# Patient Record
Sex: Male | Born: 1935 | Race: White | Hispanic: No | Marital: Married | Smoking: Former smoker
Health system: Southern US, Community
[De-identification: ages and names within clinical notes are randomized; demographics above are authoritative.]

## PROBLEM LIST (undated history)

## (undated) DIAGNOSIS — Z9889 Other specified postprocedural states: Secondary | ICD-10-CM

## (undated) DIAGNOSIS — E785 Hyperlipidemia, unspecified: Secondary | ICD-10-CM

## (undated) DIAGNOSIS — I82401 Acute embolism and thrombosis of unspecified deep veins of right lower extremity: Secondary | ICD-10-CM

## (undated) DIAGNOSIS — M1990 Unspecified osteoarthritis, unspecified site: Secondary | ICD-10-CM

## (undated) DIAGNOSIS — K219 Gastro-esophageal reflux disease without esophagitis: Secondary | ICD-10-CM

## (undated) DIAGNOSIS — J302 Other seasonal allergic rhinitis: Secondary | ICD-10-CM

## (undated) DIAGNOSIS — I10 Essential (primary) hypertension: Secondary | ICD-10-CM

## (undated) DIAGNOSIS — H3530 Unspecified macular degeneration: Secondary | ICD-10-CM

## (undated) DIAGNOSIS — M48061 Spinal stenosis, lumbar region without neurogenic claudication: Secondary | ICD-10-CM

## (undated) DIAGNOSIS — I6529 Occlusion and stenosis of unspecified carotid artery: Secondary | ICD-10-CM

## (undated) DIAGNOSIS — R112 Nausea with vomiting, unspecified: Secondary | ICD-10-CM

## (undated) DIAGNOSIS — N400 Enlarged prostate without lower urinary tract symptoms: Secondary | ICD-10-CM

## (undated) DIAGNOSIS — Z974 Presence of external hearing-aid: Secondary | ICD-10-CM

## (undated) DIAGNOSIS — I639 Cerebral infarction, unspecified: Secondary | ICD-10-CM

## (undated) DIAGNOSIS — M5126 Other intervertebral disc displacement, lumbar region: Secondary | ICD-10-CM

## (undated) HISTORY — PX: TONSILLECTOMY: SUR1361

## (undated) HISTORY — PX: ROTATOR CUFF REPAIR: SHX139

## (undated) HISTORY — PX: BACK SURGERY: SHX140

## (undated) HISTORY — PX: HERNIA REPAIR: SHX51

## (undated) HISTORY — PX: EYE SURGERY: SHX253

## (undated) HISTORY — PX: COLONOSCOPY: SHX174

---

## 2000-07-27 ENCOUNTER — Encounter (INDEPENDENT_AMBULATORY_CARE_PROVIDER_SITE_OTHER): Payer: Self-pay | Admitting: Specialist

## 2000-07-27 ENCOUNTER — Ambulatory Visit (HOSPITAL_COMMUNITY): Admission: RE | Admit: 2000-07-27 | Discharge: 2000-07-27 | Payer: Self-pay | Admitting: Gastroenterology

## 2003-11-28 ENCOUNTER — Ambulatory Visit (HOSPITAL_COMMUNITY): Admission: RE | Admit: 2003-11-28 | Discharge: 2003-11-30 | Payer: Self-pay | Admitting: Orthopaedic Surgery

## 2004-08-23 ENCOUNTER — Emergency Department (HOSPITAL_COMMUNITY): Admission: EM | Admit: 2004-08-23 | Discharge: 2004-08-23 | Payer: Self-pay | Admitting: Emergency Medicine

## 2006-05-08 ENCOUNTER — Emergency Department (HOSPITAL_COMMUNITY): Admission: EM | Admit: 2006-05-08 | Discharge: 2006-05-08 | Payer: Self-pay | Admitting: Emergency Medicine

## 2007-05-04 ENCOUNTER — Ambulatory Visit (HOSPITAL_COMMUNITY): Admission: RE | Admit: 2007-05-04 | Discharge: 2007-05-04 | Payer: Self-pay | Admitting: Orthopaedic Surgery

## 2007-05-17 ENCOUNTER — Encounter: Admission: RE | Admit: 2007-05-17 | Discharge: 2007-05-17 | Payer: Self-pay | Admitting: Orthopaedic Surgery

## 2007-05-31 ENCOUNTER — Encounter: Admission: RE | Admit: 2007-05-31 | Discharge: 2007-05-31 | Payer: Self-pay | Admitting: Orthopaedic Surgery

## 2008-06-26 ENCOUNTER — Encounter: Admission: RE | Admit: 2008-06-26 | Discharge: 2008-06-26 | Payer: Self-pay | Admitting: Orthopaedic Surgery

## 2008-07-10 ENCOUNTER — Encounter: Admission: RE | Admit: 2008-07-10 | Discharge: 2008-07-10 | Payer: Self-pay | Admitting: Orthopaedic Surgery

## 2010-05-27 ENCOUNTER — Emergency Department (HOSPITAL_COMMUNITY): Admission: EM | Admit: 2010-05-27 | Discharge: 2010-05-27 | Payer: Self-pay | Admitting: Emergency Medicine

## 2011-02-14 ENCOUNTER — Other Ambulatory Visit: Payer: Self-pay | Admitting: Urology

## 2011-02-14 DIAGNOSIS — N644 Mastodynia: Secondary | ICD-10-CM

## 2011-02-17 ENCOUNTER — Ambulatory Visit
Admission: RE | Admit: 2011-02-17 | Discharge: 2011-02-17 | Disposition: A | Discharge status: Home / Self Care | Payer: Medicare Other | Source: Ambulatory Visit | Attending: Urology | Admitting: Urology

## 2011-02-17 ENCOUNTER — Other Ambulatory Visit: Payer: Self-pay | Admitting: Urology

## 2011-02-17 DIAGNOSIS — N644 Mastodynia: Secondary | ICD-10-CM

## 2011-03-07 LAB — DIFFERENTIAL
Basophils Absolute: 0.1 10*3/uL (ref 0.0–0.1)
Basophils Relative: 0 % (ref 0–1)
Eosinophils Absolute: 0.1 10*3/uL (ref 0.0–0.7)
Eosinophils Relative: 1 % (ref 0–5)
Lymphocytes Relative: 8 % — ABNORMAL LOW (ref 12–46)
Lymphs Abs: 1.4 10*3/uL (ref 0.7–4.0)
Monocytes Absolute: 0.9 10*3/uL (ref 0.1–1.0)
Monocytes Relative: 5 % (ref 3–12)
Neutro Abs: 14.9 10*3/uL — ABNORMAL HIGH (ref 1.7–7.7)
Neutrophils Relative %: 86 % — ABNORMAL HIGH (ref 43–77)

## 2011-03-07 LAB — URINALYSIS, ROUTINE W REFLEX MICROSCOPIC
Bilirubin Urine: NEGATIVE
Glucose, UA: NEGATIVE mg/dL
Hgb urine dipstick: NEGATIVE
Ketones, ur: NEGATIVE mg/dL
Nitrite: NEGATIVE
Protein, ur: NEGATIVE mg/dL
Specific Gravity, Urine: 1.01 (ref 1.005–1.030)
Urobilinogen, UA: 0.2 mg/dL (ref 0.0–1.0)
pH: 6.5 (ref 5.0–8.0)

## 2011-03-07 LAB — POCT I-STAT, CHEM 8
BUN: 14 mg/dL (ref 6–23)
Calcium, Ion: 1.12 mmol/L (ref 1.12–1.32)
Chloride: 105 mEq/L (ref 96–112)
Creatinine, Ser: 0.8 mg/dL (ref 0.4–1.5)
Glucose, Bld: 91 mg/dL (ref 70–99)
HCT: 48 % (ref 39.0–52.0)
Hemoglobin: 16.3 g/dL (ref 13.0–17.0)
Potassium: 3.7 mEq/L (ref 3.5–5.1)
Sodium: 139 mEq/L (ref 135–145)
TCO2: 28 mmol/L (ref 0–100)

## 2011-03-07 LAB — CBC
HCT: 45 % (ref 39.0–52.0)
Hemoglobin: 15.5 g/dL (ref 13.0–17.0)
MCHC: 34.4 g/dL (ref 30.0–36.0)
MCV: 97.6 fL (ref 78.0–100.0)
Platelets: 256 10*3/uL (ref 150–400)
RBC: 4.61 MIL/uL (ref 4.22–5.81)
RDW: 12.9 % (ref 11.5–15.5)
WBC: 17.3 10*3/uL — ABNORMAL HIGH (ref 4.0–10.5)

## 2011-03-07 LAB — URINE MICROSCOPIC-ADD ON

## 2011-05-06 NOTE — Op Note (Signed)
Jay Tran, Jay Tran                           ACCOUNT NO.:  [id number]   MEDICAL RECORD NO.:  [id number]                   PATIENT TYPE:  OIB   LOCATION:  2899                                 FACILITY:  [facility]   PHYSICIAN:  Mark C. Ophelia Charter, M.D.                 DATE OF BIRTH:  1934/04/16   DATE OF PROCEDURE:  11/28/2003  DATE OF DISCHARGE:                                 OPERATIVE REPORT   PREOPERATIVE DIAGNOSIS:  Status post right rotator cuff repair with  infection.   PROCEDURE:  Right shoulder exploration, debridement of skin and subcutaneous  tissue, muscle, and bone.   SURGEON:  Mark C. Ophelia Charter, M.D.   ANESTHESIA:  GOT.   ESTIMATED BLOOD LOSS:  Less than 5 mL.   INDICATIONS FOR PROCEDURE:  This 77 year old male is status post rotator  cuff repair on October 27, 2003.  He originally tore his right shoulder  rotator cuff about 10 years ago, did well for a period of a year or two,  then had a repeat tear with accidental episode where he tried to grab a  large appliance as it was falling.  He had a repeat repair and had done well  up until 2004 when he had increased shoulder pain and MRI scan showed a  recurrent tear.  During his third repair which was on October 27, 2003,  after the repair was performed, the tissue patch was placed over the top.  In the postoperative period at three weeks out, he came in with some  prominence and puffiness of the incision.  Aspiration was performed.  Gram  stain was negative.  Cultures at 48 hours were negative.  He was placed on  Keflex.  He presented four days later with recurrence of golfball size  swelling and purulent material on aspiration.   DESCRIPTION OF PROCEDURE:  After induction of general anesthesia with the  patient in the Schlein shoulder holder frame, prepping with Duraprep with  the usual impervious stockinette, split sheets and drapes were applied.  Old  incision was opened and subcutaneous tissue immediately had some  purulent  serosanguineous type fluid with a little bit of subcutaneous necrosis which  appeared to be low grade infection. Deltoid was intact and after irrigation,  the repair of the deltoid to the acromion which was placed through drill  holes was carefully inspected.  The shoulder was rotated and there was no  drainage noted.  The sutures were cut loose from the deltoid and immediately  underneath this, there was again some slightly purulent material.  Weitlaner  was placed and inspection on top of the rotator cuff did not show the  previous tissue mend that had been sutured on top.  It had been placed with  Vicryl sutures and there appeared to be sort of a filmy layer over the top  of the rotator cuff that was scraped with a  curet and it appeared that some  of the pieces were tissue mend and these were sent for cultures.  Pulsatile  irrigation was then used.  The shoulder was rotated.  There was one single  area where there was a tear that was about 2 cm and sutures were present,  but the rotator cuff had pulled away from where the position had been  sutured down to the bone through a small trough and holes in the cortex.  These sutures were cut loose since there was a gap between the rotator cuff  and the tissue.  The joint was inspected with [employer] and there was no  purulent material.  The shoulder was taken through range of motion and  suctioning and with no infection evident inside the glenohumeral joint,  pulsatile lavage was used inside the joint.  Cartilage appeared normal.  With the possibility that the patient may have infection, repeat repair of  the rotator cuff was deferred.  3 liters of pulsatile lavage was used.  Hemovac  drain was placed and two simple Vicryl sutures were used to pull the deltoid  up to the acromion to keep it from retracting too severely.  Skin staple  closure, Adaptic, 4x4's, ABD, and tape.  Shoulder sling immobilizer was  applied and the patient was  transferred to the recovery room.                                               Mark C. Ophelia Charter, M.D.    MCY/MEDQ  D:  11/28/2003  T:  11/29/2003  Job:  [id number]

## 2011-05-06 NOTE — Procedures (Signed)
[facility]  Patient:    Jay Tran, Jay Tran                          MRN: [id number] Proc. Date: 07/27/00 Attending:  Verlin Tran, M.D. CC:         Jay Tran, M.D., [facility]  ,                           Procedure Report  REFERRING PHYSICIAN:  Hadassah Pais. Jeannetta Tran, M.D.  PROCEDURE:  Colonoscopy.  ENDOSCOPIST:  Jay Tran, M.D.  INDICATIONS FOR PROCEDURE:  The patient is a 77 year old male.  He submitted stool cards for Hemoccult testing to Dr. Windle Guard on June 23, 2000.  One out of six stool Hemoccult cards was positive for blood.  The patient is referred for diagnostic colonoscopy.  I discussed with the patient the complications associated with colonoscopy and polypectomy including intestinal bleeding and intestinal perforation.  The patient has signed the operative permit.  MEDICATION ALLERGIES:  None.  CHRONIC MEDICATIONS:  Hydrochlorothiazide and Zantac.  PAST MEDICAL HISTORY:  Hypertension, hypercholesterolemia, rotator cuff surgery, and herniorrhaphy.  FAMILY HISTORY:  Father died at age 40 of prostate cancer.  Mother died at age 39, stroke.  The patient has a brother 57 with bladder cancer.  PREMEDICATION:  Demerol 50 mg and Versed 5 mg.  ENDOSCOPE:  Olympus pediatric colonoscope.  DESCRIPTION OF PROCEDURE:  After obtaining informed consent, the patient was placed in the left lateral decubitus position.  I administered intravenous Demerol and intravenous Versed to achieve conscious sedation for the procedure.  The patients blood pressure, oxygen saturation, and cardiac rhythm were monitored throughout the procedure and documented in the medical record.  Anal inspection was normal.  Digital rectal exam revealed a non-nodular prostate.  The Olympus pediatric video colonoscope was introduced into the rectum and under direct vision advanced to the cecum as identified by a normal-appearing ileocecal valve.   Colonic preparation for the exam today was excellent.  The patient has universal colonic diverticulosis.  There is no evidence of diverticulitis.  Rectum normal.  Sigmoid colon and descending colon:  At 20 cm from the anal verge, a 0.5 mm sessile polyp was removed with the cold biopsy forceps and submitted for pathologic interpretation.  Splenic flexure normal.  Transverse colon normal.  Hepatic flexure normal.  Ascending colon normal.  Cecum and ileocecal valve normal.  ASSESSMENT: 1. Universal colonic diverticulosis. 2. At 20 cm from the anal verge, in the distal sigmoid colon, a 0.5 mm sessile    polyp was removed with the cold biopsy forceps.  RECOMMENDATIONS:  If the patients distal sigmoid colon polyp is neoplastic, the patient should undergo a repeat colonoscopy in five years; if the distal sigmoid colon polyp is nonneoplastic, he should undergo a repeat colonoscopy in 10 years. DD:  07/27/00 TD:  07/28/00 Job: [id number]

## 2012-05-11 ENCOUNTER — Other Ambulatory Visit: Payer: Self-pay | Admitting: Urology

## 2012-05-11 DIAGNOSIS — N62 Hypertrophy of breast: Secondary | ICD-10-CM

## 2012-05-17 ENCOUNTER — Ambulatory Visit
Admission: RE | Admit: 2012-05-17 | Discharge: 2012-05-17 | Disposition: A | Discharge status: Home / Self Care | Payer: Medicare Other | Source: Ambulatory Visit | Attending: Urology | Admitting: Urology

## 2012-05-17 DIAGNOSIS — N62 Hypertrophy of breast: Secondary | ICD-10-CM

## 2012-05-21 ENCOUNTER — Other Ambulatory Visit: Payer: Self-pay | Admitting: Neurology

## 2012-05-21 DIAGNOSIS — M21371 Foot drop, right foot: Secondary | ICD-10-CM

## 2012-05-21 DIAGNOSIS — M545 Low back pain, unspecified: Secondary | ICD-10-CM

## 2012-05-21 DIAGNOSIS — M25559 Pain in unspecified hip: Secondary | ICD-10-CM

## 2012-05-24 ENCOUNTER — Ambulatory Visit
Admission: RE | Admit: 2012-05-24 | Discharge: 2012-05-24 | Disposition: A | Discharge status: Home / Self Care | Payer: Medicare Other | Source: Ambulatory Visit | Attending: Neurology | Admitting: Neurology

## 2012-05-24 DIAGNOSIS — M25559 Pain in unspecified hip: Secondary | ICD-10-CM

## 2012-05-24 DIAGNOSIS — M545 Low back pain, unspecified: Secondary | ICD-10-CM

## 2012-05-24 DIAGNOSIS — M21371 Foot drop, right foot: Secondary | ICD-10-CM

## 2012-10-11 ENCOUNTER — Encounter (HOSPITAL_COMMUNITY): Payer: Self-pay | Admitting: Pharmacy Technician

## 2012-10-15 ENCOUNTER — Other Ambulatory Visit (HOSPITAL_COMMUNITY): Payer: Self-pay | Admitting: Orthopaedic Surgery

## 2012-10-17 ENCOUNTER — Encounter (HOSPITAL_COMMUNITY): Payer: Self-pay

## 2012-10-17 ENCOUNTER — Encounter (HOSPITAL_COMMUNITY)
Admission: RE | Admit: 2012-10-17 | Discharge: 2012-10-17 | Disposition: A | Discharge status: Home / Self Care | Payer: Medicare Other | Source: Ambulatory Visit | Attending: Orthopaedic Surgery | Admitting: Orthopaedic Surgery

## 2012-10-17 ENCOUNTER — Ambulatory Visit (HOSPITAL_COMMUNITY)
Admission: RE | Admit: 2012-10-17 | Discharge: 2012-10-17 | Disposition: A | Discharge status: Home / Self Care | Payer: Medicare Other | Source: Ambulatory Visit | Attending: Orthopaedic Surgery | Admitting: Orthopaedic Surgery

## 2012-10-17 DIAGNOSIS — Z01818 Encounter for other preprocedural examination: Secondary | ICD-10-CM | POA: Insufficient documentation

## 2012-10-17 DIAGNOSIS — Z01810 Encounter for preprocedural cardiovascular examination: Secondary | ICD-10-CM | POA: Insufficient documentation

## 2012-10-17 DIAGNOSIS — Z01812 Encounter for preprocedural laboratory examination: Secondary | ICD-10-CM | POA: Insufficient documentation

## 2012-10-17 HISTORY — DX: Essential (primary) hypertension: I10

## 2012-10-17 HISTORY — DX: Unspecified osteoarthritis, unspecified site: M19.90

## 2012-10-17 HISTORY — DX: Benign prostatic hyperplasia without lower urinary tract symptoms: N40.0

## 2012-10-17 HISTORY — DX: Other specified postprocedural states: Z98.890

## 2012-10-17 HISTORY — DX: Nausea with vomiting, unspecified: R11.2

## 2012-10-17 HISTORY — DX: Hyperlipidemia, unspecified: E78.5

## 2012-10-17 LAB — CBC
HCT: 43.1 % (ref 39.0–52.0)
Hemoglobin: 15.1 g/dL (ref 13.0–17.0)
MCH: 32.3 pg (ref 26.0–34.0)
MCHC: 35 g/dL (ref 30.0–36.0)
MCV: 92.3 fL (ref 78.0–100.0)
Platelets: 237 10*3/uL (ref 150–400)
RBC: 4.67 MIL/uL (ref 4.22–5.81)
RDW: 12.7 % (ref 11.5–15.5)
WBC: 7.6 10*3/uL (ref 4.0–10.5)

## 2012-10-17 LAB — SURGICAL PCR SCREEN
MRSA, PCR: NEGATIVE
Staphylococcus aureus: NEGATIVE

## 2012-10-17 LAB — COMPREHENSIVE METABOLIC PANEL
ALT: <5 U/L (ref 0–53)
AST: 22 U/L (ref 0–37)
Albumin: 4.2 g/dL (ref 3.5–5.2)
Alkaline Phosphatase: 45 U/L (ref 39–117)
BUN: 18 mg/dL (ref 6–23)
CO2: 28 mEq/L (ref 19–32)
Calcium: 9.4 mg/dL (ref 8.4–10.5)
Chloride: 98 mEq/L (ref 96–112)
Creatinine, Ser: 1.02 mg/dL (ref 0.50–1.35)
GFR calc Af Amer: 80 mL/min — ABNORMAL LOW (ref >90)
GFR calc non Af Amer: 69 mL/min — ABNORMAL LOW (ref >90)
Glucose, Bld: 88 mg/dL (ref 70–99)
Potassium: 3.8 mEq/L (ref 3.5–5.1)
Sodium: 135 mEq/L (ref 135–145)
Total Bilirubin: 0.8 mg/dL (ref 0.3–1.2)
Total Protein: 7.4 g/dL (ref 6.0–8.3)

## 2012-10-17 LAB — APTT: aPTT: 35 seconds (ref 24–37)

## 2012-10-17 LAB — PROTIME-INR
INR: 0.98 (ref 0.00–1.49)
Prothrombin Time: 12.9 seconds (ref 11.6–15.2)

## 2012-10-17 NOTE — Pre-Procedure Instructions (Signed)
[address] Deeney Mellone  10/17/2012   Your procedure is scheduled on:  Friday, November 8th  Report to [facility] Stay [facility] at 0530 AM.  Call this number if you have problems the morning of surgery: [phone]   Remember:   Do not eat food or drink:After Midnight.   Take these medicines the morning of surgery with A SIP OF WATER: proscar, flomax   Do not wear jewelry, make-up or nail polish.  Do not wear lotions, powders, or perfumes. .  Do not shave 48 hours prior to surgery. Men may shave face and neck.  Do not bring valuables to the hospital.  Contacts, dentures or bridgework may not be worn into surgery.  Leave suitcase in the car. After surgery it may be brought to your room.  For patients admitted to the hospital, checkout time is 11:00 AM the day of discharge.   Patients discharged the day of surgery will not be allowed to drive home.   Special Instructions: Shower using CHG 2 nights before surgery and the night before surgery.  If you shower the day of surgery use CHG.  Use special wash - you have one bottle of CHG for all showers.  You should use approximately 1/3 of the bottle for each shower.   Please read over the following fact sheets that you were given: Pain Booklet, Coughing and Deep Breathing, MRSA Information and Surgical Site Infection Prevention

## 2012-10-17 NOTE — Progress Notes (Signed)
Primary Physician - Dr. Jeannetta Nap - [place] Does not have a cardiologist - No previous cardiac work-up

## 2012-10-18 NOTE — Consult Note (Signed)
Anesthesia chart review: Patient is a 78 -year-old male scheduled for right lumbar 45 microdiscectomy on 10/26/2012 by Dr. Ophelia Charter. History includes postoperative nausea vomiting, arthritis, hyperlipidemia, BPH, hypertension, tonsillectomy, right rotator cuff repair '04, former smoker. PCP is Dr. Jeannetta Nap.  Labs noted.  Chest x-ray on 10/17/2012 showed no evidence of acute cardiopulmonary disease.  EKG on 10/17/2012 showed normal sinus rhythm, incomplete right bundle branch block. Currently there no comparison EKGs.  No CV symptoms documented at his PAT visit.  If remains asymptomatic then anticipate he can proceed as planned.  [name], PA-C

## 2012-10-25 MED ORDER — CEFAZOLIN SODIUM-DEXTROSE 2-3 GM-% IV SOLR
2.0000 g | INTRAVENOUS | Status: AC
  Administered 2012-10-26: 2 g via INTRAVENOUS
  Filled 2012-10-25: qty 50

## 2012-10-25 NOTE — H&P (Signed)
Jay Tran is an 78 y.o. male.   Chief Complaint: right leg pain and weakness, back pain HPI:Pt with several months of progressive back and leg left pain.  Previous ESI and NSAID treatment without relief of his symptoms   MRI scan previously showed broad based disk protrusion with lateral recess stenosis on the left consistent with his symptoms.  He has had at least 5 epidurals.  He has had radiofrequency ablation. will proceed with right L4-5 microdiscectomy with right lateral recess stenosis as he has failed conservative treatment.   Past Medical History  Diagnosis Date  . PONV (postoperative nausea and vomiting)   . Hypertension   . BPH (benign prostatic hypertrophy)   . Arthritis   . Hyperlipemia     Past Surgical History  Procedure Date  . Rotator cuff repair     right shoulder - 3 times  . Tonsillectomy     No family history on file. Social History:  reports that he has quit smoking. He does not have any smokeless tobacco history on file. He reports that he does not drink alcohol or use illicit drugs.  Allergies: No Known Allergies  No prescriptions prior to admission    No results found for this or any previous visit (from the past 48 hour(s)). No results found.  ROS  There were no vitals taken for this visit. Physical Exam  Constitutional: He is oriented to person, place, and time. He appears well-developed and well-nourished.  HENT:  Head: Normocephalic and atraumatic.  Eyes: EOM are normal. Pupils are equal, round, and reactive to light.  Neck: Normal range of motion. Neck supple.  Cardiovascular: Normal rate.   Respiratory: Effort normal.  GI: Soft.  Musculoskeletal:       PHYSICAL EXAMINATION:  Manual motor testing, anterior tib test strong.  When he walks he is not able to walk on his heels due to anterior tib weakness.  There is slight anterior compartment atrophy.  Decreased sensation over the dorsum of the foot.  He has sciatic notch tenderness.  Pain  with straight leg raising at 80 degrees on the right, negative on the left.    Neurological: He is alert and oriented to person, place, and time.  Skin: Skin is warm and dry.  Psychiatric: He has a normal mood and affect.     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  Micheil Klaus M 10/25/2012, 12:18 PM

## 2012-10-26 ENCOUNTER — Encounter (HOSPITAL_COMMUNITY)
Admission: RE | Disposition: A | Discharge status: Home / Self Care | Payer: Self-pay | Source: Ambulatory Visit | Attending: Orthopaedic Surgery

## 2012-10-26 ENCOUNTER — Ambulatory Visit (HOSPITAL_COMMUNITY): Payer: Medicare Other

## 2012-10-26 ENCOUNTER — Encounter (HOSPITAL_COMMUNITY): Payer: Self-pay | Admitting: Vascular Surgery

## 2012-10-26 ENCOUNTER — Encounter (HOSPITAL_COMMUNITY): Payer: Self-pay | Admitting: *Deleted

## 2012-10-26 ENCOUNTER — Ambulatory Visit (HOSPITAL_COMMUNITY): Payer: Medicare Other | Admitting: Vascular Surgery

## 2012-10-26 ENCOUNTER — Ambulatory Visit (HOSPITAL_COMMUNITY)
Admission: RE | Admit: 2012-10-26 | Discharge: 2012-10-27 | Disposition: A | Discharge status: Home / Self Care | Payer: Medicare Other | Source: Ambulatory Visit | Attending: Orthopaedic Surgery | Admitting: Orthopaedic Surgery

## 2012-10-26 DIAGNOSIS — E785 Hyperlipidemia, unspecified: Secondary | ICD-10-CM | POA: Insufficient documentation

## 2012-10-26 DIAGNOSIS — I10 Essential (primary) hypertension: Secondary | ICD-10-CM | POA: Insufficient documentation

## 2012-10-26 DIAGNOSIS — N400 Enlarged prostate without lower urinary tract symptoms: Secondary | ICD-10-CM | POA: Insufficient documentation

## 2012-10-26 DIAGNOSIS — M129 Arthropathy, unspecified: Secondary | ICD-10-CM | POA: Insufficient documentation

## 2012-10-26 DIAGNOSIS — M5126 Other intervertebral disc displacement, lumbar region: Secondary | ICD-10-CM | POA: Diagnosis present

## 2012-10-26 HISTORY — PX: LUMBAR LAMINECTOMY: SHX95

## 2012-10-26 SURGERY — MICRODISCECTOMY LUMBAR LAMINECTOMY
Anesthesia: General | Site: Back | Laterality: Right | Wound class: Clean

## 2012-10-26 MED ORDER — METHOCARBAMOL 500 MG PO TABS: 500.0000 mg | ORAL_TABLET | Freq: Four times a day (QID) | ORAL | Status: DC

## 2012-10-26 MED ORDER — PANTOPRAZOLE SODIUM 40 MG IV SOLR
40.0000 mg | Freq: Every day | INTRAVENOUS | Status: DC
  Administered 2012-10-26: 40 mg via INTRAVENOUS
  Filled 2012-10-26 (×2): qty 40

## 2012-10-26 MED ORDER — NEOSTIGMINE METHYLSULFATE 1 MG/ML IJ SOLN
INTRAMUSCULAR | Status: DC | PRN
  Administered 2012-10-26: 2 mg via INTRAVENOUS
  Administered 2012-10-26: 3 mg via INTRAVENOUS

## 2012-10-26 MED ORDER — MORPHINE SULFATE 2 MG/ML IJ SOLN: 1.0000 mg | INTRAMUSCULAR | Status: DC | PRN

## 2012-10-26 MED ORDER — KETOROLAC TROMETHAMINE 30 MG/ML IJ SOLN
15.0000 mg | Freq: Three times a day (TID) | INTRAMUSCULAR | Status: AC
  Administered 2012-10-26 – 2012-10-27 (×3): 15 mg via INTRAVENOUS
  Filled 2012-10-26 (×2): qty 1

## 2012-10-26 MED ORDER — FENTANYL CITRATE 0.05 MG/ML IJ SOLN
INTRAMUSCULAR | Status: DC | PRN
  Administered 2012-10-26: 100 ug via INTRAVENOUS

## 2012-10-26 MED ORDER — HYDROCHLOROTHIAZIDE 25 MG PO TABS
25.0000 mg | ORAL_TABLET | Freq: Every day | ORAL | Status: DC
  Administered 2012-10-27: 25 mg via ORAL
  Filled 2012-10-26: qty 1

## 2012-10-26 MED ORDER — KETOROLAC TROMETHAMINE 30 MG/ML IJ SOLN
INTRAMUSCULAR | Status: AC
  Filled 2012-10-26: qty 1

## 2012-10-26 MED ORDER — MIDAZOLAM HCL 5 MG/5ML IJ SOLN
INTRAMUSCULAR | Status: DC | PRN
  Administered 2012-10-26: 2 mg via INTRAVENOUS

## 2012-10-26 MED ORDER — FINASTERIDE 5 MG PO TABS
5.0000 mg | ORAL_TABLET | ORAL | Status: DC
  Filled 2012-10-26: qty 1

## 2012-10-26 MED ORDER — DEXAMETHASONE SODIUM PHOSPHATE 4 MG/ML IJ SOLN
INTRAMUSCULAR | Status: DC | PRN
  Administered 2012-10-26: 10 mg via INTRAVENOUS

## 2012-10-26 MED ORDER — ONDANSETRON HCL 4 MG/2ML IJ SOLN
INTRAMUSCULAR | Status: DC | PRN
  Administered 2012-10-26: 4 mg via INTRAVENOUS

## 2012-10-26 MED ORDER — SCOPOLAMINE 1 MG/3DAYS TD PT72
MEDICATED_PATCH | TRANSDERMAL | Status: AC
  Filled 2012-10-26: qty 1

## 2012-10-26 MED ORDER — GLYCOPYRROLATE 0.2 MG/ML IJ SOLN
INTRAMUSCULAR | Status: DC | PRN
  Administered 2012-10-26: .6 mg via INTRAVENOUS
  Administered 2012-10-26: 0.2 mg via INTRAVENOUS
  Administered 2012-10-26: 0.4 mg via INTRAVENOUS
  Administered 2012-10-26: 0.2 mg via INTRAVENOUS

## 2012-10-26 MED ORDER — BUPIVACAINE HCL (PF) 0.25 % IJ SOLN
INTRAMUSCULAR | Status: AC
  Filled 2012-10-26: qty 30

## 2012-10-26 MED ORDER — METHOCARBAMOL 500 MG PO TABS
500.0000 mg | ORAL_TABLET | Freq: Four times a day (QID) | ORAL | Status: DC | PRN
  Administered 2012-10-26 – 2012-10-27 (×4): 500 mg via ORAL
  Filled 2012-10-26 (×4): qty 1

## 2012-10-26 MED ORDER — BISACODYL 10 MG RE SUPP: 10.0000 mg | Freq: Every day | RECTAL | Status: DC | PRN

## 2012-10-26 MED ORDER — EPHEDRINE SULFATE 50 MG/ML IJ SOLN
INTRAMUSCULAR | Status: DC | PRN
  Administered 2012-10-26: 10 mg via INTRAVENOUS

## 2012-10-26 MED ORDER — ACETAMINOPHEN 325 MG PO TABS: 650.0000 mg | ORAL_TABLET | ORAL | Status: DC | PRN

## 2012-10-26 MED ORDER — LACTATED RINGERS IV SOLN
INTRAVENOUS | Status: DC | PRN
  Administered 2012-10-26 (×2): via INTRAVENOUS

## 2012-10-26 MED ORDER — SODIUM CHLORIDE 0.9 % IJ SOLN: 3.0000 mL | INTRAMUSCULAR | Status: DC | PRN

## 2012-10-26 MED ORDER — KCL IN DEXTROSE-NACL 20-5-0.45 MEQ/L-%-% IV SOLN
INTRAVENOUS | Status: DC
  Administered 2012-10-26 (×2): via INTRAVENOUS
  Filled 2012-10-26 (×3): qty 1000

## 2012-10-26 MED ORDER — ACETAMINOPHEN 650 MG RE SUPP: 650.0000 mg | RECTAL | Status: DC | PRN

## 2012-10-26 MED ORDER — PHENOL 1.4 % MT LIQD: 1.0000 | OROMUCOSAL | Status: DC | PRN

## 2012-10-26 MED ORDER — ZOLPIDEM TARTRATE 5 MG PO TABS: 5.0000 mg | ORAL_TABLET | Freq: Every evening | ORAL | Status: DC | PRN

## 2012-10-26 MED ORDER — CEFAZOLIN SODIUM 1-5 GM-% IV SOLN
1.0000 g | Freq: Three times a day (TID) | INTRAVENOUS | Status: AC
  Administered 2012-10-26 (×2): 1 g via INTRAVENOUS
  Filled 2012-10-26 (×2): qty 50

## 2012-10-26 MED ORDER — METHOCARBAMOL 100 MG/ML IJ SOLN
500.0000 mg | Freq: Once | INTRAVENOUS | Status: AC
  Administered 2012-10-26: 500 mg via INTRAVENOUS
  Filled 2012-10-26: qty 5

## 2012-10-26 MED ORDER — HYDROMORPHONE HCL PF 1 MG/ML IJ SOLN
INTRAMUSCULAR | Status: AC
  Filled 2012-10-26: qty 1

## 2012-10-26 MED ORDER — HYDROCODONE-ACETAMINOPHEN 5-325 MG PO TABS
1.0000 | ORAL_TABLET | ORAL | Status: DC | PRN
  Administered 2012-10-26 – 2012-10-27 (×4): 1 via ORAL
  Filled 2012-10-26 (×3): qty 1
  Filled 2012-10-26: qty 2

## 2012-10-26 MED ORDER — PROPOFOL 10 MG/ML IV BOLUS
INTRAVENOUS | Status: DC | PRN
  Administered 2012-10-26: 125 mg via INTRAVENOUS

## 2012-10-26 MED ORDER — SENNOSIDES-DOCUSATE SODIUM 8.6-50 MG PO TABS: 1.0000 | ORAL_TABLET | Freq: Every evening | ORAL | Status: DC | PRN

## 2012-10-26 MED ORDER — ROCURONIUM BROMIDE 100 MG/10ML IV SOLN
INTRAVENOUS | Status: DC | PRN
  Administered 2012-10-26: 50 mg via INTRAVENOUS

## 2012-10-26 MED ORDER — SODIUM CHLORIDE 0.9 % IV SOLN: 250.0000 mL | INTRAVENOUS | Status: DC

## 2012-10-26 MED ORDER — ONDANSETRON HCL 4 MG/2ML IJ SOLN: 4.0000 mg | INTRAMUSCULAR | Status: DC | PRN

## 2012-10-26 MED ORDER — METHOCARBAMOL 100 MG/ML IJ SOLN
500.0000 mg | Freq: Four times a day (QID) | INTRAVENOUS | Status: DC | PRN
  Filled 2012-10-26: qty 5

## 2012-10-26 MED ORDER — FLEET ENEMA 7-19 GM/118ML RE ENEM: 1.0000 | ENEMA | Freq: Once | RECTAL | Status: AC | PRN

## 2012-10-26 MED ORDER — HEMOSTATIC AGENTS (NO CHARGE) OPTIME
TOPICAL | Status: DC | PRN
  Administered 2012-10-26: 1 via TOPICAL

## 2012-10-26 MED ORDER — OXYCODONE-ACETAMINOPHEN 5-325 MG PO TABS: 1.0000 | ORAL_TABLET | ORAL | Status: DC | PRN

## 2012-10-26 MED ORDER — HYDROMORPHONE HCL PF 1 MG/ML IJ SOLN
0.2500 mg | INTRAMUSCULAR | Status: DC | PRN
  Administered 2012-10-26 (×2): 0.5 mg via INTRAVENOUS

## 2012-10-26 MED ORDER — KCL IN DEXTROSE-NACL 20-5-0.45 MEQ/L-%-% IV SOLN
INTRAVENOUS | Status: AC
  Administered 2012-10-26: 1000 mL
  Filled 2012-10-26: qty 1000

## 2012-10-26 MED ORDER — ONDANSETRON HCL 4 MG/2ML IJ SOLN: 4.0000 mg | Freq: Once | INTRAMUSCULAR | Status: DC | PRN

## 2012-10-26 MED ORDER — BUPIVACAINE HCL (PF) 0.25 % IJ SOLN
INTRAMUSCULAR | Status: DC | PRN
  Administered 2012-10-26: 10 mL

## 2012-10-26 MED ORDER — SCOPOLAMINE 1 MG/3DAYS TD PT72
MEDICATED_PATCH | TRANSDERMAL | Status: DC | PRN
  Administered 2012-10-26: 1 via TRANSDERMAL

## 2012-10-26 MED ORDER — TAMSULOSIN HCL 0.4 MG PO CAPS
0.4000 mg | ORAL_CAPSULE | ORAL | Status: DC
  Filled 2012-10-26: qty 1

## 2012-10-26 MED ORDER — ACETAMINOPHEN 10 MG/ML IV SOLN: 1000.0000 mg | Freq: Once | INTRAVENOUS | Status: DC | PRN

## 2012-10-26 MED ORDER — SODIUM CHLORIDE 0.9 % IJ SOLN
3.0000 mL | Freq: Two times a day (BID) | INTRAMUSCULAR | Status: DC
  Administered 2012-10-26: 3 mL via INTRAVENOUS

## 2012-10-26 MED ORDER — ALUM & MAG HYDROXIDE-SIMETH 200-200-20 MG/5ML PO SUSP
30.0000 mL | ORAL | Status: DC | PRN
  Administered 2012-10-26: 30 mL via ORAL
  Filled 2012-10-26 (×2): qty 30

## 2012-10-26 MED ORDER — 0.9 % SODIUM CHLORIDE (POUR BTL) OPTIME
TOPICAL | Status: DC | PRN
  Administered 2012-10-26: 1000 mL

## 2012-10-26 MED ORDER — LIDOCAINE HCL (CARDIAC) 20 MG/ML IV SOLN
INTRAVENOUS | Status: DC | PRN
  Administered 2012-10-26: 100 mg via INTRAVENOUS

## 2012-10-26 MED ORDER — DOCUSATE SODIUM 100 MG PO CAPS
100.0000 mg | ORAL_CAPSULE | Freq: Two times a day (BID) | ORAL | Status: DC
  Administered 2012-10-26 – 2012-10-27 (×2): 100 mg via ORAL
  Filled 2012-10-26 (×3): qty 1

## 2012-10-26 MED ORDER — MENTHOL 3 MG MT LOZG: 1.0000 | LOZENGE | OROMUCOSAL | Status: DC | PRN

## 2012-10-26 SURGICAL SUPPLY — 47 items
BENZOIN TINCTURE PRP APPL 2/3 (GAUZE/BANDAGES/DRESSINGS) ×2 IMPLANT
BUR ROUND FLUTED 4 SOFT TCH (BURR) IMPLANT
CANISTER SUCTION 2500CC (MISCELLANEOUS) ×2 IMPLANT
CLOTH BEACON ORANGE TIMEOUT ST (SAFETY) ×2 IMPLANT
CORDS BIPOLAR (ELECTRODE) ×2 IMPLANT
COVER SURGICAL LIGHT HANDLE (MISCELLANEOUS) ×2 IMPLANT
DERMABOND ADVANCED (GAUZE/BANDAGES/DRESSINGS) ×1
DERMABOND ADVANCED .7 DNX12 (GAUZE/BANDAGES/DRESSINGS) ×1 IMPLANT
DRAPE MICROSCOPE LEICA (MISCELLANEOUS) ×2 IMPLANT
DRAPE PROXIMA HALF (DRAPES) ×4 IMPLANT
DRSG EMULSION OIL 3X3 NADH (GAUZE/BANDAGES/DRESSINGS) ×2 IMPLANT
DRSG MEPILEX BORDER 4X4 (GAUZE/BANDAGES/DRESSINGS) ×2 IMPLANT
DURAPREP 26ML APPLICATOR (WOUND CARE) ×2 IMPLANT
ELECT REM PT RETURN 9FT ADLT (ELECTROSURGICAL) ×2
ELECTRODE REM PT RTRN 9FT ADLT (ELECTROSURGICAL) ×1 IMPLANT
GLOVE BIOGEL PI IND STRL 7.5 (GLOVE) ×1 IMPLANT
GLOVE BIOGEL PI IND STRL 8 (GLOVE) ×1 IMPLANT
GLOVE BIOGEL PI INDICATOR 7.5 (GLOVE) ×1
GLOVE BIOGEL PI INDICATOR 8 (GLOVE) ×1
GLOVE ECLIPSE 7.0 STRL STRAW (GLOVE) ×2 IMPLANT
GLOVE ORTHO TXT STRL SZ7.5 (GLOVE) ×2 IMPLANT
GOWN PREVENTION PLUS LG XLONG (DISPOSABLE) IMPLANT
GOWN STRL NON-REIN LRG LVL3 (GOWN DISPOSABLE) ×6 IMPLANT
KIT BASIN OR (CUSTOM PROCEDURE TRAY) ×2 IMPLANT
KIT ROOM TURNOVER OR (KITS) ×2 IMPLANT
MANIFOLD NEPTUNE II (INSTRUMENTS) IMPLANT
NDL SUT .5 MAYO 1.404X.05X (NEEDLE) IMPLANT
NEEDLE HYPO 25GX1X1/2 BEV (NEEDLE) ×2 IMPLANT
NEEDLE MAYO TAPER (NEEDLE)
NEEDLE SPNL 18GX3.5 QUINCKE PK (NEEDLE) ×2 IMPLANT
NS IRRIG 1000ML POUR BTL (IV SOLUTION) ×2 IMPLANT
PACK LAMINECTOMY ORTHO (CUSTOM PROCEDURE TRAY) ×2 IMPLANT
PAD ARMBOARD 7.5X6 YLW CONV (MISCELLANEOUS) ×4 IMPLANT
PATTIES SURGICAL .5 X.5 (GAUZE/BANDAGES/DRESSINGS) IMPLANT
PATTIES SURGICAL .75X.75 (GAUZE/BANDAGES/DRESSINGS) IMPLANT
SPONGE GAUZE 4X4 12PLY (GAUZE/BANDAGES/DRESSINGS) ×2 IMPLANT
STRIP CLOSURE SKIN 1/2X4 (GAUZE/BANDAGES/DRESSINGS) IMPLANT
SUT VIC AB 2-0 CT1 27 (SUTURE) ×1
SUT VIC AB 2-0 CT1 TAPERPNT 27 (SUTURE) ×1 IMPLANT
SUT VICRYL 0 TIES 12 18 (SUTURE) ×2 IMPLANT
SUT VICRYL 4-0 PS2 18IN ABS (SUTURE) IMPLANT
SUT VICRYL AB 2 0 TIES (SUTURE) ×2 IMPLANT
SYR 20ML ECCENTRIC (SYRINGE) IMPLANT
SYR CONTROL 10ML LL (SYRINGE) ×2 IMPLANT
TOWEL OR 17X24 6PK STRL BLUE (TOWEL DISPOSABLE) ×2 IMPLANT
TOWEL OR 17X26 10 PK STRL BLUE (TOWEL DISPOSABLE) ×2 IMPLANT
WATER STERILE IRR 1000ML POUR (IV SOLUTION) IMPLANT

## 2012-10-26 NOTE — Anesthesia Procedure Notes (Signed)
Procedure Name: Intubation Date/Time: 10/26/2012 7:53 AM Performed by: Rossie Muskrat L Pre-anesthesia Checklist: Patient identified, Timeout performed, Emergency Drugs available, Suction available and Patient being monitored Patient Re-evaluated:Patient Re-evaluated prior to inductionOxygen Delivery Method: Circle system utilized Preoxygenation: Pre-oxygenation with 100% oxygen Intubation Type: IV induction Ventilation: Mask ventilation without difficulty Laryngoscope Size: Miller and 2 Grade View: Grade I Tube type: Oral Tube size: 7.5 mm Number of attempts: 1 Airway Equipment and Method: Stylet Placement Confirmation: ETT inserted through vocal cords under direct vision,  breath sounds checked- equal and bilateral and positive ETCO2 Secured at: 23 cm Tube secured with: Tape Dental Injury: Teeth and Oropharynx as per pre-operative assessment

## 2012-10-26 NOTE — Anesthesia Postprocedure Evaluation (Signed)
  Anesthesia Post-op Note  Patient: Jay Tran  Procedure(s) Performed: Procedure(s) (LRB) with comments: MICRODISCECTOMY LUMBAR LAMINECTOMY (Right) - Right L4-5 Microdiscectomy  Patient Location: PACU  Anesthesia Type:General  Level of Consciousness: awake, alert  and oriented  Airway and Oxygen Therapy: Patient Spontanous Breathing and Patient connected to nasal cannula oxygen  Post-op Pain: mild  Post-op Assessment: Post-op Vital signs reviewed and Patient's Cardiovascular Status Stable  Post-op Vital Signs: stable  Complications: No apparent anesthesia complications

## 2012-10-26 NOTE — Anesthesia Preprocedure Evaluation (Addendum)
Anesthesia Evaluation  Patient identified by MRN, date of birth, ID band Patient awake    Reviewed: Allergy & Precautions, H&P , NPO status , Patient's Chart, lab work & pertinent test results  History of Anesthesia Complications (+) PONV  Airway Mallampati: II TM Distance: >3 FB Neck ROM: full    Dental  (+) Teeth Intact and Dental Advidsory Given   Pulmonary former smoker (quit 1964),  breath sounds clear to auscultation        Cardiovascular hypertension, Pt. on medications Rhythm:Regular Rate:Normal     Neuro/Psych    GI/Hepatic   Endo/Other    Renal/GU      Musculoskeletal   Abdominal   Peds  Hematology   Anesthesia Other Findings   Reproductive/Obstetrics                          Anesthesia Physical Anesthesia Plan  ASA: III  Anesthesia Plan: General   Post-op Pain Management:    Induction: Intravenous  Airway Management Planned: Oral ETT  Additional Equipment:   Intra-op Plan:   Post-operative Plan: Extubation in OR  Informed Consent: I have reviewed the patients History and Physical, chart, labs and discussed the procedure including the risks, benefits and alternatives for the proposed anesthesia with the patient or authorized representative who has indicated his/her understanding and acceptance.   Dental advisory given  Plan Discussed with: CRNA and Surgeon  Anesthesia Plan Comments: (HNP L4-5 Htn H/O Post-op N/V  Plan GA with oral ETT  Kipp Brood, MD)        Anesthesia Quick Evaluation

## 2012-10-26 NOTE — Preoperative (Signed)
Beta Blockers   Reason not to administer Beta Blockers:Not Applicable 

## 2012-10-26 NOTE — Interval H&P Note (Signed)
History and Physical Interval Note:  10/26/2012 7:44 AM  Jay Tran  has presented today for surgery, with the diagnosis of Right L4-5 HNP  The various methods of treatment have been discussed with the patient and family. After consideration of risks, benefits and other options for treatment, the patient has consented to  Procedure(s) (LRB) with comments: MICRODISCECTOMY LUMBAR LAMINECTOMY (Right) - Right L4-5 Microdiscectomy as a surgical intervention .  The patient's history has been reviewed, patient examined, no change in status, stable for surgery.  I have reviewed the patient's chart and labs.  Questions were answered to the patient's satisfaction.     Meda Dudzinski C

## 2012-10-26 NOTE — Transfer of Care (Signed)
Immediate Anesthesia Transfer of Care Note  Patient: Jay Tran  Procedure(s) Performed: Procedure(s) (LRB) with comments: MICRODISCECTOMY LUMBAR LAMINECTOMY (Right) - Right L4-5 Microdiscectomy  Patient Location: PACU  Anesthesia Type:General  Level of Consciousness: awake, alert , oriented and patient cooperative  Airway & Oxygen Therapy: Patient Spontanous Breathing and Patient connected to face mask oxygen  Post-op Assessment: Report given to PACU RN, Post -op Vital signs reviewed and stable and Patient moving all extremities  Post vital signs: Reviewed and stable  Complications: No apparent anesthesia complications

## 2012-10-26 NOTE — Op Note (Signed)
Preop diagnosis: Right L4-5 HNP with foraminal stenosis  Postop diagnosis: Same  Procedure: Right L4-5 microdiscectomy, foraminotomy.  Surgeon: Annell Greening M.D.  Asst.: Maud Deed PA-C. medically necessary present for the entire procedure  Anesthesia GLT plus Marcaine local  EBL less than 100 cc  Operative procedure after into induction of general anesthesia oral tracheal intubation patient was placed prone on chest rolls careful padding and positioning yellow foam rolls underneath the shoulders arms at 9090 pads over the ulnar nerve back was prepped with DuraPrep the area square workhouse Betadine Steri-Drape applied and laminectomy sheet and draped timeout procedure was completed preoperative Ancef was given prophylactically.  Spinal needle was placed at L4-5 based on palpable landmarks and crosstable lateral x-ray was taken to confirm appropriate placement. Incision was made just right of the millimeters of midline subperiosteal dissection out laterally talar tract are placed laterally 5 pound weight and #4 Penfield placed at either aspect of the lamina at the 13 [place] confirmed with a repeat second x-ray. Laminotomy was performed the tensor ligament were removed the disc was bulging it was incised with the 15 blade using the to return to protect the dura. There was mild amount of bone overhang primarily thick ligaments and bulging disc with some bone upsweep at the endplates causing the lateral recess and foraminal narrowing. Chunks of disc were removed somewhere at the midline which then allowed the the [facility] easily pulled the nerve root closer toward the midline and hockey-stick visit we passed underneath recently anterior to the nerve root and out the foramen. Bone was removed at the level of the pedicle and passes were made with the up straight down biting pituitaries Epstein curettes were used. Bipolar cautery was used in the gutter to cauterize multiple veins. Irrigation with saline  solution and then standard closure with #1 Vicryl 2-0 Vicryl subcutaneous tissue and then skin closure subcuticular and postop dressing.  Signed Annell Greening M.D.

## 2012-10-26 NOTE — Brief Op Note (Cosign Needed)
10/26/2012  9:01 AM  PATIENT:  Jay Tran  78 y.o. male  PRE-OPERATIVE DIAGNOSIS:  Right L4-5 HNP  POST-OPERATIVE DIAGNOSIS:  Right L4-5 HNP  PROCEDURE:  Procedure(s) (LRB) with comments: MICRODISCECTOMY LUMBAR LAMINECTOMY (Right) - Right L4-5 Microdiscectomy  SURGEON:  Surgeon(s) and Role:    * Eldred Manges, MD - Primary  PHYSICIAN ASSISTANT: Maud Deed [facility]  ASSISTANTS: none   ANESTHESIA:   local and general  EBL:  Total I/O In: 1000 [I.V.:1000] Out: -   BLOOD ADMINISTERED:none  DRAINS: none   LOCAL MEDICATIONS USED:  MARCAINE     SPECIMEN:  No Specimen  DISPOSITION OF SPECIMEN:  N/A  COUNTS:  YES  TOURNIQUET:  * No tourniquets in log *  DICTATION: .Note written in EPIC  PLAN OF CARE: Admit for overnight observation  PATIENT DISPOSITION:  PACU - hemodynamically stable.   Delay start of Pharmacological VTE agent (>24hrs) due to surgical blood loss or risk of bleeding: yes

## 2012-10-27 NOTE — Progress Notes (Signed)
Subjective: 1 Day Post-Op Procedure(s) (LRB): MICRODISCECTOMY LUMBAR LAMINECTOMY (Right) Patient reports pain as mild.    Objective: Vital signs in last 24 hours: Temp:  [97.3 F (36.3 C)-98.4 F (36.9 C)] 98.3 F (36.8 C) (11/09 0619) Pulse Rate:  [55-82] 55  (11/09 0619) Resp:  [5-17] 17  (11/09 0619) BP: (118-134)/(55-69) 128/57 mmHg (11/09 0619) SpO2:  [92 %-97 %] 96 % (11/09 0619)  Intake/Output from previous day: 11/08 0701 - 11/09 0700 In: 1695 [P.O.:240; I.V.:1400; IV Piggyback:55] Out: -  Intake/Output this shift:    No results found for this basename: HGB:5 in the last 72 hours No results found for this basename: WBC:2,RBC:2,HCT:2,PLT:2 in the last 72 hours No results found for this basename: NA:2,K:2,CL:2,CO2:2,BUN:2,CREATININE:2,GLUCOSE:2,CALCIUM:2 in the last 72 hours No results found for this basename: LABPT:2,INR:2 in the last 72 hours  Neurologically intact Incision: no drainage  Assessment/Plan: 1 Day Post-Op Procedure(s) (LRB): MICRODISCECTOMY LUMBAR LAMINECTOMY (Right) Up with therapy Discharge to home today.  BLACKMAN,CHRISTOPHER Y 10/27/2012, 10:29 AM

## 2012-10-27 NOTE — Discharge Summary (Signed)
Patient ID: Jay Tran MRN: [id number] DOB/AGE: 05/25/1934 78 y.o.  Admit date: 10/26/2012 Discharge date: 10/27/2012  Admission Diagnoses:  Principal Problem:  *HNP (herniated nucleus pulposus), lumbar   Discharge Diagnoses:  Same  Past Medical History  Diagnosis Date  . PONV (postoperative nausea and vomiting)   . Hypertension   . BPH (benign prostatic hypertrophy)   . Arthritis   . Hyperlipemia     Surgeries: Procedure(s): MICRODISCECTOMY LUMBAR LAMINECTOMY on 10/26/2012   Consultants:    Discharged Condition: Improved  Hospital Course: AAZIM FIGG is an 78 y.o. male who was admitted 10/26/2012 for operative treatment ofHNP (herniated nucleus pulposus), lumbar. Patient has severe unremitting pain that affects sleep, daily activities, and work/hobbies. After pre-op clearance the patient was taken to the operating room on 10/26/2012 and underwent  Procedure(s): MICRODISCECTOMY LUMBAR LAMINECTOMY.    Patient was given perioperative antibiotics: Anti-infectives     Start     Dose/Rate Route Frequency Ordered Stop   10/26/12 1600   ceFAZolin (ANCEF) IVPB 1 g/50 mL premix        1 g 100 mL/hr over 30 Minutes Intravenous Every 8 hours 10/26/12 1211 10/26/12 2343   10/25/12 1451   ceFAZolin (ANCEF) IVPB 2 g/50 mL premix        2 g 100 mL/hr over 30 Minutes Intravenous 60 min pre-op 10/25/12 1451 10/26/12 0743           Patient was given sequential compression devices, early ambulation, and chemoprophylaxis to prevent DVT.  Patient benefited maximally from hospital stay and there were no complications.    Recent vital signs: Patient Vitals for the past 24 hrs:  BP Temp Temp src Pulse Resp SpO2  10/27/12 0619 128/57 mmHg 98.3 F (36.8 C) - 55  17  96 %  10/27/12 0206 124/55 mmHg 98.2 F (36.8 C) - 82  16  97 %  10/26/12 2130 130/59 mmHg 98.4 F (36.9 C) Oral 63  14  94 %  10/26/12 1206 118/55 mmHg 98.4 F (36.9 C) Oral 72  - 93 %  10/26/12 1145 134/68 mmHg  97.7 F (36.5 C) - 69  5  94 %  10/26/12 1130 134/69 mmHg - - 71  14  93 %  10/26/12 1115 123/65 mmHg - - 74  12  93 %  10/26/12 1100 127/65 mmHg - - 72  15  93 %  10/26/12 1045 127/58 mmHg 97.3 F (36.3 C) - 74  15  92 %     Recent laboratory studies: No results found for this basename: WBC:2,HGB:2,HCT:2,PLT:2,NA:2,K:2,CL:2,CO2:2,BUN:2,CREATININE:2,GLUCOSE:2,PT:2,INR:2,CALCIUM,2: in the last 72 hours   Discharge Medications:     Medication List     As of 10/27/2012 10:32 AM    TAKE these medications         finasteride 5 MG tablet   Commonly known as: PROSCAR   Take 5 mg by mouth every other day.      Fish Oil 300 MG Caps   Take 600 mg by mouth daily.      hydrochlorothiazide 50 MG tablet   Commonly known as: HYDRODIURIL   Take 25 mg by mouth daily.      methocarbamol 500 MG tablet   Commonly known as: ROBAXIN   Take 1 tablet (500 mg total) by mouth 4 (four) times daily.      naproxen sodium 220 MG tablet   Commonly known as: ANAPROX   Take 440 mg by mouth 2 (two) times daily with a meal.  niacin 500 MG tablet   Take 1,000 mg by mouth 2 (two) times daily.      oxyCODONE-acetaminophen 5-325 MG per tablet   Commonly known as: PERCOCET/ROXICET   Take 1 tablet by mouth every 4 (four) hours as needed for pain.      Tamsulosin HCl 0.4 MG Caps   Commonly known as: FLOMAX   Take 0.4 mg by mouth every other day.        Diagnostic Studies: Dg Chest 2 View  10/17/2012  *RADIOLOGY REPORT*  Clinical Data: Preop lower back surgery  CHEST - 2 VIEW  Comparison: 08/23/2004  Findings: Lungs are essentially clear. No pleural effusion or pneumothorax.  The heart is top normal in size.  Mild degenerative changes of the visualized thoracolumbar spine.  IMPRESSION: No evidence of acute cardiopulmonary disease.   Original Report Authenticated By: Charline Bills, M.D.    Dg Lumbar Spine 2-3 Views  10/26/2012  *RADIOLOGY REPORT*  Clinical Data: L4-5 microdiskectomy  LUMBAR SPINE  - 2-3 VIEW  Comparison: 01/21/2012 MRI  Findings: Needle localization hardware projects posterior to the L4- 5 facet joint. Subsequent image shows additional hardware projecting posterior to the L4-5 level. Multilevel degenerative changes.  No acute osseous finding.  IMPRESSION: Surgical localization hardware projects posterior to the L4-5 level.   Original Report Authenticated By: Jearld Lesch, M.D.     Disposition: to home      Discharge Orders    Future Orders Please Complete By Expires   Diet - low sodium heart healthy      Call MD / Call 911      Comments:   If you experience chest pain or shortness of breath, CALL 911 and be transported to the hospital emergency room.  If you develope a fever above 101 F, pus (white drainage) or increased drainage or redness at the wound, or calf pain, call your surgeon's office.   Constipation Prevention      Comments:   Drink plenty of fluids.  Prune juice may be helpful.  You may use a stool softener, such as Colace (over the counter) 100 mg twice a day.  Use MiraLax (over the counter) for constipation as needed.   Increase activity slowly as tolerated      Discharge instructions      Comments:   No lifting greater than 10 lbs. Avoid bending, stooping and twisting. Walk in house for first week them may start to get out slowly increasing distance up to one mile by 3 weeks post op. Keep incision dry for 3 days, may use tegaderm or similar water impervious dressing.   Lifting restrictions      Comments:   No lifting   Driving restrictions      Comments:   No driving   Discharge patient         Follow-up Information    Follow up with YATES,MARK C, MD. Schedule an appointment as soon as possible for a visit in 2 weeks.   Contact information:   [address] [place] [address] [phone]           Signed: Kathryne Hitch 10/27/2012, 10:32 AM

## 2012-10-27 NOTE — Progress Notes (Signed)
D/C instructions reviewed with patient and wife. RX x 2 given. No hh services or equipment needed. All questions answered. Pt d/c'ed via wheelchair in stable condition 

## 2012-10-29 ENCOUNTER — Ambulatory Visit (HOSPITAL_COMMUNITY)
Admission: RE | Admit: 2012-10-29 | Discharge: 2012-10-29 | Disposition: A | Discharge status: Home / Self Care | Payer: Medicare Other | Source: Ambulatory Visit | Attending: Family Medicine | Admitting: Family Medicine

## 2012-10-29 ENCOUNTER — Encounter (HOSPITAL_COMMUNITY): Payer: Self-pay | Admitting: Orthopaedic Surgery

## 2012-10-29 ENCOUNTER — Other Ambulatory Visit (HOSPITAL_COMMUNITY): Payer: Self-pay | Admitting: *Deleted

## 2012-10-29 DIAGNOSIS — R609 Edema, unspecified: Secondary | ICD-10-CM

## 2012-10-29 DIAGNOSIS — I82409 Acute embolism and thrombosis of unspecified deep veins of unspecified lower extremity: Secondary | ICD-10-CM

## 2012-10-29 DIAGNOSIS — R52 Pain, unspecified: Secondary | ICD-10-CM

## 2012-10-29 DIAGNOSIS — M79609 Pain in unspecified limb: Secondary | ICD-10-CM | POA: Insufficient documentation

## 2012-10-29 DIAGNOSIS — M7989 Other specified soft tissue disorders: Secondary | ICD-10-CM | POA: Insufficient documentation

## 2012-10-29 NOTE — Progress Notes (Signed)
VASCULAR LAB PRELIMINARY  PRELIMINARY  PRELIMINARY  PRELIMINARY  Right lower extremity venous duplex completed.    Preliminary report:  3 to 4 inch segment of deep vein thrombosis noted in the anterior tibial vein at mid lateral calf.  No other deep or superficial vein thrombosis noted.  Report called to Toniann Fail in office.  Instructions given to patient per Toniann Fail:  Have patient take 1 full dose ASA (325) every day, elevate right leg with slight bend in leg, decrease activity, rest and ice back.  Patient is to return to Dr. Ophelia Charter on Thursday and will be reevaluated at that time.  If he or wife has any questions, call Toniann Fail at office.  Jay Tran, 10/29/2012, 3:08 PM

## 2012-11-27 ENCOUNTER — Other Ambulatory Visit: Payer: Self-pay | Admitting: Orthopaedic Surgery

## 2012-11-27 DIAGNOSIS — R609 Edema, unspecified: Secondary | ICD-10-CM

## 2012-11-27 DIAGNOSIS — R52 Pain, unspecified: Secondary | ICD-10-CM

## 2012-11-28 ENCOUNTER — Ambulatory Visit
Admission: RE | Admit: 2012-11-28 | Discharge: 2012-11-28 | Disposition: A | Discharge status: Home / Self Care | Payer: Medicare Other | Source: Ambulatory Visit | Attending: Orthopaedic Surgery | Admitting: Orthopaedic Surgery

## 2012-11-28 DIAGNOSIS — R52 Pain, unspecified: Secondary | ICD-10-CM

## 2012-11-28 DIAGNOSIS — R609 Edema, unspecified: Secondary | ICD-10-CM

## 2013-02-13 ENCOUNTER — Other Ambulatory Visit: Payer: Self-pay | Admitting: Orthopaedic Surgery

## 2013-02-13 ENCOUNTER — Other Ambulatory Visit: Payer: Medicare Other

## 2013-02-13 DIAGNOSIS — M79604 Pain in right leg: Secondary | ICD-10-CM

## 2013-04-02 ENCOUNTER — Other Ambulatory Visit: Payer: Self-pay | Admitting: Neurological Surgery

## 2013-04-02 DIAGNOSIS — IMO0002 Reserved for concepts with insufficient information to code with codable children: Secondary | ICD-10-CM

## 2013-04-08 ENCOUNTER — Ambulatory Visit
Admission: RE | Admit: 2013-04-08 | Discharge: 2013-04-08 | Disposition: A | Discharge status: Home / Self Care | Payer: Medicare Other | Source: Ambulatory Visit | Attending: Neurological Surgery | Admitting: Neurological Surgery

## 2013-04-08 DIAGNOSIS — IMO0002 Reserved for concepts with insufficient information to code with codable children: Secondary | ICD-10-CM

## 2013-04-08 MED ORDER — GADOBENATE DIMEGLUMINE 529 MG/ML IV SOLN
18.0000 mL | Freq: Once | INTRAVENOUS | Status: AC | PRN
  Administered 2013-04-08: 18 mL via INTRAVENOUS

## 2013-04-11 ENCOUNTER — Other Ambulatory Visit: Payer: Self-pay | Admitting: Neurological Surgery

## 2013-04-19 ENCOUNTER — Encounter (HOSPITAL_COMMUNITY): Payer: Self-pay | Admitting: Pharmacy Technician

## 2013-04-22 NOTE — Pre-Procedure Instructions (Signed)
Jay Tran  04/22/2013   Your procedure is scheduled on:  Wednesday May 01, 2013.  Report to [facility] 3rd Floor at 8:30 AM (per Physician request).  Call this number if you have problems the morning of surgery: [phone]   Remember:   Do not eat food or drink liquids after midnight.   Take these medicines the morning of surgery with A SIP OF WATER: Gabapentin (Neurontin), Ranitidine (Zantac) if needed for heartburn                                                                           Discontinue aspirin and herbal medications  (Omega-3 Fatty Acids (FISH OIL) 300 MG CAPS), 7 days prior to your surgery.              Do not wear jewelry  Do not wear lotions or colognes.  Men may shave face and neck.  Do not bring valuables to the hospital.  Contacts, dentures or bridgework may not be worn into surgery.                                 Leave suitcase in the car. After surgery it may be brought to your room.  For patients admitted to the hospital, checkout time is 11:00 AM the day of discharge.   Patients discharged the day of surgery will not be allowed to drive home.  Name and phone number of your driver: Family/Friend  Special Instructions: Shower using CHG 2 nights before surgery and the night before surgery.  If you shower the day of surgery use CHG.  Use special wash - you have one bottle of CHG for all showers.  You should use approximately 1/3 of the bottle for each shower.   Please read over the following fact sheets that you were given: Pain Booklet, Coughing and Deep Breathing, Blood Transfusion Information, MRSA Information and Surgical Site Infection Prevention

## 2013-04-23 ENCOUNTER — Encounter (HOSPITAL_COMMUNITY): Payer: Self-pay

## 2013-04-23 ENCOUNTER — Encounter (HOSPITAL_COMMUNITY)
Admission: RE | Admit: 2013-04-23 | Discharge: 2013-04-23 | Disposition: A | Discharge status: Home / Self Care | Payer: Medicare Other | Source: Ambulatory Visit | Attending: Neurological Surgery | Admitting: Neurological Surgery

## 2013-04-23 HISTORY — DX: Presence of external hearing-aid: Z97.4

## 2013-04-23 HISTORY — DX: Other seasonal allergic rhinitis: J30.2

## 2013-04-23 HISTORY — DX: Acute embolism and thrombosis of unspecified deep veins of right lower extremity: I82.401

## 2013-04-23 LAB — BASIC METABOLIC PANEL
BUN: 15 mg/dL (ref 6–23)
CO2: 29 mEq/L (ref 19–32)
Calcium: 10.1 mg/dL (ref 8.4–10.5)
Chloride: 97 mEq/L (ref 96–112)
Creatinine, Ser: 0.94 mg/dL (ref 0.50–1.35)
GFR calc Af Amer: >90 mL/min (ref >90)
GFR calc non Af Amer: 79 mL/min — ABNORMAL LOW (ref >90)
Glucose, Bld: 96 mg/dL (ref 70–99)
Potassium: 3.4 mEq/L — ABNORMAL LOW (ref 3.5–5.1)
Sodium: 135 mEq/L (ref 135–145)

## 2013-04-23 LAB — CBC
HCT: 43 % (ref 39.0–52.0)
Hemoglobin: 15.7 g/dL (ref 13.0–17.0)
MCH: 32.6 pg (ref 26.0–34.0)
MCHC: 36.5 g/dL — ABNORMAL HIGH (ref 30.0–36.0)
MCV: 89.4 fL (ref 78.0–100.0)
Platelets: 249 10*3/uL (ref 150–400)
RBC: 4.81 MIL/uL (ref 4.22–5.81)
RDW: 12.6 % (ref 11.5–15.5)
WBC: 5.8 10*3/uL (ref 4.0–10.5)

## 2013-04-23 LAB — SURGICAL PCR SCREEN
MRSA, PCR: NEGATIVE
Staphylococcus aureus: NEGATIVE

## 2013-04-23 LAB — ABO/RH: ABO/RH(D): A POS

## 2013-04-23 LAB — TYPE AND SCREEN
ABO/RH(D): A POS
Antibody Screen: NEGATIVE

## 2013-04-23 NOTE — Progress Notes (Signed)
Pt denies SOB, chest pain, and being under the care of a cardiologist. Spoke with Revonda Standard( the PA) regarding pt EKG dated 09/30/2102. No new order given; according to [facility], EKG okay.

## 2013-04-30 MED ORDER — CEFAZOLIN SODIUM-DEXTROSE 2-3 GM-% IV SOLR
2.0000 g | INTRAVENOUS | Status: AC
  Administered 2013-05-01: 2 g via INTRAVENOUS
  Administered 2013-05-01: 1 g via INTRAVENOUS
  Filled 2013-04-30: qty 50

## 2013-05-01 ENCOUNTER — Encounter (HOSPITAL_COMMUNITY): Payer: Self-pay | Admitting: *Deleted

## 2013-05-01 ENCOUNTER — Inpatient Hospital Stay (HOSPITAL_COMMUNITY): Payer: Medicare Other

## 2013-05-01 ENCOUNTER — Encounter (HOSPITAL_COMMUNITY): Payer: Self-pay | Admitting: Vascular Surgery

## 2013-05-01 ENCOUNTER — Encounter (HOSPITAL_COMMUNITY)
Admission: RE | Disposition: A | Discharge status: Home / Self Care | Payer: Self-pay | Source: Ambulatory Visit | Attending: Neurological Surgery

## 2013-05-01 ENCOUNTER — Inpatient Hospital Stay (HOSPITAL_COMMUNITY)
Admission: RE | Admit: 2013-05-01 | Discharge: 2013-05-03 | DRG: 460 | Disposition: A | Discharge status: Home / Self Care | Payer: Medicare Other | Source: Ambulatory Visit | Attending: Neurological Surgery | Admitting: Neurological Surgery

## 2013-05-01 ENCOUNTER — Inpatient Hospital Stay (HOSPITAL_COMMUNITY): Payer: Medicare Other | Admitting: *Deleted

## 2013-05-01 DIAGNOSIS — N401 Enlarged prostate with lower urinary tract symptoms: Secondary | ICD-10-CM | POA: Diagnosis present

## 2013-05-01 DIAGNOSIS — Z87891 Personal history of nicotine dependence: Secondary | ICD-10-CM

## 2013-05-01 DIAGNOSIS — I10 Essential (primary) hypertension: Secondary | ICD-10-CM | POA: Diagnosis present

## 2013-05-01 DIAGNOSIS — Z79899 Other long term (current) drug therapy: Secondary | ICD-10-CM

## 2013-05-01 DIAGNOSIS — Z8042 Family history of malignant neoplasm of prostate: Secondary | ICD-10-CM

## 2013-05-01 DIAGNOSIS — Z01812 Encounter for preprocedural laboratory examination: Secondary | ICD-10-CM

## 2013-05-01 DIAGNOSIS — R338 Other retention of urine: Secondary | ICD-10-CM | POA: Diagnosis present

## 2013-05-01 DIAGNOSIS — Q762 Congenital spondylolisthesis: Secondary | ICD-10-CM

## 2013-05-01 DIAGNOSIS — E785 Hyperlipidemia, unspecified: Secondary | ICD-10-CM | POA: Diagnosis present

## 2013-05-01 DIAGNOSIS — Z981 Arthrodesis status: Secondary | ICD-10-CM

## 2013-05-01 DIAGNOSIS — Z823 Family history of stroke: Secondary | ICD-10-CM

## 2013-05-01 DIAGNOSIS — N138 Other obstructive and reflux uropathy: Secondary | ICD-10-CM | POA: Diagnosis present

## 2013-05-01 DIAGNOSIS — M216X9 Other acquired deformities of unspecified foot: Secondary | ICD-10-CM | POA: Diagnosis present

## 2013-05-01 DIAGNOSIS — Z86718 Personal history of other venous thrombosis and embolism: Secondary | ICD-10-CM

## 2013-05-01 DIAGNOSIS — M5126 Other intervertebral disc displacement, lumbar region: Principal | ICD-10-CM | POA: Diagnosis present

## 2013-05-01 SURGERY — FOR MAXIMUM ACCESS (MAS) POSTERIOR LUMBAR INTERBODY FUSION (PLIF) 2 LEVEL
Anesthesia: General | Site: Back | Wound class: Clean

## 2013-05-01 MED ORDER — BACITRACIN 50000 UNITS IM SOLR
INTRAMUSCULAR | Status: AC
  Filled 2013-05-01: qty 1

## 2013-05-01 MED ORDER — LIDOCAINE HCL (CARDIAC) 20 MG/ML IV SOLN
INTRAVENOUS | Status: DC | PRN
  Administered 2013-05-01: 100 mg via INTRAVENOUS

## 2013-05-01 MED ORDER — HYDROMORPHONE HCL PF 1 MG/ML IJ SOLN
INTRAMUSCULAR | Status: AC
  Filled 2013-05-01: qty 1

## 2013-05-01 MED ORDER — POTASSIUM CHLORIDE IN NACL 20-0.9 MEQ/L-% IV SOLN
INTRAVENOUS | Status: DC
  Administered 2013-05-01: 19:00:00 via INTRAVENOUS
  Filled 2013-05-01 (×5): qty 1000

## 2013-05-01 MED ORDER — METHOCARBAMOL 100 MG/ML IJ SOLN
500.0000 mg | Freq: Four times a day (QID) | INTRAVENOUS | Status: DC | PRN
  Filled 2013-05-01: qty 5

## 2013-05-01 MED ORDER — ONDANSETRON HCL 4 MG/2ML IJ SOLN
INTRAMUSCULAR | Status: DC | PRN
  Administered 2013-05-01: 4 mg via INTRAVENOUS

## 2013-05-01 MED ORDER — MORPHINE SULFATE 2 MG/ML IJ SOLN
1.0000 mg | INTRAMUSCULAR | Status: DC | PRN
  Administered 2013-05-01: 4 mg via INTRAVENOUS
  Filled 2013-05-01: qty 2

## 2013-05-01 MED ORDER — LISINOPRIL 10 MG PO TABS: 10.0000 mg | ORAL_TABLET | Freq: Every morning | ORAL | Status: DC

## 2013-05-01 MED ORDER — TAMSULOSIN HCL 0.4 MG PO CAPS
0.4000 mg | ORAL_CAPSULE | Freq: Every evening | ORAL | Status: DC
  Administered 2013-05-01 – 2013-05-02 (×2): 0.4 mg via ORAL
  Filled 2013-05-01 (×3): qty 1

## 2013-05-01 MED ORDER — NIACIN ER 500 MG PO CPCR: 1000.0000 mg | ORAL_CAPSULE | Freq: Two times a day (BID) | ORAL | Status: DC

## 2013-05-01 MED ORDER — PANTOPRAZOLE SODIUM 40 MG IV SOLR
40.0000 mg | Freq: Every day | INTRAVENOUS | Status: DC
  Administered 2013-05-01: 40 mg via INTRAVENOUS
  Filled 2013-05-01 (×2): qty 40

## 2013-05-01 MED ORDER — OXYCODONE-ACETAMINOPHEN 5-325 MG PO TABS
1.0000 | ORAL_TABLET | ORAL | Status: DC | PRN
  Administered 2013-05-01 – 2013-05-02 (×2): 2 via ORAL
  Filled 2013-05-01 (×2): qty 2

## 2013-05-01 MED ORDER — DIPHENHYDRAMINE HCL 50 MG/ML IJ SOLN: 25.0000 mg | Freq: Four times a day (QID) | INTRAMUSCULAR | Status: DC | PRN

## 2013-05-01 MED ORDER — CEFAZOLIN SODIUM 1-5 GM-% IV SOLN
1.0000 g | Freq: Three times a day (TID) | INTRAVENOUS | Status: AC
  Administered 2013-05-01 – 2013-05-02 (×2): 1 g via INTRAVENOUS
  Filled 2013-05-01 (×2): qty 50

## 2013-05-01 MED ORDER — ZOLPIDEM TARTRATE 5 MG PO TABS: 5.0000 mg | ORAL_TABLET | Freq: Every evening | ORAL | Status: DC | PRN

## 2013-05-01 MED ORDER — ACETAMINOPHEN 325 MG PO TABS: 650.0000 mg | ORAL_TABLET | ORAL | Status: DC | PRN

## 2013-05-01 MED ORDER — MIDAZOLAM HCL 5 MG/5ML IJ SOLN
INTRAMUSCULAR | Status: DC | PRN
  Administered 2013-05-01: 2 mg via INTRAVENOUS

## 2013-05-01 MED ORDER — SCOPOLAMINE 1 MG/3DAYS TD PT72
MEDICATED_PATCH | TRANSDERMAL | Status: AC
  Filled 2013-05-01: qty 1

## 2013-05-01 MED ORDER — FINASTERIDE 5 MG PO TABS
5.0000 mg | ORAL_TABLET | Freq: Every evening | ORAL | Status: DC
  Administered 2013-05-01 – 2013-05-02 (×2): 5 mg via ORAL
  Filled 2013-05-01 (×3): qty 1

## 2013-05-01 MED ORDER — DIPHENHYDRAMINE HCL 25 MG PO CAPS
25.0000 mg | ORAL_CAPSULE | Freq: Four times a day (QID) | ORAL | Status: DC | PRN
  Administered 2013-05-01 – 2013-05-03 (×4): 25 mg via ORAL
  Filled 2013-05-01 (×4): qty 1

## 2013-05-01 MED ORDER — ONDANSETRON HCL 4 MG/2ML IJ SOLN
4.0000 mg | Freq: Once | INTRAMUSCULAR | Status: DC | PRN
Start: 2013-05-01 — End: 2013-05-01

## 2013-05-01 MED ORDER — FENTANYL CITRATE 0.05 MG/ML IJ SOLN
INTRAMUSCULAR | Status: DC | PRN
  Administered 2013-05-01: 100 ug via INTRAVENOUS
  Administered 2013-05-01 (×2): 50 ug via INTRAVENOUS
  Administered 2013-05-01: 100 ug via INTRAVENOUS
  Administered 2013-05-01: 50 ug via INTRAVENOUS

## 2013-05-01 MED ORDER — SODIUM CHLORIDE 0.9 % IR SOLN
Status: DC | PRN
  Administered 2013-05-01: 12:00:00

## 2013-05-01 MED ORDER — OXYCODONE HCL 5 MG PO TABS
5.0000 mg | ORAL_TABLET | Freq: Once | ORAL | Status: AC | PRN
  Administered 2013-05-01: 5 mg via ORAL

## 2013-05-01 MED ORDER — GABAPENTIN 100 MG PO CAPS
100.0000 mg | ORAL_CAPSULE | Freq: Two times a day (BID) | ORAL | Status: DC
  Administered 2013-05-01 – 2013-05-03 (×4): 100 mg via ORAL
  Filled 2013-05-01 (×5): qty 1

## 2013-05-01 MED ORDER — LISINOPRIL 10 MG PO TABS
10.0000 mg | ORAL_TABLET | Freq: Every morning | ORAL | Status: DC
  Administered 2013-05-01 – 2013-05-03 (×3): 10 mg via ORAL
  Filled 2013-05-01 (×3): qty 1

## 2013-05-01 MED ORDER — SODIUM CHLORIDE 0.9 % IJ SOLN: 3.0000 mL | INTRAMUSCULAR | Status: DC | PRN

## 2013-05-01 MED ORDER — DEXAMETHASONE SODIUM PHOSPHATE 4 MG/ML IJ SOLN
4.0000 mg | Freq: Four times a day (QID) | INTRAMUSCULAR | Status: DC
  Administered 2013-05-02: 4 mg via INTRAVENOUS
  Filled 2013-05-01 (×9): qty 1

## 2013-05-01 MED ORDER — SENNA 8.6 MG PO TABS
1.0000 | ORAL_TABLET | Freq: Two times a day (BID) | ORAL | Status: DC
  Administered 2013-05-01 – 2013-05-03 (×4): 8.6 mg via ORAL
  Filled 2013-05-01 (×6): qty 1

## 2013-05-01 MED ORDER — THROMBIN 5000 UNITS EX SOLR
OROMUCOSAL | Status: DC | PRN
  Administered 2013-05-01: 14:00:00 via TOPICAL

## 2013-05-01 MED ORDER — SODIUM CHLORIDE 0.9 % IV SOLN
INTRAVENOUS | Status: AC
  Filled 2013-05-01: qty 500

## 2013-05-01 MED ORDER — NIACIN ER 500 MG PO CPCR
1000.0000 mg | ORAL_CAPSULE | Freq: Two times a day (BID) | ORAL | Status: DC
  Administered 2013-05-01 – 2013-05-03 (×4): 1000 mg via ORAL
  Filled 2013-05-01 (×6): qty 2

## 2013-05-01 MED ORDER — OXYCODONE HCL 5 MG/5ML PO SOLN: 5.0000 mg | Freq: Once | ORAL | Status: AC | PRN

## 2013-05-01 MED ORDER — MENTHOL 3 MG MT LOZG: 1.0000 | LOZENGE | OROMUCOSAL | Status: DC | PRN

## 2013-05-01 MED ORDER — HYDROMORPHONE HCL PF 1 MG/ML IJ SOLN
0.2500 mg | INTRAMUSCULAR | Status: DC | PRN
Start: 2013-05-01 — End: 2013-05-01
  Administered 2013-05-01: 1 mg via INTRAVENOUS

## 2013-05-01 MED ORDER — ACETAMINOPHEN 650 MG RE SUPP: 650.0000 mg | RECTAL | Status: DC | PRN

## 2013-05-01 MED ORDER — ONDANSETRON HCL 4 MG/2ML IJ SOLN: 4.0000 mg | INTRAMUSCULAR | Status: DC | PRN

## 2013-05-01 MED ORDER — SUCCINYLCHOLINE CHLORIDE 20 MG/ML IJ SOLN
INTRAMUSCULAR | Status: DC | PRN
  Administered 2013-05-01: 120 mg via INTRAVENOUS

## 2013-05-01 MED ORDER — SODIUM CHLORIDE 0.9 % IJ SOLN
3.0000 mL | Freq: Two times a day (BID) | INTRAMUSCULAR | Status: DC
  Administered 2013-05-02 – 2013-05-03 (×3): 3 mL via INTRAVENOUS

## 2013-05-01 MED ORDER — METHOCARBAMOL 500 MG PO TABS
500.0000 mg | ORAL_TABLET | Freq: Four times a day (QID) | ORAL | Status: DC | PRN
  Administered 2013-05-01: 500 mg via ORAL

## 2013-05-01 MED ORDER — NIACIN 500 MG PO TABS
1000.0000 mg | ORAL_TABLET | Freq: Two times a day (BID) | ORAL | Status: DC
Start: 2013-05-01 — End: 2013-05-01
  Filled 2013-05-01: qty 2

## 2013-05-01 MED ORDER — ACETAMINOPHEN 10 MG/ML IV SOLN
1000.0000 mg | Freq: Four times a day (QID) | INTRAVENOUS | Status: AC
  Administered 2013-05-01 – 2013-05-02 (×4): 1000 mg via INTRAVENOUS
  Filled 2013-05-01 (×5): qty 100

## 2013-05-01 MED ORDER — OXYCODONE HCL 5 MG PO TABS
ORAL_TABLET | ORAL | Status: AC
  Filled 2013-05-01: qty 1

## 2013-05-01 MED ORDER — HYDROCHLOROTHIAZIDE 50 MG PO TABS
50.0000 mg | ORAL_TABLET | Freq: Every day | ORAL | Status: DC
  Administered 2013-05-01 – 2013-05-03 (×3): 50 mg via ORAL
  Filled 2013-05-01 (×3): qty 1

## 2013-05-01 MED ORDER — MEPERIDINE HCL 25 MG/ML IJ SOLN: 6.2500 mg | INTRAMUSCULAR | Status: DC | PRN

## 2013-05-01 MED ORDER — DEXAMETHASONE 4 MG PO TABS
4.0000 mg | ORAL_TABLET | Freq: Four times a day (QID) | ORAL | Status: DC
  Administered 2013-05-01 – 2013-05-03 (×7): 4 mg via ORAL
  Filled 2013-05-01 (×10): qty 1

## 2013-05-01 MED ORDER — PHENOL 1.4 % MT LIQD: 1.0000 | OROMUCOSAL | Status: DC | PRN

## 2013-05-01 MED ORDER — BUPIVACAINE HCL (PF) 0.25 % IJ SOLN
INTRAMUSCULAR | Status: DC | PRN
  Administered 2013-05-01: 6 mL

## 2013-05-01 MED ORDER — EPHEDRINE SULFATE 50 MG/ML IJ SOLN
INTRAMUSCULAR | Status: DC | PRN
  Administered 2013-05-01 (×3): 5 mg via INTRAVENOUS
  Administered 2013-05-01: 10 mg via INTRAVENOUS

## 2013-05-01 MED ORDER — CELECOXIB 200 MG PO CAPS
200.0000 mg | ORAL_CAPSULE | Freq: Two times a day (BID) | ORAL | Status: DC
  Filled 2013-05-01 (×5): qty 1

## 2013-05-01 MED ORDER — CEFAZOLIN SODIUM 1-5 GM-% IV SOLN
INTRAVENOUS | Status: AC
  Filled 2013-05-01: qty 50

## 2013-05-01 MED ORDER — LACTATED RINGERS IV SOLN
INTRAVENOUS | Status: DC | PRN
  Administered 2013-05-01 (×4): via INTRAVENOUS

## 2013-05-01 MED ORDER — HEMOSTATIC AGENTS (NO CHARGE) OPTIME
TOPICAL | Status: DC | PRN
  Administered 2013-05-01: 1 via TOPICAL

## 2013-05-01 MED ORDER — SCOPOLAMINE 1 MG/3DAYS TD PT72
MEDICATED_PATCH | TRANSDERMAL | Status: DC | PRN
  Administered 2013-05-01: 1 via TRANSDERMAL

## 2013-05-01 MED ORDER — THROMBIN 20000 UNITS EX KIT
PACK | CUTANEOUS | Status: DC | PRN
  Administered 2013-05-01: 20000 [IU] via TOPICAL

## 2013-05-01 MED ORDER — METHOCARBAMOL 500 MG PO TABS
ORAL_TABLET | ORAL | Status: AC
  Filled 2013-05-01: qty 1

## 2013-05-01 MED ORDER — PROPOFOL 10 MG/ML IV BOLUS
INTRAVENOUS | Status: DC | PRN
  Administered 2013-05-01: 120 mg via INTRAVENOUS

## 2013-05-01 SURGICAL SUPPLY — 65 items
BAG DECANTER FOR FLEXI CONT (MISCELLANEOUS) ×2 IMPLANT
BENZOIN TINCTURE PRP APPL 2/3 (GAUZE/BANDAGES/DRESSINGS) ×2 IMPLANT
BLADE SURG ROTATE 9660 (MISCELLANEOUS) IMPLANT
BONE MATRIX OSTEOCEL PRO LRG (Bone Implant) ×2 IMPLANT
BUR MATCHSTICK NEURO 3.0 LAGG (BURR) ×2 IMPLANT
CAGE COROENT 10X9X23 (Cage) ×4 IMPLANT
CAGE MAS PLIF 14X23X4 (Cage) ×4 IMPLANT
CANISTER SUCTION 2500CC (MISCELLANEOUS) ×2 IMPLANT
CLIP NEUROVISION LG (CLIP) ×2 IMPLANT
CLOTH BEACON ORANGE TIMEOUT ST (SAFETY) ×2 IMPLANT
CONT SPEC 4OZ CLIKSEAL STRL BL (MISCELLANEOUS) ×4 IMPLANT
COVER BACK TABLE 24X17X13 BIG (DRAPES) IMPLANT
COVER TABLE BACK 60X90 (DRAPES) ×2 IMPLANT
DRAPE C-ARM 42X72 X-RAY (DRAPES) ×2 IMPLANT
DRAPE C-ARMOR (DRAPES) ×2 IMPLANT
DRAPE LAPAROTOMY 100X72X124 (DRAPES) ×2 IMPLANT
DRAPE POUCH INSTRU U-SHP 10X18 (DRAPES) ×2 IMPLANT
DRAPE SURG 17X23 STRL (DRAPES) ×2 IMPLANT
DRESSING TELFA 8X3 (GAUZE/BANDAGES/DRESSINGS) ×2 IMPLANT
DRSG OPSITE 4X5.5 SM (GAUZE/BANDAGES/DRESSINGS) ×2 IMPLANT
DURAPREP 26ML APPLICATOR (WOUND CARE) ×2 IMPLANT
ELECT REM PT RETURN 9FT ADLT (ELECTROSURGICAL) ×2
ELECTRODE REM PT RTRN 9FT ADLT (ELECTROSURGICAL) ×1 IMPLANT
EVACUATOR 1/8 PVC DRAIN (DRAIN) ×2 IMPLANT
GAUZE SPONGE 4X4 16PLY XRAY LF (GAUZE/BANDAGES/DRESSINGS) IMPLANT
GLOVE BIOGEL M 8.0 STRL (GLOVE) ×2 IMPLANT
GLOVE BIOGEL PI IND STRL 7.5 (GLOVE) ×2 IMPLANT
GLOVE BIOGEL PI IND STRL 8 (GLOVE) ×2 IMPLANT
GLOVE BIOGEL PI INDICATOR 7.5 (GLOVE) ×2
GLOVE BIOGEL PI INDICATOR 8 (GLOVE) ×2
GLOVE ECLIPSE 7.5 STRL STRAW (GLOVE) ×2 IMPLANT
GLOVE SURG SS PI 7.0 STRL IVOR (GLOVE) ×6 IMPLANT
GLOVE SURG SS PI 8.0 STRL IVOR (GLOVE) ×2 IMPLANT
GOWN BRE IMP SLV AUR LG STRL (GOWN DISPOSABLE) IMPLANT
GOWN BRE IMP SLV AUR XL STRL (GOWN DISPOSABLE) ×4 IMPLANT
GOWN STRL REIN 2XL LVL4 (GOWN DISPOSABLE) ×6 IMPLANT
HEMOSTAT POWDER KIT SURGIFOAM (HEMOSTASIS) IMPLANT
KIT BASIN OR (CUSTOM PROCEDURE TRAY) ×2 IMPLANT
KIT NEEDLE NVM5 EMG ELECT (KITS) ×1 IMPLANT
KIT NEEDLE NVM5 EMG ELECTRODE (KITS) ×1
KIT ROOM TURNOVER OR (KITS) ×2 IMPLANT
MILL MEDIUM DISP (BLADE) ×2 IMPLANT
NEEDLE HYPO 25X1 1.5 SAFETY (NEEDLE) ×2 IMPLANT
NS IRRIG 1000ML POUR BTL (IV SOLUTION) ×2 IMPLANT
PACK LAMINECTOMY NEURO (CUSTOM PROCEDURE TRAY) ×2 IMPLANT
PAD ARMBOARD 7.5X6 YLW CONV (MISCELLANEOUS) ×6 IMPLANT
ROD PLIF MAS 65MM (Rod) ×4 IMPLANT
SCREW LOCK (Screw) ×6 IMPLANT
SCREW LOCK FXNS SPNE MAS PL (Screw) ×6 IMPLANT
SCREW PLIF MAS 5.5X35 LUMBAR (Screw) ×4 IMPLANT
SCREW SHANK 5.0X35 (Screw) ×4 IMPLANT
SCREW SHANK 5.5X40MM (Screw) ×4 IMPLANT
SCREW TULIP 5.5 (Screw) ×8 IMPLANT
SPONGE LAP 4X18 X RAY DECT (DISPOSABLE) IMPLANT
SPONGE SURGIFOAM ABS GEL 100 (HEMOSTASIS) ×2 IMPLANT
STRIP CLOSURE SKIN 1/2X4 (GAUZE/BANDAGES/DRESSINGS) ×2 IMPLANT
SUT VIC AB 0 CT1 18XCR BRD8 (SUTURE) ×1 IMPLANT
SUT VIC AB 0 CT1 8-18 (SUTURE) ×1
SUT VIC AB 2-0 CP2 18 (SUTURE) ×2 IMPLANT
SUT VIC AB 3-0 SH 8-18 (SUTURE) ×4 IMPLANT
SYR 20ML ECCENTRIC (SYRINGE) ×2 IMPLANT
TOWEL OR 17X24 6PK STRL BLUE (TOWEL DISPOSABLE) ×2 IMPLANT
TOWEL OR 17X26 10 PK STRL BLUE (TOWEL DISPOSABLE) ×2 IMPLANT
TRAY FOLEY CATH 14FRSI W/METER (CATHETERS) ×2 IMPLANT
WATER STERILE IRR 1000ML POUR (IV SOLUTION) ×2 IMPLANT

## 2013-05-01 NOTE — Transfer of Care (Signed)
Immediate Anesthesia Transfer of Care Note  Patient: Jay Tran  Procedure(s) Performed: Procedure(s) with comments: MAXIMUM ACCESS (MAS) POSTERIOR LUMBAR INTERBODY FUSION (PLIF) 2 LEVEL (N/A) - Lumbar Three-Four Lumbar Four-Five Maximum Access Surgery Posterior Lumbar Interbody Fusion with Nuvasive and Montoring  Patient Location: PACU  Anesthesia Type:General  Level of Consciousness: awake  Airway & Oxygen Therapy: Patient Spontanous Breathing and Patient connected to face mask oxygen  Post-op Assessment: Report given to PACU RN and Post -op Vital signs reviewed and stable  Post vital signs: Reviewed and stable  Complications: No apparent anesthesia complications

## 2013-05-01 NOTE — Anesthesia Postprocedure Evaluation (Signed)
Anesthesia Post Note  Patient: Jay Tran  Procedure(s) Performed: Procedure(s) (LRB): MAXIMUM ACCESS (MAS) POSTERIOR LUMBAR INTERBODY FUSION (PLIF) 2 LEVEL (N/A)  Anesthesia type: general  Patient location: PACU  Post pain: Pain level controlled  Post assessment: Patient's Cardiovascular Status Stable  Last Vitals:  Filed Vitals:   05/01/13 1734  BP: 133/70  Pulse: 98  Temp: 36.7 C  Resp: 20    Post vital signs: Reviewed and stable  Level of consciousness: sedated  Complications: No apparent anesthesia complications

## 2013-05-01 NOTE — Progress Notes (Signed)
Patient ID: Jay Tran, male   DOB: Feb 26, 1934, 79 y.o.   MRN: [id number] Looks good post-op. Good DF/PF. No leg pain. Back sore.

## 2013-05-01 NOTE — Anesthesia Procedure Notes (Signed)
Procedure Name: Intubation Date/Time: 05/01/2013 11:47 AM Performed by: Brien Mates DOBSON Pre-anesthesia Checklist: Patient identified, Emergency Drugs available, Suction available, Patient being monitored and Timeout performed Patient Re-evaluated:Patient Re-evaluated prior to inductionOxygen Delivery Method: Circle system utilized Preoxygenation: Pre-oxygenation with 100% oxygen Intubation Type: IV induction Ventilation: Mask ventilation without difficulty Laryngoscope Size: Mac and 2 Grade View: Grade I Tube type: Oral Tube size: 7.5 mm Number of attempts: 1 Airway Equipment and Method: Stylet Placement Confirmation: ETT inserted through vocal cords under direct vision,  positive ETCO2 and breath sounds checked- equal and bilateral Secured at: 22 cm Tube secured with: Tape Dental Injury: Teeth and Oropharynx as per pre-operative assessment

## 2013-05-01 NOTE — Plan of Care (Signed)
Problem: Consults Goal: Diagnosis - Spinal Surgery Outcome: Completed/Met Date Met:  05/01/13 Thoraco/Lumbar Spine Fusion

## 2013-05-01 NOTE — Anesthesia Preprocedure Evaluation (Signed)
Anesthesia Evaluation  Patient identified by MRN, date of birth, ID band Patient awake    Reviewed: Allergy & Precautions, H&P , NPO status   History of Anesthesia Complications (+) PONV  Airway Mallampati: I TM Distance: >3 FB Neck ROM: Full    Dental   Pulmonary          Cardiovascular hypertension, Pt. on medications + Peripheral Vascular Disease     Neuro/Psych    GI/Hepatic   Endo/Other    Renal/GU      Musculoskeletal   Abdominal   Peds  Hematology   Anesthesia Other Findings   Reproductive/Obstetrics                           Anesthesia Physical Anesthesia Plan  ASA: II  Anesthesia Plan: General   Post-op Pain Management:    Induction: Intravenous  Airway Management Planned: Oral ETT  Additional Equipment:   Intra-op Plan:   Post-operative Plan: Extubation in OR  Informed Consent: I have reviewed the patients History and Physical, chart, labs and discussed the procedure including the risks, benefits and alternatives for the proposed anesthesia with the patient or authorized representative who has indicated his/her understanding and acceptance.     Plan Discussed with: CRNA and Surgeon  Anesthesia Plan Comments:         Anesthesia Quick Evaluation

## 2013-05-01 NOTE — Preoperative (Signed)
Beta Blockers   Reason not to administer Beta Blockers:Not Applicable 

## 2013-05-01 NOTE — H&P (Signed)
Subjective: Patient is a 79 y.o. male admitted for PLIF L3-4 L4-5. Onset of symptoms was several months ago, gradually worsening since that time.  The pain is rated severe, and is located at the across the lower back and radiates to RLE. The pain is described as aching and stabbing and occurs all day. The symptoms have been progressive. Symptoms are exacerbated by exercise and standing. MRI or CT showed instability L3-4 L4-5 with stenosis/ HNP s/p R L4-5 diskectomy by another surgeon.    Past Medical History  Diagnosis Date  . PONV (postoperative nausea and vomiting)   . Hypertension   . BPH (benign prostatic hypertrophy)   . Hyperlipemia   . Arthritis   . Hearing aid worn     Hx: of right ear  . Seasonal allergies     Hx: of  . 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     Hx: of    Past Surgical History  Procedure Laterality Date  . Rotator cuff repair      right shoulder - 3 times  . Tonsillectomy    . Lumbar laminectomy  10/26/2012    Procedure: MICRODISCECTOMY LUMBAR LAMINECTOMY;  Surgeon: Eldred Manges, MD;  Location: [facility] OR;  Service: Orthopedics;  Laterality: Right;  Right L4-5 Microdiscectomy  . Hernia repair    . Colonoscopy      Hx: of    Prior to Admission medications   Medication Sig Start Date End Date Taking? Authorizing Provider  finasteride (PROSCAR) 5 MG tablet Take 5 mg by mouth every evening.    Yes Historical Provider, MD  gabapentin (NEURONTIN) 100 MG capsule Take 100 mg by mouth 2 (two) times daily.   Yes Historical Provider, MD  hydrochlorothiazide (HYDRODIURIL) 50 MG tablet Take 50 mg by mouth daily.    Yes Historical Provider, MD  lisinopril (PRINIVIL,ZESTRIL) 20 MG tablet Take 10 mg by mouth every morning.   Yes Historical Provider, MD  methocarbamol (ROBAXIN) 500 MG tablet Take 500 mg by mouth 2 (two) times daily as needed (spasms).    Yes Historical Provider, MD  niacin 500 MG tablet Take 1,000 mg by mouth 2 (two) times daily.   Yes Historical  Provider, MD  Omega-3 Fatty Acids (FISH OIL) 300 MG CAPS Take 600 mg by mouth daily.   Yes Historical Provider, MD  phenylephrine (SUDAFED PE) 10 MG TABS Take 10 mg by mouth every 4 (four) hours as needed.   Yes Historical Provider, MD  ranitidine (ZANTAC) 150 MG tablet Take 150 mg by mouth as needed for heartburn.   Yes Historical Provider, MD  Tamsulosin HCl (FLOMAX) 0.4 MG CAPS Take 0.4 mg by mouth every evening.    Yes Historical Provider, MD   No Known Allergies  History  Substance Use Topics  . Smoking status: Former Smoker    Quit date: 11/12/1963  . Smokeless tobacco: Not on file  . Alcohol Use: No    Family History  Problem Relation Age of Onset  . Stroke Mother   . Cancer - Prostate Father      Review of Systems  Positive ROS: neg  All other systems have been reviewed and were otherwise negative with the exception of those mentioned in the HPI and as above.  Objective: Vital signs in last 24 hours: Temp:  [98.1 F (36.7 C)] 98.1 F (36.7 C) (05/14 0835) Pulse Rate:  [69] 69 (05/14 0835) Resp:  [18] 18 (05/14 0835) BP: (127)/(76) 127/76 mmHg (05/14 0835) SpO2:  [  93 %] 93 % (05/14 0835)  General Appearance: Alert, cooperative, no distress, appears stated age Head: Normocephalic, without obvious abnormality, atraumatic Eyes: PERRL, conjunctiva/corneas clear, EOM's intact    Neck: Supple Back: Symmetric Lungs: respirations unlabored Heart: Regular rate and rhythm Abdomen: Soft, non-tender, bowel sounds active all four quadrants, no masses, no organomegaly Extremities: Extremities normal, atraumatic, no cyanosis or edema Pulses: 2+ and symmetric all extremities Skin: Skin color, texture, turgor normal, no rashes or lesions  NEUROLOGIC:   Mental status: Alert and oriented x4,  no aphasia, good attention span, fund of knowledge, and memory Motor Exam - grossly normal except Weakness R DF Sensory Exam - grossly normal Reflexes: 1+ Coordination - grossly  normal Gait - grossly normal Balance - grossly normal Cranial Nerves: I: smell Not tested  II: visual acuity  OS: nl    OD: nl  II: visual fields Full to confrontation  II: pupils Equal, round, reactive to light  III,VII: ptosis None  III,IV,VI: extraocular muscles  Full ROM  V: mastication Normal  V: facial light touch sensation  Normal  V,VII: corneal reflex  Present  VII: facial muscle function - upper  Normal  VII: facial muscle function - lower Normal  VIII: hearing Not tested  IX: soft palate elevation  Normal  IX,X: gag reflex Present  XI: trapezius strength  5/5  XI: sternocleidomastoid strength 5/5  XI: neck flexion strength  5/5  XII: tongue strength  Normal    Data Review Lab Results  Component Value Date   WBC 5.8 04/23/2013   HGB 15.7 04/23/2013   HCT 43.0 04/23/2013   MCV 89.4 04/23/2013   PLT 249 04/23/2013   Lab Results  Component Value Date   NA 135 04/23/2013   K 3.4* 04/23/2013   CL 97 04/23/2013   CO2 29 04/23/2013   BUN 15 04/23/2013   CREATININE 0.94 04/23/2013   GLUCOSE 96 04/23/2013   Lab Results  Component Value Date   INR 0.98 10/17/2012    Assessment/Plan: Patient admitted for PLIF L3-4 L4-5. Patient has failed conservative therapy.  I explained the condition and procedure to the patient and answered any questions.  Patient wishes to proceed with procedure as planned. Understands risks/ benefits and typical outcomes of procedure. H/o DVT in past, U/S in last 6 months neg for DVT. Will mobilize quickly if possible, lovenox if necessary.   Tramayne Sebesta S 05/01/2013 10:37 AM

## 2013-05-01 NOTE — Op Note (Signed)
05/01/2013  3:15 PM  PATIENT:  Jay Tran  79 y.o. male  PRE-OPERATIVE DIAGNOSIS:  1. Recurrent disc herniation L4-5 on the right with right L4 and L5 nerve root compression, 2. Spondylolisthesis L3-4 and L4-5 with segmental instability on dynamic imaging, 3. Moderate spinal stenosis L3-4, 4. Back and radicular pain with footdrop  POST-OPERATIVE DIAGNOSIS:  Same  PROCEDURE:   1. Decompressive lumbar laminectomy L3-4 and L4-5 to decompress the L3, L4, and L5 nerve roots requiring more work than would be required of the typical PLIF procedure in order to adequately decompress the neural elements.  2. Posterior lumbar interbody fusion L3-4 L4-5 using PEEK interbody cages packed with morcellized allograft and autograft 3. Posterior segmental fixation L3-L5 inclusive using cortical pedicle screw.    SURGEON:  Marikay Alar, MD  ASSISTANTS: Dr. Newell Coral  ANESTHESIA:  General  EBL: 300 ml  Total I/O In: 3000 [I.V.:3000] Out: 900 [Urine:600; Blood:300]  BLOOD ADMINISTERED:100 CC PRBC  DRAINS: Hemovac   INDICATION FOR PROCEDURE: This patient underwent a previous right L4-5 hemilaminectomy and discectomy by another surgeon. He had no resolution of his leg pain after the surgery. MRI showed a recurrent disc protrusion at L4-5 and pressing both L4 and L5 nerve roots. He had a partial foot drop. He also had segmental instability at L3-4 and L4-5 on flexion extension plain films. I recommended a decompression instrumented fusion.  Patient understood the risks, benefits, and alternatives and potential outcomes and wished to proceed.  PROCEDURE DETAILS:  The patient was brought to the operating room. After induction of generalized endotracheal anesthesia the patient was rolled into the prone position on chest rolls and all pressure points were padded. The patient's lumbar region was cleaned and then prepped with DuraPrep and draped in the usual sterile fashion. Anesthesia was injected and then a  dorsal midline incision was made and carried down to the lumbosacral fascia. The fascia was opened and the paraspinous musculature was taken down in a subperiosteal fashion to expose L3-4 and L4-5. Intraoperative fluoroscopy confirmed my level, and I started with placement of the L3 cortical pedicle screws. I localized the pedicle screw entry zones utilizing surface landmarks and AP and lateral fluoroscopy. I used a drill and the tap to drill and tapped in an upward and outward direction into the pedicle of L3 utilizing EMG monitoring. I tapped line to line. I then placed 50 by 35 mm pedicle screws in L3 pedicles bilaterally and checked the placement with AP and lateral fluoroscopy. I then turned my attention to the decompression and the spinous process of L3 and L4 was removed and complete lumbar laminectomies, hemi- facetectomies, and foraminotomies were performed at L34 and L4-5. The yellow ligament was removed to expose the underlying dura and nerve roots, and generous foraminotomies were performed to adequately decompress the neural elements. He had a previous foraminotomies and laminectomy at L4-5 on the right. There was significant scar tissue and at nighttime dissecting the scar tissue away from the undersurface of the bone nor to complete my foraminotomies. The L3-L4 and L5 nerve roots were decompressed distally into their respective foramina to address decompression of this nerve roots. There was significant compression of the L4 nerve root on the right.  Once the decompression was complete, I could pass a coronary dilator along each nerve root easily. I turned my attention to the posterior lower lumbar interbody fusion. The epidural venous vasculature was coagulated and cut sharply. Disc space was incised at each level and the initial  discectomy was performed with pituitary rongeurs. The disc space was distracted with sequential distractors to a height of 11 mm at L4-5 and 13 mm at L3-4. We then used a  series of scrapers and shavers to prepare the endplates for fusion. The midline was prepared with Epstein curettes. Once the complete discectomy was finished, we packed an appropriate sized peek interbody cage with local autograft and morcellized allograft, gently retracted the nerve root, and tapped the cage into position at L34 and L4-5 bilaterally. The midline was packed with morselized autograft and allograft. We then turned our attention to the posterior fixation at L4 and L5. The pedicle screw entry zones were identified utilizing surface landmarks and fluoroscopy. We probed each pedicle with the pedicle probe and tapped each pedicle with the appropriate tap. We palpated with a ball probe to assure no break in the cortex. We then placed 5 5 x 35 mm pedicle screws into the pedicles bilaterally at L4 bilaterally and 5 5 x 40 mm pedicle screws at L5 bilaterally.   We then placed lordotic rods into the multiaxial screw heads of the pedicle screws and locked these in position with the locking caps and anti-torque device. We then checked our construct with AP and lateral fluoroscopy. Irrigated with copious amounts of bacitracin-containing saline solution. Placed a medium Hemovac drain through separate stab incision. Inspected the nerve roots once again to assure adequate decompression, lined to the dura with Gelfoam, and closed the muscle and the fascia with 0 Vicryl. Closed the subcutaneous tissues with 2-0 Vicryl and subcuticular tissues with 3-0 Vicryl. The skin was closed with benzoin and Steri-Strips. Dressing was then applied, the patient was awakened from general anesthesia and transported to the recovery room in stable condition. At the end of the procedure all sponge, needle and instrument counts were correct.   PLAN OF CARE: Admit to inpatient   PATIENT DISPOSITION:  PACU - hemodynamically stable.   Delay start of Pharmacological VTE agent (>24hrs) due to surgical blood loss or risk of bleeding:   yes

## 2013-05-02 MED ORDER — PANTOPRAZOLE SODIUM 40 MG PO TBEC
40.0000 mg | DELAYED_RELEASE_TABLET | Freq: Every day | ORAL | Status: DC
  Administered 2013-05-02 – 2013-05-03 (×2): 40 mg via ORAL
  Filled 2013-05-02 (×2): qty 1

## 2013-05-02 MED FILL — Sodium Chloride IV Soln 0.9%: INTRAVENOUS | Qty: 1000 | Status: AC

## 2013-05-02 MED FILL — Sodium Chloride Irrigation Soln 0.9%: Qty: 3000 | Status: AC

## 2013-05-02 MED FILL — Heparin Sodium (Porcine) Inj 1000 Unit/ML: INTRAMUSCULAR | Qty: 30 | Status: AC

## 2013-05-02 NOTE — Evaluation (Signed)
Physical Therapy Evaluation Patient Details Name: Jay Tran MRN: [id number] DOB: 08/17/1934 Today's Date: 05/02/2013 Time: 6962-9528 PT Time Calculation (min): 27 min  PT Assessment / Plan / Recommendation Clinical Impression  79 yo male admitted with back and leg pain.  He underwent a  PLIF with decompression.  Pt is doing well with post op ambulation with lumbar corset and RW.  He will benefit from RW and 3 in 1 at home, but does not need follow up PT    PT Assessment  Patent does not need any further PT services    Follow Up Recommendations  No PT follow up    Does the patient have the potential to tolerate intense rehabilitation      Barriers to Discharge        Equipment Recommendations  Rolling walker with 5" wheels;Other (comment) (3 in 1 (or other recommended by OT))    Recommendations for Other Services     Frequency      Precautions / Restrictions Precautions Precautions: Back Precaution Booklet Issued: No Required Braces or Orthoses: Spinal Brace Spinal Brace: Lumbar corset;Applied in sitting position Restrictions Weight Bearing Restrictions: No   Pertinent Vitals/Pain Pt c/o post op back pain       Mobility  Bed Mobility Bed Mobility: Rolling Right;Rolling Left Rolling Right: 4: Min guard Rolling Left: 4: Min guard Transfers Transfers: Sit to Stand;Stand to Sit Sit to Stand: 4: Min guard Stand to Sit: 4: Min guard Details for Transfer Assistance: pt able to apply and remove back brace in sitting Ambulation/Gait Ambulation/Gait Assistance: 5: Supervision Ambulation Distance (Feet): 200 Feet Assistive device: Rolling walker Ambulation/Gait Assistance Details: pt encouraged to step up into RW with trunk extended.  Tolerated brace well Gait Pattern: Step-through pattern Gait velocity: decrerased General Gait Details: pt benefits from RW for support Stairs: Yes Stairs Assistance: 4: Min assist Stairs Assistance Details (indicate cue type and  reason): pt benefits from handrails.  Wife will arrange to have another person to assist with entering into home Stair Management Technique: Two rails;No rails Number of Stairs: 2    Exercises Other Exercises Other Exercises: gentle core activation   PT Diagnosis:    PT Problem List:   PT Treatment Interventions:     PT Goals    Visit Information  Last PT Received On: 05/02/13 Assistance Needed: +1    Subjective Data  Subjective: "I hope I don't have to go back to PT' Patient Stated Goal: to go home today   Prior Functioning  Home Living Lives With: Spouse Available Help at Discharge: Family Type of Home: House Home Access: Stairs to enter Secretary/administrator of Steps: 2 Entrance Stairs-Rails: None Home Layout: One level Home Adaptive Equipment: None Prior Function Level of Independence: Independent Comments: endurance limited by leg pain and need to maintain forward flexed posture Communication Communication: No difficulties    Cognition  Cognition Arousal/Alertness: Awake/alert Behavior During Therapy: WFL for tasks assessed/performed Overall Cognitive Status: Within Functional Limits for tasks assessed (pt has diffuculty with multi step commands)    Extremity/Trunk Assessment Right Lower Extremity Assessment RLE ROM/Strength/Tone: WFL for tasks assessed RLE Sensation: WFL - Light Touch;WFL - Proprioception Left Lower Extremity Assessment LLE ROM/Strength/Tone: WFL for tasks assessed LLE Sensation: WFL - Light Touch;WFL - Proprioception Trunk Assessment Trunk Assessment: Other exceptions Trunk Exceptions: anterior abdomen, post op back incision with hemovac   Balance Balance Balance Assessed: Yes Static Sitting Balance Static Sitting - Balance Support: No upper extremity supported Static Sitting -  Level of Assistance: 7: Independent Static Standing Balance Static Standing - Balance Support: Bilateral upper extremity supported Static Standing - Level of  Assistance: 6: Modified independent (Device/Increase time)  End of Session PT - End of Session Equipment Utilized During Treatment: Back brace Activity Tolerance: Patient tolerated treatment well Patient left: in bed;with family/visitor present Nurse Communication: Mobility status  GP    Bayard Hugger. Manson Passey, [place] [phone] 05/02/2013, 10:36 AM

## 2013-05-02 NOTE — Progress Notes (Signed)
UR COMPLETED  

## 2013-05-02 NOTE — Evaluation (Signed)
Occupational Therapy Evaluation Patient Details Name: PIERSON VANTOL MRN: [id number] DOB: 07/14/1934 Today's Date: 05/02/2013 Time: 7829-5621 OT Time Calculation (min): 21 min  OT Assessment / Plan / Recommendation Clinical Impression  Pt s/p PLIF L3-4, L4-L5.  Education completed with both pt and wife.  Pt overall min guard level with min assist for LB ADLs. Wife demo'ing independence in assisting pt. No further acute OT needs. Signing off.    OT Assessment  Patient does not need any further OT services    Follow Up Recommendations  No OT follow up;Supervision/Assistance - 24 hour    Barriers to Discharge      Equipment Recommendations  3 in 1 bedside comode    Recommendations for Other Services    Frequency       Precautions / Restrictions Precautions Precautions: Back Precaution Booklet Issued: No Precaution Comments: Educated pt and wife on 3/3 back precautions. Required Braces or Orthoses: Spinal Brace Spinal Brace: Lumbar corset;Applied in sitting position Restrictions Weight Bearing Restrictions: No   Pertinent Vitals/Pain See vitals    ADL  Grooming: Performed;Supervision/safety;Wash/dry hands Where Assessed - Grooming: Unsupported standing Upper Body Bathing: Simulated;Set up Where Assessed - Upper Body Bathing: Unsupported sitting Lower Body Bathing: Simulated;Min guard Where Assessed - Lower Body Bathing: Unsupported sit to stand Upper Body Dressing: Performed;Set up Where Assessed - Upper Body Dressing: Unsupported sitting Lower Body Dressing: Simulated;Minimal assistance Where Assessed - Lower Body Dressing: Unsupported sit to stand Toilet Transfer: Performed;Min guard Toilet Transfer Method: Sit to Barista: Comfort height toilet Toileting - Clothing Manipulation and Hygiene: Performed;Min guard Where Assessed - Engineer, mining and Hygiene: Sit to stand from 3-in-1 or toilet Equipment Used: Gait belt;Back  brace;Rolling walker Transfers/Ambulation Related to ADLs: min guard with RW ADL Comments: Educated and demo'd tub transfer technique for pt and wife (side stepping over tub ledge with hands stabilized on Frasco).  Pt declining practice of tub transfer but verbalized understanding. Educated pt and wife on ADL techniques. Pt able to cross ankles over knees prior to sx but with some dificulty today during LB dressing simulation. Wife also reports she will assist pt as needed.  Pt sometimes requiring VC to adhere to back precautions.  Wife independent in cueing pt keep back precautions.    OT Diagnosis:    OT Problem List:   OT Treatment Interventions:     OT Goals    Visit Information  Last OT Received On: 05/02/13 Assistance Needed: +1    Subjective Data      Prior Functioning     Home Living Lives With: Spouse Available Help at Discharge: Family;Available 24 hours/day Type of Home: House Home Access: Stairs to enter [employer] of Steps: 2 Entrance Stairs-Rails: None Home Layout: One level Bathroom Shower/Tub: Engineer, manufacturing systems: Standard Home Adaptive Equipment: None Prior Function Level of Independence: Independent Comments: endurance limited by leg pain and need to maintain forward flexed posture Communication Communication: HOH Dominant Hand: Right         Vision/Perception     Cognition  Cognition Arousal/Alertness: Awake/alert Behavior During Therapy: WFL for tasks assessed/performed Overall Cognitive Status: Within Functional Limits for tasks assessed    Extremity/Trunk Assessment Right Upper Extremity Assessment RUE ROM/Strength/Tone: [facility] for tasks assessed Left Upper Extremity Assessment LUE ROM/Strength/Tone: WFL for tasks assessed Right Lower Extremity Assessment RLE ROM/Strength/Tone: WFL for tasks assessed RLE Sensation: WFL - Light Touch;WFL - Proprioception Left Lower Extremity Assessment LLE ROM/Strength/Tone: WFL for  tasks assessed LLE  Sensation: WFL - Light Touch;WFL - Proprioception Trunk Assessment Trunk Assessment: Other exceptions Trunk Exceptions: anterior abdomen, post op back incision with hemovac     Mobility Bed Mobility Bed Mobility: Rolling Right;Right Sidelying to Sit;Sitting - Scoot to [employer] of Bed;Sit to Sidelying Right Rolling Right: 4: Min guard Rolling Left: 4: Min guard Right Sidelying to Sit: 4: Min guard Sitting - Scoot to [employer] of Bed: 4: Min guard Sit to Sidelying Right: 4: Min guard Details for Bed Mobility Assistance: Pt attempting to perform supine<>sit. VCs for log roll technique in order to maintain back precautions. Transfers Transfers: Sit to Stand;Stand to Sit Sit to Stand: 4: Min guard;From toilet;From bed;With upper extremity assist Stand to Sit: 4: Min guard;To toilet;To bed;With upper extremity assist Details for Transfer Assistance: VCs for safe hand placement.     Exercise   Balance   End of Session OT - End of Session Equipment Utilized During Treatment: Back brace;Gait belt Activity Tolerance: Patient tolerated treatment well Patient left: in bed;with call bell/phone within reach;with family/visitor present Nurse Communication: Mobility status (pt voided 200 cc urine in urinal)  GO   05/02/2013 Cipriano Mile OTR/L Pager [phone] Office [phone]   Cipriano Mile 05/02/2013, 12:05 PM

## 2013-05-02 NOTE — Progress Notes (Signed)
Patient ID: Jay Tran, male   DOB: Jun 19, 1934, 79 y.o.   MRN: [id number] Subjective: Patient reports he;s doing great. LBP 2/10, no leg pain or N/T/W  Objective: Vital signs in last 24 hours: Temp:  [97.5 F (36.4 C)-98.8 F (37.1 C)] 98.8 F (37.1 C) (05/15 0822) Pulse Rate:  [65-142] 65 (05/15 0822) Resp:  [16-20] 18 (05/15 0822) BP: (102-154)/(57-87) 110/59 mmHg (05/15 0822) SpO2:  [91 %-98 %] 95 % (05/15 0822)  Intake/Output from previous day: 05/14 0701 - 05/15 0700 In: 3480 [P.O.:480; I.V.:3000] Out: 2850 [Urine:2225; Drains:325; Blood:300] Intake/Output this shift: Total I/O In: 480 [P.O.:480] Out: 200 [Urine:200]  Neurologic: Grossly normal  Lab Results: Lab Results  Component Value Date   WBC 5.8 04/23/2013   HGB 15.7 04/23/2013   HCT 43.0 04/23/2013   MCV 89.4 04/23/2013   PLT 249 04/23/2013   Lab Results  Component Value Date   INR 0.98 10/17/2012   BMET Lab Results  Component Value Date   NA 135 04/23/2013   K 3.4* 04/23/2013   CL 97 04/23/2013   CO2 29 04/23/2013   GLUCOSE 96 04/23/2013   BUN 15 04/23/2013   CREATININE 0.94 04/23/2013   CALCIUM 10.1 04/23/2013    Studies/Results: Dg Lumbar Spine 2-3 Views  05/01/2013   *RADIOLOGY REPORT*  Clinical Data: L3-5 laminectomy and fusion.  DG C-ARM 61-120 MIN, LUMBAR SPINE - 2-3 VIEW  Technique: Two fluoroscopic intraoperative spot views of the lower lumbar spine provided.  Comparison:  MRI lumbar spine 04/08/2013.  Findings: Provided images demonstrate pedicle screws and stabilization bars in place from L3-L5 with interbody spacers identified.  IMPRESSION: L3-5 fusion.   Original Report Authenticated By: Holley Dexter, M.D.   Dg C-arm [phone] Min  05/01/2013   *RADIOLOGY REPORT*  Clinical Data: L3-5 laminectomy and fusion.  DG C-ARM 61-120 MIN, LUMBAR SPINE - 2-3 VIEW  Technique: Two fluoroscopic intraoperative spot views of the lower lumbar spine provided.  Comparison:  MRI lumbar spine 04/08/2013.  Findings: Provided  images demonstrate pedicle screws and stabilization bars in place from L3-L5 with interbody spacers identified.  IMPRESSION: L3-5 fusion.   Original Report Authenticated By: Holley Dexter, M.D.    Assessment/Plan: Doing great except urinary retention. Replace foley and void trial again tomorrow.   LOS: 1 day    Babe Clenney S 05/02/2013, 12:11 PM

## 2013-05-03 MED ORDER — OXYCODONE-ACETAMINOPHEN 5-325 MG PO TABS: 1.0000 | ORAL_TABLET | ORAL | Status: DC | PRN

## 2013-05-03 MED ORDER — METHOCARBAMOL 500 MG PO TABS: 500.0000 mg | ORAL_TABLET | Freq: Four times a day (QID) | ORAL | Status: DC | PRN

## 2013-05-03 NOTE — Addendum Note (Signed)
Addendum created 05/03/13 0912 by Fabienne Bruns, RN   Modules edited: Anesthesia Device Management

## 2013-05-03 NOTE — Progress Notes (Signed)
Pt and wife given D/C instructions with Rx's, verbal understanding given. Pt and wife taught care of Foley at home, return demo done. Pt received RW and 3n1 prior to D/C for home use. Pt D/C'd home with Foley @ 1435 per MD order. Rema Fendt, RN

## 2013-05-03 NOTE — Discharge Summary (Signed)
Physician Discharge Summary  Patient ID: Jay Tran MRN: [id number] DOB/AGE: 02/12/1934 79 y.o.  Admit date: 05/01/2013 Discharge date: 05/03/2013  Admission Diagnoses: HNP/ instability   Discharge Diagnoses: same   Discharged Condition: good  Hospital Course: The patient was admitted on 05/01/2013 and taken to the operating room where the patient underwent PLIF L3-4, L4-5. The patient tolerated the procedure well and was taken to the recovery room and then to the floor in stable condition. The hospital course was routine. There were no complications. The wound remained clean dry and intact. Pt had appropriate back soreness. No complaints of leg pain or new N/T/W. The patient remained afebrile with stable vital signs, and tolerated a regular diet. The patient continued to increase activities, and pain was well controlled with oral pain medications. Had urinary retention and was sent home with a leg bag to f/u with his urologist.  Consults: None  Significant Diagnostic Studies:  Results for orders placed during the hospital encounter of 04/23/13  SURGICAL PCR SCREEN      Result Value Range   MRSA, PCR NEGATIVE  NEGATIVE   Staphylococcus aureus NEGATIVE  NEGATIVE  BASIC METABOLIC PANEL      Result Value Range   Sodium 135  135 - 145 mEq/L   Potassium 3.4 (*) 3.5 - 5.1 mEq/L   Chloride 97  96 - 112 mEq/L   CO2 29  19 - 32 mEq/L   Glucose, Bld 96  70 - 99 mg/dL   BUN 15  6 - 23 mg/dL   Creatinine, Ser 4.09  0.50 - 1.35 mg/dL   Calcium 81.1  8.4 - 91.4 mg/dL   GFR calc non Af Amer 79 (*) >90 mL/min   GFR calc Af Amer >90  >90 mL/min  CBC      Result Value Range   WBC 5.8  4.0 - 10.5 K/uL   RBC 4.81  4.22 - 5.81 MIL/uL   Hemoglobin 15.7  13.0 - 17.0 g/dL   HCT 78.2  95.6 - 21.3 %   MCV 89.4  78.0 - 100.0 fL   MCH 32.6  26.0 - 34.0 pg   MCHC 36.5 (*) 30.0 - 36.0 g/dL   RDW 08.6  57.8 - 46.9 %   Platelets 249  150 - 400 K/uL  TYPE AND SCREEN      Result Value Range   ABO/RH(D) A POS     Antibody Screen NEG     Sample Expiration 05/07/2013    ABO/RH      Result Value Range   ABO/RH(D) A POS      Dg Lumbar Spine 2-3 Views  05/01/2013   *RADIOLOGY REPORT*  Clinical Data: L3-5 laminectomy and fusion.  DG C-ARM 61-120 MIN, LUMBAR SPINE - 2-3 VIEW  Technique: Two fluoroscopic intraoperative spot views of the lower lumbar spine provided.  Comparison:  MRI lumbar spine 04/08/2013.  Findings: Provided images demonstrate pedicle screws and stabilization bars in place from L3-L5 with interbody spacers identified.  IMPRESSION: L3-5 fusion.   Original Report Authenticated By: Holley Dexter, M.D.   Mr Lumbar Spine W Wo Contrast  04/08/2013   *RADIOLOGY REPORT*  Clinical Data: Recurrent low back and right leg pain.  Previous surgery   November 2013.  MRI LUMBAR SPINE WITHOUT AND WITH CONTRAST  Technique:  Multiplanar and multiecho pulse sequences of the lumbar spine were obtained without and with intravenous contrast.  Contrast: 18mL MULTIHANCE GADOBENATE DIMEGLUMINE 529 MG/ML IV SOLN  BUN and creatinine were  obtained on site at [facility] at [address] Results:  BUN 14.0 mg/dL,  Creatinine 0.9 mg/dL.  Comparison: MRI lumbar spine 01/21/2012.  Findings: Minor anterolisthesis L3-4 and L4-5 related to facet disease.  Marrow signal homogeneous except for endplate reactive changes L4-L5.  Recent right L4-5 hemilaminectomy 10/26/2012. Normal conus.  Moderate bladder distention, likely bladder outlet obstruction.  No abnormal post contrast enhancement other than that related to expected postsurgical change at L4-5 on the right along with discogenic type reactive enhancement.  The individual disc spaces are examined as follows:  L1-2:  Mild annular bulging.  No stenosis or disc protrusion.  L2-3:  Central and leftward protrusion.  Mild facet arthropathy. Mild to moderate central canal stenosis and foraminal narrowing. Left L2 and at L3 nerve root encroachment.  L3-4:   Mild annular bulging.  1 mm slip. Moderate facet and ligamentum flavum hypertrophy. Moderate stenosis.   Left greater than right L4 nerve root encroachment.  L4-5:  There is a recurrent central and rightward disc extrusion. Facet arthropathy is present. 1 mm slip.  This extrusion extends into the foramen and extraforaminal soft tissues on the right. Significant right L4 and L5 nerve root encroachment are present. There is also mild central canal stenosis, similar to priors.  L5-S1:  Mild bulge.  Mild facet arthropathy.  No neural impingement.  IMPRESSION: Recurrent central and rightward disc extrusion L4-L5, with significant right L4 and right L5 nerve root encroachment.  No signs of postoperative infection.   Original Report Authenticated By: Davonna Belling, M.D.   Dg C-arm 61-120 Min  05/01/2013   *RADIOLOGY REPORT*  Clinical Data: L3-5 laminectomy and fusion.  DG C-ARM 61-120 MIN, LUMBAR SPINE - 2-3 VIEW  Technique: Two fluoroscopic intraoperative spot views of the lower lumbar spine provided.  Comparison:  MRI lumbar spine 04/08/2013.  Findings: Provided images demonstrate pedicle screws and stabilization bars in place from L3-L5 with interbody spacers identified.  IMPRESSION: L3-5 fusion.   Original Report Authenticated By: Holley Dexter, M.D.    Antibiotics:  Anti-infectives   Start     Dose/Rate Route Frequency Ordered Stop   05/01/13 1800  ceFAZolin (ANCEF) IVPB 1 g/50 mL premix     1 g 100 mL/hr over 30 Minutes Intravenous Every 8 hours 05/01/13 1739 05/02/13 0351   05/01/13 1437  ceFAZolin (ANCEF) 1-5 GM-% IVPB    Comments:  STEELMAN, CRAIG: cabinet override      05/01/13 1437 05/02/13 0244   05/01/13 1205  bacitracin 50,000 Units in sodium chloride irrigation 0.9 % 500 mL irrigation  Status:  Discontinued       As needed 05/01/13 1205 05/01/13 1528   05/01/13 1004  bacitracin [address] UNITS injection    Comments:  Reece Packer: cabinet override      05/01/13 1004 05/01/13 2214    05/01/13 0600  ceFAZolin (ANCEF) IVPB 2 g/50 mL premix     2 g 100 mL/hr over 30 Minutes Intravenous On call to O.R. 04/30/13 1453 05/01/13 1436      Discharge Exam: Blood pressure 134/66, pulse 62, temperature 98.5 F (36.9 C), temperature source Oral, resp. rate 16, SpO2 95.00%. Neurologic: Grossly normal Incision CDI  Discharge Medications:     Medication List    TAKE these medications       finasteride 5 MG tablet  Commonly known as:  PROSCAR  Take 5 mg by mouth every evening.     Fish Oil 300 MG Caps  Take 600 mg by mouth  daily.     gabapentin 100 MG capsule  Commonly known as:  NEURONTIN  Take 100 mg by mouth 2 (two) times daily.     hydrochlorothiazide 50 MG tablet  Commonly known as:  HYDRODIURIL  Take 50 mg by mouth daily.     lisinopril 20 MG tablet  Commonly known as:  PRINIVIL,ZESTRIL  Take 10 mg by mouth every morning.     methocarbamol 500 MG tablet  Commonly known as:  ROBAXIN  Take 500 mg by mouth 2 (two) times daily as needed (spasms).     niacin 500 MG tablet  Take 1,000 mg by mouth 2 (two) times daily.     phenylephrine 10 MG Tabs  Commonly known as:  SUDAFED PE  Take 10 mg by mouth every 4 (four) hours as needed.     ranitidine 150 MG tablet  Commonly known as:  ZANTAC  Take 150 mg by mouth as needed for heartburn.     tamsulosin 0.4 MG Caps  Commonly known as:  FLOMAX  Take 0.4 mg by mouth every evening.        Disposition: home   Final Dx: PLIF      Discharge Orders   Future Orders Complete By Expires     Call MD for:  difficulty breathing, headache or visual disturbances  As directed     Call MD for:  persistant nausea and vomiting  As directed     Call MD for:  redness, tenderness, or signs of infection (pain, swelling, redness, odor or green/yellow discharge around incision site)  As directed     Call MD for:  severe uncontrolled pain  As directed     Call MD for:  temperature >100.4  As directed     Diet - low sodium  heart healthy  As directed     Driving Restrictions  As directed     Comments:      2 weeks    Increase activity slowly  As directed     Lifting restrictions  As directed     Comments:      Less than 10 lbs       Follow-up Information   Follow up with Rafeef Lau S, MD. Schedule an appointment as soon as possible for a visit in 2 weeks.   Contact information:   [address] [place] [address] [phone]        Signed: Tia Alert 05/03/2013, 12:45 PM

## 2013-05-03 NOTE — Progress Notes (Signed)
Patient ID: BON DOWIS, male   DOB: 1934-01-13, 79 y.o.   MRN: [id number] Looks great. Foley out. amb well. Good strength. Incision CDI. Await void trial.

## 2013-07-24 ENCOUNTER — Other Ambulatory Visit: Payer: Self-pay

## 2013-10-24 ENCOUNTER — Other Ambulatory Visit: Payer: Self-pay

## 2015-03-23 ENCOUNTER — Other Ambulatory Visit: Payer: Self-pay | Admitting: Neurological Surgery

## 2015-03-23 DIAGNOSIS — M545 Low back pain: Secondary | ICD-10-CM

## 2015-03-24 ENCOUNTER — Ambulatory Visit
Admission: RE | Admit: 2015-03-24 | Discharge: 2015-03-24 | Disposition: A | Discharge status: Home / Self Care | Payer: Medicare Other | Source: Ambulatory Visit | Attending: Neurological Surgery | Admitting: Neurological Surgery

## 2015-03-24 DIAGNOSIS — M545 Low back pain: Secondary | ICD-10-CM

## 2015-03-31 ENCOUNTER — Other Ambulatory Visit: Payer: Self-pay | Admitting: Neurological Surgery

## 2015-04-02 ENCOUNTER — Other Ambulatory Visit: Payer: Self-pay | Admitting: Neurological Surgery

## 2015-04-02 DIAGNOSIS — M48061 Spinal stenosis, lumbar region without neurogenic claudication: Secondary | ICD-10-CM

## 2015-04-06 ENCOUNTER — Ambulatory Visit
Admission: RE | Admit: 2015-04-06 | Discharge: 2015-04-06 | Disposition: A | Discharge status: Home / Self Care | Payer: Medicare Other | Source: Ambulatory Visit | Attending: Neurological Surgery | Admitting: Neurological Surgery

## 2015-04-06 DIAGNOSIS — M48061 Spinal stenosis, lumbar region without neurogenic claudication: Secondary | ICD-10-CM

## 2015-04-06 MED ORDER — IOHEXOL 180 MG/ML  SOLN
1.0000 mL | Freq: Once | INTRAMUSCULAR | Status: AC | PRN
  Administered 2015-04-06: 1 mL via EPIDURAL

## 2015-04-06 MED ORDER — METHYLPREDNISOLONE ACETATE 40 MG/ML INJ SUSP (RADIOLOG
120.0000 mg | Freq: Once | INTRAMUSCULAR | Status: AC
  Administered 2015-04-06: 120 mg via EPIDURAL

## 2015-04-06 NOTE — Discharge Instructions (Signed)

## 2015-04-08 ENCOUNTER — Encounter (HOSPITAL_COMMUNITY)
Admission: RE | Admit: 2015-04-08 | Discharge: 2015-04-08 | Disposition: A | Discharge status: Home / Self Care | Payer: Medicare Other | Source: Ambulatory Visit | Attending: Neurological Surgery | Admitting: Neurological Surgery

## 2015-04-08 ENCOUNTER — Encounter (HOSPITAL_COMMUNITY): Payer: Self-pay

## 2015-04-08 ENCOUNTER — Ambulatory Visit (HOSPITAL_COMMUNITY)
Admission: RE | Admit: 2015-04-08 | Discharge: 2015-04-08 | Disposition: A | Discharge status: Home / Self Care | Payer: Medicare Other | Source: Ambulatory Visit | Attending: Neurological Surgery | Admitting: Neurological Surgery

## 2015-04-08 DIAGNOSIS — M48061 Spinal stenosis, lumbar region without neurogenic claudication: Secondary | ICD-10-CM

## 2015-04-08 HISTORY — DX: Gastro-esophageal reflux disease without esophagitis: K21.9

## 2015-04-08 LAB — CBC WITH DIFFERENTIAL/PLATELET
Basophils Absolute: 0 10*3/uL (ref 0.0–0.1)
Basophils Relative: 0 % (ref 0–1)
Eosinophils Absolute: 0.1 10*3/uL (ref 0.0–0.7)
Eosinophils Relative: 0 % (ref 0–5)
HCT: 42 % (ref 39.0–52.0)
Hemoglobin: 14.8 g/dL (ref 13.0–17.0)
Lymphocytes Relative: 14 % (ref 12–46)
Lymphs Abs: 1.7 10*3/uL (ref 0.7–4.0)
MCH: 32.8 pg (ref 26.0–34.0)
MCHC: 35.2 g/dL (ref 30.0–36.0)
MCV: 93.1 fL (ref 78.0–100.0)
Monocytes Absolute: 1 10*3/uL (ref 0.1–1.0)
Monocytes Relative: 8 % (ref 3–12)
Neutro Abs: 9.6 10*3/uL — ABNORMAL HIGH (ref 1.7–7.7)
Neutrophils Relative %: 78 % — ABNORMAL HIGH (ref 43–77)
Platelets: 307 10*3/uL (ref 150–400)
RBC: 4.51 MIL/uL (ref 4.22–5.81)
RDW: 13 % (ref 11.5–15.5)
WBC: 12.4 10*3/uL — ABNORMAL HIGH (ref 4.0–10.5)

## 2015-04-08 LAB — BASIC METABOLIC PANEL
Anion gap: 11 (ref 5–15)
BUN: 12 mg/dL (ref 6–23)
CO2: 28 mmol/L (ref 19–32)
Calcium: 9.4 mg/dL (ref 8.4–10.5)
Chloride: 99 mmol/L (ref 96–112)
Creatinine, Ser: 0.98 mg/dL (ref 0.50–1.35)
GFR calc Af Amer: 89 mL/min — ABNORMAL LOW (ref >90)
GFR calc non Af Amer: 77 mL/min — ABNORMAL LOW (ref >90)
Glucose, Bld: 108 mg/dL — ABNORMAL HIGH (ref 70–99)
Potassium: 3.7 mmol/L (ref 3.5–5.1)
Sodium: 138 mmol/L (ref 135–145)

## 2015-04-08 LAB — TYPE AND SCREEN
ABO/RH(D): A POS
Antibody Screen: NEGATIVE

## 2015-04-08 LAB — PROTIME-INR
INR: 1.01 (ref 0.00–1.49)
Prothrombin Time: 13.4 seconds (ref 11.6–15.2)

## 2015-04-08 LAB — SURGICAL PCR SCREEN
MRSA, PCR: NEGATIVE
Staphylococcus aureus: NEGATIVE

## 2015-04-08 NOTE — Progress Notes (Signed)
   04/08/15 1337  OBSTRUCTIVE SLEEP APNEA  Have you ever been diagnosed with sleep apnea through a sleep study? No  Do you snore loudly (loud enough to be heard through closed doors)?  0  Do you often feel tired, fatigued, or sleepy during the daytime? 0  Has anyone observed you stop breathing during your sleep? 0  Do you have, or are you being treated for high blood pressure? 1  BMI more than 35 kg/m2? 0  Age over 79 years old? 1  Neck circumference greater than 40 cm/16 inches? 1  Gender: 1

## 2015-04-08 NOTE — Pre-Procedure Instructions (Signed)
[facility]  04/08/2015   Your procedure is scheduled on: Wednesday, April 15, 2015  Report to [facility] Admitting at 11:15 AM.  Call this number if you have problems the morning of surgery: [phone]   Remember:   Do not eat food or drink liquids after midnight Tuesday, April 14, 2015   Take these medicines the morning of surgery with A SIP OF WATER: if needed:ranitidine (ZANTAC) for heartburn  Stop taking Aspirin, vitamins, phenylephrine (SUDAFED PE) and herbal medications such as Omega-3 Fatty Acids (FISH OIL).  Do not take any NSAIDs ie: Ibuprofen, Advil, Naproxen or any medication containing Aspirin; stop now.   Do not wear jewelry, make-up or nail polish.  Do not wear lotions, powders, or perfumes. You may not wear deodorant.  Do not shave 48 hours prior to surgery. Men may shave face and neck.  Do not bring valuables to the hospital.  [facility] is not responsible for any belongings or valuables.               Contacts, dentures or bridgework may not be worn into surgery.  Leave suitcase in the car. After surgery it may be brought to your room.  For patients admitted to the hospital, discharge time is determined by your treatment team.               Patients discharged the day of surgery will not be allowed to drive home.  Name and phone number of your driver:   Special Instructions:  Special Instructions:Special Instructions: [facility] - Preparing for Surgery  Before surgery, you can play an important role.  Because skin is not sterile, your skin needs to be as free of germs as possible.  You can reduce the number of germs on you skin by washing with CHG (chlorahexidine gluconate) soap before surgery.  CHG is an antiseptic cleaner which kills germs and bonds with the skin to continue killing germs even after washing.  Please DO NOT use if you have an allergy to CHG or antibacterial soaps.  If your skin becomes reddened/irritated stop using the CHG and inform  your nurse when you arrive at Short Stay.  Do not shave (including legs and underarms) for at least 48 hours prior to the first CHG shower.  You may shave your face.  Please follow these instructions carefully:   1.  Shower with CHG Soap the night before surgery and the morning of Surgery.  2.  If you choose to wash your hair, wash your hair first as usual with your normal shampoo.  3.  After you shampoo, rinse your hair and body thoroughly to remove the Shampoo.  4.  Use CHG as you would any other liquid soap.  You can apply chg directly  to the skin and wash gently with scrungie or a clean washcloth.  5.  Apply the CHG Soap to your body ONLY FROM THE NECK DOWN.  Do not use on open wounds or open sores.  Avoid contact with your eyes, ears, mouth and genitals (private parts).  Wash genitals (private parts) with your normal soap.  6.  Wash thoroughly, paying special attention to the area where your surgery will be performed.  7.  Thoroughly rinse your body with warm water from the neck down.  8.  DO NOT shower/wash with your normal soap after using and rinsing off the CHG Soap.  9.  Pat yourself dry with a clean towel.  10.  Wear clean pajamas.            11.  Place clean sheets on your bed the night of your first shower and do not sleep with pets.  Day of Surgery  Do not apply any lotions/deodorants the morning of surgery.  Please wear clean clothes to the hospital/surgery center.   Please read over the following fact sheets that you were given: Pain Booklet, Coughing and Deep Breathing, Blood Transfusion Information, MRSA Information and Surgical Site Infection Prevention

## 2015-04-08 NOTE — Progress Notes (Signed)
Pt denies SOB, chest pain, and being under the care of a cardiologist. Pt denies having a chest x ray and EKG within the last year. Pt denies having a stress test, echo and cardiac cath. Pt chart forwarded to [place], [place] ( anesthesia) to review EKG.

## 2015-04-09 NOTE — Progress Notes (Signed)
Anesthesia Chart Review:  Patient is a 81 year old male scheduled for MAS, PLIF L2-3 extension of hardware on 04/15/15.  History includes right L4-5 microdiscectomy '13, L3-5 PLIF '14, post-operative N/V, RLE DVT '13, arthritis, hyperlipidemia, BPH, hypertension, tonsillectomy, right rotator cuff repair '04, former smoker, hearing aid. PCP is Dr. Arelia Sneddon. OSA screening score is 4.  04/08/15 EKG: NSR. RSR prime in V1. EKG stable since 10/17/12.   04/08/15 CXR: No active cardiopulmonary disease.  Pre-operative labs noted.  Anticipate that he can proceed as planned.  [facility] Short Stay Center/Anesthesiology Phone [phone] 04/09/2015 3:28 PM

## 2015-04-14 MED ORDER — CEFAZOLIN SODIUM-DEXTROSE 2-3 GM-% IV SOLR
2.0000 g | INTRAVENOUS | Status: AC
  Administered 2015-04-15: 2 g via INTRAVENOUS
  Filled 2015-04-14: qty 50

## 2015-04-14 MED ORDER — DEXAMETHASONE SODIUM PHOSPHATE 10 MG/ML IJ SOLN
10.0000 mg | INTRAMUSCULAR | Status: AC
  Administered 2015-04-15: 10 mg via INTRAVENOUS
  Filled 2015-04-14: qty 1

## 2015-04-15 ENCOUNTER — Inpatient Hospital Stay (HOSPITAL_COMMUNITY): Payer: Medicare Other | Admitting: Certified Registered"

## 2015-04-15 ENCOUNTER — Inpatient Hospital Stay (HOSPITAL_COMMUNITY): Payer: Medicare Other | Admitting: Vascular Surgery

## 2015-04-15 ENCOUNTER — Inpatient Hospital Stay (HOSPITAL_COMMUNITY)
Admission: RE | Admit: 2015-04-15 | Discharge: 2015-04-16 | DRG: 460 | Disposition: A | Discharge status: Home / Self Care | Payer: Medicare Other | Source: Ambulatory Visit | Attending: Neurological Surgery | Admitting: Neurological Surgery

## 2015-04-15 ENCOUNTER — Encounter (HOSPITAL_COMMUNITY)
Admission: RE | Disposition: A | Discharge status: Home / Self Care | Payer: Self-pay | Source: Ambulatory Visit | Attending: Neurological Surgery

## 2015-04-15 ENCOUNTER — Encounter (HOSPITAL_COMMUNITY): Payer: Self-pay | Admitting: *Deleted

## 2015-04-15 ENCOUNTER — Inpatient Hospital Stay (HOSPITAL_COMMUNITY): Payer: Medicare Other

## 2015-04-15 DIAGNOSIS — Z87891 Personal history of nicotine dependence: Secondary | ICD-10-CM | POA: Diagnosis not present

## 2015-04-15 DIAGNOSIS — N400 Enlarged prostate without lower urinary tract symptoms: Secondary | ICD-10-CM | POA: Diagnosis present

## 2015-04-15 DIAGNOSIS — M5442 Lumbago with sciatica, left side: Secondary | ICD-10-CM | POA: Diagnosis present

## 2015-04-15 DIAGNOSIS — E785 Hyperlipidemia, unspecified: Secondary | ICD-10-CM | POA: Diagnosis present

## 2015-04-15 DIAGNOSIS — Z86718 Personal history of other venous thrombosis and embolism: Secondary | ICD-10-CM

## 2015-04-15 DIAGNOSIS — K219 Gastro-esophageal reflux disease without esophagitis: Secondary | ICD-10-CM | POA: Diagnosis present

## 2015-04-15 DIAGNOSIS — Z981 Arthrodesis status: Secondary | ICD-10-CM | POA: Diagnosis not present

## 2015-04-15 DIAGNOSIS — H9191 Unspecified hearing loss, right ear: Secondary | ICD-10-CM | POA: Diagnosis present

## 2015-04-15 DIAGNOSIS — M4806 Spinal stenosis, lumbar region: Principal | ICD-10-CM | POA: Diagnosis present

## 2015-04-15 DIAGNOSIS — M5441 Lumbago with sciatica, right side: Secondary | ICD-10-CM | POA: Diagnosis present

## 2015-04-15 DIAGNOSIS — Z01818 Encounter for other preprocedural examination: Secondary | ICD-10-CM

## 2015-04-15 DIAGNOSIS — Z419 Encounter for procedure for purposes other than remedying health state, unspecified: Secondary | ICD-10-CM

## 2015-04-15 DIAGNOSIS — Z823 Family history of stroke: Secondary | ICD-10-CM | POA: Diagnosis not present

## 2015-04-15 DIAGNOSIS — I10 Essential (primary) hypertension: Secondary | ICD-10-CM | POA: Diagnosis present

## 2015-04-15 HISTORY — PX: MAXIMUM ACCESS (MAS)POSTERIOR LUMBAR INTERBODY FUSION (PLIF) 1 LEVEL: SHX6368

## 2015-04-15 SURGERY — FOR MAXIMUM ACCESS (MAS) POSTERIOR LUMBAR INTERBODY FUSION (PLIF) 1 LEVEL
Anesthesia: General | Site: Back

## 2015-04-15 MED ORDER — PHENYLEPHRINE HCL 10 MG/ML IJ SOLN
INTRAMUSCULAR | Status: DC | PRN
  Administered 2015-04-15 (×2): 40 ug via INTRAVENOUS

## 2015-04-15 MED ORDER — SODIUM CHLORIDE 0.9 % IR SOLN
Status: DC | PRN
  Administered 2015-04-15: 15:00:00

## 2015-04-15 MED ORDER — KETOROLAC TROMETHAMINE 30 MG/ML IJ SOLN
15.0000 mg | Freq: Once | INTRAMUSCULAR | Status: AC | PRN
  Administered 2015-04-15: 15 mg via INTRAVENOUS

## 2015-04-15 MED ORDER — SODIUM CHLORIDE 0.9 % IJ SOLN: 3.0000 mL | INTRAMUSCULAR | Status: DC | PRN

## 2015-04-15 MED ORDER — HEMOSTATIC AGENTS (NO CHARGE) OPTIME
TOPICAL | Status: DC | PRN
  Administered 2015-04-15: 1 via TOPICAL

## 2015-04-15 MED ORDER — LACTATED RINGERS IV SOLN
INTRAVENOUS | Status: DC
  Administered 2015-04-15 (×2): via INTRAVENOUS

## 2015-04-15 MED ORDER — PROMETHAZINE HCL 25 MG/ML IJ SOLN: 6.2500 mg | INTRAMUSCULAR | Status: DC | PRN

## 2015-04-15 MED ORDER — THROMBIN 5000 UNITS EX SOLR
CUTANEOUS | Status: DC | PRN
  Administered 2015-04-15: 15:00:00 via TOPICAL

## 2015-04-15 MED ORDER — MIDAZOLAM HCL 2 MG/2ML IJ SOLN
INTRAMUSCULAR | Status: AC
  Filled 2015-04-15: qty 2

## 2015-04-15 MED ORDER — ACETAMINOPHEN 10 MG/ML IV SOLN: 1000.0000 mg | Freq: Once | INTRAVENOUS | Status: DC

## 2015-04-15 MED ORDER — LIDOCAINE HCL (CARDIAC) 20 MG/ML IV SOLN
INTRAVENOUS | Status: AC
  Filled 2015-04-15: qty 5

## 2015-04-15 MED ORDER — CELECOXIB 200 MG PO CAPS
200.0000 mg | ORAL_CAPSULE | Freq: Two times a day (BID) | ORAL | Status: DC
  Administered 2015-04-15 – 2015-04-16 (×2): 200 mg via ORAL
  Filled 2015-04-15 (×2): qty 1

## 2015-04-15 MED ORDER — PROPOFOL 10 MG/ML IV BOLUS
INTRAVENOUS | Status: AC
  Filled 2015-04-15: qty 20

## 2015-04-15 MED ORDER — METHOCARBAMOL 500 MG PO TABS
500.0000 mg | ORAL_TABLET | Freq: Four times a day (QID) | ORAL | Status: DC | PRN
  Administered 2015-04-15 – 2015-04-16 (×2): 500 mg via ORAL
  Filled 2015-04-15: qty 1

## 2015-04-15 MED ORDER — POTASSIUM CHLORIDE IN NACL 20-0.9 MEQ/L-% IV SOLN
INTRAVENOUS | Status: DC
  Administered 2015-04-15 – 2015-04-16 (×2): via INTRAVENOUS
  Filled 2015-04-15 (×2): qty 1000

## 2015-04-15 MED ORDER — PROPOFOL 10 MG/ML IV BOLUS
INTRAVENOUS | Status: DC | PRN
  Administered 2015-04-15: 50 mg via INTRAVENOUS
  Administered 2015-04-15: 150 mg via INTRAVENOUS

## 2015-04-15 MED ORDER — MORPHINE SULFATE 2 MG/ML IJ SOLN: 1.0000 mg | INTRAMUSCULAR | Status: DC | PRN

## 2015-04-15 MED ORDER — TAMSULOSIN HCL 0.4 MG PO CAPS
0.4000 mg | ORAL_CAPSULE | Freq: Every evening | ORAL | Status: DC
  Administered 2015-04-15: 0.4 mg via ORAL
  Filled 2015-04-15: qty 1

## 2015-04-15 MED ORDER — KETOROLAC TROMETHAMINE 30 MG/ML IJ SOLN
INTRAMUSCULAR | Status: AC
  Filled 2015-04-15: qty 1

## 2015-04-15 MED ORDER — METHOCARBAMOL 500 MG PO TABS
ORAL_TABLET | ORAL | Status: AC
  Filled 2015-04-15: qty 1

## 2015-04-15 MED ORDER — ACETAMINOPHEN 325 MG PO TABS: 650.0000 mg | ORAL_TABLET | ORAL | Status: DC | PRN

## 2015-04-15 MED ORDER — THROMBIN 5000 UNITS EX SOLR
CUTANEOUS | Status: DC | PRN
  Administered 2015-04-15 (×4): 5000 [IU] via TOPICAL

## 2015-04-15 MED ORDER — ONDANSETRON HCL 4 MG/2ML IJ SOLN
INTRAMUSCULAR | Status: AC
  Filled 2015-04-15: qty 2

## 2015-04-15 MED ORDER — PHENOL 1.4 % MT LIQD: 1.0000 | OROMUCOSAL | Status: DC | PRN

## 2015-04-15 MED ORDER — ACETAMINOPHEN 650 MG RE SUPP: 650.0000 mg | RECTAL | Status: DC | PRN

## 2015-04-15 MED ORDER — ALUM & MAG HYDROXIDE-SIMETH 200-200-20 MG/5ML PO SUSP
30.0000 mL | Freq: Four times a day (QID) | ORAL | Status: DC | PRN
  Administered 2015-04-15 – 2015-04-16 (×2): 30 mL via ORAL
  Filled 2015-04-15 (×2): qty 30

## 2015-04-15 MED ORDER — DEXAMETHASONE SODIUM PHOSPHATE 4 MG/ML IJ SOLN: 4.0000 mg | Freq: Four times a day (QID) | INTRAMUSCULAR | Status: DC

## 2015-04-15 MED ORDER — LIDOCAINE HCL (CARDIAC) 20 MG/ML IV SOLN
INTRAVENOUS | Status: DC | PRN
  Administered 2015-04-15: 60 mg via INTRAVENOUS

## 2015-04-15 MED ORDER — SODIUM CHLORIDE 0.9 % IJ SOLN
3.0000 mL | Freq: Two times a day (BID) | INTRAMUSCULAR | Status: DC
  Administered 2015-04-16: 3 mL via INTRAVENOUS

## 2015-04-15 MED ORDER — SUFENTANIL CITRATE 50 MCG/ML IV SOLN
INTRAVENOUS | Status: DC | PRN
  Administered 2015-04-15: 20 ug via INTRAVENOUS
  Administered 2015-04-15: 10 ug via INTRAVENOUS

## 2015-04-15 MED ORDER — DEXAMETHASONE 4 MG PO TABS
4.0000 mg | ORAL_TABLET | Freq: Four times a day (QID) | ORAL | Status: DC
  Administered 2015-04-15 – 2015-04-16 (×4): 4 mg via ORAL
  Filled 2015-04-15 (×4): qty 1

## 2015-04-15 MED ORDER — FINASTERIDE 5 MG PO TABS
5.0000 mg | ORAL_TABLET | Freq: Every evening | ORAL | Status: DC
  Administered 2015-04-15: 5 mg via ORAL
  Filled 2015-04-15: qty 1

## 2015-04-15 MED ORDER — SUFENTANIL CITRATE 50 MCG/ML IV SOLN
INTRAVENOUS | Status: AC
  Filled 2015-04-15: qty 1

## 2015-04-15 MED ORDER — MIDAZOLAM HCL 5 MG/5ML IJ SOLN
INTRAMUSCULAR | Status: DC | PRN
  Administered 2015-04-15 (×2): 2 mg via INTRAVENOUS

## 2015-04-15 MED ORDER — HYDROMORPHONE HCL 1 MG/ML IJ SOLN
INTRAMUSCULAR | Status: AC
  Administered 2015-04-15: 0.5 mg via INTRAVENOUS
  Filled 2015-04-15: qty 1

## 2015-04-15 MED ORDER — 0.9 % SODIUM CHLORIDE (POUR BTL) OPTIME
TOPICAL | Status: DC | PRN
  Administered 2015-04-15: 1000 mL

## 2015-04-15 MED ORDER — SODIUM CHLORIDE 0.9 % IV SOLN: 250.0000 mL | INTRAVENOUS | Status: DC

## 2015-04-15 MED ORDER — MENTHOL 3 MG MT LOZG
1.0000 | LOZENGE | OROMUCOSAL | Status: DC | PRN
Start: 2015-04-15 — End: 2015-04-16

## 2015-04-15 MED ORDER — ONDANSETRON HCL 4 MG/2ML IJ SOLN
INTRAMUSCULAR | Status: DC | PRN
  Administered 2015-04-15: 4 mg via INTRAVENOUS

## 2015-04-15 MED ORDER — ONDANSETRON HCL 4 MG/2ML IJ SOLN: 4.0000 mg | INTRAMUSCULAR | Status: DC | PRN

## 2015-04-15 MED ORDER — ACETAMINOPHEN 10 MG/ML IV SOLN
INTRAVENOUS | Status: AC
  Administered 2015-04-15: 1000 mg via INTRAVENOUS
  Filled 2015-04-15: qty 100

## 2015-04-15 MED ORDER — CEFAZOLIN SODIUM 1-5 GM-% IV SOLN
1.0000 g | Freq: Three times a day (TID) | INTRAVENOUS | Status: AC
  Administered 2015-04-15 – 2015-04-16 (×2): 1 g via INTRAVENOUS
  Filled 2015-04-15 (×2): qty 50

## 2015-04-15 MED ORDER — PHENYLEPHRINE HCL 10 MG/ML IJ SOLN
10.0000 mg | INTRAVENOUS | Status: DC | PRN
  Administered 2015-04-15: 10 ug/min via INTRAVENOUS

## 2015-04-15 MED ORDER — METHOCARBAMOL 1000 MG/10ML IJ SOLN
500.0000 mg | Freq: Four times a day (QID) | INTRAVENOUS | Status: DC | PRN
  Filled 2015-04-15: qty 5

## 2015-04-15 MED ORDER — HYDROMORPHONE HCL 1 MG/ML IJ SOLN
0.2500 mg | INTRAMUSCULAR | Status: DC | PRN
  Administered 2015-04-15 (×4): 0.5 mg via INTRAVENOUS

## 2015-04-15 MED ORDER — LISINOPRIL 10 MG PO TABS
10.0000 mg | ORAL_TABLET | Freq: Every morning | ORAL | Status: DC
  Administered 2015-04-16: 10 mg via ORAL
  Filled 2015-04-15: qty 1

## 2015-04-15 MED ORDER — SUCCINYLCHOLINE CHLORIDE 20 MG/ML IJ SOLN
INTRAMUSCULAR | Status: DC | PRN
  Administered 2015-04-15: 100 mg via INTRAVENOUS

## 2015-04-15 MED ORDER — MEPERIDINE HCL 25 MG/ML IJ SOLN: 6.2500 mg | INTRAMUSCULAR | Status: DC | PRN

## 2015-04-15 MED ORDER — OXYCODONE-ACETAMINOPHEN 5-325 MG PO TABS
1.0000 | ORAL_TABLET | ORAL | Status: DC | PRN
  Administered 2015-04-15: 1 via ORAL
  Administered 2015-04-16: 2 via ORAL
  Administered 2015-04-16: 1 via ORAL
  Filled 2015-04-15 (×2): qty 1
  Filled 2015-04-15: qty 2

## 2015-04-15 MED ORDER — HYDROCHLOROTHIAZIDE 25 MG PO TABS
50.0000 mg | ORAL_TABLET | Freq: Every day | ORAL | Status: DC
  Administered 2015-04-15 – 2015-04-16 (×2): 50 mg via ORAL
  Filled 2015-04-15 (×2): qty 2

## 2015-04-15 MED ORDER — BUPIVACAINE HCL (PF) 0.25 % IJ SOLN
INTRAMUSCULAR | Status: DC | PRN
  Administered 2015-04-15: 10 mL

## 2015-04-15 SURGICAL SUPPLY — 70 items
BAG DECANTER FOR FLEXI CONT (MISCELLANEOUS) ×3 IMPLANT
BENZOIN TINCTURE PRP APPL 2/3 (GAUZE/BANDAGES/DRESSINGS) ×3 IMPLANT
BIT DRILL PLIF MAS 5.0MM DISP (DRILL) ×1 IMPLANT
BLADE CLIPPER SURG (BLADE) IMPLANT
BONE MATRIX OSTEOCEL PRO SM (Bone Implant) ×6 IMPLANT
BUR MATCHSTICK NEURO 3.0 LAGG (BURR) ×3 IMPLANT
CAGE COROENT 12X28X4 (Cage) ×6 IMPLANT
CANISTER SUCT 3000ML PPV (MISCELLANEOUS) ×3 IMPLANT
CLIP NEUROVISION LG (CLIP) ×3 IMPLANT
CLOSURE WOUND 1/2 X4 (GAUZE/BANDAGES/DRESSINGS) ×1
CONT SPEC 4OZ CLIKSEAL STRL BL (MISCELLANEOUS) ×6 IMPLANT
COVER BACK TABLE 24X17X13 BIG (DRAPES) IMPLANT
COVER BACK TABLE 60X90IN (DRAPES) ×3 IMPLANT
DERMABOND ADHESIVE PROPEN (GAUZE/BANDAGES/DRESSINGS) ×2
DERMABOND ADVANCED .7 DNX6 (GAUZE/BANDAGES/DRESSINGS) ×1 IMPLANT
DRAPE C-ARM 42X72 X-RAY (DRAPES) ×3 IMPLANT
DRAPE C-ARMOR (DRAPES) ×3 IMPLANT
DRAPE LAPAROTOMY 100X72X124 (DRAPES) ×3 IMPLANT
DRAPE POUCH INSTRU U-SHP 10X18 (DRAPES) ×3 IMPLANT
DRAPE SURG 17X23 STRL (DRAPES) ×3 IMPLANT
DRILL PLIF MAS 5.0MM DISP (DRILL) ×3
DRSG OPSITE 4X5.5 SM (GAUZE/BANDAGES/DRESSINGS) IMPLANT
DRSG OPSITE POSTOP 4X8 (GAUZE/BANDAGES/DRESSINGS) ×3 IMPLANT
DRSG TELFA 3X8 NADH (GAUZE/BANDAGES/DRESSINGS) IMPLANT
DURAPREP 26ML APPLICATOR (WOUND CARE) ×3 IMPLANT
ELECT REM PT RETURN 9FT ADLT (ELECTROSURGICAL) ×3
ELECTRODE REM PT RTRN 9FT ADLT (ELECTROSURGICAL) ×1 IMPLANT
EVACUATOR 1/8 PVC DRAIN (DRAIN) ×3 IMPLANT
GAUZE SPONGE 4X4 16PLY XRAY LF (GAUZE/BANDAGES/DRESSINGS) IMPLANT
GLOVE BIO SURGEON STRL SZ8 (GLOVE) ×9 IMPLANT
GLOVE BIOGEL PI IND STRL 7.5 (GLOVE) ×4 IMPLANT
GLOVE BIOGEL PI INDICATOR 7.5 (GLOVE) ×8
GLOVE ECLIPSE 7.5 STRL STRAW (GLOVE) ×9 IMPLANT
GLOVE INDICATOR 8.5 STRL (GLOVE) ×3 IMPLANT
GLOVE SURG SS PI 7.0 STRL IVOR (GLOVE) ×6 IMPLANT
GOWN STRL REUS W/ TWL LRG LVL3 (GOWN DISPOSABLE) IMPLANT
GOWN STRL REUS W/ TWL XL LVL3 (GOWN DISPOSABLE) ×4 IMPLANT
GOWN STRL REUS W/TWL 2XL LVL3 (GOWN DISPOSABLE) IMPLANT
GOWN STRL REUS W/TWL LRG LVL3 (GOWN DISPOSABLE)
GOWN STRL REUS W/TWL XL LVL3 (GOWN DISPOSABLE) ×8
HEMOSTAT POWDER KIT SURGIFOAM (HEMOSTASIS) ×3 IMPLANT
KIT BASIN OR (CUSTOM PROCEDURE TRAY) ×3 IMPLANT
KIT NEEDLE NVM5 EMG ELECT (KITS) ×1 IMPLANT
KIT NEEDLE NVM5 EMG ELECTRODE (KITS) ×2
KIT ROOM TURNOVER OR (KITS) ×3 IMPLANT
MILL MEDIUM DISP (BLADE) ×3 IMPLANT
NEEDLE HYPO 25X1 1.5 SAFETY (NEEDLE) ×3 IMPLANT
NS IRRIG 1000ML POUR BTL (IV SOLUTION) ×3 IMPLANT
PACK LAMINECTOMY NEURO (CUSTOM PROCEDURE TRAY) ×3 IMPLANT
PAD ARMBOARD 7.5X6 YLW CONV (MISCELLANEOUS) ×9 IMPLANT
ROD 100MM (Rod) ×4 IMPLANT
ROD SPNL 100XPREBNT NS MAS (Rod) ×2 IMPLANT
SCREW LOCK (Screw) ×16 IMPLANT
SCREW LOCK FXNS SPNE MAS PL (Screw) ×8 IMPLANT
SCREW SHANK 5.0X35 (Screw) ×6 IMPLANT
SCREW TULIP 5.5 (Screw) ×6 IMPLANT
SPONGE LAP 4X18 X RAY DECT (DISPOSABLE) IMPLANT
SPONGE SURGIFOAM ABS GEL 100 (HEMOSTASIS) ×3 IMPLANT
STRIP CLOSURE SKIN 1/2X4 (GAUZE/BANDAGES/DRESSINGS) ×2 IMPLANT
SUT VIC AB 0 CT1 18XCR BRD8 (SUTURE) ×2 IMPLANT
SUT VIC AB 0 CT1 8-18 (SUTURE) ×4
SUT VIC AB 2-0 CP2 18 (SUTURE) ×3 IMPLANT
SUT VIC AB 3-0 SH 8-18 (SUTURE) ×6 IMPLANT
SYR 20ML ECCENTRIC (SYRINGE) ×3 IMPLANT
SYR 3ML LL SCALE MARK (SYRINGE) IMPLANT
TOWEL OR 17X24 6PK STRL BLUE (TOWEL DISPOSABLE) ×3 IMPLANT
TOWEL OR 17X26 10 PK STRL BLUE (TOWEL DISPOSABLE) ×3 IMPLANT
TRAY FOLEY CATH 14FRSI W/METER (CATHETERS) IMPLANT
TRAY FOLEY CATH 16FRSI W/METER (SET/KITS/TRAYS/PACK) ×3 IMPLANT
WATER STERILE IRR 1000ML POUR (IV SOLUTION) ×3 IMPLANT

## 2015-04-15 NOTE — Progress Notes (Addendum)
Per [facility] [place] in [employer], ready for pt now, will insert IV once transported to 3rd floor. Pt reports that he is unable to hear without hearing aid in right ear and that he was allowed to keep hearing aid in until speaking with anesthesia with previous surgery. Labelled cup sent with pt. Pt left short stay with right hearing aid in place.

## 2015-04-15 NOTE — Transfer of Care (Signed)
Immediate Anesthesia Transfer of Care Note  Patient: Jay Tran  Procedure(s) Performed: Procedure(s): Maximum Access Surgery Posterior Lumbar Interbody Fusion Lumbar two-Lumbar three extension of hardware (N/A)  Patient Location: PACU  Anesthesia Type:General  Level of Consciousness: awake and oriented  Airway & Oxygen Therapy: Patient Spontanous Breathing and Patient connected to nasal cannula oxygen  Post-op Assessment: Report given to RN, Post -op Vital signs reviewed and stable and Patient moving all extremities  Post vital signs: Reviewed and stable  Last Vitals:  Filed Vitals:   04/15/15 1114  BP: 177/73  Pulse: 65  Temp: 37.1 C  Resp: 20    Complications: No apparent anesthesia complications

## 2015-04-15 NOTE — Progress Notes (Signed)
pts hearing aid returned

## 2015-04-15 NOTE — H&P (Signed)
Subjective: Patient is a 81 y.o. male admitted for PLIF L2-3. Onset of symptoms was several months ago, gradually worsening since that time.  The pain is rated unremitting, and is located at the across the lower back and radiates to legs. The pain is described as aching and occurs all day. The symptoms have been progressive. Symptoms are exacerbated by exercise. MRI or CT showed adjacent level stenosis.   Past Medical History  Diagnosis Date  . PONV (postoperative nausea and vomiting)   . Hypertension   . BPH (benign prostatic hypertrophy)   . Hyperlipemia   . Arthritis   . Hearing aid worn     Hx: of right ear  . Seasonal allergies     Hx: of  . 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     Hx: of  . GERD (gastroesophageal reflux disease)     Past Surgical History  Procedure Laterality Date  . Rotator cuff repair      right shoulder - 3 times  . Tonsillectomy    . Lumbar laminectomy  10/26/2012    Procedure: MICRODISCECTOMY LUMBAR LAMINECTOMY;  Surgeon: Marybelle Killings, MD;  Location: [facility];  Service: Orthopedics;  Laterality: Right;  Right L4-5 Microdiscectomy  . Hernia repair    . Colonoscopy      Hx: of    Prior to Admission medications   Medication Sig Start Date End Date Taking? Authorizing Provider  finasteride (PROSCAR) 5 MG tablet Take 5 mg by mouth every evening.    Yes Historical Provider, MD  hydrochlorothiazide (HYDRODIURIL) 50 MG tablet Take 50 mg by mouth daily.    Yes Historical Provider, MD  lisinopril (PRINIVIL,ZESTRIL) 20 MG tablet Take 10 mg by mouth every morning.   Yes Historical Provider, MD  Omega-3 Fatty Acids (FISH OIL) 300 MG CAPS Take 600 mg by mouth daily.   Yes Historical Provider, MD  ranitidine (ZANTAC) 150 MG tablet Take 150 mg by mouth as needed for heartburn.   Yes Historical Provider, MD  Tamsulosin HCl (FLOMAX) 0.4 MG CAPS Take 0.4 mg by mouth every evening.    Yes Historical Provider, MD  methocarbamol (ROBAXIN) 500 MG tablet Take 1 tablet  (500 mg total) by mouth every 6 (six) hours as needed. Patient not taking: Reported on 04/06/2015 05/03/13   Earnie Larsson, MD  oxyCODONE-acetaminophen (PERCOCET/ROXICET) 5-325 MG per tablet Take 1-2 tablets by mouth every 4 (four) hours as needed. Patient not taking: Reported on 04/06/2015 05/03/13   Earnie Larsson, MD  phenylephrine (SUDAFED PE) 10 MG TABS Take 10 mg by mouth every 4 (four) hours as needed.    Historical Provider, MD   No Known Allergies  History  Substance Use Topics  . Smoking status: Former Smoker    Quit date: 11/12/1963  . Smokeless tobacco: Never Used  . Alcohol Use: No    Family History  Problem Relation Age of Onset  . Stroke Mother   . Cancer - Prostate Father      Review of Systems  Positive ROS: neg  All other systems have been reviewed and were otherwise negative with the exception of those mentioned in the HPI and as above.  Objective: Vital signs in last 24 hours: Temp:  [98.7 F (37.1 C)] 98.7 F (37.1 C) (04/27 1114) Pulse Rate:  [65] 65 (04/27 1114) Resp:  [20] 20 (04/27 1114) BP: (177)/(73) 177/73 mmHg (04/27 1114) SpO2:  [97 %] 97 % (04/27 1114) Weight:  [196 lb (88.905 kg)] 196 lb (  88.905 kg) (04/27 1114)  General Appearance: Alert, cooperative, no distress, appears stated age Head: Normocephalic, without obvious abnormality, atraumatic Eyes: PERRL, conjunctiva/corneas clear, EOM's intact    Neck: Supple, symmetrical, trachea midline Back: Symmetric, no curvature, ROM normal, no CVA tenderness Lungs:  respirations unlabored Heart: Regular rate and rhythm Abdomen: Soft, non-tender Extremities: Extremities normal, atraumatic, no cyanosis or edema Pulses: 2+ and symmetric all extremities Skin: Skin color, texture, turgor normal, no rashes or lesions  NEUROLOGIC:   Mental status: Alert and oriented x4,  no aphasia, good attention span, fund of knowledge, and memory Motor Exam - grossly normal Sensory Exam - grossly normal Reflexes:  1+ Coordination - grossly normal Gait - grossly normal Balance - grossly normal Cranial Nerves: I: smell Not tested  II: visual acuity  OS: nl    OD: nl  II: visual fields Full to confrontation  II: pupils Equal, round, reactive to light  III,VII: ptosis None  III,IV,VI: extraocular muscles  Full ROM  V: mastication Normal  V: facial light touch sensation  Normal  V,VII: corneal reflex  Present  VII: facial muscle function - upper  Normal  VII: facial muscle function - lower Normal  VIII: hearing Not tested  IX: soft palate elevation  Normal  IX,X: gag reflex Present  XI: trapezius strength  5/5  XI: sternocleidomastoid strength 5/5  XI: neck flexion strength  5/5  XII: tongue strength  Normal    Data Review Lab Results  Component Value Date   WBC 12.4* 04/08/2015   HGB 14.8 04/08/2015   HCT 42.0 04/08/2015   MCV 93.1 04/08/2015   PLT 307 04/08/2015   Lab Results  Component Value Date   NA 138 04/08/2015   K 3.7 04/08/2015   CL 99 04/08/2015   CO2 28 04/08/2015   BUN 12 04/08/2015   CREATININE 0.98 04/08/2015   GLUCOSE 108* 04/08/2015   Lab Results  Component Value Date   INR 1.01 04/08/2015    Assessment/Plan: Patient admitted for PLIF. Patient has failed a reasonable attempt at conservative therapy.  I explained the condition and procedure to the patient and answered any questions.  Patient wishes to proceed with procedure as planned. Understands risks/ benefits and typical outcomes of procedure.   Puanani Gene S 04/15/2015 1:16 PM

## 2015-04-15 NOTE — Anesthesia Postprocedure Evaluation (Signed)
Anesthesia Post Note  Patient: Jay Tran  Procedure(s) Performed: Procedure(s) (LRB): Maximum Access Surgery Posterior Lumbar Interbody Fusion Lumbar two-Lumbar three extension of hardware (N/A)  Anesthesia type: General  Patient location: PACU  Post pain: Pain level controlled  Post assessment: Post-op Vital signs reviewed  Last Vitals: BP 148/78 mmHg  Pulse 76  Temp(Src) 36.5 C (Oral)  Resp 15  Ht 5\' 9"  (1.753 m)  Wt 196 lb (88.905 kg)  BMI 28.93 kg/m2  SpO2 93%  Post vital signs: Reviewed  Level of consciousness: sedated  Complications: No apparent anesthesia complications

## 2015-04-15 NOTE — Progress Notes (Signed)
Pt arrived to 4N25. Alert and oriented x4. SCDs on. Pt rates pain 5/10 and states that he does not need pain medication right now. Pt oriented to room. Call bell within reach and bed alarm on. Will continue to monitor.

## 2015-04-15 NOTE — Anesthesia Procedure Notes (Signed)
Procedure Name: Intubation Date/Time: 04/15/2015 1:47 PM Performed by: Melina Copa, Brigido Mera R Pre-anesthesia Checklist: Patient identified, Emergency Drugs available, Suction available, Patient being monitored and Timeout performed Patient Re-evaluated:Patient Re-evaluated prior to inductionOxygen Delivery Method: Circle system utilized Preoxygenation: Pre-oxygenation with 100% oxygen Intubation Type: IV induction Ventilation: Mask ventilation without difficulty Laryngoscope Size: Mac and 4 Grade View: Grade I Tube type: Oral Tube size: 8.0 mm Number of attempts: 1 Airway Equipment and Method: Stylet Placement Confirmation: ETT inserted through vocal cords under direct vision,  positive ETCO2 and breath sounds checked- equal and bilateral Secured at: 22 cm Tube secured with: Tape Dental Injury: Teeth and Oropharynx as per pre-operative assessment

## 2015-04-15 NOTE — Progress Notes (Signed)
Precious Haws, RN in and inserted IV prior to pt transport to 3rd floor.

## 2015-04-15 NOTE — Op Note (Signed)
04/15/2015  4:45 PM  PATIENT:  Jay Tran  81 y.o. male  PRE-OPERATIVE DIAGNOSIS:  Adjacent level Lumbar stenosis L2-3 with back and leg pain  POST-OPERATIVE DIAGNOSIS:  Same  PROCEDURE:   1. Decompressive lumbar laminectomy L2-3 requiring more work than would be required for a simple exposure of the disk for PLIF in order to adequately decompress the neural elements and address the spinal stenosis 2. Posterior lumbar interbody fusion L2-3 using PEEK interbody cages packed with morcellized allograft and autograft 3. Posterior fixation L2-L5 using cortical pedicle screws.  4. Removal of hardware L3-L5 and exploration of fusion  SURGEON:  Sherley Bounds, MD  ASSISTANTS: Dr. Weston Settle  ANESTHESIA:  General  EBL: 150 ml  Total I/O In: 1000 [I.V.:1000] Out: 600 [Urine:450; Blood:150]  BLOOD ADMINISTERED:none  DRAINS: Hemovac   INDICATION FOR PROCEDURE: This patient underwent a 2 level PLIF a little over a year ago. He presented with severe back and bilateral leg pain. CT scan suggested solid arthrodesis and MRI scan showed adjacent level stenosis at L2-3. He tried medical management including injections without relief. I recommended decompression and instrumented fusion. Patient understood the risks, benefits, and alternatives and potential outcomes and wished to proceed.  PROCEDURE DETAILS:  The patient was brought to the operating room. After induction of generalized endotracheal anesthesia the patient was rolled into the prone position on chest rolls and all pressure points were padded. The patient's lumbar region was cleaned and then prepped with DuraPrep and draped in the usual sterile fashion. Anesthesia was injected and then a dorsal midline incision was made and carried down to the lumbosacral fascia. The fascia was opened and the paraspinous musculature was taken down in a subperiosteal fashion to expose L2-3 as well as well as the previously placed hardware. A self-retaining  retractor was placed. I removed the locking caps and the rods from the previously placed hardware. I pulled on each screw to test their purchase. All screws moved in unison when pulling on any single screw. This suggested solid arthrodesis. Intraoperative fluoroscopy confirmed my level, and I started with placement of the L2 cortical pedicle screws. The pedicle screw entry zones were identified utilizing surface landmarks and  AP and lateral fluoroscopy. I scored the cortex with the high-speed drill and then used the hand drill and EMG monitoring to drill an upward and outward direction into the pedicle. I then tapped line to line, and the tap was also monitored. I then placed a 5-0 x 35 mm cortical pedicle screw into the pedicles of L2 bilaterally. I then turned my attention to the decompression and the spinous process was removed and complete lumbar laminectomies, hemi- facetectomies, and foraminotomies were performed at L2-3. The patient had significant spinal stenosis and this required more work than would be required for a simple exposure of the disc for posterior lumbar interbody fusion. Much more generous decompression was undertaken in order to adequately decompress the neural elements and address the patient's leg pain. The yellow ligament was removed to expose the underlying dura and nerve roots, and generous foraminotomies were performed to adequately decompress the neural elements. Both the exiting and traversing nerve roots were decompressed on both sides until a coronary dilator passed easily along the nerve roots. Once the decompression was complete, I turned my attention to the posterior lower lumbar interbody fusion. The epidural venous vasculature was coagulated and cut sharply. Disc space was incised and the initial discectomy was performed with pituitary rongeurs. The disc space was distracted with  sequential distractors to a height of 12 mm. We then used a series of scrapers and shavers to  prepare the endplates for fusion. The midline was prepared with Epstein curettes. Once the complete discectomy was finished, we packed an appropriate sized peek interbody cage with local autograft and morcellized allograft, gently retracted the nerve root, and tapped the cage into position at L2-3.  The midline between the cages was packed with morselized autograft and allograft.  We then placed lordotic rods into the multiaxial screw heads of the pedicle screws from L2-L5 and locked these in position with the locking caps and anti-torque device. We then checked our construct with AP and lateral fluoroscopy. Irrigated with copious amounts of bacitracin-containing saline solution. Placed a medium Hemovac drain through separate stab incision. Inspected the nerve roots once again to assure adequate decompression, lined to the dura with Gelfoam, and closed the muscle and the fascia with 0 Vicryl. Closed the subcutaneous tissues with 2-0 Vicryl and subcuticular tissues with 3-0 Vicryl. The skin was closed with benzoin and Steri-Strips. Dressing was then applied, the patient was awakened from general anesthesia and transported to the recovery room in stable condition. At the end of the procedure all sponge, needle and instrument counts were correct.   PLAN OF CARE: Admit to inpatient   PATIENT DISPOSITION:  PACU - hemodynamically stable.   Delay start of Pharmacological VTE agent (>24hrs) due to surgical blood loss or risk of bleeding:  yes

## 2015-04-15 NOTE — Anesthesia Preprocedure Evaluation (Addendum)
Anesthesia Evaluation  Patient identified by MRN, date of birth, ID band Patient awake    Reviewed: Allergy & Precautions, NPO status , Patient's Chart, lab work & pertinent test results  History of Anesthesia Complications (+) PONV and history of anesthetic complications  Airway Mallampati: II  TM Distance: >3 FB Neck ROM: Full    Dental no notable dental hx.    Pulmonary former smoker,  breath sounds clear to auscultation  Pulmonary exam normal       Cardiovascular hypertension, Pt. on medications DVT Rhythm:Regular Rate:Normal     Neuro/Psych negative neurological ROS  negative psych ROS   GI/Hepatic Neg liver ROS, GERD-  Medicated,  Endo/Other  negative endocrine ROS  Renal/GU negative Renal ROS     Musculoskeletal  (+) Arthritis -,   Abdominal   Peds  Hematology negative hematology ROS (+)   Anesthesia Other Findings   Reproductive/Obstetrics negative OB ROS                            Anesthesia Physical Anesthesia Plan  ASA: III  Anesthesia Plan: General   Post-op Pain Management:    Induction: Intravenous  Airway Management Planned: Oral ETT  Additional Equipment:   Intra-op Plan:   Post-operative Plan: Extubation in OR  Informed Consent: I have reviewed the patients History and Physical, chart, labs and discussed the procedure including the risks, benefits and alternatives for the proposed anesthesia with the patient or authorized representative who has indicated his/her understanding and acceptance.   Dental advisory given  Plan Discussed with: CRNA  Anesthesia Plan Comments:         Anesthesia Quick Evaluation                                  Anesthesia Evaluation  Patient identified by MRN, date of birth, ID band Patient awake    Reviewed: Allergy & Precautions, H&P , NPO status   History of Anesthesia Complications (+)  PONV  Airway Mallampati: I TM Distance: >3 FB Neck ROM: Full    Dental   Pulmonary          Cardiovascular hypertension, Pt. on medications + Peripheral Vascular Disease     Neuro/Psych    GI/Hepatic   Endo/Other    Renal/GU      Musculoskeletal   Abdominal   Peds  Hematology   Anesthesia Other Findings   Reproductive/Obstetrics                           Anesthesia Physical Anesthesia Plan  ASA: II  Anesthesia Plan: General   Post-op Pain Management:    Induction: Intravenous  Airway Management Planned: Oral ETT  Additional Equipment:   Intra-op Plan:   Post-operative Plan: Extubation in OR  Informed Consent: I have reviewed the patients History and Physical, chart, labs and discussed the procedure including the risks, benefits and alternatives for the proposed anesthesia with the patient or authorized representative who has indicated his/her understanding and acceptance.     Plan Discussed with: CRNA and Surgeon  Anesthesia Plan Comments:         Anesthesia Quick Evaluation

## 2015-04-16 ENCOUNTER — Encounter (HOSPITAL_COMMUNITY): Payer: Self-pay | Admitting: Neurological Surgery

## 2015-04-16 MED ORDER — OXYCODONE-ACETAMINOPHEN 5-325 MG PO TABS: 1.0000 | ORAL_TABLET | Freq: Four times a day (QID) | ORAL | Status: DC | PRN

## 2015-04-16 MED ORDER — TIZANIDINE HCL 4 MG PO TABS: 4.0000 mg | ORAL_TABLET | Freq: Three times a day (TID) | ORAL | Status: DC | PRN

## 2015-04-16 NOTE — Progress Notes (Signed)
Subjective: Patient reports he's doing well. No leg pain or NTW. Back sore  Objective: Vital signs in last 24 hours: Temp:  [97.7 F (36.5 C)-98.8 F (37.1 C)] 97.9 F (36.6 C) (04/28 3536) Pulse Rate:  [65-93] 65 (04/28 0613) Resp:  [12-20] 20 (04/28 0613) BP: (115-177)/(56-82) 134/56 mmHg (04/28 0613) SpO2:  [93 %-97 %] 96 % (04/28 1443) Weight:  [196 lb (88.905 kg)] 196 lb (88.905 kg) (04/27 1114)  Intake/Output from previous day: 04/27 0730 - 04/28 0729 In: 1500 [I.V.:1500] Out: 2150 [Urine:2000; Blood:150] Intake/Output this shift:    Neurologic: Grossly normal  Lab Results: Lab Results  Component Value Date   WBC 12.4* 04/08/2015   HGB 14.8 04/08/2015   HCT 42.0 04/08/2015   MCV 93.1 04/08/2015   PLT 307 04/08/2015   Lab Results  Component Value Date   INR 1.01 04/08/2015   BMET Lab Results  Component Value Date   NA 138 04/08/2015   K 3.7 04/08/2015   CL 99 04/08/2015   CO2 28 04/08/2015   GLUCOSE 108* 04/08/2015   BUN 12 04/08/2015   CREATININE 0.98 04/08/2015   CALCIUM 9.4 04/08/2015    Studies/Results: Dg Lumbar Spine 2-3 Views  04/15/2015   CLINICAL DATA:  L2-3 PLIF extension of hardware 4 stenosis.  EXAM: DG C-ARM 61-120 MIN; LUMBAR SPINE - 2-3 VIEW  TECHNIQUE: Two views obtained via intraoperative C-arm radiography were submitted.  CONTRAST:  No contrast.  FLUOROSCOPY TIME:  Radiation Exposure Index (as provided by the fluoroscopic device):  If the device does not provide the exposure index:  Fluoroscopy Time (in minutes and seconds):  38 seconds  Number of Acquired Images:  2 images  COMPARISON:  03/28/2015.  FINDINGS: There are transpedicular screws and posterior rods which extend fro L2 through L5. Interbody spacer material is noted at L2-3 L3-4 and L4-5. The alignment is anatomic. No complications.  IMPRESSION: 1. Interval extension of laminectomy and posterior fixation hardware to include the L2-3 level.   Electronically Signed   By: Kerby Moors M.D.   On: 04/15/2015 16:39   Dg C-arm 1-60 Min  04/15/2015   CLINICAL DATA:  L2-3 PLIF extension of hardware 4 stenosis.  EXAM: DG C-ARM 61-120 MIN; LUMBAR SPINE - 2-3 VIEW  TECHNIQUE: Two views obtained via intraoperative C-arm radiography were submitted.  CONTRAST:  No contrast.  FLUOROSCOPY TIME:  Radiation Exposure Index (as provided by the fluoroscopic device):  If the device does not provide the exposure index:  Fluoroscopy Time (in minutes and seconds):  38 seconds  Number of Acquired Images:  2 images  COMPARISON:  03/28/2015.  FINDINGS: There are transpedicular screws and posterior rods which extend fro L2 through L5. Interbody spacer material is noted at L2-3 L3-4 and L4-5. The alignment is anatomic. No complications.  IMPRESSION: 1. Interval extension of laminectomy and posterior fixation hardware to include the L2-3 level.   Electronically Signed   By: Kerby Moors M.D.   On: 04/15/2015 16:39    Assessment/Plan: Doing well, mobilize today, watch for urinary retention   LOS: 1 day    Ramia Sidney S 04/16/2015, 7:37 AM

## 2015-04-16 NOTE — Progress Notes (Signed)
Utilization review completed.  

## 2015-04-16 NOTE — Evaluation (Signed)
Physical Therapy Evaluation Patient Details Name: Jay Tran MRN: [id number] DOB: 11-18-1934 Today's Date: 04/16/2015   History of Present Illness  81 y.o. male with Hx of RLE DVT, s/p Maximum Access Surgery Posterior Lumbar Interbody Fusion Lumbar two-Lumbar three.  Clinical Impression  Patient is s/p above surgery presenting with functional limitations due to the deficits listed below (see PT Problem List). Ambulates generally well with a rolling walker for support. No buckling noted. Safely completed stair training with wife present and actively participating. Reviewed back precautions and safety with mobility. Feel patient is adequate for d/c from a mobility standpoint when medically ready. Patient will benefit from skilled PT to increase their independence and safety with mobility to allow discharge to the venue listed below.       Follow Up Recommendations No PT follow up    Equipment Recommendations  None recommended by PT    Recommendations for Other Services       Precautions / Restrictions Precautions Precautions: Back Precaution Booklet Issued: Yes (comment) Precaution Comments: handout provided Required Braces or Orthoses: Spinal Brace Spinal Brace: Lumbar corset;Applied in sitting position Restrictions Weight Bearing Restrictions: No      Mobility  Bed Mobility Overal bed mobility: Needs Assistance Bed Mobility: Supine to Sit     Supine to sit: Min guard     General bed mobility comments: incr time and mod v/c   Transfers Overall transfer level: Needs assistance Equipment used: Rolling walker (2 wheeled) Transfers: Sit to/from Stand Sit to Stand: Supervision         General transfer comment: cues for hand placement and safety  Ambulation/Gait Ambulation/Gait assistance: Supervision Ambulation Distance (Feet): 115 Feet (x2) Assistive device: Rolling walker (2 wheeled) Gait Pattern/deviations: Step-through pattern;Decreased stride length;Trunk  flexed Gait velocity: decreased   General Gait Details: Supervision for safety. No buckling noted.  VC for upright posture and walker placement for proximity, especially with turns.  Stairs Stairs: Yes Stairs assistance: Min assist Stair Management: No rails;Step to pattern;Backwards;With walker Number of Stairs: 2 (x2) General stair comments: Educated patient and wife on safe stair navigation techniques using backwards approach with min assist from wife to stabilize RW. Performed safely with correct sequencing on second attempt.  Wheelchair Mobility    Modified Rankin (Stroke Patients Only)       Balance Overall balance assessment: Needs assistance Sitting-balance support: No upper extremity supported;Feet supported Sitting balance-Leahy Scale: Good     Standing balance support: Single extremity supported;During functional activity Standing balance-Leahy Scale: Fair                               Pertinent Vitals/Pain Pain Assessment: 0-10 Pain Score: 6  Pain Location: abdoment feel like i am going to pop I am so full Pain Descriptors / Indicators: Discomfort Pain Intervention(s): Monitored during session;Repositioned    Home Living Family/patient expects to be discharged to:: Private residence Living Arrangements: Spouse/significant other Available Help at Discharge: Family Type of Home: House Home Access: Stairs to enter Entrance Stairs-Rails: None Technical brewer of Steps: 2 Home Layout: One level Home Equipment: Bedside commode;Walker - 2 wheels Additional Comments: back brace    Prior Function Level of Independence: Independent with assistive device(s)               Hand Dominance   Dominant Hand: Right    Extremity/Trunk Assessment   Upper Extremity Assessment: Overall WFL for tasks assessed  Lower Extremity Assessment: Defer to PT evaluation      Cervical / Trunk Assessment: Other exceptions (s/p surg)   Communication   Communication: No difficulties  Cognition Arousal/Alertness: Awake/alert Behavior During Therapy: WFL for tasks assessed/performed Overall Cognitive Status: Within Functional Limits for tasks assessed                      General Comments      Exercises General Exercises - Lower Extremity Ankle Circles/Pumps: AROM;Both;10 reps;Seated Long Arc Quad: Strengthening;Both;10 reps;Seated      Assessment/Plan    PT Assessment Patient needs continued PT services  PT Diagnosis Difficulty walking;Abnormality of gait;Generalized weakness;Acute pain   PT Problem List Decreased strength;Decreased activity tolerance;Decreased balance;Decreased mobility;Decreased knowledge of use of DME;Pain  PT Treatment Interventions DME instruction;Gait training;Stair training;Functional mobility training;Therapeutic activities;Therapeutic exercise;Balance training;Neuromuscular re-education;Patient/family education;Modalities   PT Goals (Current goals can be found in the Care Plan section) Acute Rehab PT Goals Patient Stated Goal: Go home PT Goal Formulation: With patient Time For Goal Achievement: 04/23/15 Potential to Achieve Goals: Good    Frequency Min 5X/week   Barriers to discharge        Co-evaluation               End of Session Equipment Utilized During Treatment: Gait belt Activity Tolerance: Patient tolerated treatment well Patient left: in chair;with call bell/phone within reach;with family/visitor present Nurse Communication: Mobility status         Time: 1201-1223 PT Time Calculation (min) (ACUTE ONLY): 22 min   Charges:   PT Evaluation $Initial PT Evaluation Tier I: 1 Procedure     PT G CodesEllouise Newer 04/16/2015, 1:45 PM Elayne Snare, [place]

## 2015-04-16 NOTE — Progress Notes (Signed)
Patient is discharged from room 4N25 at this time. Alert and in stable condition. IV site d/c'd. Instructions read to patient and wife and understanding verbalized. Left unit via wheelchair with all belongings at side.

## 2015-04-16 NOTE — Evaluation (Signed)
Occupational Therapy Evaluation Patient Details Name: Jay Tran MRN: [id number] DOB: 05-25-1934 Today's Date: 04/16/2015    History of Present Illness 81 y.o. male with Hx of RLE DVT, s/p Maximum Access Surgery Posterior Lumbar Interbody Fusion Lumbar two-Lumbar three.   Clinical Impression   Patient is s/p PLIF surgery resulting in functional limitations due to the deficits listed below (see OT problem list). PTA independent with all alds. Patient will benefit from skilled OT acutely to increase independence and safety with ADLS to allow discharge home without OT needs.     Follow Up Recommendations  No OT follow up    Equipment Recommendations  None recommended by OT    Recommendations for Other Services       Precautions / Restrictions Precautions Precautions: Back Precaution Booklet Issued: Yes (comment) Precaution Comments: handout provided Required Braces or Orthoses: Spinal Brace Spinal Brace: Lumbar corset;Applied in sitting position Restrictions Weight Bearing Restrictions: No      Mobility Bed Mobility Overal bed mobility: Needs Assistance Bed Mobility: Supine to Sit     Supine to sit: Min guard     General bed mobility comments: incr time and mod v/c   Transfers Overall transfer level: Needs assistance Equipment used: Rolling walker (2 wheeled) Transfers: Sit to/from Stand Sit to Stand: Supervision         General transfer comment: cues for hand placement and safety    Balance Overall balance assessment: Needs assistance Sitting-balance support: No upper extremity supported;Feet supported Sitting balance-Leahy Scale: Good     Standing balance support: Single extremity supported;During functional activity Standing balance-Leahy Scale: Fair                              ADL Overall ADL's : Needs assistance/impaired     Grooming: Wash/dry hands;Supervision/safety;Standing   Upper Body Bathing: Supervision/  safety;Sitting   Lower Body Bathing: Minimal assistance;Sit to/from stand           Toilet Transfer: Min guard;Ambulation           Functional mobility during ADLs: Min guard General ADL Comments: Pt voiding 800 cc of urine this session and verbalized feeling like need to void bowels but unable to complete. Pt and wife educated on back precautions with adls.Pt with recent back surg in last 2 years. Pt able to don brace mod I. Pt could benefit from further demo of adls at sink levle and shower transfer     Vision Vision Assessment?: No apparent visual deficits   Perception     Praxis      Pertinent Vitals/Pain Pain Assessment: 0-10 Pain Score: 6  Pain Location: abdoment feel like i am going to pop I am so full Pain Descriptors / Indicators: Discomfort Pain Intervention(s): Monitored during session;Repositioned     Hand Dominance Right   Extremity/Trunk Assessment Upper Extremity Assessment Upper Extremity Assessment: Overall WFL for tasks assessed   Lower Extremity Assessment Lower Extremity Assessment: Defer to PT evaluation   Cervical / Trunk Assessment Cervical / Trunk Assessment: Other exceptions (s/p surg)   Communication Communication Communication: No difficulties   Cognition Arousal/Alertness: Awake/alert Behavior During Therapy: WFL for tasks assessed/performed Overall Cognitive Status: Within Functional Limits for tasks assessed                     General Comments       Exercises Exercises: General Lower Extremity     Shoulder Instructions  Home Living Family/patient expects to be discharged to:: Private residence Living Arrangements: Spouse/significant other Available Help at Discharge: Family Type of Home: House Home Access: Stairs to enter Technical brewer of Steps: 2 Entrance Stairs-Rails: None Home Layout: One level     Bathroom Shower/Tub: Tub/shower unit Shower/tub characteristics: Door Biochemist, clinical:  Standard     Home Equipment: Bedside commode;Walker - 2 wheels   Additional Comments: back brace      Prior Functioning/Environment Level of Independence: Independent with assistive device(s)             OT Diagnosis: Generalized weakness;Acute pain   OT Problem List: Decreased strength;Decreased activity tolerance;Impaired balance (sitting and/or standing);Decreased safety awareness;Decreased knowledge of use of DME or AE;Decreased knowledge of precautions;Pain   OT Treatment/Interventions: Self-care/ADL training;Therapeutic exercise;DME and/or AE instruction;Therapeutic activities;Patient/family education;Balance training    OT Goals(Current goals can be found in the care plan section) Acute Rehab OT Goals Patient Stated Goal: Go home OT Goal Formulation: With patient Time For Goal Achievement: 04/30/15 Potential to Achieve Goals: Good  OT Frequency: Min 2X/week   Barriers to D/C:            Co-evaluation              End of Session Equipment Utilized During Treatment: Rolling walker;Back brace;Gait belt Nurse Communication: Mobility status;Precautions  Activity Tolerance: Patient tolerated treatment well Patient left: Other (comment) (with PT logan)   Time: 5597-4163 OT Time Calculation (min): 28 min Charges:  OT General Charges $OT Visit: 1 Procedure OT Evaluation $Initial OT Evaluation Tier I: 1 Procedure OT Treatments $Self Care/Home Management : 8-22 mins G-Codes:    Peri Maris 04-May-2015, 1:43 PM  Pager: [phone]

## 2015-04-16 NOTE — Discharge Summary (Signed)
Physician Discharge Summary  Patient ID: Jay Tran MRN: [id number] DOB/AGE: [birth year] 81 y.o.  Admit date: 04/15/2015 Discharge date: 04/16/2015  Admission Diagnoses: adjacent level stenosis   Discharge Diagnoses: same   Discharged Condition: good  Hospital Course: The patient was admitted on 04/15/2015 and taken to the operating room where the patient underwent PLIF L2-3. The patient tolerated the procedure well and was taken to the recovery room and then to the floor in stable condition. The hospital course was routine. There were no complications. The wound remained clean dry and intact. Pt had appropriate back soreness. No complaints of leg pain or new N/T/W. The patient remained afebrile with stable vital signs, and tolerated a regular diet. The patient continued to increase activities, and pain was well controlled with oral pain medications.   Consults: none  Significant Diagnostic Studies:  Results for orders placed or performed during the hospital encounter of 04/08/15  Surgical pcr screen  Result Value Ref Range   MRSA, PCR NEGATIVE NEGATIVE   Staphylococcus aureus NEGATIVE NEGATIVE  Basic metabolic panel  Result Value Ref Range   Sodium 138 135 - 145 mmol/L   Potassium 3.7 3.5 - 5.1 mmol/L   Chloride 99 96 - 112 mmol/L   CO2 28 19 - 32 mmol/L   Glucose, Bld 108 (H) 70 - 99 mg/dL   BUN 12 6 - 23 mg/dL   Creatinine, Ser 0.98 0.50 - 1.35 mg/dL   Calcium 9.4 8.4 - 10.5 mg/dL   GFR calc non Af Amer 77 (L) >90 mL/min   GFR calc Af Amer 89 (L) >90 mL/min   Anion gap 11 5 - 15  CBC WITH DIFFERENTIAL  Result Value Ref Range   WBC 12.4 (H) 4.0 - 10.5 K/uL   RBC 4.51 4.22 - 5.81 MIL/uL   Hemoglobin 14.8 13.0 - 17.0 g/dL   HCT 42.0 39.0 - 52.0 %   MCV 93.1 78.0 - 100.0 fL   MCH 32.8 26.0 - 34.0 pg   MCHC 35.2 30.0 - 36.0 g/dL   RDW 13.0 11.5 - 15.5 %   Platelets 307 150 - 400 K/uL   Neutrophils Relative % 78 (H) 43 - 77 %   Neutro Abs 9.6 (H) 1.7 - 7.7 K/uL   Lymphocytes Relative 14 12 - 46 %   Lymphs Abs 1.7 0.7 - 4.0 K/uL   Monocytes Relative 8 3 - 12 %   Monocytes Absolute 1.0 0.1 - 1.0 K/uL   Eosinophils Relative 0 0 - 5 %   Eosinophils Absolute 0.1 0.0 - 0.7 K/uL   Basophils Relative 0 0 - 1 %   Basophils Absolute 0.0 0.0 - 0.1 K/uL  Protime-INR  Result Value Ref Range   Prothrombin Time 13.4 11.6 - 15.2 seconds   INR 1.01 0.00 - 1.49  Type and screen  Result Value Ref Range   ABO/RH(D) A POS    Antibody Screen NEG    Sample Expiration 04/22/2015     Chest 2 View  04/08/2015   CLINICAL DATA:  Hypertension.  Preop for lumbar surgery.  EXAM: CHEST  2 VIEW  COMPARISON:  October 17, 2012.  FINDINGS: The heart size and mediastinal contours are within normal limits. Both lungs are clear. No pneumothorax or pleural effusion is noted. The visualized skeletal structures are unremarkable.  IMPRESSION: No active cardiopulmonary disease.   Electronically Signed   By: Marijo Conception, M.D.   On: 04/08/2015 15:14   Dg Lumbar Spine 2-3 Views  04/15/2015   CLINICAL DATA:  L2-3 PLIF extension of hardware 4 stenosis.  EXAM: DG C-ARM 61-120 MIN; LUMBAR SPINE - 2-3 VIEW  TECHNIQUE: Two views obtained via intraoperative C-arm radiography were submitted.  CONTRAST:  No contrast.  FLUOROSCOPY TIME:  Radiation Exposure Index (as provided by the fluoroscopic device):  If the device does not provide the exposure index:  Fluoroscopy Time (in minutes and seconds):  38 seconds  Number of Acquired Images:  2 images  COMPARISON:  03/28/2015.  FINDINGS: There are transpedicular screws and posterior rods which extend fro L2 through L5. Interbody spacer material is noted at L2-3 L3-4 and L4-5. The alignment is anatomic. No complications.  IMPRESSION: 1. Interval extension of laminectomy and posterior fixation hardware to include the L2-3 level.   Electronically Signed   By: Kerby Moors M.D.   On: 04/15/2015 16:39   Ct Lumbar Spine Wo Contrast  03/24/2015   CLINICAL DATA:   Chronic low back pain. History of lumbar laminectomy and fusion 05/01/2013.  EXAM: CT LUMBAR SPINE WITHOUT CONTRAST  TECHNIQUE: Multidetector CT imaging of the lumbar spine was performed without intravenous contrast administration. Multiplanar CT image reconstructions were also generated.  COMPARISON:  Plain films lumbar spine 12/16/2013 and 03/03/2015. MRI lumbar spine 04/08/2013.  FINDINGS: The patient is status post L3-5 fusion with pedicle screws and stabilization bars in place. Mild lucency is seen about the L5 pedicle screws, more notable on the left. The screws are well positioned. There is solid fusion across the disc interspace and posterior elements at L3-4. Bridging bone is also identified across the L4-5 disc interspace and right facet joint. vertebral body height and alignment are maintained. Imaged intra-abdominal contents are unremarkable.  T11-12: The central canal and foramina appear widely patent. No disc bulge or protrusion is identified. Mild facet degenerative change is seen.  T12-L1:  Negative.  L1-2: A very shallow calcified central protrusion is seen but the central canal and foramina appear open.  L2-3: There is ligamentum flavum thickening with a shallow disc bulge and superimposed left paracentral protrusion. Moderate central canal stenosis is present and there is narrowing in the lateral recesses, worse on the left, which could impact the either descending L3 root. The foramina appear open. Spondylosis appears somewhat progressive compared to the prior MRI.  L3-4: The patient is status post laminectomy and fusion. The central canal and foramina are widely patent.  L4-5: The patient is status post laminectomy and fusion. The central canal and foramina are widely patent.  L5-S1: There is a shallow disc bulge without central canal or foraminal narrowing. The right facet joint is ankylosed.  IMPRESSION: Status post L3-5 fusion. The central canal and foramina are widely patent at each level  and no complicating feature is identified. Although fusion appears more robust at L3-4, bridging bone is identified at both levels.  Progressive spondylosis at L2-3 where there is moderate central canal stenosis and narrowing in the lateral recesses, worse on the left, due to a disc bulge, small appearing left paracentral protrusion and ligamentum flavum thickening.   Electronically Signed   By: Inge Rise M.D.   On: 03/24/2015 10:36   Dg Epidurography  04/06/2015   CLINICAL DATA:  Lumbosacral spondylosis without myelopathy. Lumbar spinal stenosis. Adjacent segment disease following lumbar fusion. Request for L2-L3 interlaminar epidural steroid injection. Although the calcified disc protrusion on prior myelogram is LEFT eccentric, the patient is symptomatic more on the RIGHT side, therefore RIGHT-sided approach employed.  EXAM: LUMBAR INTERLAMINAR EPIDURAL INJECTION  FLUOROSCOPY TIME:  0 minutes 26 seconds  Dose: 0.126 Gycm2  PROCEDURE: Procedure: After a thorough discussion of risks and benefits of the procedure, written and verbal consent was obtained. Specific risks included puncture of the thecal sac and dura as well as nontherapeutic injection with general risks of bleeding, infection, injury to nerves, blood vessels, and adjacent structures. Time out form was completed. Verbal consent was obtained by Dr. Gerilyn Pilgrim. We discussed the moderate likelihood of moderate lasting relief/attainment of therapeutic goal. The overlying skin was cleansed with betadine soap and anesthetized with 1% lidocaine without epinephrine. An interlaminar approach was performed at L2-L3 on the RIGHT. 3.5 inch 20 gauge needle was advanced using loss-of-resistance technique.  DIAGNOSTIC/THERAPUETIC EPIDURAL INJECTION: Injection of Omnipaque 180 shows a good epidural pattern with spread above and below the level of needle placement, primarily on the side of needle placement. No vascular or subarachnoid opacification was seen. 120 mg  of Depo-Medrol mixed with 5 cc of 1% Lidocaine were instilled. The procedure was well-tolerated, and the patient was discharged thirty minutes following the injection in good condition.  IMPRESSION: Technically successful first lumbar interlaminar epidural injection at RIGHT L2-L3.   Electronically Signed   By: Dereck Ligas M.D.   On: 04/06/2015 07:57   Dg C-arm 1-60 Min  04/15/2015   CLINICAL DATA:  L2-3 PLIF extension of hardware 4 stenosis.  EXAM: DG C-ARM 61-120 MIN; LUMBAR SPINE - 2-3 VIEW  TECHNIQUE: Two views obtained via intraoperative C-arm radiography were submitted.  CONTRAST:  No contrast.  FLUOROSCOPY TIME:  Radiation Exposure Index (as provided by the fluoroscopic device):  If the device does not provide the exposure index:  Fluoroscopy Time (in minutes and seconds):  38 seconds  Number of Acquired Images:  2 images  COMPARISON:  03/28/2015.  FINDINGS: There are transpedicular screws and posterior rods which extend fro L2 through L5. Interbody spacer material is noted at L2-3 L3-4 and L4-5. The alignment is anatomic. No complications.  IMPRESSION: 1. Interval extension of laminectomy and posterior fixation hardware to include the L2-3 level.   Electronically Signed   By: Kerby Moors M.D.   On: 04/15/2015 16:39    Antibiotics:  Anti-infectives    Start     Dose/Rate Route Frequency Ordered Stop   04/15/15 2130  ceFAZolin (ANCEF) IVPB 1 g/50 mL premix     1 g 100 mL/hr over 30 Minutes Intravenous Every 8 hours 04/15/15 1826 04/16/15 0519   04/15/15 1519  bacitracin 50,000 Units in sodium chloride irrigation 0.9 % 500 mL irrigation  Status:  Discontinued       As needed 04/15/15 1520 04/15/15 1651   04/15/15 1300  ceFAZolin (ANCEF) IVPB 2 g/50 mL premix     2 g 100 mL/hr over 30 Minutes Intravenous To Surgery 04/14/15 1346 04/15/15 1333      Discharge Exam: Blood pressure 126/44, pulse 66, temperature 98.2 F (36.8 C), temperature source Oral, resp. rate 18, height 5\' 9"  (1.753  m), weight 196 lb (88.905 kg), SpO2 95 %. Neurologic: Grossly normal incision CDI  Discharge Medications:     Medication List    STOP taking these medications        methocarbamol 500 MG tablet  Commonly known as:  ROBAXIN  Replaced by:  tiZANidine 4 MG tablet      TAKE these medications        finasteride 5 MG tablet  Commonly known as:  PROSCAR  Take 5 mg by mouth every evening.  Fish Oil 300 MG Caps  Take 600 mg by mouth daily.     hydrochlorothiazide 50 MG tablet  Commonly known as:  HYDRODIURIL  Take 50 mg by mouth daily.     lisinopril 20 MG tablet  Commonly known as:  PRINIVIL,ZESTRIL  Take 10 mg by mouth every morning.     oxyCODONE-acetaminophen 5-325 MG per tablet  Commonly known as:  PERCOCET/ROXICET  Take 1-2 tablets by mouth every 6 (six) hours as needed for moderate pain.     phenylephrine 10 MG Tabs tablet  Commonly known as:  SUDAFED PE  Take 10 mg by mouth every 4 (four) hours as needed.     ranitidine 150 MG tablet  Commonly known as:  ZANTAC  Take 150 mg by mouth as needed for heartburn.     tamsulosin 0.4 MG Caps capsule  Commonly known as:  FLOMAX  Take 0.4 mg by mouth every evening.     tiZANidine 4 MG tablet  Commonly known as:  ZANAFLEX  Take 1 tablet (4 mg total) by mouth every 8 (eight) hours as needed.        Disposition: home   Final Dx: PLIF L2-3      Discharge Instructions     Remove dressing in 72 hours    Complete by:  As directed      Call MD for:  difficulty breathing, headache or visual disturbances    Complete by:  As directed      Call MD for:  persistant nausea and vomiting    Complete by:  As directed      Call MD for:  redness, tenderness, or signs of infection (pain, swelling, redness, odor or green/yellow discharge around incision site)    Complete by:  As directed      Call MD for:  severe uncontrolled pain    Complete by:  As directed      Call MD for:  temperature >100.4    Complete by:  As  directed      Diet - low sodium heart healthy    Complete by:  As directed      Discharge instructions    Complete by:  As directed   No strenuous activity, no heavy lifting, no bending or twisting     Increase activity slowly    Complete by:  As directed               Signed: JONES,DAVID S 04/16/2015, 1:54 PM

## 2016-12-27 ENCOUNTER — Ambulatory Visit (INDEPENDENT_AMBULATORY_CARE_PROVIDER_SITE_OTHER): Payer: Medicare Other

## 2016-12-27 ENCOUNTER — Ambulatory Visit (INDEPENDENT_AMBULATORY_CARE_PROVIDER_SITE_OTHER): Payer: Self-pay

## 2016-12-27 ENCOUNTER — Ambulatory Visit (INDEPENDENT_AMBULATORY_CARE_PROVIDER_SITE_OTHER): Payer: Medicare Other | Admitting: Orthopaedic Surgery

## 2016-12-27 DIAGNOSIS — G8929 Other chronic pain: Secondary | ICD-10-CM

## 2016-12-27 DIAGNOSIS — M25512 Pain in left shoulder: Secondary | ICD-10-CM

## 2016-12-27 DIAGNOSIS — M25511 Pain in right shoulder: Secondary | ICD-10-CM

## 2016-12-27 MED ORDER — LIDOCAINE HCL 1 % IJ SOLN
1.0000 mL | INTRAMUSCULAR | Status: AC | PRN
  Administered 2016-12-27: 1 mL

## 2016-12-27 MED ORDER — METHYLPREDNISOLONE ACETATE 40 MG/ML IJ SUSP
40.0000 mg | INTRAMUSCULAR | Status: AC | PRN
  Administered 2016-12-27: 40 mg via INTRA_ARTICULAR

## 2016-12-27 NOTE — Progress Notes (Signed)
Office Visit Note   Patient: Jay Tran           Date of Birth: 1934/05/10           MRN: [id number] Visit Date: 12/27/2016              Requested by: Leonard Downing, MD [address] [place], [place] [address] PCP: Leonard Downing, MD   Assessment & Plan: Visit Diagnoses:  1. Chronic left shoulder pain   2. Chronic right shoulder pain     Plan: He tolerated the steroid injections well and both shoulders. He said that all he really wants to try right now because his biggest problems is getting sleep at night. He doesn't need any other medications he states. He will call if this does not help or need something else.  Follow-Up Instructions: Return if symptoms worsen or fail to improve.   Orders:  Orders Placed This Encounter  Procedures  . Large Joint Injection/Arthrocentesis  . Large Joint Injection/Arthrocentesis  . XR Shoulder Left  . XR Shoulder Right   No orders of the defined types were placed in this encounter.     Procedures: Large Joint Inj Date/Time: 12/27/2016 9:24 AM Performed by: Mcarthur Rossetti Authorized by: Jean Rosenthal Y   Location:  Shoulder Site:  L subacromial bursa Ultrasound Guidance: No   Fluoroscopic Guidance: No   Arthrogram: No   Medications:  1 mL lidocaine 1 %; 40 mg methylPREDNISolone acetate 40 MG/ML Large Joint Inj Date/Time: 12/27/2016 9:26 AM Performed by: Mcarthur Rossetti Authorized by: Mcarthur Rossetti   Location:  Shoulder Site:  R subacromial bursa Ultrasound Guidance: No   Fluoroscopic Guidance: No   Arthrogram: No   Medications:  1 mL lidocaine 1 %; 40 mg methylPREDNISolone acetate 40 MG/ML     Clinical Data: No additional findings.   Subjective: Chief Complaint  Patient presents with  . Left Shoulder - Pain    Bilateral shoulder pain for quite sometime. History of RCT on the right shoulder and history of left shoulder injury on the left shoulder.  . 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 He has remote history of an acromioclavicular dislocation of the left shoulder as well as a history of a right shoulder rotator cuff repair that was complicated by infection. Both shoulders waking him up at night and hurt with overhead activities. He denies any weakness. He points to the subdeltoid area of both shoulders a source of pain. Review of Systems He currently denies any headache, chest pain, shortness of breath, fever, chills, nausea, vomiting.  Objective: Vital Signs: There were no vitals taken for this visit.  Physical Exam He is alert and oriented 3 and in no acute distress Ortho Exam Both shoulder show signs of impingement. They both have excellent range of motion. 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 The right shoulder has scarring from his previous surgery. Neither shoulder shows significant atrophy in the muscles. Both shoulders have good abduction and external rotation and good strength. Specialty Comments:  No specialty comments available.  Imaging: Xr Shoulder Left  Result Date: 12/27/2016 3 views of his left shoulder including AP, outlet, and axillary show well located shoulder. He has a chronic acromioclavicular dislocation but otherwise well located shoulder with no acute findings. There is good space in the subacromial outlet.  Xr Shoulder Right  Result Date: 12/27/2016 3 views of his right shoulder AP, outlet, axillary reviewed. You can see his remote surgical changes with anchors in the shoulder from his previous rotator cuff repair. There is a buildup of heterotopic bone around the greater tuberosity the shoulder. The shoulders well located however there is significant arthritic changes throughout the glenohumeral joint and decrease in the subacromial outlet.    PMFS History: Patient Active Problem List     Diagnosis Date Noted  . S/P lumbar spinal fusion 04/15/2015  . HNP (herniated nucleus pulposus), lumbar 10/26/2012    Class: Diagnosis of   Past Medical History:  Diagnosis Date  . Arthritis   . BPH (benign prostatic hypertrophy)   . 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    Hx: of  . GERD (gastroesophageal reflux disease)   . Hearing aid worn    Hx: of right ear  . Hyperlipemia   . Hypertension   . PONV (postoperative nausea and vomiting)   . Seasonal allergies    Hx: of    Family History  Problem Relation Age of Onset  . Stroke Mother   . Cancer - Prostate Father     Past Surgical History:  Procedure Laterality Date  . COLONOSCOPY     Hx: of  . HERNIA REPAIR    . LUMBAR LAMINECTOMY  10/26/2012   Procedure: MICRODISCECTOMY LUMBAR LAMINECTOMY;  Surgeon: Marybelle Killings, MD;  Location: [facility];  Service: Orthopedics;  Laterality: Right;  Right L4-5 Microdiscectomy  . MAXIMUM ACCESS (MAS)POSTERIOR LUMBAR INTERBODY FUSION (PLIF) 1 LEVEL N/A 04/15/2015   Procedure: Maximum Access Surgery Posterior Lumbar Interbody Fusion Lumbar two-Lumbar three extension of hardware;  Surgeon: Eustace Moore, MD;  Location: [facility];  Service: Neurosurgery;  Laterality: N/A;  . ROTATOR CUFF REPAIR     right shoulder - 3 times  . TONSILLECTOMY     Social History   Occupational History  . Not on file.   Social History Main Topics  . Smoking status: Former Smoker    Quit date: 11/12/1963  . Smokeless tobacco: Never Used  . Alcohol use No  . Drug use: No  . Sexual activity: Yes

## 2017-01-24 ENCOUNTER — Ambulatory Visit (INDEPENDENT_AMBULATORY_CARE_PROVIDER_SITE_OTHER): Payer: Self-pay

## 2017-01-24 ENCOUNTER — Other Ambulatory Visit (INDEPENDENT_AMBULATORY_CARE_PROVIDER_SITE_OTHER): Payer: Self-pay

## 2017-01-24 ENCOUNTER — Encounter (INDEPENDENT_AMBULATORY_CARE_PROVIDER_SITE_OTHER): Payer: Self-pay | Admitting: Orthopaedic Surgery

## 2017-01-24 ENCOUNTER — Ambulatory Visit (INDEPENDENT_AMBULATORY_CARE_PROVIDER_SITE_OTHER): Payer: Medicare Other | Admitting: Orthopaedic Surgery

## 2017-01-24 DIAGNOSIS — M545 Low back pain, unspecified: Secondary | ICD-10-CM

## 2017-01-24 DIAGNOSIS — M5441 Lumbago with sciatica, right side: Principal | ICD-10-CM

## 2017-01-24 DIAGNOSIS — G8929 Other chronic pain: Secondary | ICD-10-CM

## 2017-01-24 DIAGNOSIS — M25551 Pain in right hip: Secondary | ICD-10-CM | POA: Diagnosis not present

## 2017-01-24 DIAGNOSIS — M25552 Pain in left hip: Secondary | ICD-10-CM | POA: Diagnosis not present

## 2017-01-24 DIAGNOSIS — M25512 Pain in left shoulder: Secondary | ICD-10-CM | POA: Diagnosis not present

## 2017-01-24 DIAGNOSIS — M5442 Lumbago with sciatica, left side: Principal | ICD-10-CM

## 2017-01-24 MED ORDER — LIDOCAINE HCL 1 % IJ SOLN
3.0000 mL | INTRAMUSCULAR | Status: AC | PRN
  Administered 2017-01-24: 3 mL

## 2017-01-24 MED ORDER — METHYLPREDNISOLONE ACETATE 40 MG/ML IJ SUSP
40.0000 mg | INTRAMUSCULAR | Status: AC | PRN
  Administered 2017-01-24: 40 mg via INTRA_ARTICULAR

## 2017-01-24 NOTE — Progress Notes (Signed)
Office Visit Note   Patient: Jay Tran           Date of Birth: 07-Apr-1934           MRN: [id number] Visit Date: 01/24/2017              Requested by: Leonard Downing, MD [address] [place], [place] [address] PCP: Leonard Downing, MD   Assessment & Plan: Visit Diagnoses:  1. Acute bilateral low back pain without sciatica   2. Bilateral hip pain   3. Chronic left shoulder pain     Plan: He tolerated the steroid injection in his subacromial space well the left shoulder. As far as the shoulder goes my next step would be likely an arthroscopic intervention if it continues to bother him. I would like to send him to Dr. Ernestina Patches for bilateral SI joint injections based on where he is hurting his pelvis. He is seen Dr. Sherley Bounds in follow-up for his lumbar spine and I was postoperative follow-up as well and he said Dr. Ronnald Ramp is a loss of what else he can provide for him. It seems like his pain is more facet joint early likely SI joint mediated and certainly injections necessary could help and he was interested in seeing if Dr. Ernestina Patches could provide some relief with injections in that area. I'll see him back myself in 4 weeks.  Follow-Up Instructions: Return in about 4 weeks (around 02/21/2017).   Orders:  Orders Placed This Encounter  Procedures  . Large Joint Injection/Arthrocentesis   No orders of the defined types were placed in this encounter.     Procedures: Large Joint Inj Date/Time: 01/24/2017 9:52 AM Performed by: Mcarthur Rossetti Authorized by: Mcarthur Rossetti   Location:  Shoulder Site:  L subacromial bursa Ultrasound Guidance: No   Fluoroscopic Guidance: No   Arthrogram: No   Medications:  3 mL lidocaine 1 %; 40 mg methylPREDNISolone acetate 40 MG/ML     Clinical Data: No additional findings.   Subjective: Chief Complaint  Patient presents with  . Right Shoulder - Follow-up  . Left Shoulder - Follow-up  . Bilateral Hip Pain      HPI The patient is well-known to me. 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 He has had one injection that helped he would like to have at least one more today. He is also a patient of Dr. Sherley Bounds a neurosurgeon in town who did lumbar spine surgery on him and is now released him from his fusion. This was done in 2016. He still has complaints of pain in his pelvis and he points the SI joint areas of his pelvis. He said Dr. Ronnald Ramp is a loss will see can do for him. Review of Systems  He denies any chest pain, headache, shortness of breath, fever, chills, nausea, vomiting. He denies any change in bowel or bladder function  Objective: Vital Signs: There were no vitals taken for this visit.  Physical Exam He is alert and oriented 3   Ortho Exam His left shoulder has an obvious deficit of before meals joint with an old significant before meals joint separation. There is grinding of the glenohumeral joint and subacromial space. There is weakness of the rotator cuff as well. His pain is back is really over the SI joints mainly the pelvis with  flexion-extension when he first gets up but there is no radicular complaints at all and he says his back doesn't hurt at all. Specialty Comments:  No specialty comments available.  Imaging: No results found.   PMFS History: Patient Active Problem List   Diagnosis Date Noted  . S/P lumbar spinal fusion 04/15/2015  . HNP (herniated nucleus pulposus), lumbar 10/26/2012    Class: Diagnosis of   Past Medical History:  Diagnosis Date  . Arthritis   . BPH (benign prostatic hypertrophy)   . 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 GERD (gastroesophageal reflux disease)   . Hearing aid worn    Hx: of right ear  . Hyperlipemia   . Hypertension   . PONV (postoperative nausea and vomiting)   . Seasonal  allergies    Hx: of    Family History  Problem Relation Age of Onset  . Stroke Mother   . Cancer - Prostate Father     Past Surgical History:  Procedure Laterality Date  . COLONOSCOPY     Hx: of  . HERNIA REPAIR    . LUMBAR LAMINECTOMY  10/26/2012   Procedure: MICRODISCECTOMY LUMBAR LAMINECTOMY;  Surgeon: Marybelle Killings, MD;  Location: [facility];  Service: Orthopedics;  Laterality: Right;  Right L4-5 Microdiscectomy  . MAXIMUM ACCESS (MAS)POSTERIOR LUMBAR INTERBODY FUSION (PLIF) 1 LEVEL N/A 04/15/2015   Procedure: Maximum Access Surgery Posterior Lumbar Interbody Fusion Lumbar two-Lumbar three extension of hardware;  Surgeon: Eustace Moore, MD;  Location: [facility];  Service: Neurosurgery;  Laterality: N/A;  . ROTATOR CUFF REPAIR     right shoulder - 3 times  . TONSILLECTOMY     Social History   Occupational History  . Not on file.   Social History Main Topics  . Smoking status: Former Smoker    Quit date: 11/12/1963  . Smokeless tobacco: Never Used  . Alcohol use No  . Drug use: No  . Sexual activity: Yes

## 2017-01-30 ENCOUNTER — Ambulatory Visit (INDEPENDENT_AMBULATORY_CARE_PROVIDER_SITE_OTHER): Payer: Self-pay

## 2017-01-30 ENCOUNTER — Ambulatory Visit (INDEPENDENT_AMBULATORY_CARE_PROVIDER_SITE_OTHER): Payer: Medicare Other | Admitting: Physical Medicine and Rehabilitation

## 2017-01-30 ENCOUNTER — Encounter (INDEPENDENT_AMBULATORY_CARE_PROVIDER_SITE_OTHER): Payer: Self-pay | Admitting: Physical Medicine and Rehabilitation

## 2017-01-30 VITALS — BP 151/65 | HR 53

## 2017-01-30 DIAGNOSIS — M461 Sacroiliitis, not elsewhere classified: Secondary | ICD-10-CM | POA: Diagnosis not present

## 2017-01-30 NOTE — Patient Instructions (Signed)

## 2017-01-30 NOTE — Progress Notes (Signed)
Jay Tran - 83 y.o. male MRN [id number]  Date of birth: 05/16/1934  Office Visit Note: Visit Date: 01/30/2017 PCP: Jay Downing, MD Referred by: Jay Tran, *  Subjective: Chief Complaint  Patient presents with  . Lower Back - Pain   HPI: Jay Tran is an 83 year old gentleman that I have seen in the remote past through Jay Tran. He complains of chronic worsening severe lower back pain "right below my belt line." Worse on right  and worse with bending. Denies pain down legs. He has had lumbar fusion surgery performed by Jay Tran. This was not very successful in helping his pain. In the past injections were not very beneficial in helping his pain through Jay Tran. He is now seeing Jay Tran who request a diagnostic and hopefully therapeutic sacroiliac joint injections. The patient does have pain below the belt line and does indicate pain at or about the sacroiliac joints bilaterally. He has had facet ablation in the past without good results. All of the injections performed were significantly more painful for him that most patients.    ROS Otherwise per HPI.  Assessment & Plan: Visit Diagnoses:  1. Sacroiliitis ([place])     Plan: Findings:  Bilateral sacroiliac joint injections diagnostically and hopefully therapeutically.    Meds & Orders: No orders of the defined types were placed in this encounter.   Orders Placed This Encounter  Procedures  . Large Joint Injection/Arthrocentesis  . Large Joint Injection/Arthrocentesis  . XR C-ARM NO REPORT    Follow-up: Return if symptoms worsen or fail to improve, 2weeks, for Jay Tran.   Procedures: Large Joint Inj Date/Time: 01/30/2017 10:59 AM Performed by: Jay Tran Authorized by: Jay Tran   Consent Given by:  Patient Site marked: the procedure site was marked   Timeout: prior to procedure the correct patient, procedure, and site was verified   Indications:  Pain and diagnostic  evaluation Location:  Sacroiliac Site:  R sacroiliac joint Prep: patient was prepped and draped in usual sterile fashion   Needle Size:  22 G Needle Length:  3.5 inches Approach:  Posterior Ultrasound Guidance: No   Fluoroscopic Guidance: Yes   Arthrogram: No   Medications:  80 mg methylPREDNISolone acetate 80 MG/ML; 3 mL bupivacaine 0.5 % Aspiration Attempted: No   Patient tolerance:  Patient tolerated the procedure well with no immediate complications  There was excellent flow of contrast producing a partial arthrogram of the sacroiliac joint.  Large Joint Inj Date/Time: 01/30/2017 11:00 AM Performed by: Jay Tran Authorized by: Jay Tran   Consent Given by:  Patient Site marked: the procedure site was marked   Timeout: prior to procedure the correct patient, procedure, and site was verified   Indications:  Pain and diagnostic evaluation Location:  Sacroiliac Site:  L sacroiliac joint Prep: patient was prepped and draped in usual sterile fashion   Needle Size:  22 G Needle Length:  3.5 inches Approach:  Posterior Ultrasound Guidance: No   Fluoroscopic Guidance: Yes   Arthrogram: No   Medications:  80 mg methylPREDNISolone acetate 80 MG/ML; 3 mL bupivacaine 0.5 % Aspiration Attempted: No   Patient tolerance:  Patient tolerated the procedure well with no immediate complications  There was excellent flow of contrast producing a partial arthrogram of the sacroiliac joint.     No notes on file   Clinical History: No specialty comments available.  He reports that he quit smoking about 53 years ago. He has never used smokeless tobacco.  No results for input(s): HGBA1C, LABURIC in the last 8760 hours.  Objective:  VS:  HT:    WT:   BMI:     BP:(!) 151/65  HR:(!) 53bpm  TEMP: ( )  RESP:96 % Physical Exam  Musculoskeletal:  Patient ambulates with a forward flexed spine. He has pain with extension of the lumbar spine. He is to positive Jay Tran finger test  bilaterally. He has good distal strength.    Ortho Exam Imaging: Xr C-arm No Report  Result Date: 01/30/2017 Please see Notes or Procedures tab for imaging impression.   Past Medical/Family/Surgical/Social History: Medications & Allergies reviewed per EMR Patient Active Problem List   Diagnosis Date Noted  . S/P lumbar spinal fusion 04/15/2015  . HNP (herniated nucleus pulposus), lumbar 10/26/2012    Class: Diagnosis of   Past Medical History:  Diagnosis Date  . Arthritis   . BPH (benign prostatic hypertrophy)   . 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 GERD (gastroesophageal reflux disease)   . Hearing aid worn    Hx: of right ear  . Hyperlipemia   . Hypertension   . PONV (postoperative nausea and vomiting)   . Seasonal allergies    Hx: of   Family History  Problem Relation Age of Onset  . Stroke Mother   . Cancer - Prostate Father    Past Surgical History:  Procedure Laterality Date  . COLONOSCOPY     Hx: of  . HERNIA REPAIR    . LUMBAR LAMINECTOMY  10/26/2012   Procedure: MICRODISCECTOMY LUMBAR LAMINECTOMY;  Surgeon: Marybelle Killings, MD;  Location: [place];  Service: Orthopedics;  Laterality: Right;  Right L4-5 Microdiscectomy  . MAXIMUM ACCESS (MAS)POSTERIOR LUMBAR INTERBODY FUSION (PLIF) 1 LEVEL N/A 04/15/2015   Procedure: Maximum Access Surgery Posterior Lumbar Interbody Fusion Lumbar two-Lumbar three extension of hardware;  Surgeon: Eustace Moore, MD;  Location: [place] [facility];  Service: Neurosurgery;  Laterality: N/A;  . ROTATOR CUFF REPAIR     right shoulder - 3 times  . TONSILLECTOMY     Social History   Occupational History  . Not on file.   Social History Main Topics  . Smoking status: Former Smoker    Quit date: 11/12/1963  . Smokeless tobacco: Never Used  . Alcohol use No  . Drug use: No  . Sexual activity: Yes

## 2017-01-31 MED ORDER — METHYLPREDNISOLONE ACETATE 80 MG/ML IJ SUSP
80.0000 mg | INTRAMUSCULAR | Status: AC | PRN
  Administered 2017-01-30: 80 mg via INTRA_ARTICULAR

## 2017-01-31 MED ORDER — BUPIVACAINE HCL 0.5 % IJ SOLN
3.0000 mL | INTRAMUSCULAR | Status: AC | PRN
  Administered 2017-01-30: 3 mL via INTRA_ARTICULAR

## 2017-01-31 MED ORDER — BUPIVACAINE HCL 0.5 % IJ SOLN
3.0000 mL | INTRAMUSCULAR | Status: AC | PRN
Start: 2017-01-30 — End: 2017-01-30
  Administered 2017-01-30: 3 mL via INTRA_ARTICULAR

## 2017-02-06 ENCOUNTER — Other Ambulatory Visit: Payer: Self-pay | Admitting: Urology

## 2017-02-07 ENCOUNTER — Encounter (HOSPITAL_COMMUNITY): Payer: Self-pay | Admitting: *Deleted

## 2017-02-17 ENCOUNTER — Encounter (HOSPITAL_COMMUNITY): Payer: Self-pay

## 2017-02-17 ENCOUNTER — Encounter (HOSPITAL_COMMUNITY)
Admission: RE | Admit: 2017-02-17 | Discharge: 2017-02-17 | Disposition: A | Discharge status: Home / Self Care | Payer: Medicare Other | Source: Ambulatory Visit | Attending: Urology | Admitting: Urology

## 2017-02-17 DIAGNOSIS — I10 Essential (primary) hypertension: Secondary | ICD-10-CM | POA: Diagnosis not present

## 2017-02-17 DIAGNOSIS — Z01812 Encounter for preprocedural laboratory examination: Secondary | ICD-10-CM | POA: Insufficient documentation

## 2017-02-17 DIAGNOSIS — Z01810 Encounter for preprocedural cardiovascular examination: Secondary | ICD-10-CM | POA: Diagnosis not present

## 2017-02-17 HISTORY — DX: Other intervertebral disc displacement, lumbar region: M51.26

## 2017-02-17 LAB — BASIC METABOLIC PANEL
Anion gap: 8 (ref 5–15)
BUN: 15 mg/dL (ref 6–20)
CO2: 27 mmol/L (ref 22–32)
Calcium: 9.3 mg/dL (ref 8.9–10.3)
Chloride: 101 mmol/L (ref 101–111)
Creatinine, Ser: 1 mg/dL (ref 0.61–1.24)
GFR calc Af Amer: >60 mL/min (ref >60)
GFR calc non Af Amer: >60 mL/min (ref >60)
Glucose, Bld: 103 mg/dL — ABNORMAL HIGH (ref 65–99)
Potassium: 3.6 mmol/L (ref 3.5–5.1)
Sodium: 136 mmol/L (ref 135–145)

## 2017-02-17 LAB — CBC
HCT: 40 % (ref 39.0–52.0)
Hemoglobin: 13.9 g/dL (ref 13.0–17.0)
MCH: 32.6 pg (ref 26.0–34.0)
MCHC: 34.8 g/dL (ref 30.0–36.0)
MCV: 93.7 fL (ref 78.0–100.0)
Platelets: 226 10*3/uL (ref 150–400)
RBC: 4.27 MIL/uL (ref 4.22–5.81)
RDW: 13.4 % (ref 11.5–15.5)
WBC: 7 10*3/uL (ref 4.0–10.5)

## 2017-02-17 NOTE — Patient Instructions (Signed)
Jay Tran  02/17/2017   Your procedure is scheduled on: 02/24/17  Report to [facility] Main  Entrance take [facility]  elevators to 3rd floor to  [place] at 2:45 PM.  Call this number if you have problems the morning of surgery [phone]   Remember: ONLY 1 PERSON MAY GO WITH YOU TO SHORT STAY TO GET  READY MORNING OF [place].  Do not eat food or drink liquids :After Midnight. You may have clear liquids until 10:45 AM morning of surgery.  Then nothing to eat or drink.     Take these medicines the morning of surgery with A SIP OF WATER: Finasteride, Ranitidine if needed.                               You may not have any metal on your body including hair pins and              piercings  Do not wear jewelry, make-up, lotions, powders or perfumes, deodorant             Do not wear nail polish.  Do not shave  48 hours prior to surgery.              Men may shave face and neck.   Do not bring valuables to the hospital. [place].  Contacts, dentures or bridgework may not be worn into surgery.  Leave suitcase in the car. After surgery it may be brought to your room.               Please read over the following fact sheets you were given: _____________________________________________________________________             [facility] - Preparing for Surgery Before surgery, you can play an important role.  Because skin is not sterile, your skin needs to be as free of germs as possible.  You can reduce the number of germs on your skin by washing with CHG (chlorahexidine gluconate) soap before surgery.  CHG is an antiseptic cleaner which kills germs and bonds with the skin to continue killing germs even after washing. Please DO NOT use if you have an allergy to CHG or antibacterial soaps.  If your skin becomes reddened/irritated stop using the CHG and inform your nurse when you arrive at Short Stay. Do not  shave (including legs and underarms) for at least 48 hours prior to the first CHG shower.  You may shave your face/neck. Please follow these instructions carefully:  1.  Shower with CHG Soap the night before surgery and the  morning of Surgery.  2.  If you choose to wash your hair, wash your hair first as usual with your  normal  shampoo.  3.  After you shampoo, rinse your hair and body thoroughly to remove the  shampoo.                           4.  Use CHG as you would any other liquid soap.  You can apply chg directly  to the skin and wash  Gently with a scrungie or clean washcloth.  5.  Apply the CHG Soap to your body ONLY FROM THE NECK DOWN.   Do not use on face/ open                           Wound or open sores. Avoid contact with eyes, ears mouth and genitals (private parts).                       Wash face,  Genitals (private parts) with your normal soap.             6.  Wash thoroughly, paying special attention to the area where your surgery  will be performed.  7.  Thoroughly rinse your body with warm water from the neck down.  8.  DO NOT shower/wash with your normal soap after using and rinsing off  the CHG Soap.                9.  Pat yourself dry with a clean towel.            10.  Wear clean pajamas.            11.  Place clean sheets on your bed the night of your first shower and do not  sleep with pets. Day of Surgery : Do not apply any lotions/deodorants the morning of surgery.  Please wear clean clothes to the hospital/surgery center.  FAILURE TO FOLLOW THESE INSTRUCTIONS MAY RESULT IN THE CANCELLATION OF YOUR SURGERY PATIENT SIGNATURE_________________________________  NURSE SIGNATURE__________________________________  ________________________________________________________________________     CLEAR LIQUID DIET   Foods Allowed                                                                     Foods Excluded  Coffee and tea, regular and decaf                              liquids that you cannot  Plain Jell-O in any flavor                                             see through such as: Fruit ices (not with fruit pulp)                                     milk, soups, orange juice  Iced Popsicles                                    All solid food Carbonated beverages, regular and diet                                    Cranberry, grape and apple juices Sports drinks like Gatorade Lightly seasoned clear broth or consume(fat free) Sugar, honey syrup  Sample Menu Breakfast                                Lunch                                     Supper Cranberry juice                    Beef broth                            Chicken broth Jell-O                                     Grape juice                           Apple juice Coffee or tea                        Jell-O                                      Popsicle                                                Coffee or tea                        Coffee or tea  _____________________________________________________________________

## 2017-02-20 NOTE — Progress Notes (Signed)
Final EKG done 3/2 /18 in epic

## 2017-02-22 ENCOUNTER — Ambulatory Visit (INDEPENDENT_AMBULATORY_CARE_PROVIDER_SITE_OTHER): Payer: Medicare Other | Admitting: Orthopaedic Surgery

## 2017-02-22 ENCOUNTER — Encounter (INDEPENDENT_AMBULATORY_CARE_PROVIDER_SITE_OTHER): Payer: Self-pay | Admitting: Orthopaedic Surgery

## 2017-02-22 DIAGNOSIS — M25512 Pain in left shoulder: Secondary | ICD-10-CM

## 2017-02-22 DIAGNOSIS — G8929 Other chronic pain: Secondary | ICD-10-CM

## 2017-02-22 DIAGNOSIS — M25511 Pain in right shoulder: Secondary | ICD-10-CM | POA: Diagnosis not present

## 2017-02-22 NOTE — Progress Notes (Signed)
The patient is coming for follow-up after having a left shoulder subacromial injection as well as bilateral SI joint injections by Dr. Ernestina Patches. He says none of this is really help much. He is 83 years old. He says is an aggravating pain but nothing that he can't deal with. He is not looking for any other intervention. He's had a history of a back fusion before the lumbar spine. He says right now his pain is again more aggravating and minimal and is not detrimental effecting his activity is daily living, his quality of life, her mobility.  On examination of both shoulders but shoulders have good range of motion but obvious signs of impingement with positive Neer and Hawkins signs. He has pain in the low back and pelvis on both sides. There is no radicular components of his upper or lower extremity pain or exam.  At this point follow-up as needed. We can always provide injections again in about her some enough.

## 2017-02-23 MED ORDER — GENTAMICIN SULFATE 40 MG/ML IJ SOLN
5.0000 mg/kg | INTRAVENOUS | Status: AC
  Administered 2017-02-24: 400 mg via INTRAVENOUS
  Filled 2017-02-23: qty 10

## 2017-02-24 ENCOUNTER — Encounter (HOSPITAL_COMMUNITY)
Admission: RE | Disposition: A | Discharge status: Home / Self Care | Payer: Self-pay | Source: Ambulatory Visit | Attending: Urology

## 2017-02-24 ENCOUNTER — Ambulatory Visit (HOSPITAL_COMMUNITY): Payer: Medicare Other | Admitting: Anesthesiology

## 2017-02-24 ENCOUNTER — Encounter (HOSPITAL_COMMUNITY): Payer: Self-pay

## 2017-02-24 ENCOUNTER — Ambulatory Visit (HOSPITAL_COMMUNITY)
Admission: RE | Admit: 2017-02-24 | Discharge: 2017-02-24 | Disposition: A | Discharge status: Home / Self Care | Payer: Medicare Other | Source: Ambulatory Visit | Attending: Urology | Admitting: Urology

## 2017-02-24 DIAGNOSIS — N3081 Other cystitis with hematuria: Secondary | ICD-10-CM | POA: Diagnosis not present

## 2017-02-24 DIAGNOSIS — D304 Benign neoplasm of urethra: Secondary | ICD-10-CM | POA: Insufficient documentation

## 2017-02-24 DIAGNOSIS — Z87891 Personal history of nicotine dependence: Secondary | ICD-10-CM | POA: Diagnosis not present

## 2017-02-24 DIAGNOSIS — Z823 Family history of stroke: Secondary | ICD-10-CM | POA: Diagnosis not present

## 2017-02-24 DIAGNOSIS — E785 Hyperlipidemia, unspecified: Secondary | ICD-10-CM | POA: Diagnosis not present

## 2017-02-24 DIAGNOSIS — Z79899 Other long term (current) drug therapy: Secondary | ICD-10-CM | POA: Diagnosis not present

## 2017-02-24 DIAGNOSIS — I10 Essential (primary) hypertension: Secondary | ICD-10-CM | POA: Insufficient documentation

## 2017-02-24 DIAGNOSIS — Z86718 Personal history of other venous thrombosis and embolism: Secondary | ICD-10-CM | POA: Diagnosis not present

## 2017-02-24 DIAGNOSIS — R3914 Feeling of incomplete bladder emptying: Secondary | ICD-10-CM | POA: Insufficient documentation

## 2017-02-24 DIAGNOSIS — M1990 Unspecified osteoarthritis, unspecified site: Secondary | ICD-10-CM | POA: Diagnosis not present

## 2017-02-24 DIAGNOSIS — N401 Enlarged prostate with lower urinary tract symptoms: Secondary | ICD-10-CM | POA: Insufficient documentation

## 2017-02-24 DIAGNOSIS — K219 Gastro-esophageal reflux disease without esophagitis: Secondary | ICD-10-CM | POA: Insufficient documentation

## 2017-02-24 DIAGNOSIS — Z9889 Other specified postprocedural states: Secondary | ICD-10-CM | POA: Diagnosis not present

## 2017-02-24 DIAGNOSIS — M5126 Other intervertebral disc displacement, lumbar region: Secondary | ICD-10-CM | POA: Diagnosis not present

## 2017-02-24 DIAGNOSIS — N369 Urethral disorder, unspecified: Secondary | ICD-10-CM | POA: Diagnosis present

## 2017-02-24 DIAGNOSIS — Z8042 Family history of malignant neoplasm of prostate: Secondary | ICD-10-CM | POA: Insufficient documentation

## 2017-02-24 DIAGNOSIS — Z791 Long term (current) use of non-steroidal anti-inflammatories (NSAID): Secondary | ICD-10-CM | POA: Insufficient documentation

## 2017-02-24 HISTORY — PX: TRANSURETHRAL RESECTION OF BLADDER TUMOR: SHX2575

## 2017-02-24 SURGERY — TURBT (TRANSURETHRAL RESECTION OF BLADDER TUMOR)
Anesthesia: General | Site: Bladder

## 2017-02-24 MED ORDER — PROMETHAZINE HCL 25 MG/ML IJ SOLN: 6.2500 mg | INTRAMUSCULAR | Status: DC | PRN

## 2017-02-24 MED ORDER — SODIUM CHLORIDE 0.9 % IR SOLN
Status: DC | PRN
  Administered 2017-02-24: 3000 mL

## 2017-02-24 MED ORDER — HYDROMORPHONE HCL 1 MG/ML IJ SOLN: 0.2500 mg | INTRAMUSCULAR | Status: DC | PRN

## 2017-02-24 MED ORDER — 0.9 % SODIUM CHLORIDE (POUR BTL) OPTIME
TOPICAL | Status: DC | PRN
  Administered 2017-02-24: 1000 mL

## 2017-02-24 MED ORDER — ONDANSETRON HCL 4 MG/2ML IJ SOLN
INTRAMUSCULAR | Status: DC | PRN
  Administered 2017-02-24: 4 mg via INTRAVENOUS

## 2017-02-24 MED ORDER — FENTANYL CITRATE (PF) 100 MCG/2ML IJ SOLN
INTRAMUSCULAR | Status: AC
  Filled 2017-02-24: qty 2

## 2017-02-24 MED ORDER — TRAMADOL HCL 50 MG PO TABS: 50.0000 mg | ORAL_TABLET | Freq: Four times a day (QID) | ORAL | Status: DC | PRN

## 2017-02-24 MED ORDER — FENTANYL CITRATE (PF) 100 MCG/2ML IJ SOLN
INTRAMUSCULAR | Status: DC | PRN
  Administered 2017-02-24 (×2): 50 ug via INTRAVENOUS

## 2017-02-24 MED ORDER — LACTATED RINGERS IV SOLN
INTRAVENOUS | Status: DC
  Administered 2017-02-24: 1000 mL via INTRAVENOUS
  Administered 2017-02-24: 18:00:00 via INTRAVENOUS

## 2017-02-24 MED ORDER — LIDOCAINE 2% (20 MG/ML) 5 ML SYRINGE
INTRAMUSCULAR | Status: DC | PRN
  Administered 2017-02-24: 100 mg via INTRAVENOUS

## 2017-02-24 MED ORDER — PROPOFOL 10 MG/ML IV BOLUS
INTRAVENOUS | Status: DC | PRN
  Administered 2017-02-24: 200 mg via INTRAVENOUS

## 2017-02-24 MED ORDER — TRAMADOL HCL 50 MG PO TABS: 50.0000 mg | ORAL_TABLET | Freq: Four times a day (QID) | ORAL | 0 refills | Status: DC | PRN

## 2017-02-24 SURGICAL SUPPLY — 14 items
BAG URINE DRAINAGE (UROLOGICAL SUPPLIES) IMPLANT
BAG URO CATCHER STRL LF (MISCELLANEOUS) ×3 IMPLANT
ELECT REM PT RETURN 9FT ADLT (ELECTROSURGICAL) ×3
ELECTRODE REM PT RTRN 9FT ADLT (ELECTROSURGICAL) ×1 IMPLANT
EVACUATOR MICROVAS BLADDER (UROLOGICAL SUPPLIES) IMPLANT
GLOVE BIOGEL M STRL SZ7.5 (GLOVE) ×3 IMPLANT
GOWN STRL REUS W/TWL LRG LVL3 (GOWN DISPOSABLE) ×6 IMPLANT
LOOP CUT BIPOLAR 24F LRG (ELECTROSURGICAL) ×3 IMPLANT
MANIFOLD NEPTUNE II (INSTRUMENTS) ×3 IMPLANT
PACK CYSTO (CUSTOM PROCEDURE TRAY) ×3 IMPLANT
SET ASPIRATION TUBING (TUBING) IMPLANT
SYRINGE IRR TOOMEY STRL 70CC (SYRINGE) IMPLANT
TUBING CONNECTING 10 (TUBING) ×2 IMPLANT
TUBING CONNECTING 10' (TUBING) ×1

## 2017-02-24 NOTE — Anesthesia Preprocedure Evaluation (Signed)
Anesthesia Evaluation  Patient identified by MRN, date of birth, ID band Patient awake    Reviewed: Allergy & Precautions, NPO status , Patient's Chart, lab work & pertinent test results  Airway Mallampati: II  TM Distance: >3 FB Neck ROM: Full    Dental no notable dental hx.    Pulmonary neg pulmonary ROS, former smoker,    Pulmonary exam normal breath sounds clear to auscultation       Cardiovascular hypertension, Pt. on medications Normal cardiovascular exam Rhythm:Regular Rate:Normal     Neuro/Psych negative neurological ROS  negative psych ROS   GI/Hepatic negative GI ROS, Neg liver ROS,   Endo/Other  negative endocrine ROS  Renal/GU negative Renal ROS  negative genitourinary   Musculoskeletal negative musculoskeletal ROS (+)   Abdominal   Peds negative pediatric ROS (+)  Hematology negative hematology ROS (+)   Anesthesia Other Findings   Reproductive/Obstetrics negative OB ROS                             Anesthesia Physical Anesthesia Plan  ASA: II  Anesthesia Plan: General   Post-op Pain Management:    Induction: Intravenous  Airway Management Planned: LMA  Additional Equipment:   Intra-op Plan:   Post-operative Plan: Extubation in OR  Informed Consent: I have reviewed the patients History and Physical, chart, labs and discussed the procedure including the risks, benefits and alternatives for the proposed anesthesia with the patient or authorized representative who has indicated his/her understanding and acceptance.   Dental advisory given  Plan Discussed with: CRNA and Surgeon  Anesthesia Plan Comments:         Anesthesia Quick Evaluation

## 2017-02-24 NOTE — Transfer of Care (Signed)
Immediate Anesthesia Transfer of Care Note  Patient: Jay Tran  Procedure(s) Performed: Procedure(s): TRANSURETHRAL RESECTION OF BLADDER TUMOR (TURBT) (N/A)  Patient Location: PACU  Anesthesia Type:General  Level of Consciousness:  sedated, patient cooperative and responds to stimulation  Airway & Oxygen Therapy:Patient Spontanous Breathing and Patient connected to face mask oxgen  Post-op Assessment:  Report given to PACU RN and Post -op Vital signs reviewed and stable  Post vital signs:  Reviewed and stable  Last Vitals:  Vitals:   02/24/17 1459  BP: (!) 151/91  Pulse: (!) 57  Resp: 18  Temp: 63.8 C    Complications: No apparent anesthesia complications

## 2017-02-24 NOTE — Discharge Instructions (Signed)
General Anesthesia, Adult, Care After °These instructions provide you with information about caring for yourself after your procedure. Your health care provider may also give you more specific instructions. Your treatment has been planned according to current medical practices, but problems sometimes occur. Call your health care provider if you have any problems or questions after your procedure. °What can I expect after the procedure? °After the procedure, it is common to have: °· Vomiting. °· A sore throat. °· Mental slowness. °It is common to feel: °· Nauseous. °· Cold or shivery. °· Sleepy. °· Tired. °· Sore or achy, even in parts of your body where you did not have surgery. °Follow these instructions at home: °For at least 24 hours after the procedure: °· Do not: °¨ Participate in activities where you could fall or become injured. °¨ Drive. °¨ Use heavy machinery. °¨ Drink alcohol. °¨ Take sleeping pills or medicines that cause drowsiness. °¨ Make important decisions or sign legal documents. °¨ Take care of children on your own. °· Rest. °Eating and drinking °· If you vomit, drink water, juice, or soup when you can drink without vomiting. °· Drink enough fluid to keep your urine clear or pale yellow. °· Make sure you have little or no nausea before eating solid foods. °· Follow the diet recommended by your health care provider. °General instructions °· Have a responsible adult stay with you until you are awake and alert. °· Return to your normal activities as told by your health care provider. Ask your health care provider what activities are safe for you. °· Take over-the-counter and prescription medicines only as told by your health care provider. °· If you smoke, do not smoke without supervision. °· Keep all follow-up visits as told by your health care provider. This is important. °Contact a health care provider if: °· You continue to have nausea or vomiting at home, and medicines are not helpful. °· You  cannot drink fluids or start eating again. °· You cannot urinate after 8-12 hours. °· You develop a skin rash. °· You have fever. °· You have increasing redness at the site of your procedure. °Get help right away if: °· You have difficulty breathing. °· You have chest pain. °· You have unexpected bleeding. °· You feel that you are having a life-threatening or urgent problem. °This information is not intended to replace advice given to you by your health care provider. Make sure you discuss any questions you have with your health care provider. °Document Released: 03/13/2001 Document Revised: 05/09/2016 Document Reviewed: 11/19/2015 °Elsevier Interactive Patient Education © 2017 [employer] °1 - You may have urinary urgency (bladder spasms) and bloody urine on / off x few days. This is normal. ° °2 - Call MD or go to ER for fever >102, severe pain / nausea / vomiting not relieved by medications, or acute change in medical status ° °

## 2017-02-24 NOTE — Anesthesia Procedure Notes (Addendum)
Procedure Name: LMA Insertion Performed by: Montel Clock Pre-anesthesia Checklist: Patient identified, Emergency Drugs available, Suction available, Patient being monitored and Timeout performed Patient Re-evaluated:Patient Re-evaluated prior to inductionOxygen Delivery Method: Circle system utilized Preoxygenation: Pre-oxygenation with 100% oxygen Intubation Type: IV induction Ventilation: Mask ventilation without difficulty LMA: LMA with gastric port inserted LMA Size: 4.0 Tube type: Oral Number of attempts: 1 Airway Equipment and Method: Stylet Placement Confirmation: positive ETCO2 and breath sounds checked- equal and bilateral Tube secured with: Tape Dental Injury: Teeth and Oropharynx as per pre-operative assessment

## 2017-02-24 NOTE — Anesthesia Postprocedure Evaluation (Addendum)
Anesthesia Post Note  Patient: Jay Tran  Procedure(s) Performed: Procedure(s) (LRB): TRANSURETHRAL RESECTION OF BLADDER TUMOR (TURBT) (N/A)  Patient location during evaluation: PACU Anesthesia Type: General Level of consciousness: awake and alert Pain management: pain level controlled Vital Signs Assessment: post-procedure vital signs reviewed and stable Respiratory status: spontaneous breathing, nonlabored ventilation, respiratory function stable and patient connected to nasal cannula oxygen Cardiovascular status: blood pressure returned to baseline and stable Postop Assessment: no signs of nausea or vomiting Anesthetic complications: no       Last Vitals:  Vitals:   02/24/17 1815 02/24/17 1824  BP: (!) 154/81 (!) 162/80  Pulse: 60   Resp: 19 17  Temp: 36.8 C 36.9 C    Last Pain:  Vitals:   02/24/17 1743  TempSrc:   PainSc: 0-No pain                 Kasper Mudrick S

## 2017-02-24 NOTE — Progress Notes (Signed)
Patient ambulated to bathroom and tolerated very well. Able to void moderate amount of urine which is pink in color with no clots. Denies pain.

## 2017-02-24 NOTE — Brief Op Note (Signed)
02/24/2017  5:40 PM  PATIENT:  Jay Tran  83 y.o. male  PRE-OPERATIVE DIAGNOSIS:  PROATIC URETHRAL MASS  POST-OPERATIVE DIAGNOSIS:  * No post-op diagnosis entered *  PROCEDURE:  Procedure(s): TRANSURETHRAL RESECTION OF BLADDER TUMOR (TURBT) (N/A)  SURGEON:  Surgeon(s) and Role:    * Alexis Frock, MD - Primary  PHYSICIAN ASSISTANT:   ASSISTANTS: none   ANESTHESIA:   general  EBL:  No intake/output data recorded.  BLOOD ADMINISTERED:none  DRAINS: none   LOCAL MEDICATIONS USED:  NONE  SPECIMEN:  Source of Specimen:  prostatic urethral mass  DISPOSITION OF SPECIMEN:  PATHOLOGY  COUNTS:  YES  TOURNIQUET:  * No tourniquets in log *  DICTATION: .Other Dictation: Dictation Number [id number]  PLAN OF CARE: Discharge to home after PACU  PATIENT DISPOSITION:  PACU - hemodynamically stable.   Delay start of Pharmacological VTE agent (>24hrs) due to surgical blood loss or risk of bleeding: yes

## 2017-02-24 NOTE — H&P (Signed)
Jay Tran is an 83 y.o. male.    Chief Complaint: Pre-op Transurethral Resection Bladder TUmor  HPI:   1 -  Lower Urinary Tract Symptoms / Prostatic Hypertrophy / Incomplete Emptying - manages with tamsulosin + finasteride for years. PVR's up to 300cc asymptomatic x several. Had episdoe frank retention 2011. He does have h/o L spine surgery as well. Prostate Vol 71mL with median lobe by CT 2018.    2 - Gross Hematuria / Small Prostatic Urethral Mass - few episdoes scant terminal hematuria 2018. CT w/o upper tract masses. 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    PMH sig for back surgery (no deficits). NO CV disease, NO strong blood thinners. His PCP is Dr. Arelia Sneddon.    Today Jay Tran is seen to proceed with cysto and resection of small prostatic urethral mass with goal of ruling out urothelial carcinoma. No interval fevers. Most recent UA without infectious parameters.    Past Medical History:  Diagnosis Date  . Arthritis   . BPH (benign prostatic hypertrophy)   . 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 GERD (gastroesophageal reflux disease)   . Hearing aid worn    Hx: of right ear  . Hyperlipemia   . Hypertension   . Lumbar herniated disc   . PONV (postoperative nausea and vomiting)   . Seasonal allergies    Hx: of    Past Surgical History:  Procedure Laterality Date  . COLONOSCOPY     Hx: of  . EYE SURGERY     bil catarcts 02/2016  . HERNIA REPAIR    . LUMBAR LAMINECTOMY  10/26/2012   Procedure: MICRODISCECTOMY LUMBAR LAMINECTOMY;  Surgeon: Marybelle Killings, MD;  Location: [place];  Service: Orthopedics;  Laterality: Right;  Right L4-5 Microdiscectomy  . MAXIMUM ACCESS (MAS)POSTERIOR LUMBAR INTERBODY FUSION (PLIF) 1 LEVEL N/A 04/15/2015   Procedure: Maximum Access Surgery Posterior Lumbar Interbody Fusion Lumbar two-Lumbar three extension of hardware;  Surgeon: Eustace Moore, MD;  Location: [place] [facility];  Service:  Neurosurgery;  Laterality: N/A;  . ROTATOR CUFF REPAIR     right shoulder - 3 times  . TONSILLECTOMY      Family History  Problem Relation Age of Onset  . Stroke Mother   . Cancer - Prostate Father    Social History:  reports that he quit smoking about 53 years ago. He has never used smokeless tobacco. He reports that he does not drink alcohol or use drugs.  Allergies: No Known Allergies  No prescriptions prior to admission.    No results found for this or any previous visit (from the past 48 hour(s)). No results found.  Review of Systems  Constitutional: Negative.  Negative for chills and fever.  HENT: Negative.   Eyes: Negative.   Respiratory: Negative.   Cardiovascular: Negative.   Gastrointestinal: Negative.   Genitourinary: Positive for hematuria. Negative for flank pain.  Musculoskeletal: Negative.   Skin: Negative.   Neurological: Negative.   Endo/Heme/Allergies: Negative.   Psychiatric/Behavioral: Negative.     There were no vitals taken for this visit. Physical Exam  Constitutional: He appears well-developed.  HENT:  Head: Normocephalic.  Eyes: Pupils are equal, round, and reactive to light.  Neck: Normal range of motion.  Cardiovascular: Normal rate.   Respiratory: Effort normal.  GI: Soft.  Genitourinary:  Genitourinary Comments: No CVAT.   Musculoskeletal: Normal range of motion.  Neurological: He is  alert.  Skin: Skin is warm.  Psychiatric: He has a normal mood and affect.     Assessment/Plan  Proceed as planned with TURBT small prostatic urethral lesion today as planned. Risks, benefits, alternatives, expected peri-op course, need for possible post-op temporary catheter discussed previously and reiterated today.   Alexis Frock, MD 02/24/2017, 5:57 AM

## 2017-02-27 ENCOUNTER — Encounter (HOSPITAL_COMMUNITY): Payer: Self-pay | Admitting: Urology

## 2017-02-27 NOTE — Op Note (Signed)
[name], [name]                 ACCOUNT NO.:  [id number]  MEDICAL RECORD NO.:  [id number]  LOCATION:                                 FACILITY:  PHYSICIAN:  Alexis Frock, MD          DATE OF BIRTH:  DATE OF PROCEDURE: 02/24/2017                              OPERATIVE REPORT   PREOPERATIVE DIAGNOSIS:  Hematuria with small prostatic urethral mass.  PROCEDURE:  Transurethral resection of bladder tumor, volume small.  ESTIMATED BLOOD LOSS:  Nil.  COMPLICATIONS:  None.  SPECIMEN:  Prostatic urethral mass for pathology.  FINDINGS: 1. Papillary lesion in prostatic urethra, less than 1 cm.     Documentation performed. 2. Unremarkable urinary bladder.  INDICATION:  [name] is a very pleasant and quite vigorous 83 year old gentleman with long-standing history of prostatic hypertrophy.  He has done well on medical therapy for years.  He recently developed terminal hematuria with a few small clots.  He underwent evaluation for this including CT urogram and office cystoscopy, which revealed a small papular lesions in his prostatic urethra.  Worrisome for possible urothelial carcinoma, clinically localized.  Options were discussed including attempted office biopsy versus operative transurethral resection of bladder being most definitive, and he wished to proceed. Informed consent was obtained and placed in the medical record.  PROCEDURE IN DETAIL:  The being [facility], procedure being transurethral resection of bladder tumor was confirmed.  Procedure was carried out.  Time-out was performed.  Intravenous antibiotics were administered.  General LMA anesthesia induced.  The patient was placed into a low lithotomy position and sterile field was created by prepping and draping the patient's penis, perineum, proximal thighs using iodine. Next, cystourethroscopy was performed using a 26-French resectoscope sheath with visual obturator.  Inspection of the anterior post urethra revealed  a small papillary lesion in the prostatic urethra just proximal to the verumontanum.  This was estimated to be approximately 7 mm in diameter.  Photodocumentation performed.  Inspection of the urinary bladder revealed no diverticula, calcifications, papillary lesions. There was mild trabeculation noted.  Ureteral orifices appeared singleton bilaterally.  Given his recently normal triple phase contrast imaging, it was not felt that retrograde pyelography was warranted.  As such, the resectoscope loop was used and systematic resection was performed of the small prostatic urethral mass.  This was set aside for pathology.  The base of this area was fulgurated, which resulted in excellent hemostasis.  The bladder was partially emptied.  Procedure was terminated.  The patient tolerated the procedure well.  There were no immediate periprocedural complications.  The patient was taken to the postanesthesia care unit in stable condition.    ______________________________ Alexis Frock, MD   ______________________________ Alexis Frock, MD    TM/MEDQ  D:  02/24/2017  T:  02/24/2017  Job:  [id number]

## 2017-03-29 ENCOUNTER — Ambulatory Visit (INDEPENDENT_AMBULATORY_CARE_PROVIDER_SITE_OTHER): Payer: Medicare Other | Admitting: Physician Assistant

## 2017-03-29 DIAGNOSIS — M25551 Pain in right hip: Secondary | ICD-10-CM

## 2017-03-29 DIAGNOSIS — M778 Other enthesopathies, not elsewhere classified: Secondary | ICD-10-CM | POA: Diagnosis not present

## 2017-03-29 DIAGNOSIS — G8929 Other chronic pain: Secondary | ICD-10-CM

## 2017-03-29 DIAGNOSIS — M25512 Pain in left shoulder: Secondary | ICD-10-CM | POA: Diagnosis not present

## 2017-03-29 DIAGNOSIS — M779 Enthesopathy, unspecified: Secondary | ICD-10-CM

## 2017-03-29 MED ORDER — METHYLPREDNISOLONE ACETATE 40 MG/ML IJ SUSP
40.0000 mg | INTRAMUSCULAR | Status: AC | PRN
  Administered 2017-03-29: 40 mg via INTRA_ARTICULAR

## 2017-03-29 MED ORDER — METHYLPREDNISOLONE ACETATE 40 MG/ML IJ SUSP
40.0000 mg | INTRAMUSCULAR | Status: AC | PRN
  Administered 2017-03-29: 40 mg via INTRAMUSCULAR

## 2017-03-29 MED ORDER — LIDOCAINE HCL 1 % IJ SOLN
3.0000 mL | INTRAMUSCULAR | Status: AC | PRN
  Administered 2017-03-29: 3 mL

## 2017-03-29 MED ORDER — LIDOCAINE HCL 1 % IJ SOLN
0.5000 mL | INTRAMUSCULAR | Status: AC | PRN
  Administered 2017-03-29: .5 mL

## 2017-03-29 NOTE — Progress Notes (Signed)
Office Visit Note   Patient: Jay Tran           Date of Birth: Jan 15, 1934           MRN: [id number] Visit Date: 03/29/2017              Requested by: Leonard Downing, MD [address] [place], [place] [address] PCP: Leonard Downing, MD   Assessment & Plan: Visit Diagnoses:  Right buttocks pain Left shoulder pain  Plan: We'll see him back on an as-needed basis. Dr. Ninfa Linden and myself both discussed with him that in regards to his shoulder would not recommend another  subacromial injection until 4-5 months passed from this last injection. Questions encouraged and answered.  Follow-Up Instructions: Return if symptoms worsen or fail to improve.   Orders:  No orders of the defined types were placed in this encounter.  No orders of the defined types were placed in this encounter.     Procedures: Large Joint Inj Date/Time: 03/29/2017 11:15 AM Performed by: Pete Pelt Authorized by: Pete Pelt   Consent Given by:  Patient Indications:  Pain Location:  Shoulder Site:  L subacromial bursa Needle Size:  25 G Needle Length:  1.5 inches and 3.5 inches Approach:  Anterolateral Ultrasound Guidance: No   Fluoroscopic Guidance: No   Arthrogram: No   Medications:  40 mg methylPREDNISolone acetate 40 MG/ML; 3 mL lidocaine 1 % Aspiration Attempted: No   Patient tolerance:  Patient tolerated the procedure well with no immediate complications Trigger Point Inj Date/Time: 03/29/2017 11:16 AM Performed by: Pete Pelt Authorized by: Pete Pelt   Consent Given by:  Patient Site marked: the procedure site was marked   Indications:  Pain Total # of Trigger Points:  1 Location: back   Needle Size:  25 G Approach:  Dorsal Medications #1:  0.5 mL lidocaine 1 %; 40 mg methylPREDNISolone acetate 40 MG/ML Patient tolerance:  Patient tolerated the procedure well with no immediate complications Comments: Right SI joint region.      Clinical  Data: No additional findings.   Subjective: Left shoulder pain Right hip pain  HPI Jay Tran 83 year old male who is well-known Dr. Ninfa Linden services had a recent subacromial injection to 2618. States that his pain is returns keep him awake and he is asking for one more shot. He has a history of the left shoulder acromioclavicular joint separation injury. He is also complaining of right buttocks pain. He had a SI joint injections bilaterally by Dr. Ernestina Patches back in February and states that these did not help. He states the pain in this area is constant. He is having no radicular symptoms down either leg.   Review of Systems   Objective: Vital Signs: There were no vitals taken for this visit.  Physical Exam  Constitutional: He is oriented to person, place, and time. He appears well-developed and well-nourished. No distress.  Pulmonary/Chest: Effort normal.  Neurological: He is alert and oriented to person, place, and time.  Psychiatric: He has a normal mood and affect.    Ortho Exam Left shoulder he has an old before meals joint separation injury. Overall good range of motion the shoulder he has slight weakness with external rotation of left shoulder against resistance. Negative empty can test. Positive impingement on the left.  Right lower lumbar nontender. He has well-healed midline surgical incision lumbar spine. Tenderness over the region of the right SI joint. Negative Patrick's test bilaterally. Specialty Comments:  No specialty comments available.  Imaging: No results found.   PMFS History: Patient Active Problem List   Diagnosis Date Noted  . Chronic pain of both shoulders 02/22/2017  . S/P lumbar spinal fusion 04/15/2015  . HNP (herniated nucleus pulposus), lumbar 10/26/2012    Class: Diagnosis of   Past Medical History:  Diagnosis Date  . Arthritis   . BPH (benign prostatic hypertrophy)   . 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 GERD  (gastroesophageal reflux disease)   . Hearing aid worn    Hx: of right ear  . Hyperlipemia   . Hypertension   . Lumbar herniated disc   . PONV (postoperative nausea and vomiting)   . Seasonal allergies    Hx: of    Family History  Problem Relation Age of Onset  . Stroke Mother   . Cancer - Prostate Father     Past Surgical History:  Procedure Laterality Date  . COLONOSCOPY     Hx: of  . EYE SURGERY     bil catarcts 02/2016  . HERNIA REPAIR    . LUMBAR LAMINECTOMY  10/26/2012   Procedure: MICRODISCECTOMY LUMBAR LAMINECTOMY;  Surgeon: Marybelle Killings, MD;  Location: [facility];  Service: Orthopedics;  Laterality: Right;  Right L4-5 Microdiscectomy  . MAXIMUM ACCESS (MAS)POSTERIOR LUMBAR INTERBODY FUSION (PLIF) 1 LEVEL N/A 04/15/2015   Procedure: Maximum Access Surgery Posterior Lumbar Interbody Fusion Lumbar two-Lumbar three extension of hardware;  Surgeon: Eustace Moore, MD;  Location: [facility];  Service: Neurosurgery;  Laterality: N/A;  . ROTATOR CUFF REPAIR     right shoulder - 3 times  . TONSILLECTOMY    . TRANSURETHRAL RESECTION OF BLADDER TUMOR N/A 02/24/2017   Procedure: TRANSURETHRAL RESECTION OF BLADDER TUMOR (TURBT);  Surgeon: Alexis Frock, MD;  Location: [facility];  Service: Urology;  Laterality: N/A;   Social History   Occupational History  . Not on file.   Social History Main Topics  . Smoking status: Former Smoker    Quit date: 11/12/1963  . Smokeless tobacco: Never Used  . Alcohol use No  . Drug use: No  . Sexual activity: Yes

## 2017-04-28 ENCOUNTER — Encounter (INDEPENDENT_AMBULATORY_CARE_PROVIDER_SITE_OTHER): Payer: Medicare Other | Admitting: Ophthalmology

## 2017-04-28 DIAGNOSIS — H35033 Hypertensive retinopathy, bilateral: Secondary | ICD-10-CM | POA: Diagnosis not present

## 2017-04-28 DIAGNOSIS — I10 Essential (primary) hypertension: Secondary | ICD-10-CM

## 2017-04-28 DIAGNOSIS — H353111 Nonexudative age-related macular degeneration, right eye, early dry stage: Secondary | ICD-10-CM

## 2017-04-28 DIAGNOSIS — H353122 Nonexudative age-related macular degeneration, left eye, intermediate dry stage: Secondary | ICD-10-CM

## 2017-04-28 DIAGNOSIS — H43813 Vitreous degeneration, bilateral: Secondary | ICD-10-CM | POA: Diagnosis not present

## 2017-05-22 NOTE — Addendum Note (Signed)
Addendum  created 05/22/17 1215 by Treysen Sudbeck, MD   Sign clinical note    

## 2017-12-14 ENCOUNTER — Telehealth (INDEPENDENT_AMBULATORY_CARE_PROVIDER_SITE_OTHER): Payer: Self-pay | Admitting: Radiology

## 2017-12-14 MED ORDER — TIZANIDINE HCL 4 MG PO TABS: ORAL_TABLET | ORAL | 0 refills | Status: DC

## 2017-12-14 MED ORDER — ACETAMINOPHEN-CODEINE #3 300-30 MG PO TABS: 1.0000 | ORAL_TABLET | Freq: Three times a day (TID) | ORAL | 0 refills | Status: DC | PRN

## 2017-12-14 NOTE — Telephone Encounter (Signed)
Ok to call in Zanaflex 4 mg po bid to tid prn spasms as well as tylenol #3 1-2 every 8 hours as needed for pain; #60 for both. Thanks

## 2017-12-14 NOTE — Telephone Encounter (Signed)
I called both medications in and entered in module, called patient to make aware

## 2017-12-14 NOTE — Telephone Encounter (Signed)
Patient calling triage today complaining of ongoing back pain for the past week. He is wanting an appointment with Dr. Ninfa Linden or Artis Delay today or tomorrow. Advised due to surgical schedule I had nothing to offer him with either providers. Did made an appointment with Artis Delay next week.   He is wanting to know if he can have a muscle relaxer in the meantime. He uses the [facility] on [address]. Advised that Dr. Ninfa Linden is in surgery this morning and late into the afternoon and I would not have a response about medication until late into the day. He expressed understanding.

## 2017-12-14 NOTE — Addendum Note (Signed)
Addended by: Maxcine Ham on: 12/14/2017 09:16 AM   Modules accepted: Orders

## 2017-12-21 ENCOUNTER — Ambulatory Visit (INDEPENDENT_AMBULATORY_CARE_PROVIDER_SITE_OTHER): Payer: Medicare Other | Admitting: Physician Assistant

## 2017-12-21 ENCOUNTER — Encounter (INDEPENDENT_AMBULATORY_CARE_PROVIDER_SITE_OTHER): Payer: Self-pay | Admitting: Physician Assistant

## 2017-12-21 ENCOUNTER — Ambulatory Visit (INDEPENDENT_AMBULATORY_CARE_PROVIDER_SITE_OTHER): Payer: Self-pay

## 2017-12-21 DIAGNOSIS — G8929 Other chronic pain: Secondary | ICD-10-CM | POA: Diagnosis not present

## 2017-12-21 DIAGNOSIS — M545 Low back pain, unspecified: Secondary | ICD-10-CM

## 2017-12-21 MED ORDER — METHYLPREDNISOLONE 4 MG PO TABS: ORAL_TABLET | ORAL | 0 refills | Status: DC

## 2017-12-21 NOTE — Progress Notes (Signed)
Office Visit Note   Patient: Jay Tran           Date of Birth: [birth year]           MRN: [id number] Visit Date: 12/21/2017              Requested by: Leonard Downing, MD [place], [place] [address] PCP: Leonard Downing, MD   Assessment & Plan: Visit Diagnoses:  1. Chronic low back pain without sciatica, unspecified back pain laterality     Plan: Place him on a Medrol Dosepak to see if this helps dissipate his low back pain and radicular symptoms right leg.  Discussed with him physical therapy or exercises he states this typically only causes increased pain in his low back and therefore will forego.  If his pain does not dissipate with the Medrol Dosepak time or becomes worse we will have him follow-up with Dr. Ronnald Ramp for further workup of his back as a source of his pain.  Follow-Up Instructions: Return if symptoms worsen or fail to improve.   Orders:  Orders Placed This Encounter  Procedures  . XR Lumbar Spine 2-3 Views   Meds ordered this encounter  Medications  . methylPREDNISolone (MEDROL) 4 MG tablet    Sig: Take as directed    Dispense:  21 tablet    Refill:  0      Procedures: No procedures performed   Clinical Data: No additional findings.   Subjective: Low back pain and right-sided hip pain  HPI  Jay Tran comes in today due to pain in his right hip and low back is been ongoing for the past 2 weeks.  He reports he was trying to mattress and felt pain in his low back and down the lateral aspect of his right thigh.  States he then went and took a bath and then had stabbing pain down the right leg laterally but did not go past the knee.  Is now having right buttocks pain that periodically radiates down the lateral aspect of his thigh to his knee.  Pain does awaken him.  He tried Zanaflex and Tylenol 3.  The Tylenol 3 caused him to itch therefore he stopped taking it.  He states that Tylenol PM helps the most with his pain.  His  pain is somewhat dissipating.  Had no bowel bladder dysfunction.  Previous history of back surgery by Dr. Ronnald Ramp in the past.  Review of Systems Please see HPI otherwise negative  Objective: Vital Signs: There were no vitals taken for this visit.  Physical Exam  Constitutional: He is oriented to person, place, and time. He appears well-developed and well-nourished. No distress.  Cardiovascular: Intact distal pulses.  Pulmonary/Chest: Effort normal.  Neurological: He is alert and oriented to person, place, and time.  Skin: He is not diaphoretic.  Psychiatric: He has a normal mood and affect.    Ortho Exam 5 out of 5 strength throughout the lower extremities.  Positive straight leg raise on the right.  Tenderness with palpation over the lower lumbar and sacral paraspinous region on the right.  He has no tenderness over the IT band and greater trochanteric region of the right hip.  Exquisitely tight hamstrings bilaterally.  Good range of motion of both hips without pain.  He is able to walk on his tiptoes and heels. Specialty Comments:  No specialty comments available.  Imaging: Xr Lumbar Spine 2-3 Views  Result Date: 12/21/2017  Lumbar spine AP  and lateral views: No acute fracture.  Hardware from L2-L5 without any signs of failure.  Normal anatomic alignment.  Interbody spacing material is noted up to 3 L3-4 and L4-5.    PMFS History: Patient Active Problem List   Diagnosis Date Noted  . Chronic pain of both shoulders 02/22/2017  . S/P lumbar spinal fusion 04/15/2015  . HNP (herniated nucleus pulposus), lumbar 10/26/2012    Class: Diagnosis of   Past Medical History:  Diagnosis Date  . Arthritis   . BPH (benign prostatic hypertrophy)   . 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 GERD (gastroesophageal reflux disease)   . Hearing aid worn    Hx: of right ear  . Hyperlipemia   . Hypertension   . Lumbar herniated disc   . PONV (postoperative nausea and  vomiting)   . Seasonal allergies    Hx: of    Family History  Problem Relation Age of Onset  . Stroke Mother   . Cancer - Prostate Father     Past Surgical History:  Procedure Laterality Date  . COLONOSCOPY     Hx: of  . EYE SURGERY     bil catarcts 02/2016  . HERNIA REPAIR    . LUMBAR LAMINECTOMY  10/26/2012   Procedure: MICRODISCECTOMY LUMBAR LAMINECTOMY;  Surgeon: Marybelle Killings, MD;  Location: [facility];  Service: Orthopedics;  Laterality: Right;  Right L4-5 Microdiscectomy  . MAXIMUM ACCESS (MAS)POSTERIOR LUMBAR INTERBODY FUSION (PLIF) 1 LEVEL N/A 04/15/2015   Procedure: Maximum Access Surgery Posterior Lumbar Interbody Fusion Lumbar two-Lumbar three extension of hardware;  Surgeon: Eustace Moore, MD;  Location: [facility];  Service: Neurosurgery;  Laterality: N/A;  . ROTATOR CUFF REPAIR     right shoulder - 3 times  . TONSILLECTOMY    . TRANSURETHRAL RESECTION OF BLADDER TUMOR N/A 02/24/2017   Procedure: TRANSURETHRAL RESECTION OF BLADDER TUMOR (TURBT);  Surgeon: Alexis Frock, MD;  Location: [facility];  Service: Urology;  Laterality: N/A;   Social History   Occupational History  . Not on file  Tobacco Use  . Smoking status: Former Smoker    Last attempt to quit: 11/12/1963    Years since quitting: 54.1  . Smokeless tobacco: Never Used  Substance and Sexual Activity  . Alcohol use: No  . Drug use: No  . Sexual activity: Yes

## 2018-01-08 ENCOUNTER — Telehealth (INDEPENDENT_AMBULATORY_CARE_PROVIDER_SITE_OTHER): Payer: Self-pay | Admitting: Physician Assistant

## 2018-01-08 NOTE — Telephone Encounter (Signed)
See below I don't see anything about an MRI, only to go back to his back doctor

## 2018-01-08 NOTE — Telephone Encounter (Signed)
Called he will folow up with Dr. Ronnald Ramp

## 2018-01-08 NOTE — Telephone Encounter (Signed)
Patient states he was given Prednisone at last visit with Artis Delay and it worked for 7 days and then his back pain came back "worse than ever". Artis Delay told him the next rsteps would be an MRI, can you go ahead and put in an order for him? He would also like a call back from him if possible. CB # [phone]

## 2018-01-08 NOTE — Telephone Encounter (Signed)
He wants YOU to call him

## 2018-01-08 NOTE — Telephone Encounter (Signed)
Yes Dr. Ronnald Ramp would have him go see him because he can order the MRI where and the way he wants it to be done

## 2018-01-25 ENCOUNTER — Other Ambulatory Visit: Payer: Self-pay | Admitting: Neurological Surgery

## 2018-01-25 DIAGNOSIS — M545 Low back pain: Secondary | ICD-10-CM

## 2018-01-26 ENCOUNTER — Ambulatory Visit
Admission: RE | Admit: 2018-01-26 | Discharge: 2018-01-26 | Disposition: A | Discharge status: Home / Self Care | Payer: Medicare Other | Source: Ambulatory Visit | Attending: Neurological Surgery | Admitting: Neurological Surgery

## 2018-01-26 DIAGNOSIS — M545 Low back pain: Secondary | ICD-10-CM

## 2018-02-15 ENCOUNTER — Ambulatory Visit: Payer: Medicare Other | Admitting: Allergy & Immunology

## 2018-07-30 ENCOUNTER — Other Ambulatory Visit: Payer: Self-pay | Admitting: Neurological Surgery

## 2018-08-02 NOTE — Pre-Procedure Instructions (Signed)
Jay Tran  08/02/2018      [place] ([place]), [place] [address] [place] ([place]) [place] [address] Phone: [phone] Fax: [phone]    Your procedure is scheduled on Aug.22  Report to [facility] Admitting at 11:40 A.M.  Call this number if you have problems the morning of surgery:  [phone]   Remember:  Do not eat or drink after midnight.                        Take these medicines the morning of surgery with A SIP OF WATER :              Ranitidine (Zantac) if needed             Eye drops if needed             Tizanidine(zanaflex0 if needed                 7 days prior to surgery STOP taking any Aspirin(unless otherwise instructed by your surgeon), Aleve, Naproxen, Ibuprofen, Motrin, Advil, Goody's, BC's, all herbal medications, fish oil, and all vitamins                  Do not wear jewelry.  Do not wear lotions, powders, or perfumes, or deodorant.  Do not shave 48 hours prior to surgery.  Men may shave face and neck.  Do not bring valuables to the hospital.  [facility] is not responsible for any belongings or valuables.  Contacts, dentures or bridgework may not be worn into surgery.  Leave your suitcase in the car.  After surgery it may be brought to your room.  For patients admitted to the hospital, discharge time will be determined by your treatment team.  Patients discharged the day of surgery will not be allowed to drive home.    Special instructions:   [facility]- Preparing For Surgery  Before surgery, you can play an important role. Because skin is not sterile, your skin needs to be as free of germs as possible. You can reduce the number of germs on your skin by washing with CHG (chlorahexidine gluconate) Soap before surgery.  CHG is an antiseptic cleaner which kills germs and bonds with the skin to continue killing germs even after washing.    Oral Hygiene is also important to reduce your  risk of infection.  Remember - BRUSH YOUR TEETH THE MORNING OF SURGERY WITH YOUR REGULAR TOOTHPASTE  Please do not use if you have an allergy to CHG or antibacterial soaps. If your skin becomes reddened/irritated stop using the CHG.  Do not shave (including legs and underarms) for at least 48 hours prior to first CHG shower. It is OK to shave your face.  Please follow these instructions carefully.   1. Shower the NIGHT BEFORE SURGERY and the MORNING OF SURGERY with CHG.   2. If you chose to wash your hair, wash your hair first as usual with your normal shampoo.  3. After you shampoo, rinse your hair and body thoroughly to remove the shampoo.  4. Use CHG as you would any other liquid soap. You can apply CHG directly to the skin and wash gently with a scrungie or a clean washcloth.   5. Apply the CHG Soap to your body ONLY FROM THE NECK DOWN.  Do not use on open wounds or open sores. Avoid  contact with your eyes, ears, mouth and genitals (private parts). Wash Face and genitals (private parts)  with your normal soap.  6. Wash thoroughly, paying special attention to the area where your surgery will be performed.  7. Thoroughly rinse your body with warm water from the neck down.  8. DO NOT shower/wash with your normal soap after using and rinsing off the CHG Soap.  9. Pat yourself dry with a CLEAN TOWEL.  10. Wear CLEAN PAJAMAS to bed the night before surgery, wear comfortable clothes the morning of surgery  11. Place CLEAN SHEETS on your bed the night of your first shower and DO NOT SLEEP WITH PETS.    Day of Surgery:  Do not apply any deodorants/lotions.  Please wear clean clothes to the hospital/surgery center.   Remember to brush your teeth WITH YOUR REGULAR TOOTHPASTE.    Please read over the following fact sheets that you were given. Coughing and Deep Breathing, MRSA Information and Surgical Site Infection Prevention

## 2018-08-03 ENCOUNTER — Encounter (HOSPITAL_COMMUNITY)
Admission: RE | Admit: 2018-08-03 | Discharge: 2018-08-03 | Disposition: A | Discharge status: Home / Self Care | Payer: Medicare Other | Source: Ambulatory Visit | Attending: Neurological Surgery | Admitting: Neurological Surgery

## 2018-08-03 ENCOUNTER — Other Ambulatory Visit: Payer: Self-pay

## 2018-08-03 ENCOUNTER — Encounter (HOSPITAL_COMMUNITY): Payer: Self-pay

## 2018-08-03 DIAGNOSIS — Z01812 Encounter for preprocedural laboratory examination: Secondary | ICD-10-CM | POA: Diagnosis not present

## 2018-08-03 DIAGNOSIS — Z01810 Encounter for preprocedural cardiovascular examination: Secondary | ICD-10-CM | POA: Insufficient documentation

## 2018-08-03 HISTORY — DX: Spinal stenosis, lumbar region without neurogenic claudication: M48.061

## 2018-08-03 LAB — CBC WITH DIFFERENTIAL/PLATELET
Abs Immature Granulocytes: 0.1 10*3/uL (ref 0.0–0.1)
Basophils Absolute: 0.1 10*3/uL (ref 0.0–0.1)
Basophils Relative: 1 %
Eosinophils Absolute: 0.3 10*3/uL (ref 0.0–0.7)
Eosinophils Relative: 3 %
HCT: 44.8 % (ref 39.0–52.0)
Hemoglobin: 14.7 g/dL (ref 13.0–17.0)
Immature Granulocytes: 1 %
Lymphocytes Relative: 14 %
Lymphs Abs: 1.3 10*3/uL (ref 0.7–4.0)
MCH: 32.4 pg (ref 26.0–34.0)
MCHC: 32.8 g/dL (ref 30.0–36.0)
MCV: 98.7 fL (ref 78.0–100.0)
Monocytes Absolute: 1.1 10*3/uL — ABNORMAL HIGH (ref 0.1–1.0)
Monocytes Relative: 12 %
Neutro Abs: 6.5 10*3/uL (ref 1.7–7.7)
Neutrophils Relative %: 69 %
Platelets: 249 10*3/uL (ref 150–400)
RBC: 4.54 MIL/uL (ref 4.22–5.81)
RDW: 12.6 % (ref 11.5–15.5)
WBC: 9.3 10*3/uL (ref 4.0–10.5)

## 2018-08-03 LAB — BASIC METABOLIC PANEL
Anion gap: 8 (ref 5–15)
BUN: 12 mg/dL (ref 8–23)
CO2: 28 mmol/L (ref 22–32)
Calcium: 9.5 mg/dL (ref 8.9–10.3)
Chloride: 100 mmol/L (ref 98–111)
Creatinine, Ser: 0.94 mg/dL (ref 0.61–1.24)
GFR calc Af Amer: >60 mL/min (ref >60)
GFR calc non Af Amer: >60 mL/min (ref >60)
Glucose, Bld: 105 mg/dL — ABNORMAL HIGH (ref 70–99)
Potassium: 3.7 mmol/L (ref 3.5–5.1)
Sodium: 136 mmol/L (ref 135–145)

## 2018-08-03 LAB — TYPE AND SCREEN
ABO/RH(D): A POS
Antibody Screen: NEGATIVE

## 2018-08-03 LAB — SURGICAL PCR SCREEN
MRSA, PCR: NEGATIVE
Staphylococcus aureus: NEGATIVE

## 2018-08-03 LAB — PROTIME-INR
INR: 1.02
Prothrombin Time: 13.3 seconds (ref 11.4–15.2)

## 2018-08-03 NOTE — Progress Notes (Signed)
PCP - Dr. Claris Gower  Cardiologist - Denies  Chest x-ray - 08/09/18  EKG - 08/03/18  Stress Test - Denies  ECHO - Denies  Cardiac Cath - Denies  AICD- Denies PM- Denies LOOP- Denies  Sleep Study - Denies CPAP - None  LABS- 08/03/18: CBC w/D, BMP, PT, T/S, PCR  ASA- Denies  Anesthesia- No  Pt denies having chest pain, sob, or fever at this time. All instructions explained to the pt, with a verbal understanding of the material. Pt agrees to go over the instructions while at home for a better understanding. The opportunity to ask questions was provided.

## 2018-08-09 ENCOUNTER — Inpatient Hospital Stay (HOSPITAL_COMMUNITY)
Admission: RE | Disposition: A | Discharge status: Home / Self Care | Payer: Self-pay | Source: Ambulatory Visit | Attending: Neurological Surgery

## 2018-08-09 ENCOUNTER — Inpatient Hospital Stay (HOSPITAL_COMMUNITY): Payer: Medicare Other | Admitting: Certified Registered"

## 2018-08-09 ENCOUNTER — Inpatient Hospital Stay (HOSPITAL_COMMUNITY)
Admission: RE | Admit: 2018-08-09 | Discharge: 2018-08-11 | DRG: 454 | Disposition: A | Discharge status: Home / Self Care | Payer: Medicare Other | Source: Ambulatory Visit | Attending: Neurological Surgery | Admitting: Neurological Surgery

## 2018-08-09 ENCOUNTER — Other Ambulatory Visit: Payer: Self-pay

## 2018-08-09 ENCOUNTER — Inpatient Hospital Stay (HOSPITAL_COMMUNITY): Payer: Medicare Other

## 2018-08-09 ENCOUNTER — Encounter (HOSPITAL_COMMUNITY): Payer: Self-pay | Admitting: *Deleted

## 2018-08-09 DIAGNOSIS — M545 Low back pain: Secondary | ICD-10-CM | POA: Diagnosis present

## 2018-08-09 DIAGNOSIS — K219 Gastro-esophageal reflux disease without esophagitis: Secondary | ICD-10-CM | POA: Diagnosis present

## 2018-08-09 DIAGNOSIS — Y832 Surgical operation with anastomosis, bypass or graft as the cause of abnormal reaction of the patient, or of later complication, without mention of misadventure at the time of the procedure: Secondary | ICD-10-CM | POA: Diagnosis present

## 2018-08-09 DIAGNOSIS — Z87891 Personal history of nicotine dependence: Secondary | ICD-10-CM | POA: Diagnosis not present

## 2018-08-09 DIAGNOSIS — E785 Hyperlipidemia, unspecified: Secondary | ICD-10-CM | POA: Diagnosis present

## 2018-08-09 DIAGNOSIS — Z86718 Personal history of other venous thrombosis and embolism: Secondary | ICD-10-CM

## 2018-08-09 DIAGNOSIS — Z981 Arthrodesis status: Secondary | ICD-10-CM

## 2018-08-09 DIAGNOSIS — M5126 Other intervertebral disc displacement, lumbar region: Secondary | ICD-10-CM | POA: Diagnosis present

## 2018-08-09 DIAGNOSIS — Z419 Encounter for procedure for purposes other than remedying health state, unspecified: Secondary | ICD-10-CM

## 2018-08-09 DIAGNOSIS — M960 Pseudarthrosis after fusion or arthrodesis: Secondary | ICD-10-CM | POA: Diagnosis present

## 2018-08-09 DIAGNOSIS — M48061 Spinal stenosis, lumbar region without neurogenic claudication: Principal | ICD-10-CM | POA: Diagnosis present

## 2018-08-09 DIAGNOSIS — I10 Essential (primary) hypertension: Secondary | ICD-10-CM | POA: Diagnosis present

## 2018-08-09 DIAGNOSIS — Z7951 Long term (current) use of inhaled steroids: Secondary | ICD-10-CM | POA: Diagnosis not present

## 2018-08-09 SURGERY — POSTERIOR LUMBAR FUSION 2 WITH HARDWARE REMOVAL
Anesthesia: General | Site: Back

## 2018-08-09 MED ORDER — PHENYLEPHRINE HCL 10 MG/ML IJ SOLN
INTRAMUSCULAR | Status: DC | PRN
  Administered 2018-08-09: 80 ug via INTRAVENOUS

## 2018-08-09 MED ORDER — VANCOMYCIN HCL 1000 MG IV SOLR
INTRAVENOUS | Status: AC
  Filled 2018-08-09: qty 1000

## 2018-08-09 MED ORDER — PHENOL 1.4 % MT LIQD: 1.0000 | OROMUCOSAL | Status: DC | PRN

## 2018-08-09 MED ORDER — ACETAMINOPHEN 650 MG RE SUPP: 650.0000 mg | RECTAL | Status: DC | PRN

## 2018-08-09 MED ORDER — FINASTERIDE 5 MG PO TABS
5.0000 mg | ORAL_TABLET | Freq: Every evening | ORAL | Status: DC
  Administered 2018-08-10: 5 mg via ORAL
  Filled 2018-08-09: qty 1

## 2018-08-09 MED ORDER — OXYCODONE HCL 5 MG PO TABS
ORAL_TABLET | ORAL | Status: AC
  Filled 2018-08-09: qty 1

## 2018-08-09 MED ORDER — POTASSIUM CHLORIDE IN NACL 20-0.9 MEQ/L-% IV SOLN
INTRAVENOUS | Status: DC
  Administered 2018-08-09 – 2018-08-10 (×2): via INTRAVENOUS
  Filled 2018-08-09 (×2): qty 1000

## 2018-08-09 MED ORDER — METHOCARBAMOL 1000 MG/10ML IJ SOLN
500.0000 mg | Freq: Four times a day (QID) | INTRAVENOUS | Status: DC | PRN
  Filled 2018-08-09: qty 5

## 2018-08-09 MED ORDER — SCOPOLAMINE 1 MG/3DAYS TD PT72
MEDICATED_PATCH | TRANSDERMAL | Status: AC
  Administered 2018-08-09: 1.5 mg via TRANSDERMAL
  Filled 2018-08-09: qty 1

## 2018-08-09 MED ORDER — CHLORHEXIDINE GLUCONATE CLOTH 2 % EX PADS: 6.0000 | MEDICATED_PAD | Freq: Once | CUTANEOUS | Status: DC

## 2018-08-09 MED ORDER — HYDROCHLOROTHIAZIDE 25 MG PO TABS
50.0000 mg | ORAL_TABLET | Freq: Every day | ORAL | Status: DC
  Administered 2018-08-10 – 2018-08-11 (×2): 50 mg via ORAL
  Filled 2018-08-09 (×2): qty 2

## 2018-08-09 MED ORDER — FENTANYL CITRATE (PF) 100 MCG/2ML IJ SOLN: 25.0000 ug | INTRAMUSCULAR | Status: DC | PRN

## 2018-08-09 MED ORDER — PROPOFOL 10 MG/ML IV BOLUS
INTRAVENOUS | Status: AC
  Filled 2018-08-09: qty 20

## 2018-08-09 MED ORDER — CELECOXIB 200 MG PO CAPS
200.0000 mg | ORAL_CAPSULE | Freq: Two times a day (BID) | ORAL | Status: DC
  Administered 2018-08-09 – 2018-08-11 (×4): 200 mg via ORAL
  Filled 2018-08-09 (×4): qty 1

## 2018-08-09 MED ORDER — EPINEPHRINE PF 1 MG/10ML IJ SOSY
10.0000 ug | PREFILLED_SYRINGE | Freq: Once | INTRAMUSCULAR | Status: AC
  Administered 2018-08-09: 10 ug via INTRAVENOUS

## 2018-08-09 MED ORDER — TAMSULOSIN HCL 0.4 MG PO CAPS
0.4000 mg | ORAL_CAPSULE | Freq: Every evening | ORAL | Status: DC
  Administered 2018-08-10: 0.4 mg via ORAL
  Filled 2018-08-09: qty 1

## 2018-08-09 MED ORDER — ONDANSETRON HCL 4 MG/2ML IJ SOLN
INTRAMUSCULAR | Status: AC
  Filled 2018-08-09: qty 2

## 2018-08-09 MED ORDER — HEPARIN SODIUM (PORCINE) 1000 UNIT/ML IJ SOLN
INTRAMUSCULAR | Status: AC
  Filled 2018-08-09: qty 1

## 2018-08-09 MED ORDER — LISINOPRIL 20 MG PO TABS
20.0000 mg | ORAL_TABLET | Freq: Every day | ORAL | Status: DC
  Administered 2018-08-10 – 2018-08-11 (×2): 20 mg via ORAL
  Filled 2018-08-09 (×2): qty 1

## 2018-08-09 MED ORDER — PHENYLEPHRINE 40 MCG/ML (10ML) SYRINGE FOR IV PUSH (FOR BLOOD PRESSURE SUPPORT)
PREFILLED_SYRINGE | INTRAVENOUS | Status: AC
  Filled 2018-08-09: qty 10

## 2018-08-09 MED ORDER — ACETAMINOPHEN 10 MG/ML IV SOLN
INTRAVENOUS | Status: AC
  Filled 2018-08-09: qty 100

## 2018-08-09 MED ORDER — ONDANSETRON HCL 4 MG/2ML IJ SOLN
INTRAMUSCULAR | Status: DC | PRN
  Administered 2018-08-09: 4 mg via INTRAVENOUS

## 2018-08-09 MED ORDER — CEFAZOLIN SODIUM-DEXTROSE 2-4 GM/100ML-% IV SOLN
2.0000 g | INTRAVENOUS | Status: AC
  Administered 2018-08-09: 2 g via INTRAVENOUS
  Filled 2018-08-09: qty 100

## 2018-08-09 MED ORDER — ACETAMINOPHEN 10 MG/ML IV SOLN
INTRAVENOUS | Status: DC | PRN
  Administered 2018-08-09: 1000 mg via INTRAVENOUS

## 2018-08-09 MED ORDER — LIDOCAINE 2% (20 MG/ML) 5 ML SYRINGE
INTRAMUSCULAR | Status: AC
  Filled 2018-08-09: qty 5

## 2018-08-09 MED ORDER — EPHEDRINE SULFATE-NACL 50-0.9 MG/10ML-% IV SOSY
PREFILLED_SYRINGE | INTRAVENOUS | Status: DC | PRN
  Administered 2018-08-09: 5 mg via INTRAVENOUS
  Administered 2018-08-09: 10 mg via INTRAVENOUS

## 2018-08-09 MED ORDER — FENTANYL CITRATE (PF) 250 MCG/5ML IJ SOLN
INTRAMUSCULAR | Status: AC
  Filled 2018-08-09: qty 5

## 2018-08-09 MED ORDER — DEXAMETHASONE SODIUM PHOSPHATE 4 MG/ML IJ SOLN
INTRAMUSCULAR | Status: DC | PRN
  Administered 2018-08-09: 10 mg via INTRAVENOUS

## 2018-08-09 MED ORDER — EPINEPHRINE PF 1 MG/10ML IJ SOSY
20.0000 ug | PREFILLED_SYRINGE | Freq: Once | INTRAMUSCULAR | Status: AC
  Administered 2018-08-09: 20 ug via INTRAVENOUS

## 2018-08-09 MED ORDER — LACTATED RINGERS IV SOLN
INTRAVENOUS | Status: DC
  Administered 2018-08-09: 12:00:00 via INTRAVENOUS

## 2018-08-09 MED ORDER — FENTANYL CITRATE (PF) 100 MCG/2ML IJ SOLN
INTRAMUSCULAR | Status: DC | PRN
  Administered 2018-08-09 (×2): 50 ug via INTRAVENOUS
  Administered 2018-08-09: 100 ug via INTRAVENOUS

## 2018-08-09 MED ORDER — DIPHENHYDRAMINE HCL 50 MG/ML IJ SOLN
INTRAMUSCULAR | Status: DC | PRN
  Administered 2018-08-09: 25 mg via INTRAVENOUS

## 2018-08-09 MED ORDER — HYDROMORPHONE HCL 1 MG/ML IJ SOLN: 0.5000 mg | INTRAMUSCULAR | Status: DC | PRN

## 2018-08-09 MED ORDER — ACETAMINOPHEN 325 MG PO TABS: 650.0000 mg | ORAL_TABLET | ORAL | Status: DC | PRN

## 2018-08-09 MED ORDER — SUGAMMADEX SODIUM 200 MG/2ML IV SOLN
INTRAVENOUS | Status: DC | PRN
  Administered 2018-08-09: 200 mg via INTRAVENOUS

## 2018-08-09 MED ORDER — ALBUMIN HUMAN 5 % IV SOLN
INTRAVENOUS | Status: DC | PRN
  Administered 2018-08-09: 16:00:00 via INTRAVENOUS

## 2018-08-09 MED ORDER — BUPIVACAINE HCL (PF) 0.25 % IJ SOLN
INTRAMUSCULAR | Status: AC
  Filled 2018-08-09: qty 30

## 2018-08-09 MED ORDER — MENTHOL 3 MG MT LOZG
1.0000 | LOZENGE | OROMUCOSAL | Status: DC | PRN
  Filled 2018-08-09: qty 9

## 2018-08-09 MED ORDER — ROCURONIUM BROMIDE 50 MG/5ML IV SOSY
PREFILLED_SYRINGE | INTRAVENOUS | Status: AC
  Filled 2018-08-09: qty 5

## 2018-08-09 MED ORDER — PROPOFOL 10 MG/ML IV BOLUS
INTRAVENOUS | Status: DC | PRN
  Administered 2018-08-09: 140 mg via INTRAVENOUS

## 2018-08-09 MED ORDER — SODIUM CHLORIDE 0.9 % IV SOLN: 250.0000 mL | INTRAVENOUS | Status: DC

## 2018-08-09 MED ORDER — 0.9 % SODIUM CHLORIDE (POUR BTL) OPTIME
TOPICAL | Status: DC | PRN
  Administered 2018-08-09: 1000 mL

## 2018-08-09 MED ORDER — LIDOCAINE 2% (20 MG/ML) 5 ML SYRINGE
INTRAMUSCULAR | Status: DC | PRN
  Administered 2018-08-09: 100 mg via INTRAVENOUS

## 2018-08-09 MED ORDER — SCOPOLAMINE 1 MG/3DAYS TD PT72
1.0000 | MEDICATED_PATCH | TRANSDERMAL | Status: DC
  Administered 2018-08-09: 1.5 mg via TRANSDERMAL

## 2018-08-09 MED ORDER — SODIUM CHLORIDE 0.9 % IV SOLN
INTRAVENOUS | Status: DC | PRN
  Administered 2018-08-09: 40 ug/min via INTRAVENOUS

## 2018-08-09 MED ORDER — ONDANSETRON HCL 4 MG PO TABS: 4.0000 mg | ORAL_TABLET | Freq: Four times a day (QID) | ORAL | Status: DC | PRN

## 2018-08-09 MED ORDER — SENNA 8.6 MG PO TABS
1.0000 | ORAL_TABLET | Freq: Two times a day (BID) | ORAL | Status: DC
  Administered 2018-08-09 – 2018-08-11 (×4): 8.6 mg via ORAL
  Filled 2018-08-09 (×4): qty 1

## 2018-08-09 MED ORDER — DEXAMETHASONE SODIUM PHOSPHATE 10 MG/ML IJ SOLN
INTRAMUSCULAR | Status: AC
  Filled 2018-08-09: qty 1

## 2018-08-09 MED ORDER — CEFAZOLIN SODIUM-DEXTROSE 2-4 GM/100ML-% IV SOLN
2.0000 g | Freq: Three times a day (TID) | INTRAVENOUS | Status: AC
  Administered 2018-08-09 – 2018-08-10 (×2): 2 g via INTRAVENOUS
  Filled 2018-08-09 (×2): qty 100

## 2018-08-09 MED ORDER — HEMOSTATIC AGENTS (NO CHARGE) OPTIME
TOPICAL | Status: DC | PRN
  Administered 2018-08-09: 1 via TOPICAL

## 2018-08-09 MED ORDER — LACTATED RINGERS IV SOLN
INTRAVENOUS | Status: DC | PRN
  Administered 2018-08-09 (×3): via INTRAVENOUS

## 2018-08-09 MED ORDER — VANCOMYCIN HCL 1000 MG IV SOLR
INTRAVENOUS | Status: DC | PRN
  Administered 2018-08-09: 1000 mg

## 2018-08-09 MED ORDER — ONDANSETRON HCL 4 MG/2ML IJ SOLN: 4.0000 mg | Freq: Four times a day (QID) | INTRAMUSCULAR | Status: DC | PRN

## 2018-08-09 MED ORDER — THROMBIN (RECOMBINANT) 20000 UNITS EX SOLR
CUTANEOUS | Status: AC
  Filled 2018-08-09: qty 20000

## 2018-08-09 MED ORDER — SODIUM CHLORIDE 0.9% FLUSH: 3.0000 mL | INTRAVENOUS | Status: DC | PRN

## 2018-08-09 MED ORDER — GLYCOPYRROLATE PF 0.2 MG/ML IJ SOSY
PREFILLED_SYRINGE | INTRAMUSCULAR | Status: AC
  Filled 2018-08-09: qty 1

## 2018-08-09 MED ORDER — THROMBIN 20000 UNITS EX SOLR
CUTANEOUS | Status: DC | PRN
  Administered 2018-08-09: 14:00:00 via TOPICAL

## 2018-08-09 MED ORDER — GLYCOPYRROLATE 0.2 MG/ML IJ SOLN
INTRAMUSCULAR | Status: DC | PRN
  Administered 2018-08-09: 0.1 mg via INTRAVENOUS

## 2018-08-09 MED ORDER — PHENYLEPHRINE 40 MCG/ML (10ML) SYRINGE FOR IV PUSH (FOR BLOOD PRESSURE SUPPORT)
PREFILLED_SYRINGE | INTRAVENOUS | Status: DC | PRN
  Administered 2018-08-09 (×5): 80 ug via INTRAVENOUS

## 2018-08-09 MED ORDER — SODIUM CHLORIDE 0.9% FLUSH
3.0000 mL | Freq: Two times a day (BID) | INTRAVENOUS | Status: DC
  Administered 2018-08-10 – 2018-08-11 (×2): 3 mL via INTRAVENOUS

## 2018-08-09 MED ORDER — ROCURONIUM BROMIDE 100 MG/10ML IV SOLN
INTRAVENOUS | Status: DC | PRN
  Administered 2018-08-09: 30 mg via INTRAVENOUS
  Administered 2018-08-09: 10 mg via INTRAVENOUS
  Administered 2018-08-09: 20 mg via INTRAVENOUS
  Administered 2018-08-09: 50 mg via INTRAVENOUS
  Administered 2018-08-09: 20 mg via INTRAVENOUS

## 2018-08-09 MED ORDER — EPHEDRINE 5 MG/ML INJ
INTRAVENOUS | Status: AC
  Filled 2018-08-09: qty 10

## 2018-08-09 MED ORDER — OXYCODONE HCL 5 MG PO TABS
5.0000 mg | ORAL_TABLET | ORAL | Status: DC | PRN
  Administered 2018-08-09 – 2018-08-11 (×5): 5 mg via ORAL
  Filled 2018-08-09 (×4): qty 1

## 2018-08-09 MED ORDER — DEXAMETHASONE SODIUM PHOSPHATE 10 MG/ML IJ SOLN
10.0000 mg | INTRAMUSCULAR | Status: DC
  Filled 2018-08-09: qty 1

## 2018-08-09 MED ORDER — BUPIVACAINE HCL (PF) 0.25 % IJ SOLN
INTRAMUSCULAR | Status: DC | PRN
  Administered 2018-08-09: 5 mL
  Administered 2018-08-09: 10 mL

## 2018-08-09 MED ORDER — SODIUM CHLORIDE 0.9 % IV SOLN
INTRAVENOUS | Status: DC | PRN
  Administered 2018-08-09: 14:00:00

## 2018-08-09 MED ORDER — METHOCARBAMOL 500 MG PO TABS
500.0000 mg | ORAL_TABLET | Freq: Four times a day (QID) | ORAL | Status: DC | PRN
  Administered 2018-08-10: 500 mg via ORAL
  Filled 2018-08-09 (×2): qty 1

## 2018-08-09 SURGICAL SUPPLY — 71 items
BAG DECANTER FOR FLEXI CONT (MISCELLANEOUS) ×3 IMPLANT
BASKET BONE COLLECTION (BASKET) ×3 IMPLANT
BENZOIN TINCTURE PRP APPL 2/3 (GAUZE/BANDAGES/DRESSINGS) ×3 IMPLANT
BLADE CLIPPER SURG (BLADE) IMPLANT
BONE CANC CHIPS 40CC CAN1/2 (Bone Implant) ×3 IMPLANT
BUR MATCHSTICK NEURO 3.0 LAGG (BURR) ×3 IMPLANT
CAGE COROENT 10X9X28-8 LUMBAR (Cage) ×6 IMPLANT
CANISTER SUCT 3000ML PPV (MISCELLANEOUS) ×3 IMPLANT
CARTRIDGE OIL MAESTRO DRILL (MISCELLANEOUS) ×1 IMPLANT
CHIPS CANC BONE 40CC CAN1/2 (Bone Implant) ×1 IMPLANT
CLOSURE WOUND 1/2 X4 (GAUZE/BANDAGES/DRESSINGS) ×1
CONT SPEC 4OZ CLIKSEAL STRL BL (MISCELLANEOUS) ×3 IMPLANT
COVER BACK TABLE 24X17X13 BIG (DRAPES) IMPLANT
COVER BACK TABLE 60X90IN (DRAPES) ×3 IMPLANT
DERMABOND ADVANCED (GAUZE/BANDAGES/DRESSINGS) ×2
DERMABOND ADVANCED .7 DNX12 (GAUZE/BANDAGES/DRESSINGS) ×1 IMPLANT
DIFFUSER DRILL AIR PNEUMATIC (MISCELLANEOUS) ×3 IMPLANT
DRAPE C-ARM 42X72 X-RAY (DRAPES) ×3 IMPLANT
DRAPE C-ARMOR (DRAPES) ×3 IMPLANT
DRAPE LAPAROTOMY 100X72X124 (DRAPES) ×3 IMPLANT
DRAPE POUCH INSTRU U-SHP 10X18 (DRAPES) ×3 IMPLANT
DRAPE SURG 17X23 STRL (DRAPES) ×3 IMPLANT
DRSG OPSITE 4X5.5 SM (GAUZE/BANDAGES/DRESSINGS) ×3 IMPLANT
DRSG OPSITE POSTOP 4X8 (GAUZE/BANDAGES/DRESSINGS) ×3 IMPLANT
DURAPREP 26ML APPLICATOR (WOUND CARE) ×3 IMPLANT
ELECT REM PT RETURN 9FT ADLT (ELECTROSURGICAL) ×3
ELECTRODE REM PT RTRN 9FT ADLT (ELECTROSURGICAL) ×1 IMPLANT
EVACUATOR 1/8 PVC DRAIN (DRAIN) ×3 IMPLANT
FLOSEAL 5ML (HEMOSTASIS) ×3 IMPLANT
GAUZE 4X4 16PLY RFD (DISPOSABLE) IMPLANT
GLOVE BIO SURGEON STRL SZ7 (GLOVE) ×3 IMPLANT
GLOVE BIO SURGEON STRL SZ8 (GLOVE) ×6 IMPLANT
GLOVE BIOGEL PI IND STRL 7.0 (GLOVE) ×3 IMPLANT
GLOVE BIOGEL PI IND STRL 7.5 (GLOVE) ×2 IMPLANT
GLOVE BIOGEL PI INDICATOR 7.0 (GLOVE) ×6
GLOVE BIOGEL PI INDICATOR 7.5 (GLOVE) ×4
GLOVE SURG SS PI 7.5 STRL IVOR (GLOVE) ×12 IMPLANT
GOWN STRL REUS W/ TWL LRG LVL3 (GOWN DISPOSABLE) ×2 IMPLANT
GOWN STRL REUS W/ TWL XL LVL3 (GOWN DISPOSABLE) ×2 IMPLANT
GOWN STRL REUS W/TWL 2XL LVL3 (GOWN DISPOSABLE) IMPLANT
GOWN STRL REUS W/TWL LRG LVL3 (GOWN DISPOSABLE) ×4
GOWN STRL REUS W/TWL XL LVL3 (GOWN DISPOSABLE) ×4
HEMOSTAT POWDER KIT SURGIFOAM (HEMOSTASIS) IMPLANT
KIT BASIN OR (CUSTOM PROCEDURE TRAY) ×3 IMPLANT
KIT TURNOVER KIT B (KITS) ×3 IMPLANT
MATRIX STRIP NEOCORE 12C (Putty) ×1 IMPLANT
MILL MEDIUM DISP (BLADE) ×3 IMPLANT
NEEDLE HYPO 25X1 1.5 SAFETY (NEEDLE) ×3 IMPLANT
NS IRRIG 1000ML POUR BTL (IV SOLUTION) ×3 IMPLANT
OIL CARTRIDGE MAESTRO DRILL (MISCELLANEOUS) ×3
PACK LAMINECTOMY NEURO (CUSTOM PROCEDURE TRAY) ×3 IMPLANT
PAD ARMBOARD 7.5X6 YLW CONV (MISCELLANEOUS) ×9 IMPLANT
ROD PREBENT 80MM LUMBAR (Rod) ×6 IMPLANT
SCREW 5.0X30 (Screw) ×6 IMPLANT
SCREW LOCK (Screw) ×12 IMPLANT
SCREW LOCK FXNS SPNE MAS PL (Screw) ×6 IMPLANT
SCREW SHANK 5.0X35 (Screw) ×6 IMPLANT
SCREW TULIP 5.5 (Screw) ×6 IMPLANT
SPONGE LAP 4X18 RFD (DISPOSABLE) IMPLANT
SPONGE SURGIFOAM ABS GEL 100 (HEMOSTASIS) ×3 IMPLANT
STRIP CLOSURE SKIN 1/2X4 (GAUZE/BANDAGES/DRESSINGS) ×2 IMPLANT
STRIP MATRIX NEOCORE 12CC (Putty) ×2 IMPLANT
SUT VIC AB 0 CT1 18XCR BRD8 (SUTURE) ×2 IMPLANT
SUT VIC AB 0 CT1 8-18 (SUTURE) ×4
SUT VIC AB 2-0 CP2 18 (SUTURE) ×6 IMPLANT
SUT VIC AB 3-0 SH 8-18 (SUTURE) ×6 IMPLANT
SYR CONTROL 10ML LL (SYRINGE) ×3 IMPLANT
TOWEL GREEN STERILE (TOWEL DISPOSABLE) ×3 IMPLANT
TOWEL GREEN STERILE FF (TOWEL DISPOSABLE) ×3 IMPLANT
TRAY FOLEY MTR SLVR 16FR STAT (SET/KITS/TRAYS/PACK) ×3 IMPLANT
WATER STERILE IRR 1000ML POUR (IV SOLUTION) ×3 IMPLANT

## 2018-08-09 NOTE — Anesthesia Postprocedure Evaluation (Addendum)
Anesthesia Post Note  Patient: Jay Tran  Procedure(s) Performed: Posterior Lumbar Interbody Fusion - Lumbar One-Two POSTEROLATERAL FUSION Lumbar Two-Three, removal of Nuvasive instrumentation Lumbar Four-Five (N/A Back)     Patient location during evaluation: PACU Anesthesia Type: General Level of consciousness: awake Pain management: pain level controlled Vital Signs Assessment: post-procedure vital signs reviewed and stable Respiratory status: spontaneous breathing, nonlabored ventilation, respiratory function stable and patient connected to nasal cannula oxygen Cardiovascular status: stable Postop Assessment: no apparent nausea or vomiting Anesthetic complications: no Comments: Patient noted to have full body hives after removal of surgical drapes. Given benadryl by CRNA, see record/note. In PACU, patient with hypotension. Treated with IV fluid bolus and small boluses of epinephrine, responded well. No complaints of dyspnea, lungs clear.    Last Vitals:  Vitals:   08/09/18 1721 08/09/18 1724  BP: 94/67 100/63  Pulse: 87 84  Resp: 20 18  Temp:    SpO2: 98% 98%    Last Pain:  Vitals:   08/09/18 1638  TempSrc:   PainSc: Solomon Colvin Blatt

## 2018-08-09 NOTE — Transfer of Care (Signed)
Immediate Anesthesia Transfer of Care Note  Patient: Jay Tran  Procedure(s) Performed: Posterior Lumbar Interbody Fusion - Lumbar One-Two POSTEROLATERAL FUSION Lumbar Two-Three, removal of Nuvasive instrumentation Lumbar Four-Five (N/A Back)  Patient Location: PACU  Anesthesia Type:General  Level of Consciousness: awake, drowsy and patient cooperative  Airway & Oxygen Therapy: Patient Spontanous Breathing and Patient connected to face mask oxygen  Post-op Assessment: Report given to RN, Post -op Vital signs reviewed and stable and Patient moving all extremities X 4  Post vital signs: Reviewed and stable  Last Vitals:  Vitals Value Taken Time  BP 100/51 08/09/2018  4:40 PM  Temp 36.7 C 08/09/2018  4:38 PM  Pulse 90 08/09/2018  4:51 PM  Resp 19 08/09/2018  4:51 PM  SpO2 96 % 08/09/2018  4:51 PM  Vitals shown include unvalidated device data.  Last Pain:  Vitals:   08/09/18 1638  TempSrc:   PainSc: Asleep      Patients Stated Pain Goal: 3 (53/74/82 7078)  Complications: No apparent anesthesia complications

## 2018-08-09 NOTE — Op Note (Signed)
08/09/2018  4:31 PM  PATIENT:  Jay Tran  84 y.o. male  PRE-OPERATIVE DIAGNOSIS:  Adjacent level stenosis L1-2 the large disc herniation,probable pseudoarthrosis L2-3, back and leg pain  POST-OPERATIVE DIAGNOSIS:  same  PROCEDURE:   1. Decompressive lumbar laminectomy L1-2 requiring more work than would be required for a simple exposure of the disk for PLIF in order to adequately decompress the neural elements and address the spinal stenosis 2. Posterior lumbar interbody fusion L1-2 using peek interbody cages packed with morcellized allograft and autograft 3. Posterior fixation L1-3 using nuvasive  cortical pedicle screws.  4. Intertransverse arthrodesis L1-3 using morcellized autograft and allograft. 5. Exploration of fusion to confirm pseudoarthrosis 6. ExTraction of instrumentation L2-L5 inclusive  SURGEON:  Sherley Bounds, MD  ASSISTANTS: Glenford Peers FNP  ANESTHESIA:  General  EBL: 400 ml  Total I/O In: 1850 [I.V.:1600; IV Piggyback:250] Out: 945 [Urine:545; Blood:400]  BLOOD ADMINISTERED:none  DRAINS: none   INDICATION FOR PROCEDURE: This patient presented with severe back and leg pain. Imaging revealed and severe spinal stenosis at L1-2 secondary to very large disc herniation above previous L2-L5 fusion. The patient tried a reasonable attempt at conservative medical measures without relief. I recommended decompression and instrumented fusion to address the stenosis as well as the segmental  instability.  Patient understood the risks, benefits, and alternatives and potential outcomes and wished to proceed.  PROCEDURE DETAILS:  The patient was brought to the operating room. After induction of generalized endotracheal anesthesia the patient was rolled into the prone position on chest rolls and all pressure points were padded. The patient's lumbar region was cleaned and then prepped with DuraPrep and draped in the usual sterile fashion. Anesthesia was injected and then a  dorsal midline incision was made and carried down to the lumbosacral fascia. The fascia was opened and the paraspinous musculature was taken down in a subperiosteal fashion to expose L1 and L5 as well as the previously placed instrumentation. The locking caps were removed and the rods were removed. I explored the fusion by pulling on each screw successively and felt that there was movement between the L2 and L3 screws. All the screws had good purchase and I felt no significant movement between the pedicle screws suggesting arthrodesis. I removed the L4 and L5 pedicle screws bilaterally. The L2 pedicle screws did not have good purchase and they were removed bilaterally. The L3 pedicle screws were left in place and had good purchase. A self-retaining retractor was placed. Intraoperative fluoroscopy confirmed my level, and I started with placement of the L1 cortical pedicle screws. The pedicle screw entry zones were identified utilizing surface landmarks and  AP and lateral fluoroscopy. I scored the cortex with the high-speed drill and then used the hand drill to drill an upward and outward direction into the pedicle. I then tapped line to line. I then placed a 5.0 x 35 mm cortical pedicle screw into the pedicles of L1 bilaterally. I then turned my attention to the decompression and complete lumbar laminectomies, hemi- facetectomies, and foraminotomies were performed at L1-2. The patient had significant spinal stenosis and this required more work than would be required for a simple exposure of the disc for posterior lumbar interbody fusion which would only require a limited laminotomy. There was a very large disc herniation at L1-2 on the right at the pedicle level which was removed in multiple pieces. Much more generous decompression and generous foraminotomy was undertaken in order to adequately decompress the neural elements and address  the patient's leg pain. The yellow ligament was removed to expose the underlying  dura and nerve roots, and generous foraminotomies were performed to adequately decompress the neural elements. Both the exiting and traversing nerve roots were decompressed on both sides until a coronary dilator passed easily along the nerve roots. Once the decompression was complete, I turned my attention to the posterior lower lumbar interbody fusion. The epidural venous vasculature was coagulated and cut sharply. Disc space was incised and the initial discectomy was performed with pituitary rongeurs. The disc space was distracted with sequential distractors to a height of 10 mm. We then used a series of scrapers and shavers to prepare the endplates for fusion. The midline was prepared with Epstein curettes. Once the complete discectomy was finished, we packed an 10 x 28 by a 8 interbody cage with local autograft and morcellized allograft, gently retracted the nerve root, and tapped the cage into position at L1-2.  The midline between the cages was packed with morselized autograft and allograft. We then turned our attention to the placement of the L2 pedicle screws. A 5.5 x 30 mm cortical pedicle screws were placed in the old screw holes with good purchase.  We then decorticated the transverse processes from L1-L3 and laid a mixture of morcellized  allograft out over these to perform intertransverse arthrodesis at L1-L3. We then placed lordotic rods into the multiaxial screw heads of the pedicle screws and locked these in position with the locking caps and anti-torque device. We then checked our construct with AP and lateral fluoroscopy. Irrigated with copious amounts of bacitracin-containing saline solution. Inspected the nerve roots once again to assure adequate decompression, lined to the dura with Gelfoam, placed a medium Hemovac drain,placed powdered vancomycin into the wound, and closed the muscle and the fascia with 0 Vicryl. Closed the subcutaneous tissues with 2-0 Vicryl and subcuticular tissues with 3-0  Vicryl. The skin was closed with benzoin and Steri-Strips. Dressing was then applied, the patient was awakened from general anesthesia and transported to the recovery room in stable condition. At the end of the procedure all sponge, needle and instrument counts were correct.   PLAN OF CARE: admit to inpatient  PATIENT DISPOSITION:  PACU - hemodynamically stable.   Delay start of Pharmacological VTE agent (>24hrs) due to surgical blood loss or risk of bleeding:  yes

## 2018-08-09 NOTE — Anesthesia Preprocedure Evaluation (Addendum)
Anesthesia Evaluation  Patient identified by MRN, date of birth, ID band Patient awake    Reviewed: Allergy & Precautions, NPO status , Patient's Chart, lab work & pertinent test results  History of Anesthesia Complications (+) PONV  Airway Mallampati: II  TM Distance: >3 FB     Dental   Pulmonary former smoker,    breath sounds clear to auscultation       Cardiovascular hypertension, + Peripheral Vascular Disease   Rhythm:Regular Rate:Normal     Neuro/Psych    GI/Hepatic Neg liver ROS, GERD  ,  Endo/Other  negative endocrine ROS  Renal/GU negative Renal ROS     Musculoskeletal  (+) Arthritis ,   Abdominal   Peds  Hematology   Anesthesia Other Findings   Reproductive/Obstetrics                             Anesthesia Physical Anesthesia Plan  ASA: III  Anesthesia Plan: General   Post-op Pain Management:    Induction: Intravenous  PONV Risk Score and Plan: Dexamethasone, Midazolam and Ondansetron  Airway Management Planned: Oral ETT  Additional Equipment:   Intra-op Plan:   Post-operative Plan: Extubation in OR  Informed Consent: I have reviewed the patients History and Physical, chart, labs and discussed the procedure including the risks, benefits and alternatives for the proposed anesthesia with the patient or authorized representative who has indicated his/her understanding and acceptance.   Dental advisory given  Plan Discussed with: CRNA and Anesthesiologist  Anesthesia Plan Comments:        Anesthesia Quick Evaluation

## 2018-08-09 NOTE — Progress Notes (Signed)
1700: Dr. Fransisco Beau at bedside for low bp , hives/rash noted to back, chest, arms,legs. Epinephrine given by MD. See MAR.                                                                                     1705: Epinephrine given by Dr. Fransisco Beau. See MAR.               8185: Epinephrine given by Dr. Fransisco Beau. See MAR.                9093: OOB to wheelchair for transport to [facility]. Pt. States that he is not feeling well. Placed back on stretcher. Dr. Ronnald Ramp updated. Patient will go to [facility] instead. Continue to monitor.

## 2018-08-09 NOTE — Anesthesia Procedure Notes (Signed)
Procedure Name: Intubation Date/Time: 08/09/2018 1:34 PM Performed by: Orlie Dakin, CRNA Pre-anesthesia Checklist: Emergency Drugs available, Patient identified, Suction available and Patient being monitored Patient Re-evaluated:Patient Re-evaluated prior to induction Oxygen Delivery Method: Circle system utilized Preoxygenation: Pre-oxygenation with 100% oxygen Induction Type: IV induction Ventilation: Mask ventilation without difficulty Laryngoscope Size: Miller and 3 Grade View: Grade I Tube type: Oral Tube size: 7.5 mm Number of attempts: 1 Airway Equipment and Method: Stylet Placement Confirmation: ETT inserted through vocal cords under direct vision,  positive ETCO2 and breath sounds checked- equal and bilateral Secured at: 23 cm Tube secured with: Tape Dental Injury: Teeth and Oropharynx as per pre-operative assessment

## 2018-08-09 NOTE — H&P (Signed)
Subjective: Patient is a 84 y.o. male admitted for plif. Onset of symptoms was several months ago, rapidly worsening since that time.  The pain is rated intense, unremitting, and is located at the across the lower back and radiates to legs. The pain is described as aching and occurs all day. The symptoms have been progressive. Symptoms are exacerbated by exercise. MRI or CT showed large lumbar disc herniation L1-2 with severe spinal stenosis above previous fusion   Past Medical History:  Diagnosis Date  . Arthritis   . BPH (benign prostatic hypertrophy)   . 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 GERD (gastroesophageal reflux disease)   . Hearing aid worn    Hx: of right ear  . Hyperlipemia   . Hypertension   . Lumbar herniated disc   . Lumbar stenosis   . PONV (postoperative nausea and vomiting)   . Seasonal allergies    Hx: of    Past Surgical History:  Procedure Laterality Date  . COLONOSCOPY     Hx: of  . EYE SURGERY     bil catarcts 02/2016  . HERNIA REPAIR    . LUMBAR LAMINECTOMY  10/26/2012   Procedure: MICRODISCECTOMY LUMBAR LAMINECTOMY;  Surgeon: Marybelle Killings, MD;  Location: [facility];  Service: Orthopedics;  Laterality: Right;  Right L4-5 Microdiscectomy  . MAXIMUM ACCESS (MAS)POSTERIOR LUMBAR INTERBODY FUSION (PLIF) 1 LEVEL N/A 04/15/2015   Procedure: Maximum Access Surgery Posterior Lumbar Interbody Fusion Lumbar two-Lumbar three extension of hardware;  Surgeon: Eustace Moore, MD;  Location: [facility];  Service: Neurosurgery;  Laterality: N/A;  . ROTATOR CUFF REPAIR     right shoulder - 3 times  . TONSILLECTOMY    . TRANSURETHRAL RESECTION OF BLADDER TUMOR N/A 02/24/2017   Procedure: TRANSURETHRAL RESECTION OF BLADDER TUMOR (TURBT);  Surgeon: Alexis Frock, MD;  Location: [facility];  Service: Urology;  Laterality: N/A;    Prior to Admission medications   Medication Sig Start Date End Date Taking? Authorizing Provider  acetaminophen-codeine  (TYLENOL #3) 300-30 MG tablet Take 1 tablet by mouth every 8 (eight) hours as needed for moderate pain. 1-2 tablets   Yes [provider]  finasteride (PROSCAR) 5 MG tablet Take 5 mg by mouth every evening.    Yes [provider]  fluticasone (FLONASE) 50 MCG/ACT nasal spray Place 2 sprays into both nostrils every evening.   Yes [provider]  hydrochlorothiazide (HYDRODIURIL) 50 MG tablet Take 50 mg by mouth daily.    Yes [provider]  Liniments (SALONPAS PAIN RELIEF PATCH EX) Place 1 patch onto the skin daily as needed (for pain.).   Yes [provider]  lisinopril (PRINIVIL,ZESTRIL) 20 MG tablet Take 20 mg by mouth daily.    Yes [provider]  Multiple Vitamins-Minerals (PRESERVISION AREDS 2 PO) Take 1 tablet by mouth 2 (two) times daily.    Yes [provider]  naproxen sodium (ALEVE) 220 MG tablet Take 440 mg by mouth 2 (two) times daily as needed (pain).    Yes [provider]  Omega-3 Fatty Acids (FISH OIL) 1200 MG CAPS Take 1,200 mg by mouth daily.   Yes [provider]  ranitidine (ZANTAC) 150 MG tablet Take 150 mg by mouth daily as needed for heartburn.    Yes [provider]  SYSTANE 0.4-0.3 % SOLN Place 1 drop into both eyes 3 (three) times daily as needed (dry eyes).   Yes [provider]  Tamsulosin HCl (FLOMAX) 0.4 MG CAPS Take 0.4 mg by mouth every evening.    Yes [provider]  tiZANidine (ZANAFLEX) 4 MG tablet 1 po bid-tid prn spasms Patient taking differently: Take 4 mg by mouth 3 (three) times daily as needed for muscle spasms.  12/14/17  Yes Mcarthur Rossetti, MD  methylPREDNISolone (MEDROL) 4 MG tablet Take as directed Patient not taking: Reported on 07/31/2018 12/21/17   Pete Pelt, PA-C   No Active Allergies  Social History   Tobacco Use  . Smoking status: Former Smoker    Last attempt to quit: 11/12/1963    Years since quitting: 54.7  .  Smokeless tobacco: Never Used  Substance Use Topics  . Alcohol use: No    Family History  Problem Relation Age of Onset  . Stroke Mother   . Cancer - Prostate Father      Review of Systems  Positive ROS: neg  All other systems have been reviewed and were otherwise negative with the exception of those mentioned in the HPI and as above.  Objective: Vital signs in last 24 hours: Temp:  [97.9 F (36.6 C)] 97.9 F (36.6 C) (08/22 1115) Pulse Rate:  [70] 70 (08/22 1115) Resp:  [18] 18 (08/22 1115) BP: (165)/(75) 165/75 (08/22 1115) SpO2:  [97 %] 97 % (08/22 1115) Weight:  [90.5 kg] 90.5 kg (08/22 1115)  General Appearance: Alert, cooperative, no distress, appears stated age Head: Normocephalic, without obvious abnormality, atraumatic Eyes: PERRL, conjunctiva/corneas clear, EOM's intact    Neck: Supple, symmetrical, trachea midline Back: Symmetric, no curvature, ROM normal, no CVA tenderness Lungs:  respirations unlabored Heart: Regular rate and rhythm Abdomen: Soft, non-tender Extremities: Extremities normal, atraumatic, no cyanosis or edema Pulses: 2+ and symmetric all extremities Skin: Skin color, texture, turgor normal, no rashes or lesions  NEUROLOGIC:   Mental status: Alert and oriented x4,  no aphasia, good attention span, fund of knowledge, and memory Motor Exam - grossly normal Sensory Exam - grossly normal Reflexes: 1+ Coordination - grossly normal Gait - grossly normal Balance - grossly normal Cranial Nerves: I: smell Not tested  II: visual acuity  OS: nl    OD: nl  II: visual fields Full to confrontation  II: pupils Equal, round, reactive to light  III,VII: ptosis None  III,IV,VI: extraocular muscles  Full ROM  V: mastication Normal  V: facial light touch sensation  Normal  V,VII: corneal reflex  Present  VII: facial muscle function - upper  Normal  VII: facial muscle function - lower Normal  VIII: hearing Not tested  IX: soft palate elevation  Normal   IX,X: gag reflex Present  XI: trapezius strength  5/5  XI: sternocleidomastoid strength 5/5  XI: neck flexion strength  5/5  XII: tongue strength  Normal    Data Review Lab Results  Component Value Date   WBC 9.3 08/03/2018   HGB 14.7 08/03/2018   HCT 44.8 08/03/2018   MCV 98.7 08/03/2018   PLT 249 08/03/2018   Lab Results  Component Value Date   NA 136 08/03/2018   K 3.7 08/03/2018   CL 100 08/03/2018   CO2 28 08/03/2018   BUN 12 08/03/2018   CREATININE 0.94 08/03/2018   GLUCOSE 105 (H) 08/03/2018   Lab Results  Component Value Date   INR 1.02 08/03/2018    Assessment/Plan:  Estimated body mass index is 28.63 kg/m as calculated from the following:   Height as of this encounter: 5'  10" (1.778 m).   Weight as of this encounter: 90.5 kg. Patient admitted for L1-2 plif. Patient has failed a reasonable attempt at conservative therapy.  I explained the condition and procedure to the patient and answered any questions.  Patient wishes to proceed with procedure as planned. Understands risks/ benefits and typical outcomes of procedure.   Brileigh Sevcik S 08/09/2018 12:54 PM

## 2018-08-10 ENCOUNTER — Other Ambulatory Visit: Payer: Self-pay

## 2018-08-10 MED FILL — Thrombin (Recombinant) For Soln 20000 Unit: CUTANEOUS | Qty: 1 | Status: AC

## 2018-08-10 NOTE — Evaluation (Signed)
Occupational Therapy Evaluation Patient Details Name: Jay Tran MRN: [id number] DOB: 05/08/1934 Today's Date: 08/10/2018    History of Present Illness pt is an 84 y/o male with pmh significant for DVT, HTN, multiple lumbar surgeries, admitted with worsening radiating pain.  pt s/p decompressive lami at L12, PLIF L12, posterior fixation at L 1-3.   Clinical Impression   PTA patient independent.  Currently, patient requires setup assist for UB ADL, mod assist for LB ADL, toilet transfers with min guard assist, toileting with min assist, and bed mobility with min guard assist.  Educated patient and spouse on precautions, brace management and wear schedule, ADL compensatory techniques, mobility, safety, DME and recommendations.  Patient presents with decreased safety awareness, decreased short term memory, and decreased problem solving during session; spouse reports some confusion noted (different from baseline) but believes it may be medication related; therapist informed RN.  Based on performance today, patient will benefit from continued OT services while admitted in order maximize safety, precaution adherance and independence.  He will have 24/7 support from spouse at home, who is able to assist with needs, and therefore anticipate no further OT needs at dc.  Will continue to follow.    Follow Up Recommendations  No OT follow up;Supervision/Assistance - 24 hour    Equipment Recommendations  Tub/shower seat    Recommendations for Other Services       Precautions / Restrictions Precautions Precautions: Back Precaution Booklet Issued: Yes (comment) Precaution Comments: reviewed precautions with patient and spouse, pt able to recall 1/3 at completion of session Required Braces or Orthoses: Spinal Brace Spinal Brace: Lumbar corset;Applied in sitting position Restrictions Weight Bearing Restrictions: No      Mobility Bed Mobility Overal bed mobility: Needs Assistance Bed Mobility:  Rolling;Sidelying to Sit;Sit to Sidelying Rolling: Min guard Sidelying to sit: Min guard     Sit to sidelying: Min guard General bed mobility comments: min guard for safety, cueing to recall and complete proper log rolling technique   Transfers Overall transfer level: Needs assistance Equipment used: Rolling walker (2 wheeled) Transfers: Sit to/from Stand Sit to Stand: Min guard         General transfer comment: cueing for hand placement and technique, min guard for safety    Balance Overall balance assessment: Needs assistance Sitting-balance support: No upper extremity supported;Feet supported Sitting balance-Leahy Scale: Good     Standing balance support: No upper extremity supported;During functional activity Standing balance-Leahy Scale: Fair Standing balance comment: min guard for safety and balance during 0 hand support grooming tasks within BOS                           ADL either performed or assessed with clinical judgement   ADL Overall ADL's : Needs assistance/impaired Eating/Feeding: Set up;Sitting   Grooming: Min guard;Standing;Cueing for safety;Cueing for compensatory techniques Grooming Details (indicate cue type and reason): cueing for compensatory techniques for back precautions  Upper Body Bathing: Set up;Sitting   Lower Body Bathing: Moderate assistance;Sit to/from stand;Cueing for compensatory techniques;Cueing for back precautions Lower Body Bathing Details (indicate cue type and reason): requires assistance for B feet and buttocks, limited reach and unable to complete figure 4 technique at this time  Upper Body Dressing : Set up;Sitting   Lower Body Dressing: Moderate assistance;Sit to/from stand;Cueing for back precautions;Cueing for compensatory techniques Lower Body Dressing Details (indicate cue type and reason): unable to complete figure 4 technique, spouse planning to assist with LB  dressing  Toilet Transfer: Min  guard;Ambulation;BSC;RW Toilet Transfer Details (indicate cue type and reason): min guard for safety and balance, decreased safety awareness with mobility due to IV pole Toileting- Clothing Manipulation and Hygiene: Supervision/safety;Sit to/from stand       Functional mobility during ADLs: Min guard;Rolling walker;Cueing for safety General ADL Comments: completed bed mobility, transfers, in room mobility and ADLs; educated on compensatory techniques for ADLs to adhere to back precautions; cueing throughout session with poor adherance,     Vision Baseline Vision/History: Wears glasses Wears Glasses: At all times Patient Visual Report: No change from baseline Vision Assessment?: No apparent visual deficits     Perception     Praxis      Pertinent Vitals/Pain Pain Assessment: Faces Pain Score: 3  Faces Pain Scale: Hurts a little bit Pain Location: lower back incision Pain Descriptors / Indicators: Aching;Discomfort Pain Intervention(s): Monitored during session;Repositioned     Hand Dominance Right   Extremity/Trunk Assessment Upper Extremity Assessment Upper Extremity Assessment: Overall WFL for tasks assessed   Lower Extremity Assessment Lower Extremity Assessment: Defer to PT evaluation   Cervical / Trunk Assessment Cervical / Trunk Assessment: Other exceptions Cervical / Trunk Exceptions: s/p lumbar sx    Communication Communication Communication: HOH   Cognition Arousal/Alertness: Awake/alert Behavior During Therapy: WFL for tasks assessed/performed Overall Cognitive Status: Impaired/Different from baseline Area of Impairment: Attention;Memory;Following commands;Safety/judgement;Awareness;Problem solving                   Current Attention Level: Sustained Memory: Decreased recall of precautions;Decreased short-term memory Following Commands: Follows one step commands inconsistently;Follows one step commands with increased time Safety/Judgement:  Decreased awareness of safety Awareness: Emergent Problem Solving: Slow processing;Difficulty sequencing;Requires verbal cues General Comments: increased time to process, perseverating on "needing to pee", poor recall of back precautions and difficulty sequencing    General Comments  spouse present and supportie    Exercises     Shoulder Instructions      Home Living Family/patient expects to be discharged to:: Private residence Living Arrangements: Spouse/significant other Available Help at Discharge: Family;Available 24 hours/day Type of Home: House Home Access: Stairs to enter [employer] of Steps: 4 Entrance Stairs-Rails: Left Home Layout: Two level;Laundry or work area in basement;Able to live on main level with bedroom/bathroom     Bathroom Shower/Tub: Automotive engineer)   Biochemist, clinical: [place]: Environmental consultant - 2 wheels;Bedside commode          Prior Functioning/Environment Level of Independence: Independent        Comments: independent ADLs, IADLs, mobility, drives        OT Problem List: Decreased activity tolerance;Impaired balance (sitting and/or standing);Decreased safety awareness;Decreased knowledge of use of DME or AE;Decreased knowledge of precautions;Pain      OT Treatment/Interventions: Self-care/ADL training;DME and/or AE instruction;Therapeutic activities;Patient/family education;Balance training    OT Goals(Current goals can be found in the care plan section) Acute Rehab OT Goals Patient Stated Goal: to get home and to pee  OT Goal Formulation: With patient/family Time For Goal Achievement: 08/24/18 Potential to Achieve Goals: Good  OT Frequency: Min 2X/week   Barriers to D/C:            Co-evaluation              AM-PAC PT "6 Clicks" Daily Activity     Outcome Measure Help from another person eating meals?: None Help from another person taking care of personal grooming?: A  Little  Help from another person toileting, which includes using toliet, bedpan, or urinal?: A Little Help from another person bathing (including washing, rinsing, drying)?: A Lot Help from another person to put on and taking off regular upper body clothing?: A Little Help from another person to put on and taking off regular lower body clothing?: A Lot 6 Click Score: 17   End of Session Equipment Utilized During Treatment: Gait belt;Rolling walker;Back brace Nurse Communication: Mobility status;Other (comment)(confusion/memory deficits)  Activity Tolerance: Patient tolerated treatment well Patient left: in bed;with call bell/phone within reach;with family/visitor present  OT Visit Diagnosis: Other abnormalities of gait and mobility (R26.89);Pain;Other symptoms and signs involving cognitive function Pain - part of body: (back)                Time: 1342-1420 OT Time Calculation (min): 38 min Charges:  OT General Charges $OT Visit: 1 Visit OT Evaluation $OT Eval Moderate Complexity: 1 Mod OT Treatments $Self Care/Home Management : 8-22 mins  Delight Stare, OTR/L  Pager Dawn 08/10/2018, 5:18 PM

## 2018-08-10 NOTE — Progress Notes (Signed)
Patient ID: Jay Tran, male   DOB: 1934-01-09, 84 y.o.   MRN: [id number] Subjective: Patient reports some soreness with movement, OOB to chair at present, brief episode of R leg tingling this am  Objective: Vital signs in last 24 hours: Temp:  [97.5 F (36.4 C)-98.7 F (37.1 C)] 98.7 F (37.1 C) (08/23 0400) Pulse Rate:  [70-106] 72 (08/23 0400) Resp:  [0-22] 15 (08/23 0400) BP: (71-165)/(44-97) 137/60 (08/23 0400) SpO2:  [91 %-99 %] 96 % (08/23 0400) Weight:  [90.5 kg-92.5 kg] 92.5 kg (08/22 2030)  Intake/Output from previous day: 08/22 0701 - 08/23 0700 In: 3130 [I.V.:2250; IV Piggyback:250] Out: 5573 [Urine:1095; Drains:270; Blood:400] Intake/Output this shift: No intake/output data recorded.  Neurologic: Grossly normal  Lab Results: Lab Results  Component Value Date   WBC 9.3 08/03/2018   HGB 14.7 08/03/2018   HCT 44.8 08/03/2018   MCV 98.7 08/03/2018   PLT 249 08/03/2018   Lab Results  Component Value Date   INR 1.02 08/03/2018   BMET Lab Results  Component Value Date   NA 136 08/03/2018   K 3.7 08/03/2018   CL 100 08/03/2018   CO2 28 08/03/2018   GLUCOSE 105 (H) 08/03/2018   BUN 12 08/03/2018   CREATININE 0.94 08/03/2018   CALCIUM 9.5 08/03/2018    Studies/Results: Chest 2 View  Result Date: 08/09/2018 CLINICAL DATA:  Spinal stenosis preop EXAM: CHEST - 2 VIEW COMPARISON:  04/08/2015 FINDINGS: The heart size and mediastinal contours are within normal limits. Both lungs are clear. The visualized skeletal structures are unremarkable. Mild apical scarring bilaterally IMPRESSION: No active cardiopulmonary disease. Electronically Signed   By: Franchot Gallo M.D.   On: 08/09/2018 11:54   Dg Lumbar Spine 2-3 Views  Result Date: 08/09/2018 CLINICAL DATA:  Lumbar fusion. EXAM: DG C-ARM 61-120 MIN; LUMBAR SPINE - 2-3 VIEW COMPARISON:  Films earlier this day. FINDINGS: Limited spot views of the thoracolumbar spine are submitted postoperatively for  interpretation. Posterior rod/bipedicular screw and interbody fusion material identified of what appears to be L1-L2-L3. No gross complicating features noted. IMPRESSION: Posterior/interbody fusion at what appears to be L1-L2-L3. Electronically Signed   By: Margarette Canada M.D.   On: 08/09/2018 16:15   Dg C-arm 1-60 Min  Result Date: 08/09/2018 CLINICAL DATA:  Lumbar fusion. EXAM: DG C-ARM 61-120 MIN; LUMBAR SPINE - 2-3 VIEW COMPARISON:  Films earlier this day. FINDINGS: Limited spot views of the thoracolumbar spine are submitted postoperatively for interpretation. Posterior rod/bipedicular screw and interbody fusion material identified of what appears to be L1-L2-L3. No gross complicating features noted. IMPRESSION: Posterior/interbody fusion at what appears to be L1-L2-L3. Electronically Signed   By: Margarette Canada M.D.   On: 08/09/2018 16:15    Assessment/Plan: mobilize today  Estimated body mass index is 29.26 kg/m as calculated from the following:   Height as of this encounter: 5\' 10"  (1.778 m).   Weight as of this encounter: 92.5 kg.    LOS: 1 day    Celeste Candelas S 08/10/2018, 8:51 AM

## 2018-08-10 NOTE — Evaluation (Signed)
Physical Therapy Evaluation Patient Details Name: Jay Tran MRN: [id number] DOB: [birth year] Today's Date: 08/10/2018   History of Present Illness  pt is an 84 y/o male with pmh significant for DVT, HTN, multiple lumbar surgeries, admitted with worsening radiating pain.  pt s/p decompressive lami at L12, PLIF L12, posterior fixation at L 1-3.  Clinical Impression  Pt is at or close to baseline functioning and should be safe at home with wife's assist. There are no further acute PT needs.  Will sign off at this time.     Follow Up Recommendations No PT follow up;Supervision/Assistance - 24 hour    Equipment Recommendations  None recommended by PT    Recommendations for Other Services       Precautions / Restrictions Precautions Precautions: Back      Mobility  Bed Mobility Overal bed mobility: Needs Assistance Bed Mobility: Rolling;Sidelying to Sit;Sit to Sidelying Rolling: Min guard Sidelying to sit: Min guard     Sit to sidelying: Min guard General bed mobility comments: demonstrated safe technique and pt practiced untilno assist needed.  Transfers Overall transfer level: Needs assistance Equipment used: Rolling walker (2 wheeled) Transfers: Sit to/from Stand Sit to Stand: Min guard         General transfer comment: cues for technqiue and no assist needed  Ambulation/Gait Ambulation/Gait assistance: Supervision Gait Distance (Feet): 400 Feet Assistive device: Rolling walker (2 wheeled) Gait Pattern/deviations: Step-through pattern Gait velocity: moderate speed Gait velocity interpretation: 1.31 - 2.62 ft/sec, indicative of limited community ambulator General Gait Details: generally steady, cues for postural checks  Stairs Stairs: Yes Stairs assistance: Min guard Stair Management: One rail Left;Alternating pattern;Step to pattern;Forwards Number of Stairs: 5 General stair comments: safe with rail  Wheelchair Mobility    Modified Rankin (Stroke  Patients Only)       Balance Overall balance assessment: Needs assistance   Sitting balance-Leahy Scale: Good     Standing balance support: No upper extremity supported;Bilateral upper extremity supported Standing balance-Leahy Scale: Fair                               Pertinent Vitals/Pain Pain Assessment: 0-10 Pain Score: 3  Pain Location: incisional Pain Descriptors / Indicators: Burning;Aching Pain Intervention(s): Monitored during session    Home Living Family/patient expects to be discharged to:: Private residence Living Arrangements: Spouse/significant other Available Help at Discharge: Family;Available 24 hours/day Type of Home: House Home Access: Stairs to enter Entrance Stairs-Rails: Left Entrance Stairs-Number of Steps: 4 Home Layout: Two level;Laundry or work area in basement;Able to live on main level with bedroom/bathroom Home Equipment: Environmental consultant - 2 wheels;Bedside commode;Shower seat      Prior Function Level of Independence: Independent         Comments: drives, runs errands,      Hand Dominance        Extremity/Trunk Assessment   Upper Extremity Assessment Upper Extremity Assessment: Overall WFL for tasks assessed    Lower Extremity Assessment Lower Extremity Assessment: Overall WFL for tasks assessed       Communication   Communication: No difficulties  Cognition Arousal/Alertness: Awake/alert Behavior During Therapy: WFL for tasks assessed/performed Overall Cognitive Status: Within Functional Limits for tasks assessed                                        General  Comments General comments (skin integrity, edema, etc.): instructed/reinforced back care/prec. to pt/wife, including bed mobility/transitions to sit, bracing issues, lifting restrictions, progression of activity.    Exercises     Assessment/Plan    PT Assessment Patent does not need any further PT services  PT Problem List Decreased  activity tolerance;Decreased mobility;Decreased knowledge of use of DME;Decreased safety awareness;Decreased knowledge of precautions;Pain       PT Treatment Interventions      PT Goals (Current goals can be found in the Care Plan section)  Acute Rehab PT Goals PT Goal Formulation: All assessment and education complete, DC therapy Potential to Achieve Goals: Good    Frequency     Barriers to discharge        Co-evaluation               AM-PAC PT "6 Clicks" Daily Activity  Outcome Measure Difficulty turning over in bed (including adjusting bedclothes, sheets and blankets)?: A Little Difficulty moving from lying on back to sitting on the side of the bed? : A Little Difficulty sitting down on and standing up from a chair with arms (e.g., wheelchair, bedside commode, etc,.)?: A Little Help needed moving to and from a bed to chair (including a wheelchair)?: A Little Help needed walking in hospital room?: A Little Help needed climbing 3-5 steps with a railing? : A Little 6 Click Score: 18    End of Session Equipment Utilized During Treatment: Back brace Activity Tolerance: Patient tolerated treatment well Patient left: in chair;with call bell/phone within reach;with family/visitor present Nurse Communication: Mobility status PT Visit Diagnosis: Other abnormalities of gait and mobility (R26.89);Pain Pain - part of body: (back)    Time: 0156-1537 PT Time Calculation (min) (ACUTE ONLY): 32 min   Charges:   PT Evaluation $PT Eval Low Complexity: 1 Low PT Treatments $Gait Training: 8-22 mins        08/10/2018  Donnella Sham, PT [phone]  (pager)  Tessie Fass Willard Farquharson 08/10/2018, 3:46 PM

## 2018-08-11 MED ORDER — TIZANIDINE HCL 4 MG PO TABS: 4.0000 mg | ORAL_TABLET | Freq: Three times a day (TID) | ORAL | 1 refills | Status: AC | PRN

## 2018-08-11 MED ORDER — OXYCODONE HCL 5 MG PO TABS: 5.0000 mg | ORAL_TABLET | Freq: Four times a day (QID) | ORAL | 0 refills | Status: DC | PRN

## 2018-08-11 NOTE — Care Management Note (Signed)
Case Management Note  Patient Details  Name: Jay Tran MRN: [id number] Date of Birth: 1934-08-30  Subjective/Objective:                    Action/Plan:  Spoke w patient's wife. They decline DME, state they already have it at home. No other CM needs identified.   Expected Discharge Date:  08/11/18               Expected Discharge Plan:  Home/Self Care  In-House Referral:     Discharge planning Services  CM Consult  Post Acute Care Choice:    Choice offered to:     DME Arranged:    DME Agency:     HH Arranged:    HH Agency:     Status of Service:  Completed, signed off  If discussed at [employer] of Stay Meetings, dates discussed:    Additional Comments:  Carles Collet, RN 08/11/2018, 8:54 AM

## 2018-08-11 NOTE — Progress Notes (Signed)
Pt's hemovac had an output of 100 mL of bloody drainage overnight.

## 2018-08-11 NOTE — Discharge Summary (Signed)
Physician Discharge Summary  Patient ID: Jay Tran MRN: [id number] DOB/AGE: 07/20/1934 84 y.o.  Admit date: 08/09/2018 Discharge date: 08/11/2018  Admission Diagnoses: HNP with stenosis/ pseudoarthrosis    Discharge Diagnoses: same   Discharged Condition: good  Hospital Course: The patient was admitted on 08/09/2018 and taken to the operating room where the patient underwent PLIF L1-2. The patient tolerated the procedure well and was taken to the recovery room and then to the floor in stable condition. The hospital course was routine. There were no complications. The wound remained clean dry and intact. Pt had appropriate back soreness. No complaints of leg pain or new N/T/W. The patient remained afebrile with stable vital signs, and tolerated a regular diet. The patient continued to increase activities, and pain was well controlled with oral pain medications.   Consults: None  Significant Diagnostic Studies:  Results for orders placed or performed during the hospital encounter of 08/03/18  Surgical pcr screen  Result Value Ref Range   MRSA, PCR NEGATIVE NEGATIVE   Staphylococcus aureus NEGATIVE NEGATIVE  CBC with Differential/Platelet  Result Value Ref Range   WBC 9.3 4.0 - 10.5 K/uL   RBC 4.54 4.22 - 5.81 MIL/uL   Hemoglobin 14.7 13.0 - 17.0 g/dL   HCT 44.8 39.0 - 52.0 %   MCV 98.7 78.0 - 100.0 fL   MCH 32.4 26.0 - 34.0 pg   MCHC 32.8 30.0 - 36.0 g/dL   RDW 12.6 11.5 - 15.5 %   Platelets 249 150 - 400 K/uL   Neutrophils Relative % 69 %   Neutro Abs 6.5 1.7 - 7.7 K/uL   Lymphocytes Relative 14 %   Lymphs Abs 1.3 0.7 - 4.0 K/uL   Monocytes Relative 12 %   Monocytes Absolute 1.1 (H) 0.1 - 1.0 K/uL   Eosinophils Relative 3 %   Eosinophils Absolute 0.3 0.0 - 0.7 K/uL   Basophils Relative 1 %   Basophils Absolute 0.1 0.0 - 0.1 K/uL   Immature Granulocytes 1 %   Abs Immature Granulocytes 0.1 0.0 - 0.1 K/uL  Protime-INR  Result Value Ref Range   Prothrombin Time 13.3  11.4 - 15.2 seconds   INR 7.90   Basic metabolic panel  Result Value Ref Range   Sodium 136 135 - 145 mmol/L   Potassium 3.7 3.5 - 5.1 mmol/L   Chloride 100 98 - 111 mmol/L   CO2 28 22 - 32 mmol/L   Glucose, Bld 105 (H) 70 - 99 mg/dL   BUN 12 8 - 23 mg/dL   Creatinine, Ser 0.94 0.61 - 1.24 mg/dL   Calcium 9.5 8.9 - 10.3 mg/dL   GFR calc non Af Amer >60 >60 mL/min   GFR calc Af Amer >60 >60 mL/min   Anion gap 8 5 - 15  Type and screen All Cardiac and thoracic surgeries, spinal fusions, myomectomies, craniotomies, colon & liver resections, total joint revisions, same day c-section with placenta previa or accreta.  Result Value Ref Range   ABO/RH(D) A POS    Antibody Screen NEG    Sample Expiration 08/17/2018    Extend sample reason      NO TRANSFUSIONS OR PREGNANCY IN THE PAST 3 MONTHS Performed at [facility], [address] [place], [place] [address]     Chest 2 View  Result Date: 08/09/2018 CLINICAL DATA:  Spinal stenosis preop EXAM: CHEST - 2 VIEW COMPARISON:  04/08/2015 FINDINGS: The heart size and mediastinal contours are within normal limits. Both  lungs are clear. The visualized skeletal structures are unremarkable. Mild apical scarring bilaterally IMPRESSION: No active cardiopulmonary disease. Electronically Signed   By: Franchot Gallo M.D.   On: 08/09/2018 11:54   Dg Lumbar Spine 2-3 Views  Result Date: 08/09/2018 CLINICAL DATA:  Lumbar fusion. EXAM: DG C-ARM 61-120 MIN; LUMBAR SPINE - 2-3 VIEW COMPARISON:  Films earlier this day. FINDINGS: Limited spot views of the thoracolumbar spine are submitted postoperatively for interpretation. Posterior rod/bipedicular screw and interbody fusion material identified of what appears to be L1-L2-L3. No gross complicating features noted. IMPRESSION: Posterior/interbody fusion at what appears to be L1-L2-L3. Electronically Signed   By: Margarette Canada M.D.   On: 08/09/2018 16:15   Dg C-arm 1-60 Min  Result Date: 08/09/2018 CLINICAL  DATA:  Lumbar fusion. EXAM: DG C-ARM 61-120 MIN; LUMBAR SPINE - 2-3 VIEW COMPARISON:  Films earlier this day. FINDINGS: Limited spot views of the thoracolumbar spine are submitted postoperatively for interpretation. Posterior rod/bipedicular screw and interbody fusion material identified of what appears to be L1-L2-L3. No gross complicating features noted. IMPRESSION: Posterior/interbody fusion at what appears to be L1-L2-L3. Electronically Signed   By: Margarette Canada M.D.   On: 08/09/2018 16:15    Antibiotics:  Anti-infectives (From admission, onward)   Start     Dose/Rate Route Frequency Ordered Stop   08/09/18 2200  ceFAZolin (ANCEF) IVPB 2g/100 mL premix     2 g 200 mL/hr over 30 Minutes Intravenous Every 8 hours 08/09/18 2038 08/10/18 0652   08/09/18 1544  vancomycin (VANCOCIN) powder  Status:  Discontinued       As needed 08/09/18 1544 08/09/18 1633   08/09/18 1414  bacitracin 50,000 Units in sodium chloride 0.9 % 500 mL irrigation  Status:  Discontinued       As needed 08/09/18 1414 08/09/18 1633   08/09/18 1145  ceFAZolin (ANCEF) IVPB 2g/100 mL premix     2 g 200 mL/hr over 30 Minutes Intravenous On call to O.R. 08/09/18 1133 08/09/18 1402      Discharge Exam: Blood pressure 129/71, pulse 70, temperature 98.6 F (37 C), temperature source Oral, resp. rate 16, height 5\' 10"  (1.778 m), weight 92.5 kg, SpO2 95 %. Neurologic: Grossly normal Dressing dry  Discharge Medications:   Allergies as of 08/11/2018   No Active Allergies     Medication List    STOP taking these medications   acetaminophen-codeine 300-30 MG tablet Commonly known as:  TYLENOL #3   ALEVE 220 MG tablet Generic drug:  naproxen sodium   methylPREDNISolone 4 MG tablet Commonly known as:  MEDROL     TAKE these medications   finasteride 5 MG tablet Commonly known as:  PROSCAR Take 5 mg by mouth every evening.   Fish Oil 1200 MG Caps Take 1,200 mg by mouth daily.   fluticasone 50 MCG/ACT nasal  spray Commonly known as:  FLONASE Place 2 sprays into both nostrils every evening.   hydrochlorothiazide 50 MG tablet Commonly known as:  HYDRODIURIL Take 50 mg by mouth daily.   lisinopril 20 MG tablet Commonly known as:  PRINIVIL,ZESTRIL Take 20 mg by mouth daily.   oxyCODONE 5 MG immediate release tablet Commonly known as:  Oxy IR/ROXICODONE Take 1 tablet (5 mg total) by mouth every 6 (six) hours as needed for moderate pain ((score 4 to 6)).   PRESERVISION AREDS 2 PO Take 1 tablet by mouth 2 (two) times daily.   ranitidine 150 MG tablet Commonly known as:  ZANTAC Take 150  mg by mouth daily as needed for heartburn.   SALONPAS PAIN RELIEF PATCH EX Place 1 patch onto the skin daily as needed (for pain.).   SYSTANE 0.4-0.3 % Soln Generic drug:  Polyethyl Glycol-Propyl Glycol Place 1 drop into both eyes 3 (three) times daily as needed (dry eyes).   tamsulosin 0.4 MG Caps capsule Commonly known as:  FLOMAX Take 0.4 mg by mouth every evening.   tiZANidine 4 MG tablet Commonly known as:  ZANAFLEX Take 1 tablet (4 mg total) by mouth every 8 (eight) hours as needed for muscle spasms. What changed:    how much to take  how to take this  when to take this  reasons to take this  additional instructions            Durable Medical Equipment  (From admission, onward)         Start     Ordered   08/09/18 2039  DME Walker rolling  Once    Question:  Patient needs a walker to treat with the following condition  Answer:  S/P lumbar fusion   08/09/18 2038   08/09/18 2039  DME 3 n 1  Once     08/09/18 2038          Disposition: home   Final Dx: PLIF L1-2  Discharge Instructions    Call MD for:  difficulty breathing, headache or visual disturbances   Complete by:  As directed    Call MD for:  persistant nausea and vomiting   Complete by:  As directed    Call MD for:  redness, tenderness, or signs of infection (pain, swelling, redness, odor or green/yellow  discharge around incision site)   Complete by:  As directed    Call MD for:  severe uncontrolled pain   Complete by:  As directed    Call MD for:  temperature >100.4   Complete by:  As directed    Diet - low sodium heart healthy   Complete by:  As directed    Increase activity slowly   Complete by:  As directed    Remove dressing in 48 hours   Complete by:  As directed          Signed: Orvel Cutsforth S 08/11/2018, 8:33 AM

## 2018-08-11 NOTE — Discharge Instructions (Signed)
Laminectomy, Care After °This sheet gives you information about how to care for yourself after your procedure. Your health care provider may also give you more specific instructions. If you have problems or questions, contact your health care provider. °What can I expect after the procedure? °After the procedure, it is common to have: °· Some pain around your incision area. °· Muscle tightening (spasms) across the back. ° °Follow these instructions at home: °Incision care °· Follow instructions from your health care provider about how to take care of your incision area. Make sure you: °? Wash your hands with soap and water before and after you apply medicine to the area or change your bandage (dressing). If soap and water are not available, use hand sanitizer. °? Change your dressing as told by your health care provider. °? Leave stitches (sutures), skin glue, or adhesive strips in place. These skin closures may need to stay in place for 2 weeks or longer. If adhesive strip edges start to loosen and curl up, you may trim the loose edges. Do not remove adhesive strips completely unless your health care provider tells you to do that. °· Check your incision area every day for signs of infection. Check for: °? More redness, swelling, or pain. °? More fluid or blood. °? Warmth. °? Pus or a bad smell. °Medicines °· Take over-the-counter and prescription medicines only as told by your health care provider. °· If you were prescribed an antibiotic medicine, use it as told by your health care provider. Do not stop using the antibiotic even if you start to feel better. °Bathing °· Do not take baths, swim, or use a hot tub for 2 weeks, or until your incision has healed completely. °· If your health care provider approves, you may take showers after your dressing has been removed. °Activity °· Return to your normal activities as told by your health care provider. Ask your health care provider what activities are safe for  you. °· Avoid bending or twisting at your waist. Always bend at your knees. °· Do not sit for more than 20-30 minutes at a time. Lie down or walk between periods of sitting. °· Do not lift anything that is heavier than 10 lb (4.5 kg) or the limit that your health care provider tells you, until he or she says that it is safe. °· Do not drive for 2 weeks after your procedure or for as long as your health care provider tells you. °· Do not drive or use heavy machinery while taking prescription pain medicine. °General instructions °· To prevent or treat constipation while you are taking prescription pain medicine, your health care provider may recommend that you: °? Drink enough fluid to keep your urine clear or pale yellow. °? Take over-the-counter or prescription medicines. °? Eat foods that are high in fiber, such as fresh fruits and vegetables, whole grains, and beans. °? Limit foods that are high in fat and processed sugars, such as fried and sweet foods. °· Do breathing exercises as told. °· Keep all follow-up visits as told by your health care provider. This is important. °Contact a health care provider if: °· You have more redness, swelling, or pain around your incision area. °· Your incision feels warm to the touch. °· You are not able to return to activities or do exercises as told by your health care provider. °Get help right away if: °· You have: °? More fluid or blood coming from your incision area. °? Pus or   a bad smell coming from your incision area. °? Chills or a fever. °? Episodes of dizziness or fainting while standing. °· You develop a rash. °· You develop shortness of breath or you have difficulty breathing. °· You cannot control when you urinate or have a bowel movement. °· You become weak. °· You are not able to use your legs. °Summary °· After the procedure, it is common to have some pain around your incision area. You may also have muscle tightening (spasms) across the back. °· Follow  instructions from your health care provider about how to care for your incision. °· Do not lift anything that is heavier than 10 lb (4.5 kg) or the limit that your health care provider tells you, until he or she says that it is safe. °· Contact your health care provider if you have more redness, swelling, or pain around your incision area or if your incision feels warm to the touch. These can be signs of infection. °This information is not intended to replace advice given to you by your health care provider. Make sure you discuss any questions you have with your health care provider. °Document Released: 06/24/2005 Document Revised: 07/21/2016 Document Reviewed: 05/22/2016 °Elsevier Interactive Patient Education © 2018 [employer] ° °

## 2018-08-20 ENCOUNTER — Telehealth: Payer: Self-pay | Admitting: Physician Assistant

## 2018-08-20 ENCOUNTER — Encounter (HOSPITAL_COMMUNITY): Payer: Self-pay | Admitting: Emergency Medicine

## 2018-08-20 ENCOUNTER — Observation Stay (HOSPITAL_COMMUNITY)
Admission: EM | Admit: 2018-08-20 | Discharge: 2018-08-21 | Disposition: A | Discharge status: Home / Self Care | Payer: Medicare Other | Attending: Neurological Surgery | Admitting: Neurological Surgery

## 2018-08-20 ENCOUNTER — Inpatient Hospital Stay (HOSPITAL_COMMUNITY): Payer: Medicare Other

## 2018-08-20 ENCOUNTER — Other Ambulatory Visit: Payer: Self-pay

## 2018-08-20 DIAGNOSIS — I10 Essential (primary) hypertension: Secondary | ICD-10-CM | POA: Diagnosis not present

## 2018-08-20 DIAGNOSIS — L24A9 Irritant contact dermatitis due friction or contact with other specified body fluids: Secondary | ICD-10-CM | POA: Diagnosis present

## 2018-08-20 DIAGNOSIS — Z87891 Personal history of nicotine dependence: Secondary | ICD-10-CM | POA: Diagnosis not present

## 2018-08-20 DIAGNOSIS — Z79899 Other long term (current) drug therapy: Secondary | ICD-10-CM | POA: Insufficient documentation

## 2018-08-20 DIAGNOSIS — Y69 Unspecified misadventure during surgical and medical care: Secondary | ICD-10-CM | POA: Insufficient documentation

## 2018-08-20 DIAGNOSIS — T8131XA Disruption of external operation (surgical) wound, not elsewhere classified, initial encounter: Secondary | ICD-10-CM | POA: Diagnosis present

## 2018-08-20 DIAGNOSIS — T148XXA Other injury of unspecified body region, initial encounter: Secondary | ICD-10-CM | POA: Diagnosis present

## 2018-08-20 LAB — CBC WITH DIFFERENTIAL/PLATELET
Abs Immature Granulocytes: 0.1 10*3/uL (ref 0.0–0.1)
Basophils Absolute: 0.1 10*3/uL (ref 0.0–0.1)
Basophils Relative: 1 %
Eosinophils Absolute: 0.3 10*3/uL (ref 0.0–0.7)
Eosinophils Relative: 4 %
HCT: 34.4 % — ABNORMAL LOW (ref 39.0–52.0)
Hemoglobin: 11.5 g/dL — ABNORMAL LOW (ref 13.0–17.0)
Immature Granulocytes: 1 %
Lymphocytes Relative: 13 %
Lymphs Abs: 1.2 10*3/uL (ref 0.7–4.0)
MCH: 32.2 pg (ref 26.0–34.0)
MCHC: 33.4 g/dL (ref 30.0–36.0)
MCV: 96.4 fL (ref 78.0–100.0)
Monocytes Absolute: 1 10*3/uL (ref 0.1–1.0)
Monocytes Relative: 11 %
Neutro Abs: 6.2 10*3/uL (ref 1.7–7.7)
Neutrophils Relative %: 70 %
Platelets: 480 10*3/uL — ABNORMAL HIGH (ref 150–400)
RBC: 3.57 MIL/uL — ABNORMAL LOW (ref 4.22–5.81)
RDW: 12.2 % (ref 11.5–15.5)
WBC: 8.9 10*3/uL (ref 4.0–10.5)

## 2018-08-20 LAB — BASIC METABOLIC PANEL
Anion gap: 9 (ref 5–15)
BUN: 11 mg/dL (ref 8–23)
CO2: 29 mmol/L (ref 22–32)
Calcium: 9.5 mg/dL (ref 8.9–10.3)
Chloride: 99 mmol/L (ref 98–111)
Creatinine, Ser: 0.97 mg/dL (ref 0.61–1.24)
GFR calc Af Amer: >60 mL/min (ref >60)
GFR calc non Af Amer: >60 mL/min (ref >60)
Glucose, Bld: 95 mg/dL (ref 70–99)
Potassium: 3.4 mmol/L — ABNORMAL LOW (ref 3.5–5.1)
Sodium: 137 mmol/L (ref 135–145)

## 2018-08-20 MED ORDER — SODIUM CHLORIDE 0.9 % IV SOLN
INTRAVENOUS | Status: DC
  Administered 2018-08-20: 12:00:00 via INTRAVENOUS

## 2018-08-20 MED ORDER — ACETAMINOPHEN 650 MG RE SUPP: 650.0000 mg | Freq: Four times a day (QID) | RECTAL | Status: DC | PRN

## 2018-08-20 MED ORDER — SODIUM CHLORIDE 0.9% FLUSH: 3.0000 mL | INTRAVENOUS | Status: DC | PRN

## 2018-08-20 MED ORDER — GADOBENATE DIMEGLUMINE 529 MG/ML IV SOLN
18.0000 mL | Freq: Once | INTRAVENOUS | Status: AC | PRN
  Administered 2018-08-20: 18 mL via INTRAVENOUS

## 2018-08-20 MED ORDER — SENNOSIDES-DOCUSATE SODIUM 8.6-50 MG PO TABS: 1.0000 | ORAL_TABLET | Freq: Every evening | ORAL | Status: DC | PRN

## 2018-08-20 MED ORDER — WHITE PETROLATUM EX OINT
TOPICAL_OINTMENT | CUTANEOUS | Status: AC
  Administered 2018-08-20: 19:00:00
  Filled 2018-08-20: qty 28.35

## 2018-08-20 MED ORDER — ACETAMINOPHEN 325 MG PO TABS: 650.0000 mg | ORAL_TABLET | Freq: Four times a day (QID) | ORAL | Status: DC | PRN

## 2018-08-20 MED ORDER — ONDANSETRON HCL 4 MG/2ML IJ SOLN
4.0000 mg | Freq: Four times a day (QID) | INTRAMUSCULAR | Status: DC | PRN
  Administered 2018-08-21: 4 mg via INTRAVENOUS
  Filled 2018-08-20: qty 2

## 2018-08-20 MED ORDER — SODIUM CHLORIDE 0.9 % IV SOLN: 250.0000 mL | INTRAVENOUS | Status: DC | PRN

## 2018-08-20 MED ORDER — FLEET ENEMA 7-19 GM/118ML RE ENEM: 1.0000 | ENEMA | Freq: Once | RECTAL | Status: DC | PRN

## 2018-08-20 MED ORDER — HYDROMORPHONE HCL 1 MG/ML IJ SOLN: 1.0000 mg | INTRAMUSCULAR | Status: DC | PRN

## 2018-08-20 MED ORDER — SODIUM CHLORIDE 0.9% FLUSH
3.0000 mL | Freq: Two times a day (BID) | INTRAVENOUS | Status: DC
  Administered 2018-08-20: 3 mL via INTRAVENOUS

## 2018-08-20 MED ORDER — ZOLPIDEM TARTRATE 5 MG PO TABS
5.0000 mg | ORAL_TABLET | Freq: Every evening | ORAL | Status: DC | PRN
  Administered 2018-08-21: 5 mg via ORAL
  Filled 2018-08-20: qty 1

## 2018-08-20 MED ORDER — HYDROCODONE-ACETAMINOPHEN 5-325 MG PO TABS
1.0000 | ORAL_TABLET | ORAL | Status: DC | PRN
  Administered 2018-08-20: 1 via ORAL
  Administered 2018-08-20 – 2018-08-21 (×2): 2 via ORAL
  Filled 2018-08-20: qty 2
  Filled 2018-08-20: qty 1
  Filled 2018-08-20: qty 2

## 2018-08-20 MED ORDER — BISACODYL 5 MG PO TBEC: 5.0000 mg | DELAYED_RELEASE_TABLET | Freq: Every day | ORAL | Status: DC | PRN

## 2018-08-20 MED ORDER — DOCUSATE SODIUM 100 MG PO CAPS
100.0000 mg | ORAL_CAPSULE | Freq: Two times a day (BID) | ORAL | Status: DC
  Administered 2018-08-20 – 2018-08-21 (×2): 100 mg via ORAL
  Filled 2018-08-20 (×3): qty 1

## 2018-08-20 MED ORDER — OXYCODONE HCL 5 MG PO TABS: 5.0000 mg | ORAL_TABLET | ORAL | Status: DC | PRN

## 2018-08-20 MED ORDER — ONDANSETRON HCL 4 MG PO TABS: 4.0000 mg | ORAL_TABLET | Freq: Four times a day (QID) | ORAL | Status: DC | PRN

## 2018-08-20 NOTE — Progress Notes (Signed)
Patient admitted with steri strips and lumbar wound drainage. Old drainage noted on clothing was serosanguinous. No active draining. Site cleansed and new gauze dressing applied. Will monitor for drainage.

## 2018-08-20 NOTE — Progress Notes (Signed)
Patient transferred from ED. Patient is alert and lying flat in bed. Oriented to room and unit routine. Wife at bedside.

## 2018-08-20 NOTE — Telephone Encounter (Signed)
Patient called regarding drainage from surgical site S/P lumbar fusion. Hx consistent with CSF leak Advised patient to come to [facility] for evaluation and [facility] admission Please call [phone] upon patient arrival.

## 2018-08-20 NOTE — H&P (Addendum)
Chief Complaint   Chief Complaint  Patient presents with  . Drainage from Incision    HPI   HPI: Jay Tran is a 84 y.o. male who presents to ER due to wound drainage. He is s/p lumbar fusion by Dr Ronnald Ramp on 8/22. There were no post operative complications. He was discharged on 8/24 in stable condition. Starting roughly a week ago, he started to notice a small amount of clear bloody drainage from the surgical site particularly when changing positions. His wife tells me she called the office and was advised to place gauze over the incision and monitor. It continued to drain, but was not significant until this am, when he woke up and had a significant amount of drainage from the wound to the point that his clothes and sheets were saturated. He called me from home and I advised him to come to ER for wound check and admission. He currently feels well. Denies any headaches especially with positional changes. Minimal back soreness. No bowel/bladder dysfunction.  Patient Active Problem List   Diagnosis Date Noted  . Chronic pain of both shoulders 02/22/2017  . S/P lumbar spinal fusion 04/15/2015  . HNP (herniated nucleus pulposus), lumbar 10/26/2012    Class: Diagnosis of    PMH: Past Medical History:  Diagnosis Date  . Arthritis   . BPH (benign prostatic hypertrophy)   . 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 GERD (gastroesophageal reflux disease)   . Hearing aid worn    Hx: of right ear  . Hyperlipemia   . Hypertension   . Lumbar herniated disc   . Lumbar stenosis   . PONV (postoperative nausea and vomiting)   . Seasonal allergies    Hx: of    PSH: Past Surgical History:  Procedure Laterality Date  . COLONOSCOPY     Hx: of  . EYE SURGERY     bil catarcts 02/2016  . HERNIA REPAIR    . LUMBAR LAMINECTOMY  10/26/2012   Procedure: MICRODISCECTOMY LUMBAR LAMINECTOMY;  Surgeon: Marybelle Killings, MD;  Location: [facility];  Service: Orthopedics;  Laterality:  Right;  Right L4-5 Microdiscectomy  . MAXIMUM ACCESS (MAS)POSTERIOR LUMBAR INTERBODY FUSION (PLIF) 1 LEVEL N/A 04/15/2015   Procedure: Maximum Access Surgery Posterior Lumbar Interbody Fusion Lumbar two-Lumbar three extension of hardware;  Surgeon: Eustace Moore, MD;  Location: [place];  Service: Neurosurgery;  Laterality: N/A;  . ROTATOR CUFF REPAIR     right shoulder - 3 times  . TONSILLECTOMY    . TRANSURETHRAL RESECTION OF BLADDER TUMOR N/A 02/24/2017   Procedure: TRANSURETHRAL RESECTION OF BLADDER TUMOR (TURBT);  Surgeon: Alexis Frock, MD;  Location: [facility];  Service: Urology;  Laterality: N/A;     (Not in a hospital admission)  SH: Social History   Tobacco Use  . Smoking status: Former Smoker    Last attempt to quit: 11/12/1963    Years since quitting: 54.8  . Smokeless tobacco: Never Used  Substance Use Topics  . Alcohol use: No  . Drug use: No    MEDS: Prior to Admission medications   Medication Sig Start Date End Date Taking? Authorizing Provider  finasteride (PROSCAR) 5 MG tablet Take 5 mg by mouth every evening.     [provider]  fluticasone (FLONASE) 50 MCG/ACT nasal spray Place 2 sprays into both nostrils every evening.    [provider]  hydrochlorothiazide (HYDRODIURIL) 50 MG tablet Take 50 mg  by mouth daily.     [provider]  Liniments (SALONPAS PAIN RELIEF PATCH EX) Place 1 patch onto the skin daily as needed (for pain.).    [provider]  lisinopril (PRINIVIL,ZESTRIL) 20 MG tablet Take 20 mg by mouth daily.     [provider]  Multiple Vitamins-Minerals (PRESERVISION AREDS 2 PO) Take 1 tablet by mouth 2 (two) times daily.     [provider]  Omega-3 Fatty Acids (FISH OIL) 1200 MG CAPS Take 1,200 mg by mouth daily.    [provider]  oxyCODONE (OXY IR/ROXICODONE) 5 MG immediate release tablet Take 1 tablet (5 mg total) by mouth every 6 (six) hours as needed for moderate pain ((score 4  to 6)). 08/11/18   Eustace Moore, MD  ranitidine (ZANTAC) 150 MG tablet Take 150 mg by mouth daily as needed for heartburn.     [provider]  SYSTANE 0.4-0.3 % SOLN Place 1 drop into both eyes 3 (three) times daily as needed (dry eyes).    [provider]  Tamsulosin HCl (FLOMAX) 0.4 MG CAPS Take 0.4 mg by mouth every evening.     [provider]  tiZANidine (ZANAFLEX) 4 MG tablet Take 1 tablet (4 mg total) by mouth every 8 (eight) hours as needed for muscle spasms. 08/11/18   Eustace Moore, MD    ALLERGY: No Known Allergies  Social History   Tobacco Use  . Smoking status: Former Smoker    Last attempt to quit: 11/12/1963    Years since quitting: 54.8  . Smokeless tobacco: Never Used  Substance Use Topics  . Alcohol use: No     Family History  Problem Relation Age of Onset  . Stroke Mother   . Cancer - Prostate Father      ROS   Review of Systems  Constitutional: Negative.   HENT: Negative.   Eyes: Negative.   Respiratory: Negative.   Cardiovascular: Negative.   Gastrointestinal: Negative.   Genitourinary: Negative.   Musculoskeletal: Negative.   Skin: Negative.   Neurological: Negative.   Psychiatric/Behavioral: Negative.     Exam   Vitals:   08/20/18 1108  BP: 134/68  Pulse: 86  Resp: 16  Temp: 98.9 F (37.2 C)  SpO2: 99%   General appearance: WDWN, NAD, nontoxic Eyes: PERRL, Fundoscopic: normal Cardiovascular: Regular rate and rhythm without murmurs, rubs, gallops. No edema or variciosities. Distal pulses normal. Pulmonary: Clear to auscultation Musculoskeletal:     Muscle tone upper extremities: Normal    Muscle tone lower extremities: Normal    Motor exam: Upper Extremities Deltoid Bicep Tricep Grip  Right 5/5 5/5 5/5 5/5  Left 5/5 5/5 5/5 5/5   Lower Extremity IP Quad PF DF EHL  Right 5/5 5/5 5/5 5/5 5/5  Left 5/5 5/5 5/5 5/5 5/5   Neurological Awake, alert, oriented Memory and concentration grossly  intact Speech fluent, appropriate CNII: Visual fields normal CNIII/IV/VI: EOMI CNV: Facial sensation normal CNVII: Symmetric, normal strength CNVIII: Grossly normal CNIX: Normal palate movement CNXI: Trap and SCM strength normal CN XII: Tongue protrusion normal Sensation grossly intact to LT DTR: Normal Coordination (finger/nose & heel/shin): Normal  Incision Steri strips in place.  Some red discharge on gauze placed inferiorly at surgical site.  No drainage with direct palpation. No drainage when sitting. When patient goes from sitting to standing, steady amount of clear/pink discharge comes from inferior aspect of surgical site. There is no evidence of purulent discharge. No redness,  warmth, tenderness. Some localized swelling extending alongside surgical site.   Results - Imaging/Labs   No results found for this or any previous visit (from the past 48 hour(s)).  No results found.   Impression/Plan   84 y.o. male with clear/pink discharge from surgical site, highly concerning for CSF although he is without positional/extertional headaches as one would expect. Patient is nontoxic. Not concerned about infection. I have ordered an MRI of his lumbar spine to determine whether this is seroma vs true CSF leak. Will keep on bedrest/flat. Further recs pending MRI.

## 2018-08-20 NOTE — ED Triage Notes (Signed)
Had surgery on 22 on his back spinal fusion and he woke up todayand his Incision is leaking , his underwear was wet

## 2018-08-21 DIAGNOSIS — T8131XA Disruption of external operation (surgical) wound, not elsewhere classified, initial encounter: Secondary | ICD-10-CM | POA: Diagnosis not present

## 2018-08-21 MED ORDER — HYDROCODONE-ACETAMINOPHEN 5-325 MG PO TABS: 1.0000 | ORAL_TABLET | ORAL | 0 refills | Status: DC | PRN

## 2018-08-21 NOTE — Care Management Obs Status (Signed)
[place] NOTIFICATION   Patient Details  Name: Jay Tran MRN: [id number] Date of Birth: February 01, 1934   Medicare Observation Status Notification Given:  Yes    Pollie Friar, RN 08/21/2018, 9:06 AM

## 2018-08-21 NOTE — Care Management Note (Signed)
Case Management Note  Patient Details  Name: Jay Tran MRN: [id number] Date of Birth: Jun 15, 1934  Subjective/Objective:       Pt in with drainage from his back wound. Pt is from home with his spouse.              Action/Plan: Pt discharging home with self care. Pt has insurance, PCP,, and transportation home.   Expected Discharge Date:  08/21/18               Expected Discharge Plan:  Home/Self Care  In-House Referral:     Discharge planning Services     Post Acute Care Choice:    Choice offered to:     DME Arranged:    DME Agency:     HH Arranged:    HH Agency:     Status of Service:  Completed, signed off  If discussed at [employer] of Stay Meetings, dates discussed:    Additional Comments:  Pollie Friar, RN 08/21/2018, 12:06 PM

## 2018-08-21 NOTE — Progress Notes (Signed)
NURSING PROGRESS NOTE  Jay Tran [id number] Discharge Data: 08/21/2018 10:57 AM Attending Provider: Eustace Moore, MD TDV:[id number], Curt Jews, MD     Fredderick Erb to be D/C'd Home per MD order.  Discussed with the patient the After Visit Summary and all questions fully answered. All IV's discontinued with no bleeding noted. All belongings returned to patient for patient to take home.   Last Vital Signs:  Blood pressure (!) 148/65, pulse 63, temperature 97.8 F (36.6 C), temperature source Oral, resp. rate 20, height 5\' 10"  (1.778 m), weight 89.6 kg, SpO2 96 %.  Discharge Medication List Allergies as of 08/21/2018   No Known Allergies     Medication List    TAKE these medications   finasteride 5 MG tablet Commonly known as:  PROSCAR Take 5 mg by mouth every evening.   Fish Oil 1200 MG Caps Take 1,200 mg by mouth daily.   fluticasone 50 MCG/ACT nasal spray Commonly known as:  FLONASE Place 2 sprays into both nostrils every evening.   hydrochlorothiazide 50 MG tablet Commonly known as:  HYDRODIURIL Take 50 mg by mouth daily.   HYDROcodone-acetaminophen 5-325 MG tablet Commonly known as:  NORCO/VICODIN Take 1-2 tablets by mouth every 4 (four) hours as needed for moderate pain.   lisinopril 20 MG tablet Commonly known as:  PRINIVIL,ZESTRIL Take 20 mg by mouth daily.   oxyCODONE 5 MG immediate release tablet Commonly known as:  Oxy IR/ROXICODONE Take 1 tablet (5 mg total) by mouth every 6 (six) hours as needed for moderate pain ((score 4 to 6)).   PRESERVISION AREDS 2 PO Take 1 tablet by mouth 2 (two) times daily.   ranitidine 150 MG tablet Commonly known as:  ZANTAC Take 150 mg by mouth daily as needed for heartburn.   SALONPAS PAIN RELIEF PATCH EX Place 1 patch onto the skin daily as needed (for pain.).   SYSTANE 0.4-0.3 % Soln Generic drug:  Polyethyl Glycol-Propyl Glycol Place 1 drop into both eyes 3 (three) times daily as needed (dry eyes).    tamsulosin 0.4 MG Caps capsule Commonly known as:  FLOMAX Take 0.4 mg by mouth every evening.   tiZANidine 4 MG tablet Commonly known as:  ZANAFLEX Take 1 tablet (4 mg total) by mouth every 8 (eight) hours as needed for muscle spasms.

## 2018-08-21 NOTE — Care Management CC44 (Signed)
Condition Code 44 Documentation Completed  Patient Details  Name: YANDEL ZEINER MRN: [id number] Date of Birth: 1934/10/04   Condition Code 44 given:  Yes Patient signature on Condition Code 44 notice:  Yes Documentation of 2 MD's agreement:  Yes Code 44 added to claim:  Yes    Pollie Friar, RN 08/21/2018, 9:06 AM

## 2018-08-21 NOTE — Discharge Summary (Signed)
Physician Discharge Summary  Patient ID: Jay Tran MRN: [id number] DOB/AGE: [birth year] 84 y.o.  Admit date: 08/20/2018 Discharge date: 08/21/2018  Admission Diagnoses: wound drainage    Discharge Diagnoses: same   Discharged Condition: good  Hospital Course: The patient was admitted on 08/20/2018 with serosanguineous wound drainage. The wound was not red or tender. No fevers. He has not been having increasing back pain or leg pain. In fact he was getting better.The hospital course was routine. There were no complications. The wound remained clean dry and intact. Was no drainage from the wound this morning. I reviewed his MRI which showed what looked like a postoperative seroma. The wound looks great with no signs of infection. I did prep the skin and used a 21-gauge needle to extract about 25-30 mL of dark serosanguineous fluid. . The patient remained afebrile with stable vital signs, and tolerated a regular diet. The patient continued to increase activities, and pain was well controlled with oral pain medications. He felt he was safe for discharge home. I have low suspicion for wound infection or CSF leak.  Consults: None  Significant Diagnostic Studies:  Results for orders placed or performed during the hospital encounter of 82/82/99  Basic metabolic panel  Result Value Ref Range   Sodium 137 135 - 145 mmol/L   Potassium 3.4 (L) 3.5 - 5.1 mmol/L   Chloride 99 98 - 111 mmol/L   CO2 29 22 - 32 mmol/L   Glucose, Bld 95 70 - 99 mg/dL   BUN 11 8 - 23 mg/dL   Creatinine, Ser 0.97 0.61 - 1.24 mg/dL   Calcium 9.5 8.9 - 10.3 mg/dL   GFR calc non Af Amer >60 >60 mL/min   GFR calc Af Amer >60 >60 mL/min   Anion gap 9 5 - 15  CBC WITH DIFFERENTIAL  Result Value Ref Range   WBC 8.9 4.0 - 10.5 K/uL   RBC 3.57 (L) 4.22 - 5.81 MIL/uL   Hemoglobin 11.5 (L) 13.0 - 17.0 g/dL   HCT 34.4 (L) 39.0 - 52.0 %   MCV 96.4 78.0 - 100.0 fL   MCH 32.2 26.0 - 34.0 pg   MCHC 33.4 30.0 - 36.0 g/dL   RDW  12.2 11.5 - 15.5 %   Platelets 480 (H) 150 - 400 K/uL   Neutrophils Relative % 70 %   Neutro Abs 6.2 1.7 - 7.7 K/uL   Lymphocytes Relative 13 %   Lymphs Abs 1.2 0.7 - 4.0 K/uL   Monocytes Relative 11 %   Monocytes Absolute 1.0 0.1 - 1.0 K/uL   Eosinophils Relative 4 %   Eosinophils Absolute 0.3 0.0 - 0.7 K/uL   Basophils Relative 1 %   Basophils Absolute 0.1 0.0 - 0.1 K/uL   Immature Granulocytes 1 %   Abs Immature Granulocytes 0.1 0.0 - 0.1 K/uL    Chest 2 View  Result Date: 08/09/2018 CLINICAL DATA:  Spinal stenosis preop EXAM: CHEST - 2 VIEW COMPARISON:  04/08/2015 FINDINGS: The heart size and mediastinal contours are within normal limits. Both lungs are clear. The visualized skeletal structures are unremarkable. Mild apical scarring bilaterally IMPRESSION: No active cardiopulmonary disease. Electronically Signed   By: Franchot Gallo M.D.   On: 08/09/2018 11:54   Dg Lumbar Spine 2-3 Views  Result Date: 08/09/2018 CLINICAL DATA:  Lumbar fusion. EXAM: DG C-ARM 61-120 MIN; LUMBAR SPINE - 2-3 VIEW COMPARISON:  Films earlier this day. FINDINGS: Limited spot views of the thoracolumbar spine are submitted postoperatively for  interpretation. Posterior rod/bipedicular screw and interbody fusion material identified of what appears to be L1-L2-L3. No gross complicating features noted. IMPRESSION: Posterior/interbody fusion at what appears to be L1-L2-L3. Electronically Signed   By: Margarette Canada M.D.   On: 08/09/2018 16:15   Mr Lumbar Spine W Wo Contrast  Result Date: 08/20/2018 CLINICAL DATA:  Increased drainage from surgical wound. Recent lumbar spine surgery. EXAM: MRI LUMBAR SPINE WITHOUT AND WITH CONTRAST TECHNIQUE: Multiplanar and multiecho pulse sequences of the lumbar spine were obtained without and with intravenous contrast. CONTRAST:  68m MULTIHANCE GADOBENATE DIMEGLUMINE 529 MG/ML IV SOLN COMPARISON:  MRI lumbar spine dated July 18, 2018. FINDINGS: Segmentation:  Standard. Alignment:   Unchanged trace retrolisthesis at L1-L2. Vertebrae: Interval L1-L2 posterior and interbody fusion. Mild endplate edema and enhancement at L1-L2. Interval removal of L4-L5 pedicle screws. Unchanged L2-L3 PLIF and L3-L4 and L4-L5 interbody fusions. L1 no fracture or focal bone lesion. Conus medullaris and cauda equina: Conus extends to the level. Conus and cauda equina appear normal. No intradural enhancement. Paraspinal and other soft tissues: There is a 2.4 x 5.4 x 12.6 cm multiloculated rim enhancing fluid collection in the midline back superficial soft tissues extending from L1-L2 through L4-L5. There is a small amount of fluid in the laminectomy bed at L1-L2 that does not clearly communicate with the larger fluid collection. Small amount of edema within the medial right psoas muscle at L1-L2. Disc levels: T12-L1:  Unchanged mild diffuse disc bulge.  No stenosis. L1-L2: Interval PLIF. Fluid in the laminectomy bed. Small residual left paracentral disc protrusion. Improved spinal canal stenosis, now moderate. Enhancing granulation tissue extends into both neural foramen. Bilateral neuroforaminal stenosis has improved. L2-L3: Prior decompression and fusion. No residual stenosis. Small amount of enhancing granulation tissue in the right neural foramen. L3-L4:  Prior interbody fusion.  No stenosis. L4-L5: Prior interbody fusion. Interval removal of the pedicle screws. No stenosis. L5-S1: Stable diffuse disc bulging and left far lateral disc osteophyte complex. Unchanged moderate bilateral facet arthropathy. No spinal canal stenosis. Unchanged mild bilateral neuroforaminal stenosis. IMPRESSION: 1. Large 12.6 cm rim enhancing fluid collection in the midline back superficial soft tissues extending from L1-L2 through L4-L5. This likely represents a seroma, although the sterility of this collection is indeterminate. 2. Interval L1-L2 PLIF. Residual moderate central spinal canal stenosis with fluid in the laminectomy bed.  This does not clearly communicate with the more superficial fluid collection. 3. Mild endplate edema and enhancement at L1-L2 is likely postsurgical, although osteomyelitis would have an identical appearance. Correlate with CRP and ESR. Electronically Signed   By: WTitus DubinM.D.   On: 08/20/2018 15:07   Dg C-arm 1-60 Min  Result Date: 08/09/2018 CLINICAL DATA:  Lumbar fusion. EXAM: DG C-ARM 61-120 MIN; LUMBAR SPINE - 2-3 VIEW COMPARISON:  Films earlier this day. FINDINGS: Limited spot views of the thoracolumbar spine are submitted postoperatively for interpretation. Posterior rod/bipedicular screw and interbody fusion material identified of what appears to be L1-L2-L3. No gross complicating features noted. IMPRESSION: Posterior/interbody fusion at what appears to be L1-L2-L3. Electronically Signed   By: JMargarette CanadaM.D.   On: 08/09/2018 16:15    Antibiotics:  Anti-infectives (From admission, onward)   None      Discharge Exam: Blood pressure (!) 142/67, pulse 65, temperature 98.4 F (36.9 C), temperature source Oral, resp. rate 18, height 5' 10"  (1.778 m), weight 89.6 kg, SpO2 97 %. Neurologic: Grossly normal Incision clean dry and intact  Discharge Medications:   Allergies  as of 08/21/2018   No Known Allergies     Medication List    TAKE these medications   finasteride 5 MG tablet Commonly known as:  PROSCAR Take 5 mg by mouth every evening.   Fish Oil 1200 MG Caps Take 1,200 mg by mouth daily.   fluticasone 50 MCG/ACT nasal spray Commonly known as:  FLONASE Place 2 sprays into both nostrils every evening.   hydrochlorothiazide 50 MG tablet Commonly known as:  HYDRODIURIL Take 50 mg by mouth daily.   HYDROcodone-acetaminophen 5-325 MG tablet Commonly known as:  NORCO/VICODIN Take 1-2 tablets by mouth every 4 (four) hours as needed for moderate pain.   lisinopril 20 MG tablet Commonly known as:  PRINIVIL,ZESTRIL Take 20 mg by mouth daily.   oxyCODONE 5 MG  immediate release tablet Commonly known as:  Oxy IR/ROXICODONE Take 1 tablet (5 mg total) by mouth every 6 (six) hours as needed for moderate pain ((score 4 to 6)).   PRESERVISION AREDS 2 PO Take 1 tablet by mouth 2 (two) times daily.   ranitidine 150 MG tablet Commonly known as:  ZANTAC Take 150 mg by mouth daily as needed for heartburn.   SALONPAS PAIN RELIEF PATCH EX Place 1 patch onto the skin daily as needed (for pain.).   SYSTANE 0.4-0.3 % Soln Generic drug:  Polyethyl Glycol-Propyl Glycol Place 1 drop into both eyes 3 (three) times daily as needed (dry eyes).   tamsulosin 0.4 MG Caps capsule Commonly known as:  FLOMAX Take 0.4 mg by mouth every evening.   tiZANidine 4 MG tablet Commonly known as:  ZANAFLEX Take 1 tablet (4 mg total) by mouth every 8 (eight) hours as needed for muscle spasms.       Disposition: home   Final Dx: suspected wound seroma  Discharge Instructions    Call MD for:  difficulty breathing, headache or visual disturbances   Complete by:  As directed    Call MD for:  hives   Complete by:  As directed    Call MD for:  persistant dizziness or light-headedness   Complete by:  As directed    Call MD for:  persistant nausea and vomiting   Complete by:  As directed    Call MD for:  redness, tenderness, or signs of infection (pain, swelling, redness, odor or green/yellow discharge around incision site)   Complete by:  As directed    Call MD for:  severe uncontrolled pain   Complete by:  As directed    Call MD for:  temperature >100.4   Complete by:  As directed    Diet - low sodium heart healthy   Complete by:  As directed    Increase activity slowly   Complete by:  As directed       Follow-up Information    Eustace Moore, MD. Schedule an appointment as soon as possible for a visit in 2 week(s).   Specialty:  Neurosurgery Contact information: [address] [place] [address] [place] [address] [phone]             Signed: Eustace Moore 08/21/2018, 8:23 AM

## 2019-06-17 ENCOUNTER — Other Ambulatory Visit: Payer: Self-pay | Admitting: Neurological Surgery

## 2019-06-17 DIAGNOSIS — M961 Postlaminectomy syndrome, not elsewhere classified: Secondary | ICD-10-CM

## 2019-06-19 ENCOUNTER — Ambulatory Visit
Admission: RE | Admit: 2019-06-19 | Discharge: 2019-06-19 | Disposition: A | Discharge status: Home / Self Care | Payer: Medicare Other | Source: Ambulatory Visit | Attending: Neurological Surgery | Admitting: Neurological Surgery

## 2019-06-19 ENCOUNTER — Other Ambulatory Visit: Payer: Self-pay

## 2019-06-19 DIAGNOSIS — M961 Postlaminectomy syndrome, not elsewhere classified: Secondary | ICD-10-CM

## 2019-07-04 ENCOUNTER — Encounter: Payer: Self-pay | Admitting: Allergy

## 2019-07-04 ENCOUNTER — Other Ambulatory Visit: Payer: Self-pay

## 2019-07-04 ENCOUNTER — Ambulatory Visit (INDEPENDENT_AMBULATORY_CARE_PROVIDER_SITE_OTHER): Payer: Medicare Other | Admitting: Allergy

## 2019-07-04 VITALS — BP 152/70 | HR 70 | Temp 98.0°F | Resp 16 | Ht 69.0 in | Wt 198.0 lb

## 2019-07-04 DIAGNOSIS — J3089 Other allergic rhinitis: Secondary | ICD-10-CM | POA: Diagnosis not present

## 2019-07-04 DIAGNOSIS — H1013 Acute atopic conjunctivitis, bilateral: Secondary | ICD-10-CM | POA: Diagnosis not present

## 2019-07-04 MED ORDER — AZELASTINE HCL 0.1 % NA SOLN: 2.0000 | Freq: Two times a day (BID) | NASAL | 5 refills | Status: DC

## 2019-07-04 NOTE — Progress Notes (Signed)
New Patient Note  RE: Jay Tran MRN: [id number] DOB: 07-27-1934 Date of Office Visit: 07/04/2019  Referring provider: Leonard Downing, * Primary care provider: Leonard Downing, MD  Chief Complaint: congestion  History of present illness: Jay Tran is a 85 y.o. male presenting today for consultation for congestion and cat allergy.   He reports he has a lot of clear thick nasal drainage that is hard to swallow.  He reports he has to cough it up.  This happens nightly for the past 3 years.  He states his nose runs in the morning and will resolve after he blows his nose.  He states he may occasional have itchy eye and uses an eye drop that helps.  He has tried [facility] which does not help his and nasal symptoms.  He does not believe he has tried a nasal antihistamine at this time.  He denies a history of asthma, eczema and food allergy.  Review of systems: Review of Systems  Constitutional: Negative for chills, fever and malaise/fatigue.  HENT: Positive for congestion. Negative for ear discharge, ear pain, nosebleeds, sinus pain and sore throat.   Eyes: Negative for pain, discharge and redness.  Respiratory: Negative for cough, shortness of breath and wheezing.   Cardiovascular: Negative for chest pain.  Gastrointestinal: Negative for abdominal pain, constipation, diarrhea, heartburn, nausea and vomiting.  Musculoskeletal: Negative for joint pain.  Skin: Negative for itching and rash.  Neurological: Negative for headaches.    All other systems negative unless noted above in HPI  Past medical history: Past Medical History:  Diagnosis Date  . Arthritis   . BPH (benign prostatic hypertrophy)   . 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 GERD (gastroesophageal reflux disease)   . Hearing aid worn    Hx: of right ear  . Hyperlipemia   . Hypertension   . Lumbar herniated disc   . Lumbar stenosis   . PONV (postoperative nausea and  vomiting)   . Seasonal allergies    Hx: of    Past surgical history: Past Surgical History:  Procedure Laterality Date  . COLONOSCOPY     Hx: of  . EYE SURGERY     bil catarcts 02/2016  . HERNIA REPAIR    . LUMBAR LAMINECTOMY  10/26/2012   Procedure: MICRODISCECTOMY LUMBAR LAMINECTOMY;  Surgeon: Marybelle Killings, MD;  Location: [facility];  Service: Orthopedics;  Laterality: Right;  Right L4-5 Microdiscectomy  . MAXIMUM ACCESS (MAS)POSTERIOR LUMBAR INTERBODY FUSION (PLIF) 1 LEVEL N/A 04/15/2015   Procedure: Maximum Access Surgery Posterior Lumbar Interbody Fusion Lumbar two-Lumbar three extension of hardware;  Surgeon: Eustace Moore, MD;  Location: [facility];  Service: Neurosurgery;  Laterality: N/A;  . ROTATOR CUFF REPAIR     right shoulder - 3 times  . TONSILLECTOMY    . TRANSURETHRAL RESECTION OF BLADDER TUMOR N/A 02/24/2017   Procedure: TRANSURETHRAL RESECTION OF BLADDER TUMOR (TURBT);  Surgeon: Alexis Frock, MD;  Location: [facility];  Service: Urology;  Laterality: N/A;    Family history:  Family History  Problem Relation Age of Onset  . Stroke Mother   . Cancer - Prostate Father     Social history: Lives in a home with carpeting with oil heating and central cooling.  Cat in the home.  There is no concern for water damage, mildew which is in the home.  He is retired.  He has a smoking history from  [id number].  Medication List: Allergies as of 07/04/2019   No Known Allergies     Medication List       Accurate as of July 04, 2019  6:21 PM. If you have any questions, ask your nurse or doctor.        STOP taking these medications   fluticasone 50 MCG/ACT nasal spray Commonly known as: FLONASE Stopped by: Lory Nowaczyk Charmian Muff, MD   HYDROcodone-acetaminophen 5-325 MG tablet Commonly known as: NORCO/VICODIN Stopped by: Edmon Magid Charmian Muff, MD   oxyCODONE 5 MG immediate release tablet Commonly known as: Oxy IR/ROXICODONE Stopped by: Brinlee Gambrell Charmian Muff, MD      TAKE these medications   finasteride 5 MG tablet Commonly known as: PROSCAR Take 5 mg by mouth every evening.   Fish Oil 1200 MG Caps Take 1,200 mg by mouth daily.   hydrochlorothiazide 50 MG tablet Commonly known as: HYDRODIURIL Take 50 mg by mouth daily.   lisinopril 20 MG tablet Commonly known as: ZESTRIL Take 20 mg by mouth daily.   PRESERVISION AREDS 2 PO Take 1 tablet by mouth 2 (two) times daily.   QC TUMERIC COMPLEX PO Take by mouth.   ranitidine 150 MG tablet Commonly known as: ZANTAC Take 150 mg by mouth daily as needed for heartburn.   SALONPAS PAIN RELIEF PATCH EX Place 1 patch onto the skin daily as needed (for pain.).   Systane 0.4-0.3 % Soln Generic drug: Polyethyl Glycol-Propyl Glycol Place 1 drop into both eyes 3 (three) times daily as needed (dry eyes).   tamsulosin 0.4 MG Caps capsule Commonly known as: FLOMAX Take 0.4 mg by mouth every evening.   tiZANidine 4 MG tablet Commonly known as: Zanaflex Take 1 tablet (4 mg total) by mouth every 8 (eight) hours as needed for muscle spasms.   traMADol 50 MG tablet Commonly known as: ULTRAM Take 50 mg by mouth every 6 (six) hours as needed.       Known medication allergies: No Known Allergies   Physical examination: Blood pressure (!) 152/70, pulse 70, temperature 98 F (36.7 C), temperature source Temporal, resp. rate 16, height 5\' 9"  (1.753 m), weight 198 lb (89.8 kg), SpO2 94 %.  General: Alert, interactive, in no acute distress. HEENT: PERRLA, TMs pearly gray, turbinates moderately edematous without discharge, post-pharynx non erythematous. Neck: Supple without lymphadenopathy. Lungs: Clear to auscultation without wheezing, rhonchi or rales. {no increased work of breathing. CV: Normal S1, S2 without murmurs. Abdomen: Nondistended, nontender. Skin: Warm and dry, without lesions or rashes. Extremities:  No clubbing, cyanosis or edema. Neuro:   Grossly intact.  Diagnositics/Labs: Allergy  testing: Environmental allergy skin prick testing is positive to phoma betae, dust mites, dog.  Intradermal testing was positive to mold mix 2, mold mix 4.  Allergy testing results were read and interpreted by provider, documented by clinical staff.   Assessment and plan:   Allergic Rhinitis with conjunctivitis  - environmental allergy skin testing today is positive to dust mites, dog and molds  - allergen avoidance measures discussed/handouts provided  - for control of nasal drainage/post-nasal drip recommend use of nasal antihistamine, Astelin 2 sprays each nostril before bedtime.  May use dose in the morning if needed  - for itchy eyes can use over-the-counter Pataday 1 drop each eye daily as needed  - recommend use of nasal saline rinse to help flush out the nose before bedtime and prior to medicated nasal spray use  Follow-up 4 months or sooner if needed I appreciate the  opportunity to take part in Affan's care. Please do not hesitate to contact me with questions.  Sincerely,   Prudy Feeler, MD Allergy/Immunology [facility]

## 2019-07-04 NOTE — Patient Instructions (Addendum)
Allergic Rhinitis with conjunctivitis  - environmental allergy skin testing today is positive to dust mites, dog and molds  - allergen avoidance measures discussed/handouts provided  - for control of nasal drainage/post-nasal drip recommend use of nasal antihistamine, Astelin 2 sprays each nostril before bedtime.  May use dose in the morning if needed  - for itchy eyes can use over-the-counter Pataday 1 drop each eye daily as needed  - recommend use of nasal saline rinse to help flush out the nose before bedtime and prior to medicated nasal spray use  Follow-up 4 months or sooner if needed

## 2019-07-17 ENCOUNTER — Other Ambulatory Visit: Payer: Self-pay

## 2019-07-17 ENCOUNTER — Ambulatory Visit (INDEPENDENT_AMBULATORY_CARE_PROVIDER_SITE_OTHER): Payer: Medicare Other

## 2019-07-17 ENCOUNTER — Encounter: Payer: Self-pay | Admitting: Orthopaedic Surgery

## 2019-07-17 ENCOUNTER — Ambulatory Visit (INDEPENDENT_AMBULATORY_CARE_PROVIDER_SITE_OTHER): Payer: Medicare Other | Admitting: Orthopaedic Surgery

## 2019-07-17 DIAGNOSIS — M79642 Pain in left hand: Secondary | ICD-10-CM

## 2019-07-17 DIAGNOSIS — M76822 Posterior tibial tendinitis, left leg: Secondary | ICD-10-CM

## 2019-07-17 DIAGNOSIS — M76821 Posterior tibial tendinitis, right leg: Secondary | ICD-10-CM

## 2019-07-17 NOTE — Progress Notes (Signed)
Office Visit Note   Patient: Jay Tran           Date of Birth: Apr 20, 1934           MRN: [id number] Visit Date: 07/17/2019              Requested by: Leonard Downing, MD [place],  [place] [address] PCP: Leonard Downing, MD   Assessment & Plan: Visit Diagnoses:  1. Pain in left hand   2. Posterior tibial tendon dysfunction (PTTD) of both lower extremities     Plan: We will send in physical therapy for modalities and strengthening home exercise program for his posterior tibial insufficiency right greater than left.  In regards to the hands recommend that he try diclofenac gel which she can buy off-the-shelf and apply 2 g up to 4 times daily.  Also would like for him to obtain inserts with a medial hindfoot wedge to help support his posterior tibial tendon.  He is to be good about shoe wear and changed shoes out daily.  Questions encouraged and answered at length.  He will follow-up on an as-needed basis.  Follow-Up Instructions: Return if symptoms worsen or fail to improve.   Orders:  Orders Placed This Encounter  Procedures  . XR Hand Complete Left   No orders of the defined types were placed in this encounter.     Procedures: No procedures performed   Clinical Data: No additional findings.   Subjective: Chief Complaint  Patient presents with  . Left Hand - Injury, Pain    HPI Jay Tran is a 85 year old male comes in today due to left thumb and left index finger after a fall 2 days ago.  He states that his right foot turns out and causes him to fall occasionally.  He had no injury to the foot.  He has been using over-the-counter patches on his fingers.  He denies any loss consciousness or dizziness at the time of the fall.  Review of Systems  Constitutional: Negative for chills and fever.  Respiratory: Negative for shortness of breath.      Objective: Vital Signs: There were no vitals taken for this visit.  Physical Exam  Constitutional:      Appearance: He is not ill-appearing or diaphoretic.  Pulmonary:     Effort: Pulmonary effort is normal.  Neurological:     Mental Status: He is alert and oriented to person, place, and time.  Psychiatric:        Mood and Affect: Mood normal.     Ortho Exam Bilateral hands severe arthritic changes.  He has tenderness over the left first metacarpal and the middle phalanx of the left index finger.  No rashes skin lesions ulcerations either hand.  No ecchymosis of either hand.  Radial pulses are intact bilaterally.  Bilateral feet acquired pes planus.  Bilateral feet he has 5-5 strength with eversion against resistance.  Is diminished strength on the left with inversion against resistance and virtually no inversion of the right foot against resistance.  He is nontender over his posterior tibial tendons bilaterally.  Is able do a single heel raise on the left but not able to perform one on the right. Specialty Comments:  No specialty comments available.  Imaging: Xr Hand Complete Left  Result Date: 07/17/2019 Left hand 3 views.  No acute fractures or acute bony abnormalities.  Arthritic changes throughout the hand and affecting the phalanges and metacarpal joints.  No apparent  dislocations.  End-stage CMC joint arthritis    PMFS History: Patient Active Problem List   Diagnosis Date Noted  . Wound drainage 08/20/2018  . Chronic pain of both shoulders 02/22/2017  . S/P lumbar spinal fusion 04/15/2015  . HNP (herniated nucleus pulposus), lumbar 10/26/2012    Class: Diagnosis of   Past Medical History:  Diagnosis Date  . Arthritis   . BPH (benign prostatic hypertrophy)   . 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 GERD (gastroesophageal reflux disease)   . Hearing aid worn    Hx: of right ear  . Hyperlipemia   . Hypertension   . Lumbar herniated disc   . Lumbar stenosis   . PONV (postoperative nausea and vomiting)   . Seasonal  allergies    Hx: of    Family History  Problem Relation Age of Onset  . Stroke Mother   . Cancer - Prostate Father     Past Surgical History:  Procedure Laterality Date  . COLONOSCOPY     Hx: of  . EYE SURGERY     bil catarcts 02/2016  . HERNIA REPAIR    . LUMBAR LAMINECTOMY  10/26/2012   Procedure: MICRODISCECTOMY LUMBAR LAMINECTOMY;  Surgeon: Marybelle Killings, MD;  Location: [facility];  Service: Orthopedics;  Laterality: Right;  Right L4-5 Microdiscectomy  . MAXIMUM ACCESS (MAS)POSTERIOR LUMBAR INTERBODY FUSION (PLIF) 1 LEVEL N/A 04/15/2015   Procedure: Maximum Access Surgery Posterior Lumbar Interbody Fusion Lumbar two-Lumbar three extension of hardware;  Surgeon: Eustace Moore, MD;  Location: [facility];  Service: Neurosurgery;  Laterality: N/A;  . ROTATOR CUFF REPAIR     right shoulder - 3 times  . TONSILLECTOMY    . TRANSURETHRAL RESECTION OF BLADDER TUMOR N/A 02/24/2017   Procedure: TRANSURETHRAL RESECTION OF BLADDER TUMOR (TURBT);  Surgeon: Alexis Frock, MD;  Location: [facility];  Service: Urology;  Laterality: N/A;   Social History   Occupational History  . Not on file  Tobacco Use  . Smoking status: Former Smoker    Quit date: 11/12/1963    Years since quitting: 55.7  . Smokeless tobacco: Never Used  Substance and Sexual Activity  . Alcohol use: No  . Drug use: No  . Sexual activity: Yes

## 2019-10-09 ENCOUNTER — Encounter (INDEPENDENT_AMBULATORY_CARE_PROVIDER_SITE_OTHER): Payer: Medicare Other | Admitting: Ophthalmology

## 2019-10-09 DIAGNOSIS — H35033 Hypertensive retinopathy, bilateral: Secondary | ICD-10-CM

## 2019-10-09 DIAGNOSIS — H43813 Vitreous degeneration, bilateral: Secondary | ICD-10-CM

## 2019-10-09 DIAGNOSIS — H353211 Exudative age-related macular degeneration, right eye, with active choroidal neovascularization: Secondary | ICD-10-CM

## 2019-10-09 DIAGNOSIS — H353122 Nonexudative age-related macular degeneration, left eye, intermediate dry stage: Secondary | ICD-10-CM | POA: Diagnosis not present

## 2019-10-09 DIAGNOSIS — I10 Essential (primary) hypertension: Secondary | ICD-10-CM | POA: Diagnosis not present

## 2019-10-14 ENCOUNTER — Encounter (INDEPENDENT_AMBULATORY_CARE_PROVIDER_SITE_OTHER): Payer: Medicare Other | Admitting: Ophthalmology

## 2019-11-04 ENCOUNTER — Encounter (INDEPENDENT_AMBULATORY_CARE_PROVIDER_SITE_OTHER): Payer: Medicare Other | Admitting: Ophthalmology

## 2019-11-04 DIAGNOSIS — H353211 Exudative age-related macular degeneration, right eye, with active choroidal neovascularization: Secondary | ICD-10-CM

## 2019-11-04 DIAGNOSIS — H43813 Vitreous degeneration, bilateral: Secondary | ICD-10-CM

## 2019-11-04 DIAGNOSIS — H35033 Hypertensive retinopathy, bilateral: Secondary | ICD-10-CM | POA: Diagnosis not present

## 2019-11-04 DIAGNOSIS — I10 Essential (primary) hypertension: Secondary | ICD-10-CM

## 2019-11-06 ENCOUNTER — Ambulatory Visit: Payer: Medicare Other | Admitting: Allergy

## 2019-11-26 ENCOUNTER — Ambulatory Visit (INDEPENDENT_AMBULATORY_CARE_PROVIDER_SITE_OTHER): Payer: Medicare Other | Admitting: Otolaryngology

## 2019-11-26 ENCOUNTER — Other Ambulatory Visit: Payer: Self-pay

## 2019-11-26 VITALS — Temp 99.5°F

## 2019-11-26 DIAGNOSIS — J310 Chronic rhinitis: Secondary | ICD-10-CM | POA: Diagnosis not present

## 2019-11-26 DIAGNOSIS — K219 Gastro-esophageal reflux disease without esophagitis: Secondary | ICD-10-CM

## 2019-11-26 MED ORDER — MOMETASONE FUROATE 50 MCG/ACT NA SUSP: 2.0000 | Freq: Every day | NASAL | 12 refills | Status: DC

## 2019-11-26 MED ORDER — OMEPRAZOLE 40 MG PO CPDR: 40.0000 mg | DELAYED_RELEASE_CAPSULE | Freq: Every day | ORAL | 3 refills | Status: DC

## 2019-11-26 NOTE — Progress Notes (Addendum)
HPI: Jay Tran is a 85 y.o. male who presents is referred by Dr. Arelia Sneddon for evaluation of chronic postnasal drainage that he has to cough up frequently and sometimes obstructs his airway or covers his windpipe.Jay Tran  He has tried several medical treatments including nasal steroid sprays as well as antihistamines.  He complains of chronic postnasal drainage that goes down his throat he has to cough it up frequently.  He is having no airway problems presently.  No hoarseness.  The congestion seems worse at night.  He does not have that much trouble breathing through his nose.Jay Tran  Past Medical History:  Diagnosis Date  . Arthritis   . BPH (benign prostatic hypertrophy)   . 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 GERD (gastroesophageal reflux disease)   . Hearing aid worn    Hx: of right ear  . Hyperlipemia   . Hypertension   . Lumbar herniated disc   . Lumbar stenosis   . PONV (postoperative nausea and vomiting)   . Seasonal allergies    Hx: of   Past Surgical History:  Procedure Laterality Date  . COLONOSCOPY     Hx: of  . EYE SURGERY     bil catarcts 02/2016  . HERNIA REPAIR    . LUMBAR LAMINECTOMY  10/26/2012   Procedure: MICRODISCECTOMY LUMBAR LAMINECTOMY;  Surgeon: Marybelle Killings, MD;  Location: [place];  Service: Orthopedics;  Laterality: Right;  Right L4-5 Microdiscectomy  . MAXIMUM ACCESS (MAS)POSTERIOR LUMBAR INTERBODY FUSION (PLIF) 1 LEVEL N/A 04/15/2015   Procedure: Maximum Access Surgery Posterior Lumbar Interbody Fusion Lumbar two-Lumbar three extension of hardware;  Surgeon: Eustace Moore, MD;  Location: [facility];  Service: Neurosurgery;  Laterality: N/A;  . ROTATOR CUFF REPAIR     right shoulder - 3 times  . TONSILLECTOMY    . TRANSURETHRAL RESECTION OF BLADDER TUMOR N/A 02/24/2017   Procedure: TRANSURETHRAL RESECTION OF BLADDER TUMOR (TURBT);  Surgeon: Alexis Frock, MD;  Location: [facility];  Service: Urology;  Laterality: N/A;   Social History    Socioeconomic History  . Marital status: Married    Spouse name: Not on file  . Number of children: Not on file  . Years of education: Not on file  . Highest education level: Not on file  Occupational History  . Not on file  Social Needs  . Financial resource strain: Not on file  . Food insecurity    Worry: Not on file    Inability: Not on file  . Transportation needs    Medical: Not on file    Non-medical: Not on file  Tobacco Use  . Smoking status: Former Smoker    Quit date: 11/12/1963    Years since quitting: 56.0  . Smokeless tobacco: Never Used  Substance and Sexual Activity  . Alcohol use: No  . Drug use: No  . Sexual activity: Yes  Lifestyle  . Physical activity    Days per week: Not on file    Minutes per session: Not on file  . Stress: Not on file  Relationships  . Social Herbalist on phone: Not on file    Gets together: Not on file    Attends religious service: Not on file    Active member of club or organization: Not on file    Attends meetings of clubs or organizations: Not on file    Relationship status: Not on file  Other  Topics Concern  . Not on file  Social History Narrative  . Not on file   Family History  Problem Relation Age of Onset  . Stroke Mother   . Cancer - Prostate Father    No Known Allergies Prior to Admission medications   Medication Sig Start Date End Date Taking? Authorizing Provider  azelastine (ASTELIN) 0.1 % nasal spray Place 2 sprays into both nostrils 2 (two) times daily. Use in each nostril as directed 07/04/19  Yes Padgett, Rae Halsted, MD  finasteride (PROSCAR) 5 MG tablet Take 5 mg by mouth every evening.    Yes [provider]  hydrochlorothiazide (HYDRODIURIL) 50 MG tablet Take 50 mg by mouth daily.    Yes [provider]  Liniments (SALONPAS PAIN RELIEF PATCH EX) Place 1 patch onto the skin daily as needed (for pain.).   Yes [provider]  lisinopril (PRINIVIL,ZESTRIL) 20  MG tablet Take 20 mg by mouth daily.    Yes [provider]  Multiple Vitamins-Minerals (PRESERVISION AREDS 2 PO) Take 1 tablet by mouth 2 (two) times daily.    Yes [provider]  Omega-3 Fatty Acids (FISH OIL) 1200 MG CAPS Take 1,200 mg by mouth daily.   Yes [provider]  ranitidine (ZANTAC) 150 MG tablet Take 150 mg by mouth daily as needed for heartburn.    Yes [provider]  SYSTANE 0.4-0.3 % SOLN Place 1 drop into both eyes 3 (three) times daily as needed (dry eyes).   Yes [provider]  Tamsulosin HCl (FLOMAX) 0.4 MG CAPS Take 0.4 mg by mouth every evening.    Yes [provider]  tiZANidine (ZANAFLEX) 4 MG tablet Take 1 tablet (4 mg total) by mouth every 8 (eight) hours as needed for muscle spasms. 08/11/18  Yes Eustace Moore, MD  traMADol (ULTRAM) 50 MG tablet Take 50 mg by mouth every 6 (six) hours as needed.   Yes [provider]  Turmeric (QC TUMERIC COMPLEX PO) Take by mouth.   Yes [provider]  mometasone (NASONEX) 50 MCG/ACT nasal spray Place 2 sprays into the nose daily. 11/26/19   Jay Nunnery, MD  omeprazole (PRILOSEC) 40 MG capsule Take 1 capsule (40 mg total) by mouth daily. 11/26/19   Jay Nunnery, MD     Positive ROS: Otherwise negative  All other systems have been reviewed and were otherwise negative with the exception of those mentioned in the HPI and as above.  Physical Exam: Constitutional: Alert, well-appearing, no acute distress Ears: External ears without lesions or tenderness. Ear canals are clear bilaterally with intact, clear TMs.  Nasal: External nose without lesions. Septum relatively midline.. Mild rhinitis.  Both middle meatus regions were clear.  On nasal endoscopy minimal clear mucus was noted. Fiberoptic laryngoscopy revealed a clear nasopharynx.  Base of tongue vallecula and epiglottis were normal.  Vocal cords were clear bilaterally with normal vocal cord  mobility.  Minimal supraglottic mucus noted.  Mild arytenoid edema consistent with probable laryngeal pharyngeal reflux. Oral: Lips and gums without lesions. Tongue and palate mucosa without lesions. Posterior oropharynx clear. Neck: No palpable adenopathy or masses Respiratory: Breathing comfortably  Skin: No facial/neck lesions or rash noted.  Laryngoscopy  Date/Time: 11/26/2019 7:51 PM Performed by: Jay Nunnery, MD Authorized by: Jay Nunnery, MD   Consent:    Consent obtained:  Verbal   Consent given by:  Patient Procedure details:    Indications: assessment of airway  Medication:  Afrin   Instrument: flexible fiberoptic laryngoscope     Scope location: right nare   Septum:    normal   Sinus:    Right middle meatus: normal     Left middle meatus: normal   Mouth:    Oropharynx: normal     Vallecula: normal     Base of tongue: normal     Epiglottis: normal   Throat:    Pyriform sinus: normal     False vocal cords: normal     True vocal cords: normal   Comments:     Clear upper airway examination on fiberoptic laryngoscopy.  Mild arytenoid edema consistent with probable laryngeal pharyngeal reflux.    Assessment: Chronic rhinitis with chronic postnasal drainage. 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  Saline nasal spray during the day and suggested trying Xlear saline rinse.  Also suggested using Mucinex once or twice a day. Prescribed omeprazole 40 mg daily before dinner for the next 2 months. He will follow-up for recheck if he notices any worsening of the symptoms. Reassured him of normal upper airway examination and postnasal drainage is a normal function of the nasal passageway and medical therapy would be beneficial.   Radene Journey, MD   CC:

## 2019-12-02 ENCOUNTER — Encounter (INDEPENDENT_AMBULATORY_CARE_PROVIDER_SITE_OTHER): Payer: Medicare Other | Admitting: Ophthalmology

## 2019-12-02 ENCOUNTER — Other Ambulatory Visit: Payer: Self-pay

## 2019-12-02 DIAGNOSIS — H353122 Nonexudative age-related macular degeneration, left eye, intermediate dry stage: Secondary | ICD-10-CM

## 2019-12-02 DIAGNOSIS — H43813 Vitreous degeneration, bilateral: Secondary | ICD-10-CM

## 2019-12-02 DIAGNOSIS — H35033 Hypertensive retinopathy, bilateral: Secondary | ICD-10-CM | POA: Diagnosis not present

## 2019-12-02 DIAGNOSIS — I10 Essential (primary) hypertension: Secondary | ICD-10-CM

## 2019-12-02 DIAGNOSIS — H353211 Exudative age-related macular degeneration, right eye, with active choroidal neovascularization: Secondary | ICD-10-CM | POA: Diagnosis not present

## 2019-12-06 ENCOUNTER — Telehealth (INDEPENDENT_AMBULATORY_CARE_PROVIDER_SITE_OTHER): Payer: Self-pay

## 2019-12-09 ENCOUNTER — Telehealth (INDEPENDENT_AMBULATORY_CARE_PROVIDER_SITE_OTHER): Payer: Self-pay | Admitting: Otolaryngology

## 2019-12-30 ENCOUNTER — Encounter (INDEPENDENT_AMBULATORY_CARE_PROVIDER_SITE_OTHER): Payer: Medicare Other | Admitting: Ophthalmology

## 2019-12-30 DIAGNOSIS — I10 Essential (primary) hypertension: Secondary | ICD-10-CM

## 2019-12-30 DIAGNOSIS — H35033 Hypertensive retinopathy, bilateral: Secondary | ICD-10-CM | POA: Diagnosis not present

## 2019-12-30 DIAGNOSIS — H353211 Exudative age-related macular degeneration, right eye, with active choroidal neovascularization: Secondary | ICD-10-CM | POA: Diagnosis not present

## 2019-12-30 DIAGNOSIS — H43813 Vitreous degeneration, bilateral: Secondary | ICD-10-CM

## 2019-12-30 DIAGNOSIS — H353122 Nonexudative age-related macular degeneration, left eye, intermediate dry stage: Secondary | ICD-10-CM | POA: Diagnosis not present

## 2020-01-27 ENCOUNTER — Encounter (INDEPENDENT_AMBULATORY_CARE_PROVIDER_SITE_OTHER): Payer: Medicare Other | Admitting: Ophthalmology

## 2020-01-27 DIAGNOSIS — H35033 Hypertensive retinopathy, bilateral: Secondary | ICD-10-CM

## 2020-01-27 DIAGNOSIS — H353122 Nonexudative age-related macular degeneration, left eye, intermediate dry stage: Secondary | ICD-10-CM

## 2020-01-27 DIAGNOSIS — H353211 Exudative age-related macular degeneration, right eye, with active choroidal neovascularization: Secondary | ICD-10-CM | POA: Diagnosis not present

## 2020-01-27 DIAGNOSIS — I10 Essential (primary) hypertension: Secondary | ICD-10-CM | POA: Diagnosis not present

## 2020-01-27 DIAGNOSIS — H43813 Vitreous degeneration, bilateral: Secondary | ICD-10-CM

## 2020-02-04 ENCOUNTER — Encounter (HOSPITAL_COMMUNITY): Payer: Self-pay | Admitting: Emergency Medicine

## 2020-02-04 ENCOUNTER — Emergency Department (HOSPITAL_COMMUNITY): Payer: Medicare Other

## 2020-02-04 ENCOUNTER — Inpatient Hospital Stay (HOSPITAL_COMMUNITY)
Admission: EM | Admit: 2020-02-04 | Discharge: 2020-02-08 | DRG: 983 | Disposition: A | Discharge status: Home Health | Payer: Medicare Other | Attending: Internal Medicine | Admitting: Internal Medicine

## 2020-02-04 ENCOUNTER — Other Ambulatory Visit: Payer: Self-pay

## 2020-02-04 ENCOUNTER — Encounter (INDEPENDENT_AMBULATORY_CARE_PROVIDER_SITE_OTHER): Payer: Medicare Other | Admitting: Ophthalmology

## 2020-02-04 DIAGNOSIS — Z79899 Other long term (current) drug therapy: Secondary | ICD-10-CM | POA: Diagnosis not present

## 2020-02-04 DIAGNOSIS — M48061 Spinal stenosis, lumbar region without neurogenic claudication: Secondary | ICD-10-CM | POA: Diagnosis present

## 2020-02-04 DIAGNOSIS — I6521 Occlusion and stenosis of right carotid artery: Secondary | ICD-10-CM | POA: Diagnosis present

## 2020-02-04 DIAGNOSIS — G8929 Other chronic pain: Secondary | ICD-10-CM | POA: Diagnosis present

## 2020-02-04 DIAGNOSIS — H3411 Central retinal artery occlusion, right eye: Secondary | ICD-10-CM | POA: Diagnosis present

## 2020-02-04 DIAGNOSIS — N400 Enlarged prostate without lower urinary tract symptoms: Secondary | ICD-10-CM | POA: Diagnosis present

## 2020-02-04 DIAGNOSIS — H353211 Exudative age-related macular degeneration, right eye, with active choroidal neovascularization: Secondary | ICD-10-CM | POA: Diagnosis not present

## 2020-02-04 DIAGNOSIS — I10 Essential (primary) hypertension: Secondary | ICD-10-CM | POA: Diagnosis present

## 2020-02-04 DIAGNOSIS — E7800 Pure hypercholesterolemia, unspecified: Secondary | ICD-10-CM | POA: Diagnosis present

## 2020-02-04 DIAGNOSIS — Z86718 Personal history of other venous thrombosis and embolism: Secondary | ICD-10-CM

## 2020-02-04 DIAGNOSIS — N401 Enlarged prostate with lower urinary tract symptoms: Secondary | ICD-10-CM

## 2020-02-04 DIAGNOSIS — Z981 Arthrodesis status: Secondary | ICD-10-CM

## 2020-02-04 DIAGNOSIS — H3530 Unspecified macular degeneration: Secondary | ICD-10-CM | POA: Diagnosis present

## 2020-02-04 DIAGNOSIS — I6529 Occlusion and stenosis of unspecified carotid artery: Secondary | ICD-10-CM

## 2020-02-04 DIAGNOSIS — H35033 Hypertensive retinopathy, bilateral: Secondary | ICD-10-CM | POA: Diagnosis not present

## 2020-02-04 DIAGNOSIS — J302 Other seasonal allergic rhinitis: Secondary | ICD-10-CM | POA: Diagnosis present

## 2020-02-04 DIAGNOSIS — M549 Dorsalgia, unspecified: Secondary | ICD-10-CM | POA: Diagnosis present

## 2020-02-04 DIAGNOSIS — H43813 Vitreous degeneration, bilateral: Secondary | ICD-10-CM

## 2020-02-04 DIAGNOSIS — E785 Hyperlipidemia, unspecified: Secondary | ICD-10-CM | POA: Diagnosis present

## 2020-02-04 DIAGNOSIS — M5126 Other intervertebral disc displacement, lumbar region: Secondary | ICD-10-CM | POA: Diagnosis present

## 2020-02-04 DIAGNOSIS — Z823 Family history of stroke: Secondary | ICD-10-CM | POA: Diagnosis not present

## 2020-02-04 DIAGNOSIS — K219 Gastro-esophageal reflux disease without esophagitis: Secondary | ICD-10-CM | POA: Diagnosis present

## 2020-02-04 DIAGNOSIS — I739 Peripheral vascular disease, unspecified: Secondary | ICD-10-CM | POA: Diagnosis present

## 2020-02-04 DIAGNOSIS — Z20822 Contact with and (suspected) exposure to covid-19: Secondary | ICD-10-CM | POA: Diagnosis present

## 2020-02-04 DIAGNOSIS — Z87891 Personal history of nicotine dependence: Secondary | ICD-10-CM

## 2020-02-04 DIAGNOSIS — H5461 Unqualified visual loss, right eye, normal vision left eye: Secondary | ICD-10-CM | POA: Diagnosis present

## 2020-02-04 DIAGNOSIS — H5340 Unspecified visual field defects: Secondary | ICD-10-CM | POA: Diagnosis present

## 2020-02-04 DIAGNOSIS — M545 Low back pain: Secondary | ICD-10-CM

## 2020-02-04 DIAGNOSIS — I351 Nonrheumatic aortic (valve) insufficiency: Secondary | ICD-10-CM | POA: Diagnosis not present

## 2020-02-04 DIAGNOSIS — H353122 Nonexudative age-related macular degeneration, left eye, intermediate dry stage: Secondary | ICD-10-CM

## 2020-02-04 LAB — COMPREHENSIVE METABOLIC PANEL
ALT: 5 U/L (ref 0–44)
AST: 22 U/L (ref 15–41)
Albumin: 4.2 g/dL (ref 3.5–5.0)
Alkaline Phosphatase: 53 U/L (ref 38–126)
Anion gap: 12 (ref 5–15)
BUN: 12 mg/dL (ref 8–23)
CO2: 24 mmol/L (ref 22–32)
Calcium: 9 mg/dL (ref 8.9–10.3)
Chloride: 98 mmol/L (ref 98–111)
Creatinine, Ser: 0.95 mg/dL (ref 0.61–1.24)
GFR calc Af Amer: >60 mL/min (ref >60)
GFR calc non Af Amer: >60 mL/min (ref >60)
Glucose, Bld: 105 mg/dL — ABNORMAL HIGH (ref 70–99)
Potassium: 3.6 mmol/L (ref 3.5–5.1)
Sodium: 134 mmol/L — ABNORMAL LOW (ref 135–145)
Total Bilirubin: 1.3 mg/dL — ABNORMAL HIGH (ref 0.3–1.2)
Total Protein: 7.1 g/dL (ref 6.5–8.1)

## 2020-02-04 LAB — CBC
HCT: 43.3 % (ref 39.0–52.0)
Hemoglobin: 15 g/dL (ref 13.0–17.0)
MCH: 32.8 pg (ref 26.0–34.0)
MCHC: 34.6 g/dL (ref 30.0–36.0)
MCV: 94.5 fL (ref 80.0–100.0)
Platelets: 320 10*3/uL (ref 150–400)
RBC: 4.58 MIL/uL (ref 4.22–5.81)
RDW: 12.3 % (ref 11.5–15.5)
WBC: 7.2 10*3/uL (ref 4.0–10.5)
nRBC: 0 % (ref 0.0–0.2)

## 2020-02-04 LAB — APTT: aPTT: 40 seconds — ABNORMAL HIGH (ref 24–36)

## 2020-02-04 LAB — SARS CORONAVIRUS 2 (TAT 6-24 HRS): SARS Coronavirus 2: NEGATIVE

## 2020-02-04 LAB — DIFFERENTIAL
Abs Immature Granulocytes: 0.02 10*3/uL (ref 0.00–0.07)
Basophils Absolute: 0.1 10*3/uL (ref 0.0–0.1)
Basophils Relative: 1 %
Eosinophils Absolute: 0.2 10*3/uL (ref 0.0–0.5)
Eosinophils Relative: 3 %
Immature Granulocytes: 0 %
Lymphocytes Relative: 14 %
Lymphs Abs: 1 10*3/uL (ref 0.7–4.0)
Monocytes Absolute: 0.9 10*3/uL (ref 0.1–1.0)
Monocytes Relative: 12 %
Neutro Abs: 5.1 10*3/uL (ref 1.7–7.7)
Neutrophils Relative %: 70 %

## 2020-02-04 LAB — I-STAT CHEM 8, ED
BUN: 16 mg/dL (ref 8–23)
Calcium, Ion: 1.06 mmol/L — ABNORMAL LOW (ref 1.15–1.40)
Chloride: 95 mmol/L — ABNORMAL LOW (ref 98–111)
Creatinine, Ser: 0.9 mg/dL (ref 0.61–1.24)
Glucose, Bld: 99 mg/dL (ref 70–99)
HCT: 43 % (ref 39.0–52.0)
Hemoglobin: 14.6 g/dL (ref 13.0–17.0)
Potassium: 3.4 mmol/L — ABNORMAL LOW (ref 3.5–5.1)
Sodium: 132 mmol/L — ABNORMAL LOW (ref 135–145)
TCO2: 29 mmol/L (ref 22–32)

## 2020-02-04 LAB — PROTIME-INR
INR: 1 (ref 0.8–1.2)
Prothrombin Time: 12.6 seconds (ref 11.4–15.2)

## 2020-02-04 LAB — CK: Total CK: 225 U/L (ref 49–397)

## 2020-02-04 MED ORDER — ASPIRIN 81 MG PO CHEW
324.0000 mg | CHEWABLE_TABLET | Freq: Every day | ORAL | Status: DC
  Filled 2020-02-04: qty 4

## 2020-02-04 MED ORDER — ACETAMINOPHEN 325 MG PO TABS: 650.0000 mg | ORAL_TABLET | ORAL | Status: DC | PRN

## 2020-02-04 MED ORDER — ACETAMINOPHEN 650 MG RE SUPP: 650.0000 mg | RECTAL | Status: DC | PRN

## 2020-02-04 MED ORDER — ATORVASTATIN CALCIUM 40 MG PO TABS
40.0000 mg | ORAL_TABLET | Freq: Every day | ORAL | Status: DC
  Administered 2020-02-05 – 2020-02-07 (×3): 40 mg via ORAL
  Filled 2020-02-04 (×3): qty 1

## 2020-02-04 MED ORDER — IOHEXOL 350 MG/ML SOLN
100.0000 mL | Freq: Once | INTRAVENOUS | Status: AC | PRN
  Administered 2020-02-04: 100 mL via INTRAVENOUS

## 2020-02-04 MED ORDER — ASPIRIN 81 MG PO CHEW
324.0000 mg | CHEWABLE_TABLET | Freq: Once | ORAL | Status: AC
  Administered 2020-02-05: 324 mg via ORAL
  Filled 2020-02-04: qty 4

## 2020-02-04 MED ORDER — ENOXAPARIN SODIUM 40 MG/0.4ML ~~LOC~~ SOLN
40.0000 mg | SUBCUTANEOUS | Status: DC
  Administered 2020-02-05 – 2020-02-06 (×2): 40 mg via SUBCUTANEOUS
  Filled 2020-02-04 (×2): qty 0.4

## 2020-02-04 MED ORDER — STROKE: EARLY STAGES OF RECOVERY BOOK: Freq: Once | Status: DC

## 2020-02-04 MED ORDER — ACETAMINOPHEN 160 MG/5ML PO SOLN: 650.0000 mg | ORAL | Status: DC | PRN

## 2020-02-04 NOTE — ED Triage Notes (Signed)
Patient sent from [facility] for an acute central retinal arterial occlusion. Paperwork states Dr. Zigmund Daniel performed an ocular paracentesis OD today and needs patient to have stroke protocol. Patient states he woke up this morning about 7am and is completely blind out of the right eye. Patient went to bed about 9pm. No neuro symptoms noted in triage. C/o chronic back pain and right left leg from a bugling disc.

## 2020-02-04 NOTE — ED Notes (Signed)
Pt transported to MRI 

## 2020-02-04 NOTE — ED Provider Notes (Signed)
[facility] EMERGENCY DEPARTMENT Provider Note   CSN: [id number] Arrival date & time: 02/04/20  1208     History No chief complaint on file.   Jay Tran is a 86 y.o. male.  The history is provided by the patient. No language interpreter was used.  Eye Problem Location:  Right eye Severity:  Severe Onset quality:  Sudden Duration:  18 hours Timing:  Constant Progression:  Worsening Chronicity:  New Relieved by:  Nothing Worsened by:  Nothing Associated symptoms: decreased vision   Pt went to be at 9:30 last pm.  Pt reports he could not seen out of right eye when he woke up this am at 7:30.  Pt went to see Dr. Zigmund Daniel Opthalmology who evaluated the pt and sent him here for acute central retinal artery occlusion      Past Medical History:  Diagnosis Date  . Arthritis   . BPH (benign prostatic hypertrophy)   . 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 GERD (gastroesophageal reflux disease)   . Hearing aid worn    Hx: of right ear  . Hyperlipemia   . Hypertension   . Lumbar herniated disc   . Lumbar stenosis   . PONV (postoperative nausea and vomiting)   . Seasonal allergies    Hx: of    Patient Active Problem List   Diagnosis Date Noted  . Wound drainage 08/20/2018  . Chronic pain of both shoulders 02/22/2017  . S/P lumbar spinal fusion 04/15/2015  . HNP (herniated nucleus pulposus), lumbar 10/26/2012    Class: Diagnosis of    Past Surgical History:  Procedure Laterality Date  . COLONOSCOPY     Hx: of  . EYE SURGERY     bil catarcts 02/2016  . HERNIA REPAIR    . LUMBAR LAMINECTOMY  10/26/2012   Procedure: MICRODISCECTOMY LUMBAR LAMINECTOMY;  Surgeon: Marybelle Killings, MD;  Location: [place];  Service: Orthopedics;  Laterality: Right;  Right L4-5 Microdiscectomy  . MAXIMUM ACCESS (MAS)POSTERIOR LUMBAR INTERBODY FUSION (PLIF) 1 LEVEL N/A 04/15/2015   Procedure: Maximum Access Surgery Posterior Lumbar Interbody Fusion  Lumbar two-Lumbar three extension of hardware;  Surgeon: Eustace Moore, MD;  Location: [place] [facility];  Service: Neurosurgery;  Laterality: N/A;  . ROTATOR CUFF REPAIR     right shoulder - 3 times  . TONSILLECTOMY    . TRANSURETHRAL RESECTION OF BLADDER TUMOR N/A 02/24/2017   Procedure: TRANSURETHRAL RESECTION OF BLADDER TUMOR (TURBT);  Surgeon: Alexis Frock, MD;  Location: [facility];  Service: Urology;  Laterality: N/A;       Family History  Problem Relation Age of Onset  . Stroke Mother   . Cancer - Prostate Father     Social History   Tobacco Use  . Smoking status: Former Smoker    Quit date: 11/12/1963    Years since quitting: 56.2  . Smokeless tobacco: Never Used  Substance Use Topics  . Alcohol use: No  . Drug use: No    Home Medications Prior to Admission medications   Medication Sig Start Date End Date Taking? Authorizing Provider  azelastine (ASTELIN) 0.1 % nasal spray Place 2 sprays into both nostrils 2 (two) times daily. Use in each nostril as directed 07/04/19   Kennith Gain, MD  finasteride (PROSCAR) 5 MG tablet Take 5 mg by mouth every evening.     [provider]  hydrochlorothiazide (HYDRODIURIL) 50 MG tablet Take 50 mg  by mouth daily.     [provider]  Liniments (SALONPAS PAIN RELIEF PATCH EX) Place 1 patch onto the skin daily as needed (for pain.).    [provider]  lisinopril (PRINIVIL,ZESTRIL) 20 MG tablet Take 20 mg by mouth daily.     [provider]  mometasone (NASONEX) 50 MCG/ACT nasal spray Place 2 sprays into the nose daily. 11/26/19   Rozetta Nunnery, MD  Multiple Vitamins-Minerals (PRESERVISION AREDS 2 PO) Take 1 tablet by mouth 2 (two) times daily.     [provider]  Omega-3 Fatty Acids (FISH OIL) 1200 MG CAPS Take 1,200 mg by mouth daily.    [provider]  omeprazole (PRILOSEC) 40 MG capsule Take 1 capsule (40 mg total) by mouth daily. 11/26/19   Rozetta Nunnery, MD    ranitidine (ZANTAC) 150 MG tablet Take 150 mg by mouth daily as needed for heartburn.     [provider]  SYSTANE 0.4-0.3 % SOLN Place 1 drop into both eyes 3 (three) times daily as needed (dry eyes).    [provider]  Tamsulosin HCl (FLOMAX) 0.4 MG CAPS Take 0.4 mg by mouth every evening.     [provider]  tiZANidine (ZANAFLEX) 4 MG tablet Take 1 tablet (4 mg total) by mouth every 8 (eight) hours as needed for muscle spasms. 08/11/18   Eustace Moore, MD  traMADol (ULTRAM) 50 MG tablet Take 50 mg by mouth every 6 (six) hours as needed.    [provider]  Turmeric (QC TUMERIC COMPLEX PO) Take by mouth.    [provider]    Allergies    Patient has no known allergies.  Review of Systems   Review of Systems  Eyes: Positive for visual disturbance.  All other systems reviewed and are negative.   Physical Exam Updated Vital Signs BP (!) 142/71 (BP Location: Right Arm)   Pulse 69   Temp 98.7 F (37.1 C) (Oral)   Resp 17   SpO2 97%   Physical Exam Vitals and nursing note reviewed.  Constitutional:      Appearance: He is well-developed and normal weight.  HENT:     Head: Normocephalic and atraumatic.     Nose: Nose normal.     Mouth/Throat:     Mouth: Mucous membranes are moist.  Eyes:     Extraocular Movements: Extraocular movements intact.     Conjunctiva/sclera: Conjunctivae normal.     Comments: No vision left eye   Cardiovascular:     Rate and Rhythm: Normal rate and regular rhythm.     Heart sounds: No murmur.  Pulmonary:     Effort: Pulmonary effort is normal. No respiratory distress.     Breath sounds: Normal breath sounds.  Abdominal:     Palpations: Abdomen is soft.     Tenderness: There is no abdominal tenderness.  Musculoskeletal:        General: Normal range of motion.     Cervical back: Neck supple.  Skin:    General: Skin is warm and dry.  Neurological:     General: No focal deficit present.     Mental  Status: He is alert.  Psychiatric:        Mood and Affect: Mood normal.     ED Results / Procedures / Treatments   Labs (all labs ordered are listed, but only abnormal results are displayed) Labs Reviewed  APTT - Abnormal; Notable for the following components:  Result Value   aPTT 40 (*)    All other components within normal limits  COMPREHENSIVE METABOLIC PANEL - Abnormal; Notable for the following components:   Sodium 134 (*)    Glucose, Bld 105 (*)    Total Bilirubin 1.3 (*)    All other components within normal limits  I-STAT CHEM 8, ED - Abnormal; Notable for the following components:   Sodium 132 (*)    Potassium 3.4 (*)    Chloride 95 (*)    Calcium, Ion 1.06 (*)    All other components within normal limits  PROTIME-INR  CBC  DIFFERENTIAL    EKG None  Radiology No results found.  Procedures .Critical Care Performed by: Fransico Meadow, PA-C Authorized by: Fransico Meadow, PA-C   Critical care provider statement:    Critical care time (minutes):  45   Critical care start time:  02/04/2020 2:30 PM   Critical care end time:  02/04/2020 6:28 PM   Critical care was necessary to treat or prevent imminent or life-threatening deterioration of the following conditions:  CNS failure or compromise   Critical care was time spent personally by me on the following activities:  Discussions with consultants, evaluation of patient's response to treatment, examination of patient, ordering and performing treatments and interventions, ordering and review of laboratory studies, ordering and review of radiographic studies, pulse oximetry, re-evaluation of patient's condition, obtaining history from patient or surrogate and review of old charts   (including critical care time)  Medications Ordered in ED Medications - No data to display  ED Course  I have reviewed the triage vital signs and the nursing notes.  Pertinent labs & imaging results that were available during my  care of the patient were reviewed by me and considered in my medical decision making (see chart for details).    MDM Rules/Calculators/A&P                      MDM  Ct scan reviewed.  I spoke with Dr. Leonel Ramsay neurology who will evaluated.  He advised admission to medicine.  Hospitalist will see and admit Covid pending Final Clinical Impression(s) / ED Diagnoses Final diagnoses:  Central retinal artery occlusion of right eye    Rx / DC Orders ED Discharge Orders    None       Sidney Ace 02/04/20 1829    Drenda Freeze, MD 02/05/20 [phone]

## 2020-02-04 NOTE — Consult Note (Signed)
Neurology Consultation  Reason for Consult: Central retinal artery occlusion  Referring Physician: Dr. Darl Householder  CC: Blindness in the right eye  History is obtained from: Patient  HPI: Jay Tran is an 86 y.o. male with a history of lumbar stenosis, hypertension, hyperlipidemia, DVT of lower extremity.  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    Patient states he went to sleep at 9:30 PM last night and he was normal.  Upon waking at 7:30 AM he noted he was completely blind in his right eye.  He immediately called his Ophthalmologist who noted a central retinal artery occlusion.  Patient was immediately sent to the hospital to be evaluated for stroke.  Currently patient is comfortable with no eye pain.  He has no vision out of his right eye except for a thin rim of light along the far lateral periphery temporally.  Bilateral pupils are still slightly dilated and sluggish at time of PA exam, followed by right RAPD seen on follow up attending exam.  ED course  CT head shows-no acute intracranial pathology.  Chronic microvascular disease and cerebral atrophy  Chart review (none)  LKW: 9:30 PM on 02/03/2020 tpa given?: no, out of window Premorbid modified Rankin scale (mRS): 0 NIH stroke scale 0   Past Medical History:  Diagnosis Date  . Arthritis   . BPH (benign prostatic hypertrophy)   . 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 GERD (gastroesophageal reflux disease)   . Hearing aid worn    Hx: of right ear  . Hyperlipemia   . Hypertension   . Lumbar herniated disc   . Lumbar stenosis   . PONV (postoperative nausea and vomiting)   . Seasonal allergies    Hx: of    Family History  Problem Relation Age of Onset  . Stroke Mother   . Cancer - Prostate Father    Social History:   reports that he quit smoking about 56 years ago. He has never used smokeless tobacco. He  reports that he does not drink alcohol or use drugs.  Medications No current facility-administered medications for this encounter.  Current Outpatient Medications:  .  azelastine (ASTELIN) 0.1 % nasal spray, Place 2 sprays into both nostrils 2 (two) times daily. Use in each nostril as directed, Disp: 30 mL, Rfl: 5 .  finasteride (PROSCAR) 5 MG tablet, Take 5 mg by mouth every evening. , Disp: , Rfl:  .  hydrochlorothiazide (HYDRODIURIL) 50 MG tablet, Take 50 mg by mouth daily. , Disp: , Rfl:  .  Liniments (SALONPAS PAIN RELIEF PATCH EX), Place 1 patch onto the skin daily as needed (for pain.)., Disp: , Rfl:  .  lisinopril (PRINIVIL,ZESTRIL) 20 MG tablet, Take 20 mg by mouth daily. , Disp: , Rfl:  .  mometasone (NASONEX) 50 MCG/ACT nasal spray, Place 2 sprays into the nose daily., Disp: 17 g, Rfl: 12 .  Multiple Vitamins-Minerals (PRESERVISION AREDS 2 PO), Take 1 tablet by mouth 2 (two) times daily. , Disp: , Rfl:  .  Omega-3 Fatty Acids (FISH OIL) 1200 MG CAPS, Take 1,200 mg by mouth daily., Disp: , Rfl:  .  omeprazole (PRILOSEC) 40 MG capsule, Take 1 capsule (40 mg total) by mouth daily., Disp: 30 capsule, Rfl: 3 .  ranitidine (ZANTAC) 150 MG tablet, Take 150 mg by mouth daily as needed for heartburn. , Disp: ,  Rfl:  .  SYSTANE 0.4-0.3 % SOLN, Place 1 drop into both eyes 3 (three) times daily as needed (dry eyes)., Disp: , Rfl:  .  Tamsulosin HCl (FLOMAX) 0.4 MG CAPS, Take 0.4 mg by mouth every evening. , Disp: , Rfl:  .  tiZANidine (ZANAFLEX) 4 MG tablet, Take 1 tablet (4 mg total) by mouth every 8 (eight) hours as needed for muscle spasms., Disp: 60 tablet, Rfl: 1 .  traMADol (ULTRAM) 50 MG tablet, Take 50 mg by mouth every 6 (six) hours as needed., Disp: , Rfl:  .  Turmeric (QC TUMERIC COMPLEX PO), Take by mouth., Disp: , Rfl:   ROS:    General ROS: negative for - chills, fatigue, fever, night sweats, weight gain or weight loss Psychological ROS: negative for - behavioral disorder,  hallucinations, memory difficulties, mood swings or suicidal ideation Ophthalmic ROS: Positive for -loss of vision in the right eye ENT ROS: negative for - epistaxis, nasal discharge, oral lesions, sore throat, tinnitus or vertigo Allergy and Immunology ROS: negative for - hives or itchy/watery eyes Hematological and Lymphatic ROS: negative for - bleeding problems, bruising or swollen lymph nodes Endocrine ROS: negative for - galactorrhea, hair pattern changes, polydipsia/polyuria or temperature intolerance Respiratory ROS: negative for - cough, hemoptysis, shortness of breath or wheezing Cardiovascular ROS: negative for - chest pain, dyspnea on exertion, edema or irregular heartbeat Gastrointestinal ROS: negative for - abdominal pain, diarrhea, hematemesis, nausea/vomiting or stool incontinence Genito-Urinary ROS: negative for - dysuria, hematuria, incontinence or urinary frequency/urgency Musculoskeletal ROS: negative for - joint swelling or muscular weakness Neurological ROS: as noted in HPI Dermatological ROS: negative for rash and skin lesion changes  Exam: Current vital signs: BP (!) 180/83   Pulse 69   Temp 98.7 F (37.1 C) (Oral)   Resp 17   SpO2 98%  Vital signs in last 24 hours: Temp:  [98.7 F (37.1 C)] 98.7 F (37.1 C) (02/16 1212) Pulse Rate:  [69] 69 (02/16 1212) Resp:  [17] 17 (02/16 1212) BP: (142-180)/(71-83) 180/83 (02/16 1630) SpO2:  [97 %-98 %] 98 % (02/16 1715)   Constitutional: Appears well-developed and well-nourished.  Eyes: No scleral injection HENT: No OP obstrucion Head: Normocephalic.  Cardiovascular: Normal rate and regular rhythm.  Respiratory: Effort normal, non-labored breathing GI: Soft.  No distension. There is no tenderness.  Skin: WDI  Neuro: Mental Status: Patient is awake, alert, oriented to person, place, month, year, and situation. Speech-normal naming, repeating, comprehension Patient is able to give a clear and coherent  history. Cranial Nerves: II: Visual Fields are full in left eye. Completely blind in right eye. Right sided RAPD is noted. Normal baseline visual acuity OS. Optic nerve head and adjacent retina normal on funduscopic exam OS. Optic nerve head and adjacent retina show decreased arterial caliber OD.  III,IV, VI: EOMI without ptosis or diplopia. No nystagmus.  V: Facial sensation is symmetric to temperature VII: Facial movement is symmetric.  VIII: hearing is intact to voice X: Palate elevates symmetrically XI: Shoulder shrug is symmetric. XII: tongue is midline without atrophy or fasciculations.  Motor: Tone is normal. Bulk is normal. 5/5 strength was present in all four extremities.  Drift none Postural tremor Sensory: Sensation is symmetric to light touch and temperature in the arms and legs. DSS intact Deep Tendon Reflexes: 2+ and symmetric in the biceps and patellae.  Plantars: Toes are downgoing bilaterally.  Cerebellar: FNF and HKS are intact bilaterally  Labs I have reviewed labs in epic and the  results pertinent to this consultation are:   CBC    Component Value Date/Time   WBC 7.2 02/04/2020 1227   RBC 4.58 02/04/2020 1227   HGB 14.6 02/04/2020 1326   HCT 43.0 02/04/2020 1326   PLT 320 02/04/2020 1227   MCV 94.5 02/04/2020 1227   MCH 32.8 02/04/2020 1227   MCHC 34.6 02/04/2020 1227   RDW 12.3 02/04/2020 1227   LYMPHSABS 1.0 02/04/2020 1227   MONOABS 0.9 02/04/2020 1227   EOSABS 0.2 02/04/2020 1227   BASOSABS 0.1 02/04/2020 1227    CMP     Component Value Date/Time   NA 132 (L) 02/04/2020 1326   K 3.4 (L) 02/04/2020 1326   CL 95 (L) 02/04/2020 1326   CO2 24 02/04/2020 1227   GLUCOSE 99 02/04/2020 1326   BUN 16 02/04/2020 1326   CREATININE 0.90 02/04/2020 1326   CALCIUM 9.0 02/04/2020 1227   PROT 7.1 02/04/2020 1227   ALBUMIN 4.2 02/04/2020 1227   AST 22 02/04/2020 1227   ALT 5 02/04/2020 1227   ALKPHOS 53 02/04/2020 1227   BILITOT 1.3 (H) 02/04/2020  1227   GFRNONAA >60 02/04/2020 1227   GFRAA >60 02/04/2020 1227    Lipid Panel  No results found for: CHOL, TRIG, HDL, CHOLHDL, VLDL, LDLCALC, LDLDIRECT   Imaging I have reviewed the images obtained:  CT-scan of the brain--no acute intracranial pathology  MRI brain-Pending  CTA of head and neck-Pending  Etta Quill PA-C [facility] [phone] 02/04/2020, 5:28 PM     Assessment:  86 year old male presenting to the hospital after losing eyesight in his right eye.  Patient went to see an Ophthalmologist who diagnosed him with a right CRAO.   1. Current exam reveals right RAPD, absent light perception OD and small caliber arteries OD on funduscopic exam. No other Neurological abnormalities seen.  2. Patient will need stroke work-up.  Recommendations -MRI of the brain without contrast -CTA head and neck -Transthoracic Echo,  -Start patient on ASA 325 mg daily,  -Start atorvastatin 40 mg po qd. Obtain baseline CK level.  -BP goal: modified permissive HTN protocol for 24 hours. Treat if SBP > 180 -HBAIC and Lipid profile -Telemetry monitoring -Frequent neuro checks -NPO until passes stroke swallow screen -PT/OT  # please page stroke NP  Or  PA  Or MD from 8am -4 pm  as this patient from this time will be  followed by the stroke.   You can look them up on [email or web address]  Password [email or web address]  I have seen and examined the patient. I have formulated the assessment and recommendations. 86 year old male with right CRAO. Exam findings include right RAPD. Will need stroke work up. Start ASA and atorvastatin.  Electronically signed: Dr. Kerney Elbe

## 2020-02-04 NOTE — ED Notes (Signed)
MRI called and should be getting pt in about an hour.

## 2020-02-05 ENCOUNTER — Inpatient Hospital Stay (HOSPITAL_COMMUNITY): Payer: Medicare Other

## 2020-02-05 ENCOUNTER — Encounter (INDEPENDENT_AMBULATORY_CARE_PROVIDER_SITE_OTHER): Payer: Medicare Other | Admitting: Ophthalmology

## 2020-02-05 DIAGNOSIS — K219 Gastro-esophageal reflux disease without esophagitis: Secondary | ICD-10-CM

## 2020-02-05 DIAGNOSIS — H5461 Unqualified visual loss, right eye, normal vision left eye: Secondary | ICD-10-CM

## 2020-02-05 DIAGNOSIS — I351 Nonrheumatic aortic (valve) insufficiency: Secondary | ICD-10-CM

## 2020-02-05 DIAGNOSIS — I6529 Occlusion and stenosis of unspecified carotid artery: Secondary | ICD-10-CM

## 2020-02-05 DIAGNOSIS — G8929 Other chronic pain: Secondary | ICD-10-CM

## 2020-02-05 DIAGNOSIS — N400 Enlarged prostate without lower urinary tract symptoms: Secondary | ICD-10-CM

## 2020-02-05 DIAGNOSIS — N401 Enlarged prostate with lower urinary tract symptoms: Secondary | ICD-10-CM

## 2020-02-05 DIAGNOSIS — I10 Essential (primary) hypertension: Secondary | ICD-10-CM

## 2020-02-05 DIAGNOSIS — M549 Dorsalgia, unspecified: Secondary | ICD-10-CM

## 2020-02-05 DIAGNOSIS — H3411 Central retinal artery occlusion, right eye: Secondary | ICD-10-CM

## 2020-02-05 LAB — LIPID PANEL
Cholesterol: 213 mg/dL — ABNORMAL HIGH (ref 0–200)
HDL: 29 mg/dL — ABNORMAL LOW (ref >40)
LDL Cholesterol: 128 mg/dL — ABNORMAL HIGH (ref 0–99)
Total CHOL/HDL Ratio: 7.3 RATIO
Triglycerides: 280 mg/dL — ABNORMAL HIGH (ref <150)
VLDL: 56 mg/dL — ABNORMAL HIGH (ref 0–40)

## 2020-02-05 LAB — ECHOCARDIOGRAM COMPLETE
Height: 70 in
Weight: 3062.4 oz

## 2020-02-05 LAB — HEMOGLOBIN A1C
Hgb A1c MFr Bld: 5.4 % (ref 4.8–5.6)
Mean Plasma Glucose: 108.28 mg/dL

## 2020-02-05 MED ORDER — TIZANIDINE HCL 4 MG PO TABS
4.0000 mg | ORAL_TABLET | Freq: Every evening | ORAL | Status: DC | PRN
  Administered 2020-02-06: 4 mg via ORAL
  Filled 2020-02-05 (×2): qty 1

## 2020-02-05 MED ORDER — FINASTERIDE 5 MG PO TABS
5.0000 mg | ORAL_TABLET | Freq: Every day | ORAL | Status: DC
  Administered 2020-02-05 – 2020-02-07 (×3): 5 mg via ORAL
  Filled 2020-02-05 (×3): qty 1

## 2020-02-05 MED ORDER — ATORVASTATIN CALCIUM 40 MG PO TABS: 40.0000 mg | ORAL_TABLET | Freq: Every day | ORAL | 0 refills | Status: DC

## 2020-02-05 MED ORDER — ASPIRIN 81 MG PO CHEW: 81.0000 mg | CHEWABLE_TABLET | Freq: Every day | ORAL | 0 refills | Status: AC

## 2020-02-05 MED ORDER — PANTOPRAZOLE SODIUM 40 MG PO TBEC
40.0000 mg | DELAYED_RELEASE_TABLET | Freq: Every day | ORAL | Status: DC
  Administered 2020-02-05 – 2020-02-08 (×3): 40 mg via ORAL
  Filled 2020-02-05 (×3): qty 1

## 2020-02-05 MED ORDER — CLOPIDOGREL BISULFATE 75 MG PO TABS: 75.0000 mg | ORAL_TABLET | Freq: Every day | ORAL | 0 refills | Status: DC

## 2020-02-05 MED ORDER — TAMSULOSIN HCL 0.4 MG PO CAPS
0.4000 mg | ORAL_CAPSULE | Freq: Every day | ORAL | Status: DC
  Administered 2020-02-05 – 2020-02-07 (×3): 0.4 mg via ORAL
  Filled 2020-02-05 (×3): qty 1

## 2020-02-05 MED ORDER — CLOPIDOGREL BISULFATE 75 MG PO TABS
75.0000 mg | ORAL_TABLET | Freq: Every day | ORAL | Status: DC
  Administered 2020-02-05 – 2020-02-06 (×2): 75 mg via ORAL
  Filled 2020-02-05 (×2): qty 1

## 2020-02-05 MED ORDER — TRAMADOL HCL 50 MG PO TABS
50.0000 mg | ORAL_TABLET | Freq: Four times a day (QID) | ORAL | Status: DC | PRN
  Administered 2020-02-07: 50 mg via ORAL
  Filled 2020-02-05: qty 1

## 2020-02-05 MED ORDER — ASPIRIN 81 MG PO CHEW
81.0000 mg | CHEWABLE_TABLET | Freq: Every day | ORAL | Status: DC
  Administered 2020-02-06 – 2020-02-08 (×2): 81 mg via ORAL
  Filled 2020-02-05 (×2): qty 1

## 2020-02-05 MED ORDER — LABETALOL HCL 5 MG/ML IV SOLN
5.0000 mg | INTRAVENOUS | Status: DC | PRN
  Administered 2020-02-07 (×2): 5 mg via INTRAVENOUS

## 2020-02-05 NOTE — Consult Note (Addendum)
Hospital Consult    Reason for Consult:  R eye blindness, carotid stenosis Requesting Physician:  Dr. Pietro Cassis MRN #:  [id number]  History of Present Illness: This is a 86 y.o. male with past medical history significant for hypertension, hyperlipidemia, and chronic back pain with history of 4 back surgeries who presented to ED yesterday with central right eye vision loss.  Ophthamalogist diagnosed central retinal artery occlusion.  Workup included MRI brain negative for CVA.  CTA neck demonstrated irregular R ICA plaque estimated to be 58% stenosis.  Carotid duplex also shows upper end 40-59% stenosis R ICA.  He says his vision has improved and has vision in lateral visual field of right eye.  He denies any further stroke like symptoms including slurring speech or one sided weakness.  He was started on full aspirin and statin by Neurology.  He is a former smoker.  Past Medical History:  Diagnosis Date  . Arthritis   . BPH (benign prostatic hypertrophy)   . 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 GERD (gastroesophageal reflux disease)   . Hearing aid worn    Hx: of right ear  . Hyperlipemia   . Hypertension   . Lumbar herniated disc   . Lumbar stenosis   . PONV (postoperative nausea and vomiting)   . Seasonal allergies    Hx: of    Past Surgical History:  Procedure Laterality Date  . COLONOSCOPY     Hx: of  . EYE SURGERY     bil catarcts 02/2016  . HERNIA REPAIR    . LUMBAR LAMINECTOMY  10/26/2012   Procedure: MICRODISCECTOMY LUMBAR LAMINECTOMY;  Surgeon: Marybelle Killings, MD;  Location: [facility];  Service: Orthopedics;  Laterality: Right;  Right L4-5 Microdiscectomy  . MAXIMUM ACCESS (MAS)POSTERIOR LUMBAR INTERBODY FUSION (PLIF) 1 LEVEL N/A 04/15/2015   Procedure: Maximum Access Surgery Posterior Lumbar Interbody Fusion Lumbar two-Lumbar three extension of hardware;  Surgeon: Eustace Moore, MD;  Location: [facility];  Service: Neurosurgery;  Laterality: N/A;  .  ROTATOR CUFF REPAIR     right shoulder - 3 times  . TONSILLECTOMY    . TRANSURETHRAL RESECTION OF BLADDER TUMOR N/A 02/24/2017   Procedure: TRANSURETHRAL RESECTION OF BLADDER TUMOR (TURBT);  Surgeon: Alexis Frock, MD;  Location: [facility];  Service: Urology;  Laterality: N/A;    No Known Allergies  Prior to Admission medications   Medication Sig Start Date End Date Taking? Authorizing Provider  Chlorphen-Phenyleph-ASA (ALKA-SELTZER PLUS COLD PO) Take 1 capsule by mouth at bedtime.   Yes [provider]  cromolyn (OPTICROM) 4 % ophthalmic solution Place 1 drop into both eyes in the morning, at noon, and at bedtime. 12/29/19  Yes [provider]  finasteride (PROSCAR) 5 MG tablet Take 5 mg by mouth at bedtime.    Yes [provider]  hydrochlorothiazide (HYDRODIURIL) 50 MG tablet Take 50 mg by mouth at bedtime.    Yes [provider]  Liniments (SALONPAS PAIN RELIEF PATCH EX) Place 1 patch onto the skin daily as needed (to affected area for pain).    Yes [provider]  lisinopril (PRINIVIL,ZESTRIL) 20 MG tablet Take 20 mg by mouth at bedtime.    Yes [provider]  Multiple Vitamins-Minerals (PRESERVISION AREDS 2 PO) Take 1 capsule by mouth 2 (two) times daily.    Yes [provider]  naproxen sodium (ALEVE) 220 MG tablet Take 440 mg by mouth daily with breakfast.  Yes [provider]  Omega-3 Fatty Acids (FISH OIL) 1200 MG CAPS Take 1,200 mg by mouth daily with breakfast.    Yes [provider]  omeprazole (PRILOSEC) 40 MG capsule Take 1 capsule (40 mg total) by mouth daily. 11/26/19  Yes Rozetta Nunnery, MD  SYSTANE 0.4-0.3 % SOLN Place 1 drop into both eyes in the morning, at noon, and at bedtime.    Yes [provider]  Tamsulosin HCl (FLOMAX) 0.4 MG CAPS Take 0.4 mg by mouth at bedtime.    Yes [provider]  tiZANidine (ZANAFLEX) 4 MG tablet Take 1 tablet (4 mg total) by mouth every 8  (eight) hours as needed for muscle spasms. Patient taking differently: Take 4 mg by mouth at bedtime as needed for muscle spasms.  08/11/18  Yes Eustace Moore, MD  trimethoprim-polymyxin b Mayra Neer) ophthalmic solution Place 1 drop into the right eye See admin instructions. INSTILL 1 DROP INTO THE RIGHT EYE 4 TIMES DAILY FOR 2 DAYS AFTER EACH MONTHLY EYE INJECTION 10/14/19  Yes [provider]  Turmeric (QC TUMERIC COMPLEX PO) Take 1 capsule by mouth daily with breakfast.    Yes [provider]  azelastine (ASTELIN) 0.1 % nasal spray Place 2 sprays into both nostrils 2 (two) times daily. Use in each nostril as directed Patient not taking: Reported on 02/04/2020 07/04/19   Kennith Gain, MD  mometasone (NASONEX) 50 MCG/ACT nasal spray Place 2 sprays into the nose daily. Patient not taking: Reported on 02/04/2020 11/26/19   Rozetta Nunnery, MD  traMADol (ULTRAM) 50 MG tablet Take 50 mg by mouth every 6 (six) hours as needed (for pain).     [provider]    Social History   Socioeconomic History  . Marital status: Married    Spouse name: Not on file  . Number of children: Not on file  . Years of education: Not on file  . Highest education level: Not on file  Occupational History  . Not on file  Tobacco Use  . Smoking status: Former Smoker    Quit date: 11/12/1963    Years since quitting: 56.2  . Smokeless tobacco: Never Used  Substance and Sexual Activity  . Alcohol use: No  . Drug use: No  . Sexual activity: Yes  Other Topics Concern  . Not on file  Social History Narrative  . Not on file   Social Determinants of Health   Financial Resource Strain:   . Difficulty of Paying Living Expenses: Not on file  Food Insecurity:   . Worried About Charity fundraiser in the Last Year: Not on file  . Ran Out of Food in the Last Year: Not on file  Transportation Needs:   . Lack of Transportation (Medical): Not on file  . Lack of Transportation  (Non-Medical): Not on file  Physical Activity:   . Days of Exercise per Week: Not on file  . Minutes of Exercise per Session: Not on file  Stress:   . Feeling of Stress : Not on file  Social Connections:   . Frequency of Communication with Friends and Family: Not on file  . Frequency of Social Gatherings with Friends and Family: Not on file  . Attends Religious Services: Not on file  . Active Member of Clubs or Organizations: Not on file  . Attends Archivist Meetings: Not on file  . Marital Status: Not on file  Intimate Partner Violence:   . Fear  of Current or Ex-Partner: Not on file  . Emotionally Abused: Not on file  . Physically Abused: Not on file  . Sexually Abused: Not on file     Family History  Problem Relation Age of Onset  . Stroke Mother   . Cancer - Prostate Father     ROS: Otherwise negative unless mentioned in HPI  Physical Examination  Vitals:   02/05/20 1012 02/05/20 1258  BP: (!) 160/74 (!) 165/71  Pulse: 67 75  Resp: 18 18  Temp: 98.3 F (36.8 C) 98.5 F (36.9 C)  SpO2: 93% 95%   Body mass index is 27.46 kg/m.  General:  WDWN in NAD Gait: Not observed HENT: WNL, normocephalic Pulmonary: normal non-labored breathing, without Rales, rhonchi,  wheezing Cardiac: regular Abdomen:  soft, NT/ND, no masses Skin: without rashes Vascular Exam/Pulses: symmetrical radial and DP pulses Extremities: without ischemic changes, without Gangrene , without cellulitis; without open wounds;  Musculoskeletal: no muscle wasting or atrophy  Neurologic: A&O X 3;  R eye with lateral visual field only Psychiatric:  The pt has Normal affect. Lymph:  Unremarkable  CBC    Component Value Date/Time   WBC 7.2 02/04/2020 1227   RBC 4.58 02/04/2020 1227   HGB 14.6 02/04/2020 1326   HCT 43.0 02/04/2020 1326   PLT 320 02/04/2020 1227   MCV 94.5 02/04/2020 1227   MCH 32.8 02/04/2020 1227   MCHC 34.6 02/04/2020 1227   RDW 12.3 02/04/2020 1227   LYMPHSABS  1.0 02/04/2020 1227   MONOABS 0.9 02/04/2020 1227   EOSABS 0.2 02/04/2020 1227   BASOSABS 0.1 02/04/2020 1227    BMET    Component Value Date/Time   NA 132 (L) 02/04/2020 1326   K 3.4 (L) 02/04/2020 1326   CL 95 (L) 02/04/2020 1326   CO2 24 02/04/2020 1227   GLUCOSE 99 02/04/2020 1326   BUN 16 02/04/2020 1326   CREATININE 0.90 02/04/2020 1326   CALCIUM 9.0 02/04/2020 1227   GFRNONAA >60 02/04/2020 1227   GFRAA >60 02/04/2020 1227    COAGS: Lab Results  Component Value Date   INR 1.0 02/04/2020   INR 1.02 08/03/2018   INR 1.01 04/08/2015     Non-Invasive Vascular Imaging:   CTA 58% stenosis R ICA, irregular plaque Carotid duplex upper end 40-59% stenosis R ICA   ASSESSMENT/PLAN: This is a 86 y.o. male with R CRAO, R ICA stenosis  R eye vision improving; has lateral visual field vision now R ICA 58% stenosis by CTA; upper end 40-59% stenosis by duplex With symptomatic lesion >50% stenosis, patient may benefit from revascularization of R ICA Agree with aspirin and statin On call surgeon Dr. Trula Slade will evaluate the patient later today and provide further treatment plans   Dagoberto Ligas PA-C [facility] [phone]  I agree with the above. I have seen and evaluated the patient. Briefly this is an 86 year old male with hypertension and hypercholesterolemia who presented with right eye vision loss which has somewhat improved. CT angiogram shows approximately 60% right carotid stenosis which was confirmed with duplex ultrasound. I discussed that he would need carotid revascularization to prevent future embolic events. I think he is best suited for right carotid endarterectomy. I discussed the risks and benefits of the surgery with the patient and his wife. We discussed the risk of stroke, nerve injury, cardiopulmonary complications and bleeding. All of their questions were answered. I have scheduled him for surgery on Friday. If weather becomes an issue,  we  will proceed next week. The patient would very much like to go home tonight and return for his surgery. We are working on making sure he has appropriate medications to go home with.  Annamarie Major

## 2020-02-05 NOTE — Evaluation (Signed)
Speech Language Pathology Evaluation Patient Details Name: Jay Tran MRN: [id number] DOB: 1934/03/28 Today's Date: 02/05/2020 Time: [id number] SLP Time Calculation (min) (ACUTE ONLY): 25 min  Problem List:  Patient Active Problem List   Diagnosis Date Noted  . Internal carotid artery stenosis 02/05/2020  . Essential hypertension 02/05/2020  . BPH (benign prostatic hyperplasia) 02/05/2020  . GERD (gastroesophageal reflux disease) 02/05/2020  . Chronic back pain 02/05/2020  . Vision loss of right eye 02/04/2020  . Wound drainage 08/20/2018  . Chronic pain of both shoulders 02/22/2017  . S/P lumbar spinal fusion 04/15/2015  . HNP (herniated nucleus pulposus), lumbar 10/26/2012    Class: Diagnosis of   Past Medical History:  Past Medical History:  Diagnosis Date  . Arthritis   . BPH (benign prostatic hypertrophy)   . 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 GERD (gastroesophageal reflux disease)   . Hearing aid worn    Hx: of right ear  . Hyperlipemia   . Hypertension   . Lumbar herniated disc   . Lumbar stenosis   . PONV (postoperative nausea and vomiting)   . Seasonal allergies    Hx: of   Past Surgical History:  Past Surgical History:  Procedure Laterality Date  . COLONOSCOPY     Hx: of  . EYE SURGERY     bil catarcts 02/2016  . HERNIA REPAIR    . LUMBAR LAMINECTOMY  10/26/2012   Procedure: MICRODISCECTOMY LUMBAR LAMINECTOMY;  Surgeon: Marybelle Killings, MD;  Location: [facility];  Service: Orthopedics;  Laterality: Right;  Right L4-5 Microdiscectomy  . MAXIMUM ACCESS (MAS)POSTERIOR LUMBAR INTERBODY FUSION (PLIF) 1 LEVEL N/A 04/15/2015   Procedure: Maximum Access Surgery Posterior Lumbar Interbody Fusion Lumbar two-Lumbar three extension of hardware;  Surgeon: Eustace Moore, MD;  Location: [facility];  Service: Neurosurgery;  Laterality: N/A;  . ROTATOR CUFF REPAIR     right shoulder - 3 times  . TONSILLECTOMY    . TRANSURETHRAL RESECTION OF  BLADDER TUMOR N/A 02/24/2017   Procedure: TRANSURETHRAL RESECTION OF BLADDER TUMOR (TURBT);  Surgeon: Alexis Frock, MD;  Location: [facility];  Service: Urology;  Laterality: N/A;   HPI:  86yo male admitted 02/04/20 with right eye vision loss and chest pain. PMH: Arthritis, BPH, DVT, GERD, right hearing aid, lumbar herniated disc, lumbar stenosis, HTN, HLD, macular degeneration, chronic back pain. MRI negative   Assessment / Plan / Recommendation Clinical Impression  [facility] Mental Status (SLUMS) Examination was administered. Pt scored 25/30, which falls within low range of normal limits for pt level of education (10th grade). Points were lost on delayed recall, thought organization, and digit reversal. Pt and wife report she manages finances and medications, and they share other household responsibilities (cooking, cleaning, shopping, etc). No further ST intervention is recommended at this time. Pt was encouraged to continue using compensatory strategies to maximize functional recall. ST signing off. Please reconsult if needs arise in the future.    SLP Assessment  SLP Recommendation/Assessment: Patient does not need any further Speech Language Pathology Services SLP Visit Diagnosis: Cognitive communication deficit (R41.841)    Follow Up Recommendations  None       SLP Evaluation Cognition  Overall Cognitive Status: Within Functional Limits for tasks assessed Arousal/Alertness: Awake/alert Orientation Level: Oriented X4       Comprehension  Auditory Comprehension Overall Auditory Comprehension: Appears within functional limits for tasks assessed    Expression Expression Primary Mode  of Expression: Verbal Verbal Expression Overall Verbal Expression: Appears within functional limits for tasks assessed Written Expression Dominant Hand: Right   Oral / Motor  Oral Motor/Sensory Function Overall Oral Motor/Sensory Function: Within functional limits Motor Speech Overall  Motor Speech: Appears within functional limits for tasks assessed   GO                    Jay Tran B. Quentin Ore, [facility], [place] Speech Language Pathologist Office: [phone] Pager: [phone]   Jay Tran 02/05/2020, 11:33 AM

## 2020-02-05 NOTE — Plan of Care (Signed)
  Problem: Education: Goal: Knowledge of General Education information will improve Description: Including pain rating scale, medication(s)/side effects and non-pharmacologic comfort measures Outcome: Progressing   Problem: Clinical Measurements: Goal: Respiratory complications will improve Outcome: Progressing Note: On room air    Problem: Activity: Goal: Risk for activity intolerance will decrease Outcome: Progressing Note: Up with one assist and walker   Problem: Nutrition: Goal: Adequate nutrition will be maintained Outcome: Progressing   Problem: Coping: Goal: Level of anxiety will decrease Outcome: Progressing   Problem: Elimination: Goal: Will not experience complications related to urinary retention Outcome: Progressing   Problem: Pain Managment: Goal: General experience of comfort will improve Outcome: Progressing Note: No complaints of pain this shift   Problem: Safety: Goal: Ability to remain free from injury will improve Outcome: Progressing   Problem: Education: Goal: Knowledge of disease or condition will improve Outcome: Progressing Goal: Knowledge of secondary prevention will improve Outcome: Progressing Goal: Knowledge of patient specific risk factors addressed and post discharge goals established will improve Outcome: Progressing   Problem: Coping: Goal: Will verbalize positive feelings about self Outcome: Progressing Goal: Will identify appropriate support needs Outcome: Progressing   Problem: Self-Care: Goal: Ability to participate in self-care as condition permits will improve Outcome: Progressing Goal: Verbalization of feelings and concerns over difficulty with self-care will improve Outcome: Progressing Goal: Ability to communicate needs accurately will improve Outcome: Progressing   Problem: Nutrition: Goal: Risk of aspiration will decrease Outcome: Progressing Note: No trouble swallowing Goal: Dietary intake will improve Outcome:  Progressing Note: Good appetite

## 2020-02-05 NOTE — H&P (View-Only) (Signed)
Hospital Consult    Reason for Consult:  R eye blindness, carotid stenosis Requesting Physician:  Dr. Pietro Cassis MRN #:  [id number]  History of Present Illness: This is a 86 y.o. male with past medical history significant for hypertension, hyperlipidemia, and chronic back pain with history of 4 back surgeries who presented to ED yesterday with central right eye vision loss.  Ophthamalogist diagnosed central retinal artery occlusion.  Workup included MRI brain negative for CVA.  CTA neck demonstrated irregular R ICA plaque estimated to be 58% stenosis.  Carotid duplex also shows upper end 40-59% stenosis R ICA.  He says his vision has improved and has vision in lateral visual field of right eye.  He denies any further stroke like symptoms including slurring speech or one sided weakness.  He was started on full aspirin and statin by Neurology.  He is a former smoker.  Past Medical History:  Diagnosis Date  . Arthritis   . BPH (benign prostatic hypertrophy)   . 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 GERD (gastroesophageal reflux disease)   . Hearing aid worn    Hx: of right ear  . Hyperlipemia   . Hypertension   . Lumbar herniated disc   . Lumbar stenosis   . PONV (postoperative nausea and vomiting)   . Seasonal allergies    Hx: of    Past Surgical History:  Procedure Laterality Date  . COLONOSCOPY     Hx: of  . EYE SURGERY     bil catarcts 02/2016  . HERNIA REPAIR    . LUMBAR LAMINECTOMY  10/26/2012   Procedure: MICRODISCECTOMY LUMBAR LAMINECTOMY;  Surgeon: Marybelle Killings, MD;  Location: [facility];  Service: Orthopedics;  Laterality: Right;  Right L4-5 Microdiscectomy  . MAXIMUM ACCESS (MAS)POSTERIOR LUMBAR INTERBODY FUSION (PLIF) 1 LEVEL N/A 04/15/2015   Procedure: Maximum Access Surgery Posterior Lumbar Interbody Fusion Lumbar two-Lumbar three extension of hardware;  Surgeon: Eustace Moore, MD;  Location: [facility];  Service: Neurosurgery;  Laterality: N/A;  .  ROTATOR CUFF REPAIR     right shoulder - 3 times  . TONSILLECTOMY    . TRANSURETHRAL RESECTION OF BLADDER TUMOR N/A 02/24/2017   Procedure: TRANSURETHRAL RESECTION OF BLADDER TUMOR (TURBT);  Surgeon: Alexis Frock, MD;  Location: [facility];  Service: Urology;  Laterality: N/A;    No Known Allergies  Prior to Admission medications   Medication Sig Start Date End Date Taking? Authorizing Provider  Chlorphen-Phenyleph-ASA (ALKA-SELTZER PLUS COLD PO) Take 1 capsule by mouth at bedtime.   Yes [provider]  cromolyn (OPTICROM) 4 % ophthalmic solution Place 1 drop into both eyes in the morning, at noon, and at bedtime. 12/29/19  Yes [provider]  finasteride (PROSCAR) 5 MG tablet Take 5 mg by mouth at bedtime.    Yes [provider]  hydrochlorothiazide (HYDRODIURIL) 50 MG tablet Take 50 mg by mouth at bedtime.    Yes [provider]  Liniments (SALONPAS PAIN RELIEF PATCH EX) Place 1 patch onto the skin daily as needed (to affected area for pain).    Yes [provider]  lisinopril (PRINIVIL,ZESTRIL) 20 MG tablet Take 20 mg by mouth at bedtime.    Yes [provider]  Multiple Vitamins-Minerals (PRESERVISION AREDS 2 PO) Take 1 capsule by mouth 2 (two) times daily.    Yes [provider]  naproxen sodium (ALEVE) 220 MG tablet Take 440 mg by mouth daily with breakfast.  Yes [provider]  Omega-3 Fatty Acids (FISH OIL) 1200 MG CAPS Take 1,200 mg by mouth daily with breakfast.    Yes [provider]  omeprazole (PRILOSEC) 40 MG capsule Take 1 capsule (40 mg total) by mouth daily. 11/26/19  Yes Jay Nunnery, MD  SYSTANE 0.4-0.3 % SOLN Place 1 drop into both eyes in the morning, at noon, and at bedtime.    Yes [provider]  Tamsulosin HCl (FLOMAX) 0.4 MG CAPS Take 0.4 mg by mouth at bedtime.    Yes [provider]  tiZANidine (ZANAFLEX) 4 MG tablet Take 1 tablet (4 mg total) by mouth every 8  (eight) hours as needed for muscle spasms. Patient taking differently: Take 4 mg by mouth at bedtime as needed for muscle spasms.  08/11/18  Yes Eustace Moore, MD  trimethoprim-polymyxin b Mayra Neer) ophthalmic solution Place 1 drop into the right eye See admin instructions. INSTILL 1 DROP INTO THE RIGHT EYE 4 TIMES DAILY FOR 2 DAYS AFTER EACH MONTHLY EYE INJECTION 10/14/19  Yes [provider]  Turmeric (QC TUMERIC COMPLEX PO) Take 1 capsule by mouth daily with breakfast.    Yes [provider]  azelastine (ASTELIN) 0.1 % nasal spray Place 2 sprays into both nostrils 2 (two) times daily. Use in each nostril as directed Patient not taking: Reported on 02/04/2020 07/04/19   Kennith Gain, MD  mometasone (NASONEX) 50 MCG/ACT nasal spray Place 2 sprays into the nose daily. Patient not taking: Reported on 02/04/2020 11/26/19   Jay Nunnery, MD  traMADol (ULTRAM) 50 MG tablet Take 50 mg by mouth every 6 (six) hours as needed (for pain).     [provider]    Social History   Socioeconomic History  . Marital status: Married    Spouse name: Not on file  . Number of children: Not on file  . Years of education: Not on file  . Highest education level: Not on file  Occupational History  . Not on file  Tobacco Use  . Smoking status: Former Smoker    Quit date: 11/12/1963    Years since quitting: 56.2  . Smokeless tobacco: Never Used  Substance and Sexual Activity  . Alcohol use: No  . Drug use: No  . Sexual activity: Yes  Other Topics Concern  . Not on file  Social History Narrative  . Not on file   Social Determinants of Health   Financial Resource Strain:   . Difficulty of Paying Living Expenses: Not on file  Food Insecurity:   . Worried About Charity fundraiser in the Last Year: Not on file  . Ran Out of Food in the Last Year: Not on file  Transportation Needs:   . Lack of Transportation (Medical): Not on file  . Lack of Transportation  (Non-Medical): Not on file  Physical Activity:   . Days of Exercise per Week: Not on file  . Minutes of Exercise per Session: Not on file  Stress:   . Feeling of Stress : Not on file  Social Connections:   . Frequency of Communication with Friends and Family: Not on file  . Frequency of Social Gatherings with Friends and Family: Not on file  . Attends Religious Services: Not on file  . Active Member of Clubs or Organizations: Not on file  . Attends Archivist Meetings: Not on file  . Marital Status: Not on file  Intimate Partner Violence:   . Fear  of Current or Ex-Partner: Not on file  . Emotionally Abused: Not on file  . Physically Abused: Not on file  . Sexually Abused: Not on file     Family History  Problem Relation Age of Onset  . Stroke Mother   . Cancer - Prostate Father     ROS: Otherwise negative unless mentioned in HPI  Physical Examination  Vitals:   02/05/20 1012 02/05/20 1258  BP: (!) 160/74 (!) 165/71  Pulse: 67 75  Resp: 18 18  Temp: 98.3 F (36.8 C) 98.5 F (36.9 C)  SpO2: 93% 95%   Body mass index is 27.46 kg/m.  General:  WDWN in NAD Gait: Not observed HENT: WNL, normocephalic Pulmonary: normal non-labored breathing, without Rales, rhonchi,  wheezing Cardiac: regular Abdomen:  soft, NT/ND, no masses Skin: without rashes Vascular Exam/Pulses: symmetrical radial and DP pulses Extremities: without ischemic changes, without Gangrene , without cellulitis; without open wounds;  Musculoskeletal: no muscle wasting or atrophy  Neurologic: A&O X 3;  R eye with lateral visual field only Psychiatric:  The pt has Normal affect. Lymph:  Unremarkable  CBC    Component Value Date/Time   WBC 7.2 02/04/2020 1227   RBC 4.58 02/04/2020 1227   HGB 14.6 02/04/2020 1326   HCT 43.0 02/04/2020 1326   PLT 320 02/04/2020 1227   MCV 94.5 02/04/2020 1227   MCH 32.8 02/04/2020 1227   MCHC 34.6 02/04/2020 1227   RDW 12.3 02/04/2020 1227   LYMPHSABS  1.0 02/04/2020 1227   MONOABS 0.9 02/04/2020 1227   EOSABS 0.2 02/04/2020 1227   BASOSABS 0.1 02/04/2020 1227    BMET    Component Value Date/Time   NA 132 (L) 02/04/2020 1326   K 3.4 (L) 02/04/2020 1326   CL 95 (L) 02/04/2020 1326   CO2 24 02/04/2020 1227   GLUCOSE 99 02/04/2020 1326   BUN 16 02/04/2020 1326   CREATININE 0.90 02/04/2020 1326   CALCIUM 9.0 02/04/2020 1227   GFRNONAA >60 02/04/2020 1227   GFRAA >60 02/04/2020 1227    COAGS: Lab Results  Component Value Date   INR 1.0 02/04/2020   INR 1.02 08/03/2018   INR 1.01 04/08/2015     Non-Invasive Vascular Imaging:   CTA 58% stenosis R ICA, irregular plaque Carotid duplex upper end 40-59% stenosis R ICA   ASSESSMENT/PLAN: This is a 86 y.o. male with R CRAO, R ICA stenosis  R eye vision improving; has lateral visual field vision now R ICA 58% stenosis by CTA; upper end 40-59% stenosis by duplex With symptomatic lesion >50% stenosis, patient may benefit from revascularization of R ICA Agree with aspirin and statin On call surgeon Dr. Trula Slade will evaluate the patient later today and provide further treatment plans   Dagoberto Ligas PA-C [facility] [phone]  I agree with the above. I have seen and evaluated the patient. Briefly this is an 86 year old male with hypertension and hypercholesterolemia who presented with right eye vision loss which has somewhat improved. CT angiogram shows approximately 60% right carotid stenosis which was confirmed with duplex ultrasound. I discussed that he would need carotid revascularization to prevent future embolic events. I think he is best suited for right carotid endarterectomy. I discussed the risks and benefits of the surgery with the patient and his wife. We discussed the risk of stroke, nerve injury, cardiopulmonary complications and bleeding. All of their questions were answered. I have scheduled him for surgery on Friday. If weather becomes an issue,  we  will proceed next week. The patient would very much like to go home tonight and return for his surgery. We are working on making sure he has appropriate medications to go home with.  Annamarie Major

## 2020-02-05 NOTE — TOC Initial Note (Addendum)
Transition of Care [facility]) - Initial/Assessment Note    Patient Details  Name: Jay Tran MRN: [id number] Date of Birth: Jan 24, 1934  Transition of Care [facility]) CM/SW Contact:    Marilu Favre, RN Phone Number: 02/05/2020, 11:37 AM  Clinical Narrative:                 Spoke to patient and wife at bedside. PT recommending HHPT. Confirmed with patient PCP is Dr Arelia Sneddon, in past Dr Arelia Sneddon will not sign home health orders until he sees patient in office for follow up appointment.   Will call Dr Arelia Sneddon office to discuss. Patient and wife aware.  Called Dr Arelia Sneddon office, Dr Arelia Sneddon will see patient at follow up appointment and then determine if home health is needed .  Patient and wife Arville Go aware. Arville Go will call to schedule appointment.  Expected Discharge Plan: [place]     Patient Goals and CMS Choice Patient states their goals for this hospitalization and ongoing recovery are:: to go home CMS Medicare.gov Compare Post Acute Care list provided to:: Patient Choice offered to / list presented to : Patient, Spouse  Expected Discharge Plan and Services Expected Discharge Plan: [place]       Living arrangements for the past 2 months: Single Family Home                 DME Arranged: N/A         HH Arranged: PT          Prior Living Arrangements/Services Living arrangements for the past 2 months: Single Family Home Lives with:: Spouse Patient language and need for interpreter reviewed:: Yes        Need for Family Participation in Patient Care: Yes (Comment) Care giver support system in place?: Yes (comment)   Criminal Activity/Legal Involvement Pertinent to Current Situation/Hospitalization: No - Comment as needed  Activities of Daily Living Home Assistive Devices/Equipment: None ADL Screening (condition at time of admission) Patient's cognitive ability adequate to safely complete daily activities?: Yes Is the patient deaf or  have difficulty hearing?: No Does the patient have difficulty seeing, even when wearing glasses/contacts?: Yes Does the patient have difficulty concentrating, remembering, or making decisions?: No Patient able to express need for assistance with ADLs?: Yes Does the patient have difficulty dressing or bathing?: No Independently performs ADLs?: Yes (appropriate for developmental age) Does the patient have difficulty walking or climbing stairs?: No Weakness of Legs: None Weakness of Arms/Hands: None  Permission Sought/Granted   Permission granted to share information with : Yes, Verbal Permission Granted  Share Information with NAME: Arville Go     Permission granted to share info w Relationship: spouse     Emotional Assessment Appearance:: Appears stated age Attitude/Demeanor/Rapport: Engaged Affect (typically observed): Accepting Orientation: : Oriented to Self, Oriented to Place, Oriented to  Time, Oriented to Situation Alcohol / Substance Use: Not Applicable Psych Involvement: No (comment)  Admission diagnosis:  Vision loss of right eye [H54.61] Central retinal artery occlusion of right eye [H34.11] Patient Active Problem List   Diagnosis Date Noted  . Internal carotid artery stenosis 02/05/2020  . Essential hypertension 02/05/2020  . BPH (benign prostatic hyperplasia) 02/05/2020  . GERD (gastroesophageal reflux disease) 02/05/2020  . Chronic back pain 02/05/2020  . Vision loss of right eye 02/04/2020  . Wound drainage 08/20/2018  . Chronic pain of both shoulders 02/22/2017  . S/P lumbar spinal fusion 04/15/2015  . HNP (herniated nucleus pulposus),  lumbar 10/26/2012    Class: Diagnosis of   PCP:  Leonard Downing, MD Pharmacy:   [facility] [place] - [id number] [place] ([place]) [place] [address] Phone: [phone] Fax: [phone]     Social Determinants of Health (SDOH) Interventions    Readmission Risk  Interventions No flowsheet data found.

## 2020-02-05 NOTE — Progress Notes (Signed)
  Echocardiogram 2D Echocardiogram has been performed.  Jay Tran 02/05/2020, 2:17 PM

## 2020-02-05 NOTE — H&P (Signed)
History and Physical    Jay Tran [id number] DOB: Nov 12, 1934 DOA: 02/04/2020  PCP: Leonard Downing, MD  Patient coming from: home, lives with wife  I have personally briefly reviewed patient's old medical records in [place]  Chief Complaint: chest pain  HPI: Jay Tran is a 86 y.o. male with medical history significant for hypertension, hyperlipidemia, macular degeneration and chronic back pain who presents with concerns of right eye vision loss.  He was last normal last night at 9:30 PM and then awoke this morning at 7:30 AM and could not see out of his right eye.  Denies any extremity weakness.  No dizziness or headache.  He then was seen at his ophthalmologist and was sent over to the ER for concerns of acute central retinal artery occlusion.  He had an ocular paracentesis performed prior to arrival.  He was afebrile, intermittently hypertensive up to systolic of 123456 on room air. CMP and BMP otherwise unremarkable.  CT head negative.  MRI brain negative. CTA head and neck showed 58% stenosis of the proximal right ICA.  ED physician is consulted neurology who will evaluate bedside.  Review of Systems: Constitutional: No Weight Change, No Fever ENT/Mouth: No sore throat, No Rhinorrhea Eyes: No Eye Pain, + Vision Changes Cardiovascular: No Chest Pain, no SOB Respiratory: No Cough, No Sputum, No Wheezing, no Dyspnea  Gastrointestinal: No Nausea, No Vomiting, No Diarrhea, No Constipation, No Pain Genitourinary: no Urinary Incontinence Musculoskeletal: No Arthralgias, No Myalgias Skin: No Skin Lesions, No Pruritus, Neuro: no Weakness, No Numbness,  No Loss of Consciousness, No Syncope Psych: No Anxiety/Panic, No Depression, no decrease appetite Heme/Lymph: No Bruising, No Bleeding  Past Medical History:  Diagnosis Date  . Arthritis   . BPH (benign prostatic hypertrophy)   . 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 GERD  (gastroesophageal reflux disease)   . Hearing aid worn    Hx: of right ear  . Hyperlipemia   . Hypertension   . Lumbar herniated disc   . Lumbar stenosis   . PONV (postoperative nausea and vomiting)   . Seasonal allergies    Hx: of    Past Surgical History:  Procedure Laterality Date  . COLONOSCOPY     Hx: of  . EYE SURGERY     bil catarcts 02/2016  . HERNIA REPAIR    . LUMBAR LAMINECTOMY  10/26/2012   Procedure: MICRODISCECTOMY LUMBAR LAMINECTOMY;  Surgeon: Marybelle Killings, MD;  Location: [facility];  Service: Orthopedics;  Laterality: Right;  Right L4-5 Microdiscectomy  . MAXIMUM ACCESS (MAS)POSTERIOR LUMBAR INTERBODY FUSION (PLIF) 1 LEVEL N/A 04/15/2015   Procedure: Maximum Access Surgery Posterior Lumbar Interbody Fusion Lumbar two-Lumbar three extension of hardware;  Surgeon: Eustace Moore, MD;  Location: [facility];  Service: Neurosurgery;  Laterality: N/A;  . ROTATOR CUFF REPAIR     right shoulder - 3 times  . TONSILLECTOMY    . TRANSURETHRAL RESECTION OF BLADDER TUMOR N/A 02/24/2017   Procedure: TRANSURETHRAL RESECTION OF BLADDER TUMOR (TURBT);  Surgeon: Alexis Frock, MD;  Location: [facility];  Service: Urology;  Laterality: N/A;     reports that he quit smoking about 56 years ago. He has never used smokeless tobacco. He reports that he does not drink alcohol or use drugs.  No Known Allergies  Family History  Problem Relation Age of Onset  . Stroke Mother   . Cancer - Prostate Father  Prior to Admission medications   Medication Sig Start Date End Date Taking? Authorizing Provider  Chlorphen-Phenyleph-ASA (ALKA-SELTZER PLUS COLD PO) Take 1 capsule by mouth at bedtime.   Yes [provider]  cromolyn (OPTICROM) 4 % ophthalmic solution Place 1 drop into both eyes in the morning, at noon, and at bedtime. 12/29/19  Yes [provider]  finasteride (PROSCAR) 5 MG tablet Take 5 mg by mouth at bedtime.    Yes [provider]  hydrochlorothiazide  (HYDRODIURIL) 50 MG tablet Take 50 mg by mouth at bedtime.    Yes [provider]  Liniments (SALONPAS PAIN RELIEF PATCH EX) Place 1 patch onto the skin daily as needed (to affected area for pain).    Yes [provider]  lisinopril (PRINIVIL,ZESTRIL) 20 MG tablet Take 20 mg by mouth at bedtime.    Yes [provider]  Multiple Vitamins-Minerals (PRESERVISION AREDS 2 PO) Take 1 capsule by mouth 2 (two) times daily.    Yes [provider]  naproxen sodium (ALEVE) 220 MG tablet Take 440 mg by mouth daily with breakfast.   Yes [provider]  Omega-3 Fatty Acids (FISH OIL) 1200 MG CAPS Take 1,200 mg by mouth daily with breakfast.    Yes [provider]  omeprazole (PRILOSEC) 40 MG capsule Take 1 capsule (40 mg total) by mouth daily. 11/26/19  Yes Rozetta Nunnery, MD  SYSTANE 0.4-0.3 % SOLN Place 1 drop into both eyes in the morning, at noon, and at bedtime.    Yes [provider]  Tamsulosin HCl (FLOMAX) 0.4 MG CAPS Take 0.4 mg by mouth at bedtime.    Yes [provider]  tiZANidine (ZANAFLEX) 4 MG tablet Take 1 tablet (4 mg total) by mouth every 8 (eight) hours as needed for muscle spasms. Patient taking differently: Take 4 mg by mouth at bedtime as needed for muscle spasms.  08/11/18  Yes Eustace Moore, MD  trimethoprim-polymyxin b Mayra Neer) ophthalmic solution Place 1 drop into the right eye See admin instructions. INSTILL 1 DROP INTO THE RIGHT EYE 4 TIMES DAILY FOR 2 DAYS AFTER EACH MONTHLY EYE INJECTION 10/14/19  Yes [provider]  Turmeric (QC TUMERIC COMPLEX PO) Take 1 capsule by mouth daily with breakfast.    Yes [provider]  azelastine (ASTELIN) 0.1 % nasal spray Place 2 sprays into both nostrils 2 (two) times daily. Use in each nostril as directed Patient not taking: Reported on 02/04/2020 07/04/19   Jay Gain, MD  mometasone (NASONEX) 50 MCG/ACT nasal spray Place 2 sprays into  the nose daily. Patient not taking: Reported on 02/04/2020 11/26/19   Rozetta Nunnery, MD  traMADol (ULTRAM) 50 MG tablet Take 50 mg by mouth every 6 (six) hours as needed (for pain).     [provider]    Physical Exam: Vitals:   02/04/20 2105 02/04/20 2126 02/04/20 2320 02/04/20 2350  BP:  (!) 173/90 (!) 158/77 (!) 157/82  Pulse: 73 70 68 69  Resp:   14 16  Temp:    98.2 F (36.8 C)  TempSrc:    Oral  SpO2: 97% 96% 94% 97%    Constitutional: NAD, calm, comfortable Vitals:   02/04/20 2105 02/04/20 2126 02/04/20 2320 02/04/20 2350  BP:  (!) 173/90 (!) 158/77 (!) 157/82  Pulse: 73 70 68 69  Resp:   14 16  Temp:    98.2 F (36.8 C)  TempSrc:    Oral  SpO2: 97% 96%  94% 97%   Eyes: PERRL, lids and conjunctivae normal ENMT: Mucous membranes are moist. Posterior pharynx clear of any exudate or lesions.Normal dentition.  Neck: normal, supple, no masses, no thyromegaly Respiratory: clear to auscultation bilaterally, no wheezing, no crackles. Normal respiratory effort. No accessory muscle use.  Cardiovascular: Regular rate and rhythm, no murmurs / rubs / gallops. No extremity edema. 2+ pedal pulses. No carotid bruits.  Abdomen: no tenderness, no masses palpated. No hepatosplenomegaly. Bowel sounds positive.  Musculoskeletal: no clubbing / cyanosis. No joint deformity upper and lower extremities. Good ROM, no contractures. Normal muscle tone.  Skin: no rashes, lesions, ulcers. No induration Neurologic: CN 2-12 grossly intact. Sensation intact, DTR normal. Strength 5/5 in all 4.  Psychiatric: Normal judgment and insight. Alert and oriented x 3. Normal mood.   (Anything < 9 systems with 2 bullets each down codes to level 1) (If patient refuses exam can't bill higher level) (Make sure to document decubitus ulcers present on admission -- if possible -- and whether patient has chronic indwelling catheter at time of admission)  Labs on Admission: I have personally reviewed  following labs and imaging studies  CBC: Recent Labs  Lab 02/04/20 1227 02/04/20 1326  WBC 7.2  --   NEUTROABS 5.1  --   HGB 15.0 14.6  HCT 43.3 43.0  MCV 94.5  --   PLT 320  --    Basic Metabolic Panel: Recent Labs  Lab 02/04/20 1227 02/04/20 1326  NA 134* 132*  K 3.6 3.4*  CL 98 95*  CO2 24  --   GLUCOSE 105* 99  BUN 12 16  CREATININE 0.95 0.90  CALCIUM 9.0  --    GFR: CrCl cannot be calculated (Unknown ideal weight.). Liver Function Tests: Recent Labs  Lab 02/04/20 1227  AST 22  ALT 5  ALKPHOS 53  BILITOT 1.3*  PROT 7.1  ALBUMIN 4.2   No results for input(s): LIPASE, AMYLASE in the last 168 hours. No results for input(s): AMMONIA in the last 168 hours. Coagulation Profile: Recent Labs  Lab 02/04/20 1227  INR 1.0   Cardiac Enzymes: Recent Labs  Lab 02/04/20 2215  CKTOTAL 225   BNP (last 3 results) No results for input(s): PROBNP in the last 8760 hours. HbA1C: Recent Labs    02/04/20 2305  HGBA1C 5.4   CBG: No results for input(s): GLUCAP in the last 168 hours. Lipid Profile: No results for input(s): CHOL, HDL, LDLCALC, TRIG, CHOLHDL, LDLDIRECT in the last 72 hours. Thyroid Function Tests: No results for input(s): TSH, T4TOTAL, FREET4, T3FREE, THYROIDAB in the last 72 hours. Anemia Panel: No results for input(s): VITAMINB12, FOLATE, FERRITIN, TIBC, IRON, RETICCTPCT in the last 72 hours. Urine analysis:    Component Value Date/Time   COLORURINE YELLOW 05/27/2010 1530   APPEARANCEUR CLOUDY (A) 05/27/2010 1530   LABSPEC 1.010 05/27/2010 1530   PHURINE 6.5 05/27/2010 1530   GLUCOSEU NEGATIVE 05/27/2010 1530   HGBUR NEGATIVE 05/27/2010 1530   BILIRUBINUR NEGATIVE 05/27/2010 1530   KETONESUR NEGATIVE 05/27/2010 1530   PROTEINUR NEGATIVE 05/27/2010 1530   UROBILINOGEN 0.2 05/27/2010 1530   NITRITE NEGATIVE 05/27/2010 1530   LEUKOCYTESUR TRACE (A) 05/27/2010 1530    Radiological Exams on Admission: CT ANGIO HEAD W OR WO  CONTRAST  Result Date: 02/04/2020 CLINICAL DATA:  Stroke follow-up EXAM: CT ANGIOGRAPHY HEAD AND NECK TECHNIQUE: Multidetector CT imaging of the head and neck was performed using the standard protocol during bolus administration of intravenous contrast. Multiplanar CT image reconstructions  and MIPs were obtained to evaluate the vascular anatomy. Carotid stenosis measurements (when applicable) are obtained utilizing NASCET criteria, using the distal internal carotid diameter as the denominator. CONTRAST:  125mL OMNIPAQUE IOHEXOL 350 MG/ML SOLN COMPARISON:  None. FINDINGS: CTA NECK FINDINGS SKELETON: 1 OTHER NECK: Normal pharynx, larynx and major salivary glands. No cervical lymphadenopathy. Enlarged left lobe of the thyroid gland. UPPER CHEST: No pneumothorax or pleural effusion. No nodules or masses. AORTIC ARCH: There is no calcific atherosclerosis of the aortic arch. There is no aneurysm, dissection or hemodynamically significant stenosis of the visualized portion of the aorta. Conventional 3 vessel aortic branching pattern. The visualized proximal subclavian arteries are widely patent. RIGHT CAROTID SYSTEM: No dissection, occlusion or aneurysm. There is mixed density atherosclerosis extending into the proximal ICA, resulting in 58% stenosis. LEFT CAROTID SYSTEM: No dissection, occlusion or aneurysm. Mild atherosclerotic calcification at the carotid bifurcation without hemodynamically significant stenosis. VERTEBRAL ARTERIES: Left dominant configuration. Both origins are clearly patent. There is no dissection, occlusion or flow-limiting stenosis to the skull base (V1-V3 segments). CTA HEAD FINDINGS POSTERIOR CIRCULATION: --Vertebral arteries: Normal V4 segments. --Posterior inferior cerebellar arteries (PICA): Patent origins from the vertebral arteries. --Anterior inferior cerebellar arteries (AICA): Patent origins from the basilar artery. --Basilar artery: Normal. --Superior cerebellar arteries: Normal.  --Posterior cerebral arteries: Normal. Both originate from the basilar artery. Posterior communicating arteries (p-comm) are diminutive or absent. ANTERIOR CIRCULATION: --Intracranial internal carotid arteries: Normal. --Anterior cerebral arteries (ACA): Normal. Both A1 segments are present. Patent anterior communicating artery (a-comm). --Middle cerebral arteries (MCA): Normal. VENOUS SINUSES: As permitted by contrast timing, patent. ANATOMIC VARIANTS: None Review of the MIP images confirms the above findings. IMPRESSION: 1. No intracranial arterial occlusion or hemodynamically significant stenosis. 2. There is 58% stenosis of the proximal right ICA secondary to mixed density atherosclerosis. 3. Enlarged left thyroid lobe. Electronically Signed   By: Ulyses Jarred M.D.   On: 02/04/2020 19:52   CT HEAD WO CONTRAST  Result Date: 02/04/2020 CLINICAL DATA:  Acute neurological deficit. EXAM: CT HEAD WITHOUT CONTRAST TECHNIQUE: Contiguous axial images were obtained from the base of the skull through the vertex without intravenous contrast. COMPARISON:  None. FINDINGS: Brain: No evidence of acute infarction, hemorrhage, extra-axial collection, ventriculomegaly, or mass effect. Generalized cerebral atrophy. Periventricular white matter low attenuation likely secondary to microangiopathy. Vascular: Cerebrovascular atherosclerotic calcifications are noted. Skull: Negative for fracture or focal lesion. Sinuses/Orbits: Visualized portions of the orbits are unremarkable. Visualized portions of the paranasal sinuses and mastoid air cells are unremarkable. Other: None. IMPRESSION: 1. No acute intracranial pathology. 2. Chronic microvascular disease and cerebral atrophy. Electronically Signed   By: Kathreen Devoid   On: 02/04/2020 15:27   CT ANGIO NECK W OR WO CONTRAST  Result Date: 02/04/2020 CLINICAL DATA:  Stroke follow-up EXAM: CT ANGIOGRAPHY HEAD AND NECK TECHNIQUE: Multidetector CT imaging of the head and neck was  performed using the standard protocol during bolus administration of intravenous contrast. Multiplanar CT image reconstructions and MIPs were obtained to evaluate the vascular anatomy. Carotid stenosis measurements (when applicable) are obtained utilizing NASCET criteria, using the distal internal carotid diameter as the denominator. CONTRAST:  150mL OMNIPAQUE IOHEXOL 350 MG/ML SOLN COMPARISON:  None. FINDINGS: CTA NECK FINDINGS SKELETON: 1 OTHER NECK: Normal pharynx, larynx and major salivary glands. No cervical lymphadenopathy. Enlarged left lobe of the thyroid gland. UPPER CHEST: No pneumothorax or pleural effusion. No nodules or masses. AORTIC ARCH: There is no calcific atherosclerosis of the aortic arch. There is no aneurysm, dissection  or hemodynamically significant stenosis of the visualized portion of the aorta. Conventional 3 vessel aortic branching pattern. The visualized proximal subclavian arteries are widely patent. RIGHT CAROTID SYSTEM: No dissection, occlusion or aneurysm. There is mixed density atherosclerosis extending into the proximal ICA, resulting in 58% stenosis. LEFT CAROTID SYSTEM: No dissection, occlusion or aneurysm. Mild atherosclerotic calcification at the carotid bifurcation without hemodynamically significant stenosis. VERTEBRAL ARTERIES: Left dominant configuration. Both origins are clearly patent. There is no dissection, occlusion or flow-limiting stenosis to the skull base (V1-V3 segments). CTA HEAD FINDINGS POSTERIOR CIRCULATION: --Vertebral arteries: Normal V4 segments. --Posterior inferior cerebellar arteries (PICA): Patent origins from the vertebral arteries. --Anterior inferior cerebellar arteries (AICA): Patent origins from the basilar artery. --Basilar artery: Normal. --Superior cerebellar arteries: Normal. --Posterior cerebral arteries: Normal. Both originate from the basilar artery. Posterior communicating arteries (p-comm) are diminutive or absent. ANTERIOR CIRCULATION:  --Intracranial internal carotid arteries: Normal. --Anterior cerebral arteries (ACA): Normal. Both A1 segments are present. Patent anterior communicating artery (a-comm). --Middle cerebral arteries (MCA): Normal. VENOUS SINUSES: As permitted by contrast timing, patent. ANATOMIC VARIANTS: None Review of the MIP images confirms the above findings. IMPRESSION: 1. No intracranial arterial occlusion or hemodynamically significant stenosis. 2. There is 58% stenosis of the proximal right ICA secondary to mixed density atherosclerosis. 3. Enlarged left thyroid lobe. Electronically Signed   By: Ulyses Jarred M.D.   On: 02/04/2020 19:52   MR BRAIN WO CONTRAST  Result Date: 02/04/2020 CLINICAL DATA:  Stroke follow-up. Central retinal artery occlusion. EXAM: MRI HEAD WITHOUT CONTRAST TECHNIQUE: Multiplanar, multiecho pulse sequences of the brain and surrounding structures were obtained without intravenous contrast. COMPARISON:  None. FINDINGS: Brain: No acute infarct, acute hemorrhage or extra-axial collection. Multifocal white matter hyperintensity, most commonly due to chronic ischemic microangiopathy. Normal volume of CSF spaces. No chronic microhemorrhage. Normal midline structures. Vascular: Normal flow voids. Skull and upper cervical spine: Normal marrow signal. Sinuses/Orbits: Negative. Other: None. IMPRESSION: 1. No acute intracranial abnormality. 2. Multifocal white matter hyperintensity, most commonly due to chronic ischemic microangiopathy. Electronically Signed   By: Ulyses Jarred M.D.   On: 02/04/2020 19:20     Assessment/Plan Acute Right eye vision loss - MRI brain negative - CTA head and neck-shows 58% stenosis of the proximal right ICA - echocardiogram  - start daily aspirin and atorvastatin -Obtain A1c and lipids -PT/OT/SLT -Frequent neuro checks and keep on telemetry -Allow for modified permissive hypertension with blood pressure treatment as needed only if systolic goes above 99991111  Proximal  right ICA stenosis 58% stenosis Consider vascular consult  HTN Hold HCTZ and Lisinopril for permissive HTN   BPH  Continue Proscar and Flomax   GERD Continue PPI  Chronic back pain Continue PRN Tramadol and Tizanidine  DVT prophylaxis:.Lovenox Code Status: Full Family Communication: Plan discussed with patient at bedside  disposition Plan: Home with at least 2 midnight stays  Consults called: Neurology Admission status: inpatient with at least 2 midnight stay given acute right vision loss and stroke work-up   Hershall Benkert T Sequoyah Ramone DO [facility]   If 7PM-7AM, please contact night-coverage [email or web address]   02/05/2020, 12:33 AM

## 2020-02-05 NOTE — Progress Notes (Signed)
PROGRESS NOTE  Jay Tran  DOB: 20-Jan-1934  PCP: Leonard Downing, MD YR:[id number]  DOA: 02/04/2020 Admitted From: Home  LOS: 1 day   No chief complaint on file.  Brief narrative: Patient is an 86 y.o. male with a history of lumbar stenosis, hypertension, hyperlipidemia, DVT of lower extremity. Patient was sent to the ED by his Ophthalmologist for stroke work-up after found to have a right sided CRAO (central retinal artery occlusion).   2/15, patient went to bed in his usual state of health.  Next morning he woke up completely blind in his right eye. He immediately called his Ophthalmologist, got evaluated in the office and was found to have central retinal artery occlusion.  Patient was immediately sent to the hospital to be evaluated for stroke.   In the ED, patient was afebrile, intermittently hypertensive up to systolic of 123456 on room air. CBC and BMP otherwise unremarkable.   CT head negative.   MRI brain negative. CTA head and neck showed 58% stenosis of the proximal right ICA.  Patient was admitted under hospitalist service for further stroke work-up. Neurology consult appreciated  Subjective: Patient was seen and examined in the morning.  Not in distress.  Vision gradually improving.  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 -Found to have CRAO ophthalmologist. -Admitted for stroke work-up.   -CT head negative.   -MRI brain negative. -CTA head and neck showed 58% stenosis of the proximal right ICA. -echocardiogram pending. -Started on daily aspirin and atorvastatin -Hemoglobin A1c 5.4, LDL low at 29, LDL elevated to 128. -PT/OT/ST eval obtained.  Home with home implant. -Continue frequent neuro checks and keep on telemetry -Allow for modified permissive hypertension with blood pressure treatment as needed only if systolic goes above 99991111  Proximal right ICA stenosis -58% stenosis -Continue medical  management.  -Vascular surgery consult pending.  HTN Hold HCTZ and Lisinopril for permissive HTN   BPH  Continue Proscar and Flomax   GERD Continue PPI  Chronic back pain Continue PRN Tramadol and Tizanidine  Body mass index is 27.46 kg/m. Mobility: PT/OT eval obtained. Diet: Cardiac diet Fluid: None DVT prophylaxis:  Lovenox subcu Code Status:  Full code Family Communication:  Wife at bedside Expected Discharge:  Pending completion of stroke work-up  Consultants:  Neurology  Procedures:  None  Antimicrobials: Anti-infectives (From admission, onward)   None        Code Status: Full Code   Diet Order            Diet Heart Room service appropriate? Yes; Fluid consistency: Thin  Diet effective now              Infusions:    Scheduled Meds: .  stroke: mapping our early stages of recovery book   Does not apply Once  . [START ON 02/06/2020] aspirin  81 mg Oral Daily  . atorvastatin  40 mg Oral q1800  . clopidogrel  75 mg Oral Daily  . enoxaparin (LOVENOX) injection  40 mg Subcutaneous Q24H  . finasteride  5 mg Oral QHS  . pantoprazole  40 mg Oral Daily  . tamsulosin  0.4 mg Oral QHS    PRN meds: acetaminophen **OR** acetaminophen (TYLENOL) oral liquid 160 mg/5 mL **OR** acetaminophen, labetalol, tiZANidine, traMADol   Objective: Vitals:   02/05/20 0500 02/05/20 1012  BP: 117/83 (!) 160/74  Pulse: 65 67  Resp: 17 18  Temp: 98.4 F (36.9 C) 98.3 F (36.8 C)  SpO2: 97% 93%   No intake or output data in the 24 hours ending 02/05/20 1251 Filed Weights   02/05/20 1030  Weight: 86.8 kg   Weight change:  Body mass index is 27.46 kg/m.   Physical Exam: General exam: Appears calm and comfortable.  Skin: No rashes, lesions or ulcers. HEENT: Atraumatic, normocephalic, supple neck, no obvious bleeding Lungs: Clear to auscultation bilaterally CVS: Regular rate and rhythm, no murmur GI/Abd soft, nontender, nondistended, bowel sound  present CNS: Alert, awake, oriented x3 Psychiatry: Mood appropriate Extremities: No pedal edema, no calf tenderness  Data Review: I have personally reviewed the laboratory data and studies available.  Recent Labs  Lab 02/04/20 1227 02/04/20 1326  WBC 7.2  --   NEUTROABS 5.1  --   HGB 15.0 14.6  HCT 43.3 43.0  MCV 94.5  --   PLT 320  --    Recent Labs  Lab 02/04/20 1227 02/04/20 1326  NA 134* 132*  K 3.6 3.4*  CL 98 95*  CO2 24  --   GLUCOSE 105* 99  BUN 12 16  CREATININE 0.95 0.90  CALCIUM 9.0  --    Addendum: Patient was tentatively planned for discharge on 2/17 after stroke work-up was completed.  Immediate discharge summary ready.  However, plan were changed after vascular surgery recommended Rt CEA to be done on Friday 2/19.  Discharge was canceled and patient was kept in the hospital.  Terrilee Croak, MD  [facility] 02/04/2020

## 2020-02-05 NOTE — Progress Notes (Signed)
STROKE TEAM PROGRESS NOTE   INTERVAL HISTORY His RN is at the bedside.  He presented with sudden onset of right eye painless vision loss which appears to have partially improved and now he is able to see in the temporal field in the right eye.  He denies any prior history of strokes or TIAs or known carotid stenosis.  CT angiogram shows 50 to 60% right carotid stenosis and carotid ultrasound also confirms this.  Vitals:   02/04/20 2126 02/04/20 2320 02/04/20 2350 02/05/20 0500  BP: (!) 173/90 (!) 158/77 (!) 157/82 117/83  Pulse: 70 68 69 65  Resp:  14 16 17   Temp:   98.2 F (36.8 C) 98.4 F (36.9 C)  TempSrc:   Oral Oral  SpO2: 96% 94% 97% 97%    CBC:  Recent Labs  Lab 02/04/20 1227 02/04/20 1326  WBC 7.2  --   NEUTROABS 5.1  --   HGB 15.0 14.6  HCT 43.3 43.0  MCV 94.5  --   PLT 320  --     Basic Metabolic Panel:  Recent Labs  Lab 02/04/20 1227 02/04/20 1326  NA 134* 132*  K 3.6 3.4*  CL 98 95*  CO2 24  --   GLUCOSE 105* 99  BUN 12 16  CREATININE 0.95 0.90  CALCIUM 9.0  --    Lipid Panel:     Component Value Date/Time   CHOL 213 (H) 02/04/2020 2305   TRIG 280 (H) 02/04/2020 2305   HDL 29 (L) 02/04/2020 2305   CHOLHDL 7.3 02/04/2020 2305   VLDL 56 (H) 02/04/2020 2305   LDLCALC 128 (H) 02/04/2020 2305   HgbA1c:  Lab Results  Component Value Date   HGBA1C 5.4 02/04/2020   Urine Drug Screen: No results found for: LABOPIA, COCAINSCRNUR, LABBENZ, AMPHETMU, THCU, LABBARB  Alcohol Level No results found for: ETH  IMAGING past 48 hours CT ANGIO HEAD W OR WO CONTRAST  Result Date: 02/04/2020 CLINICAL DATA:  Stroke follow-up EXAM: CT ANGIOGRAPHY HEAD AND NECK TECHNIQUE: Multidetector CT imaging of the head and neck was performed using the standard protocol during bolus administration of intravenous contrast. Multiplanar CT image reconstructions and MIPs were obtained to evaluate the vascular anatomy. Carotid stenosis measurements (when applicable) are obtained  utilizing NASCET criteria, using the distal internal carotid diameter as the denominator. CONTRAST:  15mL OMNIPAQUE IOHEXOL 350 MG/ML SOLN COMPARISON:  None. FINDINGS: CTA NECK FINDINGS SKELETON: 1 OTHER NECK: Normal pharynx, larynx and major salivary glands. No cervical lymphadenopathy. Enlarged left lobe of the thyroid gland. UPPER CHEST: No pneumothorax or pleural effusion. No nodules or masses. AORTIC ARCH: There is no calcific atherosclerosis of the aortic arch. There is no aneurysm, dissection or hemodynamically significant stenosis of the visualized portion of the aorta. Conventional 3 vessel aortic branching pattern. The visualized proximal subclavian arteries are widely patent. RIGHT CAROTID SYSTEM: No dissection, occlusion or aneurysm. There is mixed density atherosclerosis extending into the proximal ICA, resulting in 58% stenosis. LEFT CAROTID SYSTEM: No dissection, occlusion or aneurysm. Mild atherosclerotic calcification at the carotid bifurcation without hemodynamically significant stenosis. VERTEBRAL ARTERIES: Left dominant configuration. Both origins are clearly patent. There is no dissection, occlusion or flow-limiting stenosis to the skull base (V1-V3 segments). CTA HEAD FINDINGS POSTERIOR CIRCULATION: --Vertebral arteries: Normal V4 segments. --Posterior inferior cerebellar arteries (PICA): Patent origins from the vertebral arteries. --Anterior inferior cerebellar arteries (AICA): Patent origins from the basilar artery. --Basilar artery: Normal. --Superior cerebellar arteries: Normal. --Posterior cerebral arteries: Normal. Both originate from  the basilar artery. Posterior communicating arteries (p-comm) are diminutive or absent. ANTERIOR CIRCULATION: --Intracranial internal carotid arteries: Normal. --Anterior cerebral arteries (ACA): Normal. Both A1 segments are present. Patent anterior communicating artery (a-comm). --Middle cerebral arteries (MCA): Normal. VENOUS SINUSES: As permitted by  contrast timing, patent. ANATOMIC VARIANTS: None Review of the MIP images confirms the above findings. IMPRESSION: 1. No intracranial arterial occlusion or hemodynamically significant stenosis. 2. There is 58% stenosis of the proximal right ICA secondary to mixed density atherosclerosis. 3. Enlarged left thyroid lobe. Electronically Signed   By: Ulyses Jarred M.D.   On: 02/04/2020 19:52   CT HEAD WO CONTRAST  Result Date: 02/04/2020 CLINICAL DATA:  Acute neurological deficit. EXAM: CT HEAD WITHOUT CONTRAST TECHNIQUE: Contiguous axial images were obtained from the base of the skull through the vertex without intravenous contrast. COMPARISON:  None. FINDINGS: Brain: No evidence of acute infarction, hemorrhage, extra-axial collection, ventriculomegaly, or mass effect. Generalized cerebral atrophy. Periventricular white matter low attenuation likely secondary to microangiopathy. Vascular: Cerebrovascular atherosclerotic calcifications are noted. Skull: Negative for fracture or focal lesion. Sinuses/Orbits: Visualized portions of the orbits are unremarkable. Visualized portions of the paranasal sinuses and mastoid air cells are unremarkable. Other: None. IMPRESSION: 1. No acute intracranial pathology. 2. Chronic microvascular disease and cerebral atrophy. Electronically Signed   By: Kathreen Devoid   On: 02/04/2020 15:27   CT ANGIO NECK W OR WO CONTRAST  Result Date: 02/04/2020 CLINICAL DATA:  Stroke follow-up EXAM: CT ANGIOGRAPHY HEAD AND NECK TECHNIQUE: Multidetector CT imaging of the head and neck was performed using the standard protocol during bolus administration of intravenous contrast. Multiplanar CT image reconstructions and MIPs were obtained to evaluate the vascular anatomy. Carotid stenosis measurements (when applicable) are obtained utilizing NASCET criteria, using the distal internal carotid diameter as the denominator. CONTRAST:  113mL OMNIPAQUE IOHEXOL 350 MG/ML SOLN COMPARISON:  None. FINDINGS: CTA  NECK FINDINGS SKELETON: 1 OTHER NECK: Normal pharynx, larynx and major salivary glands. No cervical lymphadenopathy. Enlarged left lobe of the thyroid gland. UPPER CHEST: No pneumothorax or pleural effusion. No nodules or masses. AORTIC ARCH: There is no calcific atherosclerosis of the aortic arch. There is no aneurysm, dissection or hemodynamically significant stenosis of the visualized portion of the aorta. Conventional 3 vessel aortic branching pattern. The visualized proximal subclavian arteries are widely patent. RIGHT CAROTID SYSTEM: No dissection, occlusion or aneurysm. There is mixed density atherosclerosis extending into the proximal ICA, resulting in 58% stenosis. LEFT CAROTID SYSTEM: No dissection, occlusion or aneurysm. Mild atherosclerotic calcification at the carotid bifurcation without hemodynamically significant stenosis. VERTEBRAL ARTERIES: Left dominant configuration. Both origins are clearly patent. There is no dissection, occlusion or flow-limiting stenosis to the skull base (V1-V3 segments). CTA HEAD FINDINGS POSTERIOR CIRCULATION: --Vertebral arteries: Normal V4 segments. --Posterior inferior cerebellar arteries (PICA): Patent origins from the vertebral arteries. --Anterior inferior cerebellar arteries (AICA): Patent origins from the basilar artery. --Basilar artery: Normal. --Superior cerebellar arteries: Normal. --Posterior cerebral arteries: Normal. Both originate from the basilar artery. Posterior communicating arteries (p-comm) are diminutive or absent. ANTERIOR CIRCULATION: --Intracranial internal carotid arteries: Normal. --Anterior cerebral arteries (ACA): Normal. Both A1 segments are present. Patent anterior communicating artery (a-comm). --Middle cerebral arteries (MCA): Normal. VENOUS SINUSES: As permitted by contrast timing, patent. ANATOMIC VARIANTS: None Review of the MIP images confirms the above findings. IMPRESSION: 1. No intracranial arterial occlusion or hemodynamically  significant stenosis. 2. There is 58% stenosis of the proximal right ICA secondary to mixed density atherosclerosis. 3. Enlarged left thyroid lobe. Electronically Signed  By: Ulyses Jarred M.D.   On: 02/04/2020 19:52   MR BRAIN WO CONTRAST  Result Date: 02/04/2020 CLINICAL DATA:  Stroke follow-up. Central retinal artery occlusion. EXAM: MRI HEAD WITHOUT CONTRAST TECHNIQUE: Multiplanar, multiecho pulse sequences of the brain and surrounding structures were obtained without intravenous contrast. COMPARISON:  None. FINDINGS: Brain: No acute infarct, acute hemorrhage or extra-axial collection. Multifocal white matter hyperintensity, most commonly due to chronic ischemic microangiopathy. Normal volume of CSF spaces. No chronic microhemorrhage. Normal midline structures. Vascular: Normal flow voids. Skull and upper cervical spine: Normal marrow signal. Sinuses/Orbits: Negative. Other: None. IMPRESSION: 1. No acute intracranial abnormality. 2. Multifocal white matter hyperintensity, most commonly due to chronic ischemic microangiopathy. Electronically Signed   By: Ulyses Jarred M.D.   On: 02/04/2020 19:20    PHYSICAL EXAM Pleasant elderly Caucasian male not in distress. . Afebrile. Head is nontraumatic. Neck is supple without bruit.    Cardiac exam no murmur or gallop. Lungs are clear to auscultation. Distal pulses are well felt. Neurological Exam ;  Awake  Alert oriented x 3. Normal speech and language.eye movements full without nystagmus.fundi were not visualized.  Right eye significantly impaired vision with only small right temporal crescentic vision present.  Normal vision in the left eye.[name] Hearing is normal. Palatal movements are normal. Face symmetric. Tongue midline. Normal strength, tone, reflexes and coordination. Normal sensation. Gait deferred.  ASSESSMENT/PLAN Jay Tran is a 86 y.o. male with history of lumbar stenosis, hypertension, hyperlipidemia, DVT of lower extremity presenting  after waking with painless OD vision loss.   CRAO OD, likely thromboembolic from unstable R ICA stenosis    CT head No acute abnormality. Small vessel disease. Atrophy.     MRI  No acute abnormality. Small vessel disease.   CTA head no significant stenosis  CTA neck proximal R ICA 58% stenosis w/ mixed atherosclerosis   Carotid Doppler  R ICA 40-59% stenosis   2D Echo pending  LDL 128  HgbA1c 5.4  Lovenox 40 mg sq daily for VTE prophylaxis  No antithrombotic prior to admission, now on aspirin 324 mg daily. Change to aspirin 81 and plavix 75 my daily. Continue DAPT x 3 weeks then aspirin alone    Therapy recommendations:  HH PT and likely HH OT  Disposition:  Return home  [place] for d/c today  Symptomatic R Carotid Stenosis  CTA neck proximal R ICA 58% stenosis w/ mixed atherosclerosis   Carotid Doppler  R ICA 40-59% stenosis   Recommend VVS consult - Geovonni Meyerhoff contacted Dr. Trula Slade  Hypertension  Stabl but elevated . BP goal normotensive  Hyperlipidemia  Home meds:  No statin  Now on lipitor 40  LDL 128, goal < 70  Continue statin at discharge  Other Stroke Risk Factors  Advanced age  Former Cigarette smoker, quit 56 yrs ago  for requested labs within last 26280 hours., Cocaine No results found for requested labs within last 26280 hours.. Patient advised to stop using due to stroke risk.  Overweight, There is no height or weight on file to calculate BMI., recommend weight loss, diet and exercise as appropriate   Family hx stroke (mother)  Hx DVT RLE provoked following shoulder surgery many years ago w/o recurrence  Other Active Problems  BPH  GERD  Chronic back pain  Hospital day # 1  I have personally obtained history,examined this patient, reviewed notes, independently viewed imaging studies, participated in medical decision making and plan of care.ROS completed by me personally and pertinent  positives fully documented  I have made any additions  or clarifications directly to the above note.  He presented with painless right eye sudden vision loss likely from central retinal artery occlusion symptomatic from moderate proximal right carotid stenosis.  Recommend aspirin and Plavix for 3 months and consider emergent right carotid revascularization.  I spoke to Dr. Marvell Fuller vascular surgeon who will consult and plan surgery soon.  Discussed with Dr. Pietro Cassis    Greater than 50% time during this 35-minute visit was spent on counseling and coordination of care and discussion about stroke prevention treatment and answering questions.  Antony Contras, MD Medical Director [facility] Pager: [phone] 02/05/2020 3:54 PM   To contact Stroke Continuity provider, please refer to [email or web address] After hours, contact General Neurology

## 2020-02-05 NOTE — Evaluation (Signed)
Physical Therapy Evaluation Patient Details Name: Jay Tran MRN: [id number] DOB: [birth year] Today's Date: 02/05/2020   History of Present Illness  86 y.o. male with medical history significant for hypertension, hyperlipidemia, macular degeneration and chronic back pain (multi lumbar surgeries) who presents with concerns of right eye vision loss. Of note, pt also reports bulging lumbar disc. He is scheduled to receive his third spinal injection 2/25.    Clinical Impression  Pt admitted with above diagnosis. PTA pt lived at home with his wife, independent mobility. He does report recent falls due to LE weakness that resulted from bulging disc/back pain. Pt is currently undergoing treatment with his neurosurgeon for the bulging disc. On eval, pt required min guard assist transfers and min guard/HHA ambulation 200'. Pt has SPC and RW at home. Relating to his vision, pt reports only being able to see peripherally out of his right eye. 2/16 MRI negative. Pt currently with functional limitations due to the deficits listed below (see PT Problem List). Pt will benefit from skilled PT to increase their independence and safety with mobility to allow discharge to the venue listed below.       Follow Up Recommendations Home health PT;Supervision for mobility/OOB    Equipment Recommendations  None recommended by PT    Recommendations for Other Services       Precautions / Restrictions Precautions Precautions: Fall Precaution Comments: back precautions for comfort      Mobility  Bed Mobility Overal bed mobility: Modified Independent             General bed mobility comments: +rail, increased time, cues for logroll  Transfers Overall transfer level: Needs assistance Equipment used: None Transfers: Sit to/from Stand Sit to Stand: Min guard         General transfer comment: increased time to stabilize initial standing balance  Ambulation/Gait Ambulation/Gait assistance: Min  guard Gait Distance (Feet): 200 Feet Assistive device: 1 person hand held assist;None Gait Pattern/deviations: Step-through pattern;Decreased stride length Gait velocity: decreased Gait velocity interpretation: <1.31 ft/sec, indicative of household ambulator General Gait Details: HHA provided for first 100' to simulate cane. No AD for second 100'. Pt has SPC and RW at home for use as needed.  Stairs            Wheelchair Mobility    Modified Rankin (Stroke Patients Only)       Balance Overall balance assessment: Needs assistance Sitting-balance support: No upper extremity supported;Feet supported Sitting balance-Leahy Scale: Good     Standing balance support: No upper extremity supported;Single extremity supported;During functional activity Standing balance-Leahy Scale: Fair Standing balance comment: mildly unsteady. No AD for gait.                             Pertinent Vitals/Pain Pain Assessment: 0-10 Pain Score: 3  Pain Location: back Pain Descriptors / Indicators: Sore;Grimacing;Guarding Pain Intervention(s): Monitored during session;Limited activity within patient's tolerance;Repositioned    Home Living Family/patient expects to be discharged to:: Private residence Living Arrangements: Spouse/significant other Available Help at Discharge: Family;Available 24 hours/day Type of Home: House Home Access: Stairs to enter Entrance Stairs-Rails: Left Entrance Stairs-Number of Steps: 4 Home Layout: Able to live on main level with bedroom/bathroom;Laundry or work area in [place]: Environmental consultant - 2 wheels;Cane - single point;Bedside commode Additional Comments: back brace    Prior Function Level of Independence: Independent         Comments: reports 3-4 falls in  last 6 months     Hand Dominance   Dominant Hand: Right    Extremity/Trunk Assessment   Upper Extremity Assessment Upper Extremity Assessment: Defer to OT evaluation     Lower Extremity Assessment Lower Extremity Assessment: Generalized weakness(sensation intact)    Cervical / Trunk Assessment Cervical / Trunk Assessment: Other exceptions Cervical / Trunk Exceptions: s/p multi back surgeries  Communication   Communication: HOH  Cognition Arousal/Alertness: Awake/alert Behavior During Therapy: WFL for tasks assessed/performed Overall Cognitive Status: Within Functional Limits for tasks assessed                                        General Comments General comments (skin integrity, edema, etc.): For the right eye, pt reports only being able to see peripherally to the right. 02/04/20 MRI negative.    Exercises     Assessment/Plan    PT Assessment Patient needs continued PT services  PT Problem List Decreased strength;Decreased mobility;Pain;Decreased balance;Decreased activity tolerance       PT Treatment Interventions DME instruction;Therapeutic activities;Gait training;Therapeutic exercise;Patient/family education;Stair training;Balance training;Functional mobility training    PT Goals (Current goals can be found in the Care Plan section)  Acute Rehab PT Goals Patient Stated Goal: home today PT Goal Formulation: With patient Time For Goal Achievement: 02/19/20 Potential to Achieve Goals: Good    Frequency Min 3X/week   Barriers to discharge        Co-evaluation               AM-PAC PT "6 Clicks" Mobility  Outcome Measure Help needed turning from your back to your side while in a flat bed without using bedrails?: A Little Help needed moving from lying on your back to sitting on the side of a flat bed without using bedrails?: A Little Help needed moving to and from a bed to a chair (including a wheelchair)?: A Little Help needed standing up from a chair using your arms (e.g., wheelchair or bedside chair)?: A Little Help needed to walk in hospital room?: A Little Help needed climbing 3-5 steps with a railing?  : A Little 6 Click Score: 18    End of Session Equipment Utilized During Treatment: Gait belt Activity Tolerance: Patient tolerated treatment well Patient left: in bed;with call bell/phone within reach Nurse Communication: Mobility status PT Visit Diagnosis: Unsteadiness on feet (R26.81);Difficulty in walking, not elsewhere classified (R26.2);History of falling (Z91.81)    Time: [id number] PT Time Calculation (min) (ACUTE ONLY): 24 min   Charges:   PT Evaluation $PT Eval Moderate Complexity: 1 Mod PT Treatments $Gait Training: 8-22 mins        Jay Tran, PT  Office # [phone] Pager [phone]   Lorriane Shire 02/05/2020, 8:39 AM

## 2020-02-05 NOTE — Progress Notes (Signed)
Occupational Therapy Evaluation Patient Details Name: Jay Tran MRN: [id number] DOB: [birth year] Today's Date: 02/05/2020    History of Present Illness 86 y.o. male with medical history significant for hypertension, hyperlipidemia, macular degeneration and chronic back pain (multi lumbar surgeries) who presents with concerns of right eye vision loss. Of note, pt also reports bulging lumbar disc. He is scheduled to receive his third spinal injection 2/25.   Clinical Impression   PTA, pt independent with ADL and mobility although he states he has had multiple falls lately due to LE weakness from "disc problem". States he has "some balance problems" at baseline. Pt with near fall during session, requiring mod- Max A from therapist to prevent fall when ambulating. Pt's vision impairment likely exacerbating existing balance deficits. Recommend S with all mobility and HHOT. Discussed with pt/wife . Will follow acutely to facilitate safe DC home. Recommend pt ambulate with staff with use of RW.     Follow Up Recommendations  Home health OT;Supervision/Assistance - 24 hour    Equipment Recommendations  None recommended by OT    Recommendations for Other Services       Precautions / Restrictions Precautions Precautions: Fall      Mobility Bed Mobility Overal bed mobility: Modified Independent                Transfers Overall transfer level: Needs assistance   Transfers: Sit to/from Stand;Stand Pivot Transfers Sit to Stand: Min guard Stand pivot transfers: Min assist       General transfer comment: loss of balance noted    Balance Overall balance assessment: Needs assistance   Sitting balance-Leahy Scale: Good       Standing balance-Leahy Scale: Poor                             ADL either performed or assessed with clinical judgement   ADL Overall ADL's : Needs assistance/impaired     Grooming: Supervision/safety;Standing   Upper Body Bathing:  Supervision/ safety;Sitting   Lower Body Bathing: Min guard;Sit to/from stand   Upper Body Dressing : Set up;Supervision/safety   Lower Body Dressing: Min guard;Sit to/from stand   Toilet Transfer: Min guard;Ambulation   Toileting- Clothing Manipulation and Hygiene: Supervision/safety;Sit to/from stand       Functional mobility during ADLs: Rolling walker;Cueing for safety;Min guard General ADL Comments: Pt with loss of balance to the Right requiring mod A to catch pt adn prevent fall     Vision Baseline Vision/History: Macular Degeneration;Wears glasses(Retinal artery occlusion R eye; MD R eye) Wears Glasses: At all times Patient Visual Report: Other (comment)(only able to see lateral rim of vision R eye) Vision Assessment?: Yes Eye Alignment: Within Functional Limits Ocular Range of Motion: Within Functional Limits Alignment/Gaze Preference: Within Defined Limits Tracking/Visual Pursuits: Decreased smoothness of horizontal tracking;Decreased smoothness of vertical tracking Saccades: Additional eye shifts occurred during testing Visual Fields: Right visual field deficit Depth Perception: (impaired)     Perception     Praxis      Pertinent Vitals/Pain Pain Assessment: No/denies pain     Hand Dominance Right   Extremity/Trunk Assessment Upper Extremity Assessment Upper Extremity Assessment: Overall WFL for tasks assessed   Lower Extremity Assessment Lower Extremity Assessment: Defer to PT evaluation   Cervical / Trunk Assessment Cervical / Trunk Assessment: Other exceptions(herniated disc)   Communication Communication Communication: HOH   Cognition Arousal/Alertness: Awake/alert Behavior During Therapy: WFL for tasks assessed/performed Overall Cognitive Status: Within  Functional Limits for tasks assessed                                 General Comments: most likely baseline; wife manages medicine; pt still driving   General Comments  improved  safety with use of RW    Exercises     Shoulder Instructions      Home Living Family/patient expects to be discharged to:: Private residence Living Arrangements: Spouse/significant other Available Help at Discharge: Family;Available 24 hours/day Type of Home: House Home Access: Stairs to enter [employer] of Steps: 4 Entrance Stairs-Rails: Left Home Layout: Able to live on main level with bedroom/bathroom;Laundry or work area in [employer] Shower/Tub: Tub/shower unit;Door   [employer] Toilet: Standard Bathroom Accessibility: Yes How Accessible: Accessible via walker Home Equipment: Walker - 2 wheels;Cane - single point;Bedside commode      Lives With: Spouse    Prior Functioning/Environment Level of Independence: Independent        Comments: reports 3-4 falls in last 6 months        OT Problem List: Decreased strength;Impaired balance (sitting and/or standing);Decreased safety awareness;Decreased knowledge of use of DME or AE;Impaired vision/perception      OT Treatment/Interventions: Self-care/ADL training;Therapeutic exercise;DME and/or AE instruction;Therapeutic activities;Visual/perceptual remediation/compensation;Balance training;Patient/family education    OT Goals(Current goals can be found in the care plan section) Acute Rehab OT Goals Patient Stated Goal: home today OT Goal Formulation: With patient/family Time For Goal Achievement: 02/19/20 Potential to Achieve Goals: Good  OT Frequency: Min 3X/week   Barriers to D/C:            Co-evaluation              AM-PAC OT "6 Clicks" Daily Activity     Outcome Measure Help from another person eating meals?: None Help from another person taking care of personal grooming?: A Little Help from another person toileting, which includes using toliet, bedpan, or urinal?: A Little Help from another person bathing (including washing, rinsing, drying)?: A Little Help from another person  to put on and taking off regular upper body clothing?: A Little Help from another person to put on and taking off regular lower body clothing?: A Little 6 Click Score: 19   End of Session Equipment Utilized During Treatment: Rolling walker;Gait belt Nurse Communication: Mobility status;Other (comment)(DC needs)  Activity Tolerance: Patient tolerated treatment well Patient left: in bed;with call bell/phone within reach;with bed alarm set;with family/visitor present  OT Visit Diagnosis: Unsteadiness on feet (R26.81);Other abnormalities of gait and mobility (R26.89);Muscle weakness (generalized) (M62.81);History of falling (Z91.81)                Time: [id number] OT Time Calculation (min): 29 min Charges:  OT General Charges $OT Visit: 1 Visit OT Evaluation $OT Eval Moderate Complexity: 1 Mod OT Treatments $Self Care/Home Management : 8-22 mins  Maurie Boettcher, OT/L   Acute OT Clinical Specialist Acute Rehabilitation Services Pager [phone] Office [phone]   [facility] 02/05/2020, 3:56 PM

## 2020-02-06 LAB — SURGICAL PCR SCREEN
MRSA, PCR: NEGATIVE
Staphylococcus aureus: NEGATIVE

## 2020-02-06 MED ORDER — CEFAZOLIN SODIUM-DEXTROSE 2-4 GM/100ML-% IV SOLN
2.0000 g | Freq: Three times a day (TID) | INTRAVENOUS | Status: DC
  Administered 2020-02-07: 2 g via INTRAVENOUS
  Filled 2020-02-06 (×3): qty 100

## 2020-02-06 NOTE — Progress Notes (Signed)
PROGRESS NOTE  Jay Tran  DOB: 1934/12/17  PCP: Leonard Downing, MD [id number]  DOA: 02/04/2020 Admitted From: Home  LOS: 2 days   No chief complaint on file.  Brief narrative: Patient is an 86 y.o. male with a history of lumbar stenosis, hypertension, hyperlipidemia, DVT of lower extremity. Patient was sent to the ED by his Ophthalmologist for stroke work-up after found to have a right sided CRAO (central retinal artery occlusion).   2/15, patient went to bed in his usual state of health.  Next morning he woke up completely blind in his right eye. He immediately called his Ophthalmologist, got evaluated in the office and was found to have central retinal artery occlusion.  Patient was immediately sent to the hospital to be evaluated for stroke.   In the ED, patient was afebrile, intermittently hypertensive up to systolic of [id number] on room air. CBC and BMP otherwise unremarkable.   CT head negative.   MRI brain negative. CTA head and neck showed 58% stenosis of the proximal right ICA.  Patient was admitted under hospitalist service for further stroke work-up. Neurology consult appreciated  Subjective: Patient was seen and examined in the morning.  Not in distress.  Vision was improving to yesterday and he was able to see on the temporal side of right eye but has not seen any progress since last night.  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 -Found to have CRAO ophthalmologist. -Admitted for stroke work-up.   -CT head negative.   -MRI brain negative. -CTA head and neck showed 58% stenosis of the proximal right ICA. -echocardiogram pending. -Started on daily aspirin and atorvastatin -Hemoglobin A1c 5.4, LDL low at 29, LDL elevated to 128. -PT/OT/ST eval obtained.  Home with home implant. -Continue frequent neuro checks and keep on telemetry -Patient states his right eye vision was improving to yesterday and he was able  to see on the temporal side of right eye but has not seen any progress since last night.  Proximal right ICA stenosis -58% stenosis -Vascular surgery consult appreciated.  Right CEA planned for tomorrow 2/19.  HTN HCTZ and lisinopril remain on hold.  I will keep him on hold perioperatively as well. Also permissive hypertension for 48 to 72 hours post stroke.  BPH  Continue Proscar and Flomax   GERD Continue PPI  Chronic back pain Continue PRN Tramadol and Tizanidine  DVT prophylaxis:  Lovenox subcu Antimicrobials:  None Fluid: None Diet: Cardiac diet  Code Status:  Full code Mobility: PT/OT eval obtained Family Communication:  Updated patient's wife yesterday. Discharge plan:  Anticipated date: Not clear at this time. Disposition: Likely home postsurgically Barriers: Pending right CVA on 2/19.  Consultants:  Neurology  Vascular surgery  Procedures:  None  Antimicrobials: Anti-infectives (From admission, onward)   Start     Dose/Rate Route Frequency Ordered Stop   02/07/20 1000  ceFAZolin (ANCEF) IVPB 2g/100 mL premix     2 g 200 mL/hr over 30 Minutes Intravenous Every 8 hours 02/06/20 1117 02/08/20 0159        Code Status: Full Code   Diet Order            Diet NPO time specified  Diet effective midnight        Diet - low sodium heart healthy        Diet Heart Room service appropriate? Yes; Fluid consistency: Thin  Diet effective now  Infusions:  . [START ON 02/07/2020]  ceFAZolin (ANCEF) IV      Scheduled Meds: .  stroke: mapping our early stages of recovery book   Does not apply Once  . aspirin  81 mg Oral Daily  . atorvastatin  40 mg Oral q1800  . clopidogrel  75 mg Oral Daily  . enoxaparin (LOVENOX) injection  40 mg Subcutaneous Q24H  . finasteride  5 mg Oral QHS  . pantoprazole  40 mg Oral Daily  . tamsulosin  0.4 mg Oral QHS    PRN meds: acetaminophen **OR** acetaminophen (TYLENOL) oral liquid 160 mg/5 mL **OR**  acetaminophen, labetalol, tiZANidine, traMADol   Objective: Vitals:   02/06/20 0511 02/06/20 1309  BP: 128/70 119/70  Pulse: 69 73  Resp: 18 18  Temp: 98 F (36.7 C) 98.4 F (36.9 C)  SpO2: 94% 95%    Intake/Output Summary (Last 24 hours) at 02/06/2020 1429 Last data filed at 02/06/2020 0500 Gross per 24 hour  Intake 240 ml  Output 100 ml  Net 140 ml   Filed Weights   02/05/20 1030  Weight: 86.8 kg   Weight change:  Body mass index is 27.46 kg/m.   Physical Exam: General exam: Appears calm and comfortable.  Skin: No rashes, lesions or ulcers. HEENT: Atraumatic, normocephalic, supple neck, no obvious bleeding Lungs: Clear to auscultation bilaterally CVS: Regular rate and rhythm, no murmur GI/Abd soft, nontender, nondistended, bowel sound present CNS: Alert, awake, oriented x3.  Improving on the right temporal field Psychiatry: Mood appropriate Extremities: No pedal edema, no calf tenderness  Data Review: I have personally reviewed the laboratory data and studies available.  Recent Labs  Lab 02/04/20 1227 02/04/20 1326  WBC 7.2  --   NEUTROABS 5.1  --   HGB 15.0 14.6  HCT 43.3 43.0  MCV 94.5  --   PLT 320  --    Recent Labs  Lab 02/04/20 1227 02/04/20 1326  NA 134* 132*  K 3.6 3.4*  CL 98 95*  CO2 24  --   GLUCOSE 105* 99  BUN 12 16  CREATININE 0.95 0.90  CALCIUM 9.0  --    Terrilee Croak, MD  [facility] 02/04/2020

## 2020-02-06 NOTE — Progress Notes (Signed)
STROKE TEAM PROGRESS NOTE   INTERVAL HISTORY He is sitting up in bed looks comfortable.  He continues to have poor vision in the right eye and can see only through a small crescentic area.  Carotid ultrasound confirmed 50 to 69% right ICA stenosis.  He has been seen by vascular surgeon Dr. Trula Slade who plans to do right carotid endarterectomy tomorrow.  Vitals:   02/05/20 1603 02/06/20 0019 02/06/20 0511 02/06/20 1309  BP: (!) 168/80 (!) 156/83 128/70 119/70  Pulse: 73 74 69 73  Resp: 18 18 18 18   Temp: 98.4 F (36.9 C) 97.9 F (36.6 C) 98 F (36.7 C) 98.4 F (36.9 C)  TempSrc: Oral Oral Oral Oral  SpO2: 97% 96% 94% 95%  Weight:      Height:        CBC:  Recent Labs  Lab 02/04/20 1227 02/04/20 1326  WBC 7.2  --   NEUTROABS 5.1  --   HGB 15.0 14.6  HCT 43.3 43.0  MCV 94.5  --   PLT 320  --     Basic Metabolic Panel:  Recent Labs  Lab 02/04/20 1227 02/04/20 1326  NA 134* 132*  K 3.6 3.4*  CL 98 95*  CO2 24  --   GLUCOSE 105* 99  BUN 12 16  CREATININE 0.95 0.90  CALCIUM 9.0  --    Lipid Panel:     Component Value Date/Time   CHOL 213 (H) 02/04/2020 2305   TRIG 280 (H) 02/04/2020 2305   HDL 29 (L) 02/04/2020 2305   CHOLHDL 7.3 02/04/2020 2305   VLDL 56 (H) 02/04/2020 2305   LDLCALC 128 (H) 02/04/2020 2305   HgbA1c:  Lab Results  Component Value Date   HGBA1C 5.4 02/04/2020   Urine Drug Screen: No results found for: LABOPIA, COCAINSCRNUR, LABBENZ, AMPHETMU, THCU, LABBARB  Alcohol Level No results found for: ETH  IMAGING past 48 hours CT ANGIO HEAD W OR WO CONTRAST  Result Date: 02/04/2020 CLINICAL DATA:  Stroke follow-up EXAM: CT ANGIOGRAPHY HEAD AND NECK TECHNIQUE: Multidetector CT imaging of the head and neck was performed using the standard protocol during bolus administration of intravenous contrast. Multiplanar CT image reconstructions and MIPs were obtained to evaluate the vascular anatomy. Carotid stenosis measurements (when applicable) are  obtained utilizing NASCET criteria, using the distal internal carotid diameter as the denominator. CONTRAST:  157mL OMNIPAQUE IOHEXOL 350 MG/ML SOLN COMPARISON:  None. FINDINGS: CTA NECK FINDINGS SKELETON: 1 OTHER NECK: Normal pharynx, larynx and major salivary glands. No cervical lymphadenopathy. Enlarged left lobe of the thyroid gland. UPPER CHEST: No pneumothorax or pleural effusion. No nodules or masses. AORTIC ARCH: There is no calcific atherosclerosis of the aortic arch. There is no aneurysm, dissection or hemodynamically significant stenosis of the visualized portion of the aorta. Conventional 3 vessel aortic branching pattern. The visualized proximal subclavian arteries are widely patent. RIGHT CAROTID SYSTEM: No dissection, occlusion or aneurysm. There is mixed density atherosclerosis extending into the proximal ICA, resulting in 58% stenosis. LEFT CAROTID SYSTEM: No dissection, occlusion or aneurysm. Mild atherosclerotic calcification at the carotid bifurcation without hemodynamically significant stenosis. VERTEBRAL ARTERIES: Left dominant configuration. Both origins are clearly patent. There is no dissection, occlusion or flow-limiting stenosis to the skull base (V1-V3 segments). CTA HEAD FINDINGS POSTERIOR CIRCULATION: --Vertebral arteries: Normal V4 segments. --Posterior inferior cerebellar arteries (PICA): Patent origins from the vertebral arteries. --Anterior inferior cerebellar arteries (AICA): Patent origins from the basilar artery. --Basilar artery: Normal. --Superior cerebellar arteries: Normal. --Posterior cerebral arteries:  Normal. Both originate from the basilar artery. Posterior communicating arteries (p-comm) are diminutive or absent. ANTERIOR CIRCULATION: --Intracranial internal carotid arteries: Normal. --Anterior cerebral arteries (ACA): Normal. Both A1 segments are present. Patent anterior communicating artery (a-comm). --Middle cerebral arteries (MCA): Normal. VENOUS SINUSES: As  permitted by contrast timing, patent. ANATOMIC VARIANTS: None Review of the MIP images confirms the above findings. IMPRESSION: 1. No intracranial arterial occlusion or hemodynamically significant stenosis. 2. There is 58% stenosis of the proximal right ICA secondary to mixed density atherosclerosis. 3. Enlarged left thyroid lobe. Electronically Signed   By: Ulyses Jarred M.D.   On: 02/04/2020 19:52   CT HEAD WO CONTRAST  Result Date: 02/04/2020 CLINICAL DATA:  Acute neurological deficit. EXAM: CT HEAD WITHOUT CONTRAST TECHNIQUE: Contiguous axial images were obtained from the base of the skull through the vertex without intravenous contrast. COMPARISON:  None. FINDINGS: Brain: No evidence of acute infarction, hemorrhage, extra-axial collection, ventriculomegaly, or mass effect. Generalized cerebral atrophy. Periventricular white matter low attenuation likely secondary to microangiopathy. Vascular: Cerebrovascular atherosclerotic calcifications are noted. Skull: Negative for fracture or focal lesion. Sinuses/Orbits: Visualized portions of the orbits are unremarkable. Visualized portions of the paranasal sinuses and mastoid air cells are unremarkable. Other: None. IMPRESSION: 1. No acute intracranial pathology. 2. Chronic microvascular disease and cerebral atrophy. Electronically Signed   By: Kathreen Devoid   On: 02/04/2020 15:27   CT ANGIO NECK W OR WO CONTRAST  Result Date: 02/04/2020 CLINICAL DATA:  Stroke follow-up EXAM: CT ANGIOGRAPHY HEAD AND NECK TECHNIQUE: Multidetector CT imaging of the head and neck was performed using the standard protocol during bolus administration of intravenous contrast. Multiplanar CT image reconstructions and MIPs were obtained to evaluate the vascular anatomy. Carotid stenosis measurements (when applicable) are obtained utilizing NASCET criteria, using the distal internal carotid diameter as the denominator. CONTRAST:  123mL OMNIPAQUE IOHEXOL 350 MG/ML SOLN COMPARISON:  None.  FINDINGS: CTA NECK FINDINGS SKELETON: 1 OTHER NECK: Normal pharynx, larynx and major salivary glands. No cervical lymphadenopathy. Enlarged left lobe of the thyroid gland. UPPER CHEST: No pneumothorax or pleural effusion. No nodules or masses. AORTIC ARCH: There is no calcific atherosclerosis of the aortic arch. There is no aneurysm, dissection or hemodynamically significant stenosis of the visualized portion of the aorta. Conventional 3 vessel aortic branching pattern. The visualized proximal subclavian arteries are widely patent. RIGHT CAROTID SYSTEM: No dissection, occlusion or aneurysm. There is mixed density atherosclerosis extending into the proximal ICA, resulting in 58% stenosis. LEFT CAROTID SYSTEM: No dissection, occlusion or aneurysm. Mild atherosclerotic calcification at the carotid bifurcation without hemodynamically significant stenosis. VERTEBRAL ARTERIES: Left dominant configuration. Both origins are clearly patent. There is no dissection, occlusion or flow-limiting stenosis to the skull base (V1-V3 segments). CTA HEAD FINDINGS POSTERIOR CIRCULATION: --Vertebral arteries: Normal V4 segments. --Posterior inferior cerebellar arteries (PICA): Patent origins from the vertebral arteries. --Anterior inferior cerebellar arteries (AICA): Patent origins from the basilar artery. --Basilar artery: Normal. --Superior cerebellar arteries: Normal. --Posterior cerebral arteries: Normal. Both originate from the basilar artery. Posterior communicating arteries (p-comm) are diminutive or absent. ANTERIOR CIRCULATION: --Intracranial internal carotid arteries: Normal. --Anterior cerebral arteries (ACA): Normal. Both A1 segments are present. Patent anterior communicating artery (a-comm). --Middle cerebral arteries (MCA): Normal. VENOUS SINUSES: As permitted by contrast timing, patent. ANATOMIC VARIANTS: None Review of the MIP images confirms the above findings. IMPRESSION: 1. No intracranial arterial occlusion or  hemodynamically significant stenosis. 2. There is 58% stenosis of the proximal right ICA secondary to mixed density atherosclerosis. 3. Enlarged left  thyroid lobe. Electronically Signed   By: Ulyses Jarred M.D.   On: 02/04/2020 19:52   MR BRAIN WO CONTRAST  Result Date: 02/04/2020 CLINICAL DATA:  Stroke follow-up. Central retinal artery occlusion. EXAM: MRI HEAD WITHOUT CONTRAST TECHNIQUE: Multiplanar, multiecho pulse sequences of the brain and surrounding structures were obtained without intravenous contrast. COMPARISON:  None. FINDINGS: Brain: No acute infarct, acute hemorrhage or extra-axial collection. Multifocal white matter hyperintensity, most commonly due to chronic ischemic microangiopathy. Normal volume of CSF spaces. No chronic microhemorrhage. Normal midline structures. Vascular: Normal flow voids. Skull and upper cervical spine: Normal marrow signal. Sinuses/Orbits: Negative. Other: None. IMPRESSION: 1. No acute intracranial abnormality. 2. Multifocal white matter hyperintensity, most commonly due to chronic ischemic microangiopathy. Electronically Signed   By: Ulyses Jarred M.D.   On: 02/04/2020 19:20   ECHOCARDIOGRAM COMPLETE  Result Date: 02/05/2020    ECHOCARDIOGRAM REPORT   Patient Name:   Jay Tran Date of Exam: 02/05/2020 Medical Rec #:  [id number]     Height:       70.0 in Accession #:    [id number]    Weight:       191.4 lb Date of Birth:  October 01, 1934     BSA:          2.05 m Patient Age:    24 years      BP:           165/71 mmHg Patient Gender: M             HR:           75 bpm. Exam Location:  Inpatient Procedure: 2D Echo, Cardiac Doppler and Color Doppler Indications:    TIA  History:        Patient has no prior history of Echocardiogram examinations.                 Risk Factors:Hypertension and Dyslipidemia.  Sonographer:    Dustin Flock Referring Phys: [id number] [place]  1. Left ventricular ejection fraction, by estimation, is 60 to 65%. The left ventricle has  normal function. The left ventricle has no regional Boda motion abnormalities. The left ventricular internal cavity size was mildly dilated. There is mild concentric left ventricular hypertrophy. Left ventricular diastolic parameters are indeterminate.  2. Right ventricular systolic function is mildly reduced. The right ventricular size is mildly enlarged.  3. Left atrial size was mildly dilated.  4. The mitral valve is normal in structure and function. Trivial mitral valve regurgitation. No evidence of mitral stenosis.  5. The aortic valve is tricuspid. Aortic valve regurgitation is mild. Mild to moderate aortic valve sclerosis/calcification is present, without any evidence of aortic stenosis.  6. Pulmonic valve regurgitation is moderate.  7. The inferior vena cava is normal in size with greater than 50% respiratory variability, suggesting right atrial pressure of 3 mmHg. Conclusion(s)/Recomendation(s): Normal biventricular function without evidence of hemodynamically significant valvular heart disease. FINDINGS  Left Ventricle: Left ventricular ejection fraction, by estimation, is 60 to 65%. The left ventricle has normal function. The left ventricle has no regional Lybarger motion abnormalities. The left ventricular internal cavity size was mildly dilated. There is  mild concentric left ventricular hypertrophy. Left ventricular diastolic parameters are indeterminate. Right Ventricle: The right ventricular size is mildly enlarged. No increase in right ventricular Casella thickness. Right ventricular systolic function is mildly reduced. Left Atrium: Left atrial size was mildly dilated. Right Atrium: Right atrial size was normal in size. Pericardium: There is  no evidence of pericardial effusion. Mitral Valve: The mitral valve is normal in structure and function. Trivial mitral valve regurgitation. No evidence of mitral valve stenosis. Tricuspid Valve: The tricuspid valve is normal in structure. Tricuspid valve regurgitation  is trivial. No evidence of tricuspid stenosis. Aortic Valve: The aortic valve is tricuspid. . There is mild thickening and moderate calcification of the aortic valve. Aortic valve regurgitation is mild. Mild to moderate aortic valve sclerosis/calcification is present, without any evidence of aortic stenosis. There is mild thickening of the aortic valve. There is moderate calcification of the aortic valve. Pulmonic Valve: The pulmonic valve was grossly normal. Pulmonic valve regurgitation is moderate. No evidence of pulmonic stenosis. Aorta: The aortic root, ascending aorta and aortic arch are all structurally normal, with no evidence of dilitation or obstruction. Venous: The inferior vena cava is normal in size with greater than 50% respiratory variability, suggesting right atrial pressure of 3 mmHg. IAS/Shunts: No atrial level shunt detected by color flow Doppler.  LEFT VENTRICLE PLAX 2D LVIDd:         5.30 cm  Diastology LVIDs:         3.00 cm  LV e' lateral:   6.09 cm/s LV PW:         1.30 cm  LV E/e' lateral: 10.0 LV IVS:        1.20 cm  LV e' medial:    6.53 cm/s LVOT diam:     2.30 cm  LV E/e' medial:  9.3 LV SV:         65.65 ml LV SV Index:   48.09 LVOT Area:     4.15 cm  RIGHT VENTRICLE RV Basal diam:  2.30 cm RV S prime:     10.70 cm/s TAPSE (M-mode): 1.4 cm LEFT ATRIUM             Index       RIGHT ATRIUM           Index LA diam:        4.60 cm 2.25 cm/m  RA Area:     12.40 cm LA Vol (A2C):   34.1 ml 16.64 ml/m RA Volume:   26.50 ml  12.94 ml/m LA Vol (A4C):   71.9 ml 35.10 ml/m LA Biplane Vol: 51.4 ml 25.09 ml/m  AORTIC VALVE LVOT Vmax:   86.60 cm/s LVOT Vmean:  54.100 cm/s LVOT VTI:    0.158 m  AORTA Ao Root diam: 3.40 cm MITRAL VALVE MV Area (PHT): 2.24 cm    SHUNTS MV Decel Time: 338 msec    Systemic VTI:  0.16 m MV E velocity: 60.90 cm/s  Systemic Diam: 2.30 cm MV A velocity: 71.00 cm/s MV E/A ratio:  0.86 Buford Dresser MD Electronically signed by Buford Dresser MD Signature  Date/Time: 02/05/2020/8:41:48 PM    Final    VAS US CAROTID  Result Date: 02/05/2020 Carotid Arterial Duplex Study Indications:       Right eye vision loss. Risk Factors:      Hypertension, hyperlipidemia. Comparison Study:  No prior. CT angio of the neck showed right ICA 58% on                    02/04/20. Performing Technologist: Oda Cogan RDMS, RVT  Examination Guidelines: A complete evaluation includes B-mode imaging, spectral Doppler, color Doppler, and power Doppler as needed of all accessible portions of each vessel. Bilateral testing is considered an integral part of a complete examination. Limited examinations for  reoccurring indications may be performed as noted.  Right Carotid Findings: +----------+--------+--------+--------+---------------------+------------------+           PSV cm/sEDV cm/sStenosisPlaque Description   Comments           +----------+--------+--------+--------+---------------------+------------------+ CCA Prox  120     14                                                      +----------+--------+--------+--------+---------------------+------------------+ CCA Distal83      14                                                      +----------+--------+--------+--------+---------------------+------------------+ ICA Prox  230     54      40-59%  heterogenous, focal  velocities suggest                                   and irregular        upper end of scale +----------+--------+--------+--------+---------------------+------------------+ ICA Mid   203     39      40-59%  heterogenous                            +----------+--------+--------+--------+---------------------+------------------+ ICA Distal98      19                                                      +----------+--------+--------+--------+---------------------+------------------+ ECA       164     15                                                       +----------+--------+--------+--------+---------------------+------------------+ +----------+--------+-------+----------------+-------------------+           PSV cm/sEDV cmsDescribe        Arm Pressure (mmHG) +----------+--------+-------+----------------+-------------------+ [id number]             Multiphasic, WNL                    +----------+--------+-------+----------------+-------------------+ +---------+--------+--+--------+--+---------+ VertebralPSV cm/s68EDV cm/s12Antegrade +---------+--------+--+--------+--+---------+  Left Carotid Findings: +----------+--------+--------+--------+------------------+--------+           PSV cm/sEDV cm/sStenosisPlaque DescriptionComments +----------+--------+--------+--------+------------------+--------+ CCA Prox  115     17                                         +----------+--------+--------+--------+------------------+--------+ CCA Distal88      17                                         +----------+--------+--------+--------+------------------+--------+ ICA Prox  86  20      1-39%   heterogenous               +----------+--------+--------+--------+------------------+--------+ ICA Mid   104     25                                         +----------+--------+--------+--------+------------------+--------+ ICA Distal109     24                                         +----------+--------+--------+--------+------------------+--------+ ECA       103     13                                         +----------+--------+--------+--------+------------------+--------+ +----------+--------+--------+--------+-------------------+           PSV cm/sEDV cm/sDescribeArm Pressure (mmHG) +----------+--------+--------+--------+-------------------+ [id number]                                         +----------+--------+--------+--------+-------------------+ +---------+--------+---+--------+--+ VertebralPSV  cm/s107EDV cm/s19 +---------+--------+---+--------+--+   Summary: Right Carotid: Velocities in the right ICA are consistent with a 40-59%                stenosis. Greater than 60% stenosis by peak sys velocity. Left Carotid: Velocities in the left ICA are consistent with a 1-39% stenosis. Vertebrals:  Bilateral vertebral arteries demonstrate antegrade flow. Subclavians: Normal flow hemodynamics were seen in bilateral subclavian              arteries. *See table(s) above for measurements and observations.  Electronically signed by Antony Contras MD on 02/05/2020 at 1:44:20 PM.    Final     PHYSICAL EXAM Pleasant elderly Caucasian male not in distress. . Afebrile. Head is nontraumatic. Neck is supple without bruit.    Cardiac exam no murmur or gallop. Lungs are clear to auscultation. Distal pulses are well felt. Neurological Exam ;  Awake  Alert oriented x 3. Normal speech and language.eye movements full without nystagmus.fundi were not visualized.  Right eye significantly impaired vision with only small right temporal crescentic vision present.  Normal vision in the left eye.[name] Hearing is normal. Palatal movements are normal. Face symmetric. Tongue midline. Normal strength, tone, reflexes and coordination. Normal sensation. Gait deferred.  ASSESSMENT/PLAN Mr. TAJINDER DINGLEY is a 86 y.o. male with history of lumbar stenosis, hypertension, hyperlipidemia, DVT of lower extremity presenting after waking with painless OD vision loss.   CRAO OD, likely thromboembolic from unstable R ICA stenosis    CT head No acute abnormality. Small vessel disease. Atrophy.     MRI  No acute abnormality. Small vessel disease.   CTA head no significant stenosis  CTA neck proximal R ICA 58% stenosis w/ mixed atherosclerosis   Carotid Doppler  R ICA 40-59% stenosis   2D Echo pending  LDL 128  HgbA1c 5.4  Lovenox 40 mg sq daily for VTE prophylaxis  No antithrombotic prior to admission, now on aspirin 324 mg  daily. Change to aspirin 81 and plavix 75 my daily. Continue DAPT x 3 weeks then aspirin alone    Therapy  recommendations:  HH PT and likely HH OT  Disposition:  Return home  [place] for d/c today  Symptomatic R Carotid Stenosis  CTA neck proximal R ICA 58% stenosis w/ mixed atherosclerosis   Carotid Doppler  R ICA 40-59% stenosis   Recommend VVS consult - Kaliya Shreiner contacted Dr. Trula Slade  Hypertension  Stabl but elevated . BP goal normotensive  Hyperlipidemia  Home meds:  No statin  Now on lipitor 40  LDL 128, goal < 70  Continue statin at discharge  Other Stroke Risk Factors  Advanced age  Former Cigarette smoker, quit 56 yrs ago  for requested labs within last 26280 hours., Cocaine No results found for requested labs within last 26280 hours.. Patient advised to stop using due to stroke risk.  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  Recommend aspirin and Plavix for 3 months and   emergent right carotid revascularization.planned by  Dr. Trula Slade vascular surgeon tomorrow  Discussed with Dr. Pietro Cassis    Greater than 50% time during this 25-minute visit was spent on counseling and coordination of care and discussion about stroke prevention treatment and answering questions.  Antony Contras, MD Medical Director [facility] Pager: [phone] 02/06/2020 1:41 PM   To contact Stroke Continuity provider, please refer to [email or web address] After hours, contact General Neurology

## 2020-02-06 NOTE — Progress Notes (Signed)
Physical Therapy Treatment Patient Details Name: Jay Tran MRN: [id number] DOB: 03-Jan-1934 Today's Date: 02/06/2020    History of Present Illness Pt is an 86 y.o. male admitted 02/04/20 with R eye vision loss. MRI brain negative for CVA. CTA neck and carotid duplex shows R ICA stenosis. Per vascular sx, may benefit from revascularization R ICA. PMH includes HTN, HLD, macular degeneration, chronic back pain (multiple lumbar sxs); of note, pt with bulging lumbar disc, to receive 3rd spinal injection 2/25.   PT Comments    Pt progressing well with mobility. Able to increase ambulation distance and work on higher level balance tasks at supervision-level. Pt with continued R-side vision deficits, able to see out of periphery. Motivated to participate and return home.   Follow Up Recommendations  Home health PT;Supervision for mobility/OOB     Equipment Recommendations  None recommended by PT    Recommendations for Other Services       Precautions / Restrictions Precautions Precautions: Fall;Other (comment) Precaution Comments: R eye impaired vision (can see out of periphery) Restrictions Weight Bearing Restrictions: No    Mobility  Bed Mobility Overal bed mobility: Modified Independent                Transfers Overall transfer level: Needs assistance Equipment used: None Transfers: Sit to/from Stand Sit to Stand: Supervision         General transfer comment: Pt pointing for PT to grab RW, encouraged no DME during PT treatments  Ambulation/Gait Ambulation/Gait assistance: Min guard;Supervision Gait Distance (Feet): 800 Feet Assistive device: None Gait Pattern/deviations: Step-through pattern;Decreased stride length;Antalgic   Gait velocity interpretation: 1.31 - 2.62 ft/sec, indicative of limited community ambulator General Gait Details: Slow, steady gait with initial min guard, progressing to supervision; no overt LOB or instability with higher level balance  tasks. Pt with c/o chronic R lower back/LE pain, but able to compensate well   Stairs             Wheelchair Mobility    Modified Rankin (Stroke Patients Only)       Balance Overall balance assessment: Needs assistance Sitting-balance support: No upper extremity supported;Feet supported Sitting balance-Leahy Scale: Good       Standing balance-Leahy Scale: Good               High level balance activites: Side stepping;Backward walking;Direction changes;Turns High Level Balance Comments: Did not perform complete DGI - no overt instability or LOB noted with direction changes, turning, head turns, gait speed changes (although pt stating, "I don't want to try walking too fast because that will mess up my balance"), or stepping over objects            Cognition Arousal/Alertness: Awake/alert Behavior During Therapy: WFL for tasks assessed/performed Overall Cognitive Status: Within Functional Limits for tasks assessed                                 General Comments: most likely baseline; wife manages medicine; pt still driving      Exercises      General Comments General comments (skin integrity, edema, etc.): Discussed use of RW upon return home for added stability and decreased fall risk, pt in agreement      Pertinent Vitals/Pain Pain Assessment: Faces Faces Pain Scale: Hurts a little bit Pain Location: R-side lower back into buttocks Pain Descriptors / Indicators: Constant;Guarding Pain Intervention(s): Monitored during session    Home Living  Prior Function            PT Goals (current goals can now be found in the care plan section) Progress towards PT goals: Progressing toward goals    Frequency    Min 3X/week      PT Plan Current plan remains appropriate    Co-evaluation              AM-PAC PT "6 Clicks" Mobility   Outcome Measure  Help needed turning from your back to your side  while in a flat bed without using bedrails?: None Help needed moving from lying on your back to sitting on the side of a flat bed without using bedrails?: None Help needed moving to and from a bed to a chair (including a wheelchair)?: None Help needed standing up from a chair using your arms (e.g., wheelchair or bedside chair)?: None Help needed to walk in hospital room?: None Help needed climbing 3-5 steps with a railing? : A Little 6 Click Score: 23    End of Session Equipment Utilized During Treatment: Gait belt Activity Tolerance: Patient tolerated treatment well Patient left: in bed;with call bell/phone within reach;with bed alarm set Nurse Communication: Mobility status PT Visit Diagnosis: Unsteadiness on feet (R26.81);Difficulty in walking, not elsewhere classified (R26.2);History of falling (Z91.81)     Time: [id number] PT Time Calculation (min) (ACUTE ONLY): 17 min  Charges:  $Gait Training: 8-22 mins                    Mabeline Caras, PT, DPT Acute Rehabilitation Services  Pager [phone] Office [place] 02/06/2020, 1:35 PM

## 2020-02-07 ENCOUNTER — Encounter (HOSPITAL_COMMUNITY): Payer: Self-pay | Admitting: Family Medicine

## 2020-02-07 ENCOUNTER — Inpatient Hospital Stay (HOSPITAL_COMMUNITY): Payer: Medicare Other | Admitting: Certified Registered Nurse Anesthetist

## 2020-02-07 ENCOUNTER — Encounter (HOSPITAL_COMMUNITY)
Admission: EM | Disposition: A | Discharge status: Home Health | Payer: Self-pay | Source: Home / Self Care | Attending: Internal Medicine

## 2020-02-07 HISTORY — PX: PATCH ANGIOPLASTY: SHX6230

## 2020-02-07 HISTORY — PX: ENDARTERECTOMY: SHX5162

## 2020-02-07 LAB — TYPE AND SCREEN
ABO/RH(D): A POS
Antibody Screen: NEGATIVE

## 2020-02-07 LAB — POCT ACTIVATED CLOTTING TIME: Activated Clotting Time: 252 seconds

## 2020-02-07 SURGERY — ENDARTERECTOMY, CAROTID
Anesthesia: General | Site: Neck | Laterality: Right

## 2020-02-07 MED ORDER — HEPARIN SODIUM (PORCINE) 1000 UNIT/ML IJ SOLN
INTRAMUSCULAR | Status: DC | PRN
  Administered 2020-02-07: 9000 [IU] via INTRAVENOUS

## 2020-02-07 MED ORDER — MORPHINE SULFATE (PF) 2 MG/ML IV SOLN: 2.0000 mg | INTRAVENOUS | Status: DC | PRN

## 2020-02-07 MED ORDER — SUGAMMADEX SODIUM 200 MG/2ML IV SOLN
INTRAVENOUS | Status: DC | PRN
  Administered 2020-02-07: 200 mg via INTRAVENOUS

## 2020-02-07 MED ORDER — MAGNESIUM SULFATE 2 GM/50ML IV SOLN: 2.0000 g | Freq: Every day | INTRAVENOUS | Status: DC | PRN

## 2020-02-07 MED ORDER — FENTANYL CITRATE (PF) 250 MCG/5ML IJ SOLN
INTRAMUSCULAR | Status: DC | PRN
  Administered 2020-02-07 (×2): 25 ug via INTRAVENOUS
  Administered 2020-02-07: 100 ug via INTRAVENOUS

## 2020-02-07 MED ORDER — LIDOCAINE HCL (PF) 1 % IJ SOLN
INTRAMUSCULAR | Status: AC
  Filled 2020-02-07: qty 30

## 2020-02-07 MED ORDER — HYDROMORPHONE HCL 1 MG/ML IJ SOLN
INTRAMUSCULAR | Status: AC
  Administered 2020-02-07: 0.5 mg
  Filled 2020-02-07: qty 1

## 2020-02-07 MED ORDER — POLYETHYLENE GLYCOL 3350 17 G PO PACK: 17.0000 g | PACK | Freq: Every day | ORAL | Status: DC | PRN

## 2020-02-07 MED ORDER — POTASSIUM CHLORIDE CRYS ER 20 MEQ PO TBCR: 20.0000 meq | EXTENDED_RELEASE_TABLET | Freq: Every day | ORAL | Status: DC | PRN

## 2020-02-07 MED ORDER — SODIUM CHLORIDE 0.9 % IV SOLN
INTRAVENOUS | Status: AC
  Filled 2020-02-07: qty 1.2

## 2020-02-07 MED ORDER — PROTAMINE SULFATE 10 MG/ML IV SOLN
INTRAVENOUS | Status: DC | PRN
  Administered 2020-02-07: 50 mg via INTRAVENOUS

## 2020-02-07 MED ORDER — LIDOCAINE 2% (20 MG/ML) 5 ML SYRINGE
INTRAMUSCULAR | Status: DC | PRN
  Administered 2020-02-07: 80 mg via INTRAVENOUS

## 2020-02-07 MED ORDER — CLEVIDIPINE BUTYRATE 0.5 MG/ML IV EMUL
INTRAVENOUS | Status: AC
  Filled 2020-02-07: qty 50

## 2020-02-07 MED ORDER — HEMOSTATIC AGENTS (NO CHARGE) OPTIME
TOPICAL | Status: DC | PRN
  Administered 2020-02-07: 1 via TOPICAL

## 2020-02-07 MED ORDER — FENTANYL CITRATE (PF) 250 MCG/5ML IJ SOLN
INTRAMUSCULAR | Status: AC
  Filled 2020-02-07: qty 5

## 2020-02-07 MED ORDER — PROPOFOL 10 MG/ML IV BOLUS
INTRAVENOUS | Status: DC | PRN
  Administered 2020-02-07: 130 mg via INTRAVENOUS

## 2020-02-07 MED ORDER — HYDRALAZINE HCL 20 MG/ML IJ SOLN: 5.0000 mg | INTRAMUSCULAR | Status: DC | PRN

## 2020-02-07 MED ORDER — DOCUSATE SODIUM 100 MG PO CAPS: 100.0000 mg | ORAL_CAPSULE | Freq: Every day | ORAL | Status: DC

## 2020-02-07 MED ORDER — PHENOL 1.4 % MT LIQD: 1.0000 | OROMUCOSAL | Status: DC | PRN

## 2020-02-07 MED ORDER — GUAIFENESIN-DM 100-10 MG/5ML PO SYRP: 15.0000 mL | ORAL_SOLUTION | ORAL | Status: DC | PRN

## 2020-02-07 MED ORDER — ALUM & MAG HYDROXIDE-SIMETH 200-200-20 MG/5ML PO SUSP
15.0000 mL | ORAL | Status: DC | PRN
  Filled 2020-02-07: qty 30

## 2020-02-07 MED ORDER — HYDRALAZINE HCL 20 MG/ML IJ SOLN
10.0000 mg | Freq: Four times a day (QID) | INTRAMUSCULAR | Status: DC | PRN
  Filled 2020-02-07 (×2): qty 0.5

## 2020-02-07 MED ORDER — ONDANSETRON HCL 4 MG/2ML IJ SOLN
4.0000 mg | Freq: Four times a day (QID) | INTRAMUSCULAR | Status: DC | PRN
  Administered 2020-02-07 – 2020-02-08 (×2): 4 mg via INTRAVENOUS
  Filled 2020-02-07 (×2): qty 2

## 2020-02-07 MED ORDER — DEXAMETHASONE SODIUM PHOSPHATE 10 MG/ML IJ SOLN
INTRAMUSCULAR | Status: DC | PRN
  Administered 2020-02-07: 5 mg via INTRAVENOUS

## 2020-02-07 MED ORDER — HYDRALAZINE HCL 20 MG/ML IJ SOLN
INTRAMUSCULAR | Status: AC
  Administered 2020-02-07: 10 mg via INTRAVENOUS
  Filled 2020-02-07: qty 1

## 2020-02-07 MED ORDER — OXYCODONE-ACETAMINOPHEN 5-325 MG PO TABS
1.0000 | ORAL_TABLET | ORAL | Status: DC | PRN
  Filled 2020-02-07: qty 1

## 2020-02-07 MED ORDER — LACTATED RINGERS IV SOLN: INTRAVENOUS | Status: DC | PRN

## 2020-02-07 MED ORDER — PHENYLEPHRINE HCL-NACL 10-0.9 MG/250ML-% IV SOLN
INTRAVENOUS | Status: DC | PRN
  Administered 2020-02-07: 75 ug/min via INTRAVENOUS
  Administered 2020-02-07: 50 ug/min via INTRAVENOUS

## 2020-02-07 MED ORDER — METOPROLOL TARTRATE 5 MG/5ML IV SOLN: 2.0000 mg | INTRAVENOUS | Status: DC | PRN

## 2020-02-07 MED ORDER — BISACODYL 10 MG RE SUPP: 10.0000 mg | Freq: Every day | RECTAL | Status: DC | PRN

## 2020-02-07 MED ORDER — SODIUM CHLORIDE 0.9 % IV SOLN
INTRAVENOUS | Status: DC | PRN
  Administered 2020-02-07: 500 mL

## 2020-02-07 MED ORDER — CEFAZOLIN SODIUM-DEXTROSE 2-4 GM/100ML-% IV SOLN
2.0000 g | Freq: Three times a day (TID) | INTRAVENOUS | Status: AC
  Administered 2020-02-07 – 2020-02-08 (×2): 2 g via INTRAVENOUS
  Filled 2020-02-07 (×2): qty 100

## 2020-02-07 MED ORDER — SODIUM CHLORIDE 0.9 % IV SOLN
0.0125 ug/kg/min | INTRAVENOUS | Status: AC
  Administered 2020-02-07: .1 ug/kg/min via INTRAVENOUS
  Filled 2020-02-07: qty 2000

## 2020-02-07 MED ORDER — ROCURONIUM BROMIDE 100 MG/10ML IV SOLN
INTRAVENOUS | Status: DC | PRN
  Administered 2020-02-07: 90 mg via INTRAVENOUS

## 2020-02-07 MED ORDER — SODIUM CHLORIDE 0.9 % IV SOLN: INTRAVENOUS | Status: DC

## 2020-02-07 MED ORDER — ONDANSETRON HCL 4 MG/2ML IJ SOLN
INTRAMUSCULAR | Status: DC | PRN
  Administered 2020-02-07: 4 mg via INTRAVENOUS

## 2020-02-07 MED ORDER — 0.9 % SODIUM CHLORIDE (POUR BTL) OPTIME
TOPICAL | Status: DC | PRN
  Administered 2020-02-07: 3000 mL

## 2020-02-07 MED ORDER — LABETALOL HCL 5 MG/ML IV SOLN
10.0000 mg | INTRAVENOUS | Status: DC | PRN
  Filled 2020-02-07: qty 4

## 2020-02-07 MED ORDER — ESMOLOL HCL 100 MG/10ML IV SOLN
INTRAVENOUS | Status: DC | PRN
  Administered 2020-02-07: 20 mg via INTRAVENOUS

## 2020-02-07 MED ORDER — EPHEDRINE SULFATE-NACL 50-0.9 MG/10ML-% IV SOSY
PREFILLED_SYRINGE | INTRAVENOUS | Status: DC | PRN
  Administered 2020-02-07 (×2): 5 mg via INTRAVENOUS

## 2020-02-07 MED ORDER — PHENYLEPHRINE 40 MCG/ML (10ML) SYRINGE FOR IV PUSH (FOR BLOOD PRESSURE SUPPORT)
PREFILLED_SYRINGE | INTRAVENOUS | Status: DC | PRN
  Administered 2020-02-07: 40 ug via INTRAVENOUS
  Administered 2020-02-07: 80 ug via INTRAVENOUS

## 2020-02-07 MED ORDER — SODIUM CHLORIDE 0.9 % IV SOLN: 500.0000 mL | Freq: Once | INTRAVENOUS | Status: DC | PRN

## 2020-02-07 MED ORDER — HEPARIN SODIUM (PORCINE) 1000 UNIT/ML IJ SOLN
INTRAMUSCULAR | Status: AC
  Filled 2020-02-07: qty 1

## 2020-02-07 SURGICAL SUPPLY — 52 items
CANISTER SUCT 3000ML PPV (MISCELLANEOUS) ×3 IMPLANT
CATH ROBINSON RED A/P 18FR (CATHETERS) ×3 IMPLANT
CATH SUCT 10FR WHISTLE TIP (CATHETERS) ×3 IMPLANT
CLIP VESOCCLUDE MED 6/CT (CLIP) ×3 IMPLANT
CLIP VESOCCLUDE SM WIDE 6/CT (CLIP) ×3 IMPLANT
COVER PROBE W GEL 5X96 (DRAPES) ×2 IMPLANT
COVER WAND RF STERILE (DRAPES) ×1 IMPLANT
DERMABOND ADVANCED (GAUZE/BANDAGES/DRESSINGS) ×2
DERMABOND ADVANCED .7 DNX12 (GAUZE/BANDAGES/DRESSINGS) ×1 IMPLANT
DRAIN CHANNEL 15F RND FF W/TCR (WOUND CARE) IMPLANT
ELECT REM PT RETURN 9FT ADLT (ELECTROSURGICAL) ×3
ELECTRODE REM PT RTRN 9FT ADLT (ELECTROSURGICAL) ×1 IMPLANT
EVACUATOR SILICONE 100CC (DRAIN) IMPLANT
GLOVE BIO SURGEON STRL SZ 6.5 (GLOVE) ×4 IMPLANT
GLOVE BIO SURGEONS STRL SZ 6.5 (GLOVE) ×4
GLOVE BIOGEL PI IND STRL 6.5 (GLOVE) IMPLANT
GLOVE BIOGEL PI IND STRL 7.5 (GLOVE) ×1 IMPLANT
GLOVE BIOGEL PI INDICATOR 6.5 (GLOVE) ×8
GLOVE BIOGEL PI INDICATOR 7.5 (GLOVE) ×2
GLOVE ECLIPSE 6.5 STRL STRAW (GLOVE) ×2 IMPLANT
GLOVE SURG SS PI 7.5 STRL IVOR (GLOVE) ×3 IMPLANT
GOWN STRL REUS W/ TWL LRG LVL3 (GOWN DISPOSABLE) ×2 IMPLANT
GOWN STRL REUS W/ TWL XL LVL3 (GOWN DISPOSABLE) ×1 IMPLANT
GOWN STRL REUS W/TWL LRG LVL3 (GOWN DISPOSABLE) ×10
GOWN STRL REUS W/TWL XL LVL3 (GOWN DISPOSABLE) ×2
HEMOSTAT SNOW SURGICEL 2X4 (HEMOSTASIS) ×2 IMPLANT
INSERT FOGARTY SM (MISCELLANEOUS) IMPLANT
KIT BASIN OR (CUSTOM PROCEDURE TRAY) ×3 IMPLANT
KIT SHUNT ARGYLE CAROTID ART 6 (VASCULAR PRODUCTS) IMPLANT
KIT TURNOVER KIT B (KITS) ×3 IMPLANT
NDL HYPO 25GX1X1/2 BEV (NEEDLE) IMPLANT
NEEDLE HYPO 25GX1X1/2 BEV (NEEDLE) IMPLANT
NS IRRIG 1000ML POUR BTL (IV SOLUTION) ×9 IMPLANT
PACK CAROTID (CUSTOM PROCEDURE TRAY) ×3 IMPLANT
PAD ARMBOARD 7.5X6 YLW CONV (MISCELLANEOUS) ×6 IMPLANT
PATCH VASC XENOSURE 1CMX6CM (Vascular Products) ×2 IMPLANT
PATCH VASC XENOSURE 1X6 (Vascular Products) IMPLANT
POSITIONER HEAD DONUT 9IN (MISCELLANEOUS) ×3 IMPLANT
SET WALTER ACTIVATION W/DRAPE (SET/KITS/TRAYS/PACK) ×2 IMPLANT
SHUNT CAROTID BYPASS 10 (VASCULAR PRODUCTS) IMPLANT
SHUNT CAROTID BYPASS 12FRX15.5 (VASCULAR PRODUCTS) IMPLANT
SUT ETHILON 3 0 PS 1 (SUTURE) IMPLANT
SUT PROLENE 6 0 BV (SUTURE) ×8 IMPLANT
SUT PROLENE 7 0 BV 1 (SUTURE) ×2 IMPLANT
SUT SILK 3 0 (SUTURE)
SUT SILK 3-0 18XBRD TIE 12 (SUTURE) IMPLANT
SUT VIC AB 3-0 SH 27 (SUTURE) ×4
SUT VIC AB 3-0 SH 27X BRD (SUTURE) ×2 IMPLANT
SUT VIC AB 3-0 X1 27 (SUTURE) ×3 IMPLANT
SYR CONTROL 10ML LL (SYRINGE) IMPLANT
TOWEL GREEN STERILE (TOWEL DISPOSABLE) ×3 IMPLANT
WATER STERILE IRR 1000ML POUR (IV SOLUTION) ×3 IMPLANT

## 2020-02-07 NOTE — Progress Notes (Signed)
PROGRESS NOTE  Jay Tran  DOB: 03-21-1934  PCP: Leonard Downing, MD [id number]  DOA: 02/04/2020 Admitted From: Home  LOS: 3 days   No chief complaint on file.  Brief narrative: Patient is an 86 y.o. male with a history of lumbar stenosis, hypertension, hyperlipidemia, DVT of lower extremity. Patient was sent to the ED by his Ophthalmologist for stroke work-up after found to have a right sided CRAO (central retinal artery occlusion).   2/15, patient went to bed in his usual state of health.  Next morning he woke up completely blind in his right eye. He immediately called his Ophthalmologist, got evaluated in the office and was found to have central retinal artery occlusion.  Patient was immediately sent to the hospital to be evaluated for stroke.   In the ED, patient was afebrile, intermittently hypertensive up to systolic of [id number] on room air. CBC and BMP otherwise unremarkable.   CT head negative.   MRI brain negative. CTA head and neck showed 58% stenosis of the proximal right ICA.  Patient was admitted under hospitalist service for further stroke work-up. Neurology and vascular surgery consult appreciated. Patient is planned for right CEA 2/19..  Subjective: Patient was seen and examined in the morning.  Patient was waiting for the OR today.  Not in distress.  Right eye vision improved on the temporal field but not since then.    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 -Found to have CRAO by ophthalmologist. -Admitted for stroke work-up.   -CT head negative.   -MRI brain negative. -echocardiogram normal. -CTA head and neck showed 58% stenosis of the proximal right ICA. -Has been started on aspirin, Plavix and statin.   -Hemoglobin A1c 5.4, LDL low at 29, LDL elevated to 128. -PT/OT/ST eval obtained.  Home with home implant. -Continue frequent neuro checks and keep on telemetry -Only partially improving vision on  the right eye.  Proximal right ICA stenosis -58% stenosis -Vascular surgery consult appreciated.  -Right CEA planned for today.  HTN -HCTZ and lisinopril remain on hold perioperatively. -Blood pressure seems to be trending up, 176/78 noted this morning. -Ordered for hydralazine as needed.  BPH  Continue Proscar and Flomax   GERD Continue PPI  Chronic back pain Continue PRN Tramadol and Tizanidine  DVT prophylaxis:  Lovenox subcu Antimicrobials:  None Fluid: None Diet: Cardiac diet  Code Status:  Full code Mobility: PT/OT eval obtained Family Communication:  Patient's wife has been updated Discharge plan:  Anticipated date: Depends on postsurgical improvement Disposition: Likely home postsurgically Barriers: Pending right CVA today on 2/19.  Consultants:  Neurology  Vascular surgery  Procedures:  None  Antimicrobials: Anti-infectives (From admission, onward)   Start     Dose/Rate Route Frequency Ordered Stop   02/07/20 1000  ceFAZolin (ANCEF) IVPB 2g/100 mL premix     2 g 200 mL/hr over 30 Minutes Intravenous Every 8 hours 02/06/20 1117 02/08/20 0159        Code Status: Full Code   Diet Order            Diet NPO time specified  Diet effective midnight        Diet - low sodium heart healthy              Infusions:  .  ceFAZolin (ANCEF) IV      Scheduled Meds: . [MAR Hold]  stroke: mapping our early stages of recovery book   Does not apply Once  . [  MAR Hold] aspirin  81 mg Oral Daily  . [MAR Hold] atorvastatin  40 mg Oral q1800  . [MAR Hold] clopidogrel  75 mg Oral Daily  . [MAR Hold] enoxaparin (LOVENOX) injection  40 mg Subcutaneous Q24H  . [MAR Hold] finasteride  5 mg Oral QHS  . [MAR Hold] pantoprazole  40 mg Oral Daily  . [MAR Hold] tamsulosin  0.4 mg Oral QHS    PRN meds: [MAR Hold] acetaminophen **OR** [MAR Hold] acetaminophen (TYLENOL) oral liquid 160 mg/5 mL **OR** [MAR Hold] acetaminophen, [MAR Hold] labetalol, [MAR Hold]  tiZANidine, [MAR Hold] traMADol   Objective: Vitals:   02/07/20 0418 02/07/20 0919  BP: (!) 146/77 (!) 176/78  Pulse: 64 (!) 59  Resp: 16 20  Temp: 97.9 F (36.6 C) 98 F (36.7 C)  SpO2: 92% 95%   No intake or output data in the 24 hours ending 02/07/20 1018 Filed Weights   02/05/20 1030  Weight: 86.8 kg   Weight change:  Body mass index is 27.46 kg/m.   Physical Exam: General exam: Appears calm and comfortable.  Skin: No rashes, lesions or ulcers. HEENT: Atraumatic, normocephalic, supple neck, no obvious bleeding Lungs: Clear to auscultation bilaterally, no wheezing, no crackles CVS: Regular rate and rhythm, no murmur GI/Abd soft, nontender, nondistended, bowel sound present CNS: Alert, awake, oriented x3.  Partially improving vision on the right temporal field Psychiatry: Mood appropriate Extremities: No pedal edema, no calf tenderness  Data Review: I have personally reviewed the laboratory data and studies available.  Recent Labs  Lab 02/04/20 1227 02/04/20 1326  WBC 7.2  --   NEUTROABS 5.1  --   HGB 15.0 14.6  HCT 43.3 43.0  MCV 94.5  --   PLT 320  --    Recent Labs  Lab 02/04/20 1227 02/04/20 1326  NA 134* 132*  K 3.6 3.4*  CL 98 95*  CO2 24  --   GLUCOSE 105* 99  BUN 12 16  CREATININE 0.95 0.90  CALCIUM 9.0  --    Terrilee Croak, MD  [facility] 02/04/2020

## 2020-02-07 NOTE — Progress Notes (Signed)
OT Cancellation Note  Patient Details Name: Jay Tran MRN: [id number] DOB: [birth year]   Cancelled Treatment:    Reason Eval/Treat Not Completed: Patient at procedure or test/ unavailable(Will return as schedule allows.)  [place], OTR/L Acute Rehab Pager: [phone] Office: [phone] 02/07/2020, 4:13 PM

## 2020-02-07 NOTE — Progress Notes (Addendum)
  Day of Surgery Note    Subjective: Patient evaluated in PACU.  He is resting comfortably but complaining of incisional pain.   Vitals:   02/07/20 1510 02/07/20 1540  BP: (!) 142/69 138/63  Pulse: 77 74  Resp: 13 13  Temp:    SpO2: 95% 95%    Incisions:   Right neck incision with mild edema and ecchymosis.  There is no active bleeding in the tissues are soft Extremities: 5 out of 5 bilateral grip strength.  Normal active range of motion both lower extremities Cardiac: Rate rhythm regular Lungs: Nonlabored Neuro: The and oriented x4.  Tongue is midline.  Extremities as outlined above   Assessment/Plan:  This is a 86 y.o. male who is s/p right carotid endarterectomy.  Preoperatively, he had a right visual field deficit.  He is otherwise neurologically intact.  He is hemodynamically stable.   -Risa Grill, PA-C 02/07/2020 4:20 PM [phone]   I agree with the above.  I have seen and evaluated the patient in the PACU.  He is status post right carotid endarterectomy for presumed retinal artery occlusion.  He is neurologically intact.  There is a small surface hematoma in the right neck.  Otherwise he is doing very well and is stable for transfer to the floor.  From my perspective he does not need to be discharged home on Plavix so that he can get a back injection next week.  He will require aspirin therapy as well as statin therapy.  Annamarie Major

## 2020-02-07 NOTE — Progress Notes (Signed)
PT Cancellation Note  Patient Details Name: Jay Tran MRN: [id number] DOB: [birth year]   Cancelled Treatment:    Reason Eval/Treat Not Completed: Patient at procedure or test/unavailable. Pt currently off unit for carotid endarterectomy. Will check back as schedule allows to continue with PT POC.   Thelma Comp 02/07/2020, 10:23 AM   Rolinda Roan, PT, DPT Acute Rehabilitation Services Pager: [phone] Office: [phone]

## 2020-02-07 NOTE — Op Note (Signed)
Patient name: Jay Tran MRN: [id number] DOB: 09/20/1934 Sex: male  02/07/2020 Pre-operative Diagnosis: symptomatic   right carotid stenosis Post-operative diagnosis:  Same Surgeon:  Annamarie Major Assistants:  Risa Grill Procedure:    right carotid Endarterectomy with bovine pericardial patch angioplasty Anesthesia:  General Blood Loss: 100 cc Specimens: None  Findings: 70%stenosis; Thrombus: None  Indications: This is a 86 year old gentleman who presented with likely retinal artery occlusion and visual disturbances which have slowly been improving.  CT angiogram revealed a 70% right carotid stenosis which was felt to be the etiology of his neurologic event.  We discussed proceeding with endarterectomy for stroke prevention.  Procedure:  The patient was identified in the holding area and taken to [place] [facility]  The patient was then placed supine on the table.   General endotrachial anesthesia was administered.  The patient was prepped and draped in the usual sterile fashion.  A time out was called and antibiotics were administered.  The incision was made along the anterior border of the right sternocleidomastoid muscle.  Cautery was used to dissect through the subcutaneous tissue.  The platysma muscle was divided with cautery.  The internal jugular vein was exposed along its anterior medial border.  The common facial vein was exposed and then divided between 2-0 silk ties and metal clips.  The common carotid artery was then circumferentially exposed and encircled with an umbilical tape.  The vagus nerve was identified and protected.  Next sharp dissection was used to expose the external carotid artery and the superior thyroid artery.  The were encircled with a blue vessel loop and a 2-0 silk tie respectively.  Finally, the internal carotid was carefully dissected free.  An umbilical tape was placed around the internal carotid artery distal to the diseased segment.  The hypoglossal nerve was  visualized throughout and protected.  The patient was given systemic heparinization.  A bovine carotid patch was selected and prepared on the back table.  A 10 french shunt was also prepared.  After blood pressure readings were appropriate and the heparin had been given time to circulate, the internal carotid artery was occluded with a baby Gregory clamp.  The external and common carotid arteries were then occluded with vascular clamps and the 2-0 tie tightened on the superior thyroid artery.  A #11 blade was used to make an arteriotomy in the common carotid artery.  This was extended with Potts scissors along the anterior and lateral border of the common and internal carotid artery.  Approximately 70% stenosis was identified.  There was no thrombus identified.  The 10 french shunt was not placed, as there was excellent backbleeding.  A kleiner kuntz elevator was used to perform endarterectomy.  An eversion endarterectomy was performed in the external carotid artery.  A good distal endpoint was obtained in the internal carotid artery.  The specimen was removed and sent to pathology.  Heparinized saline was used to irrigate the endarterectomized field.  All potential embolic debris was removed.  Bovine pericardial patch angioplasty was then performed using a running 6-0 Prolene. The common internal and external carotid arteries were all appropriately flushed. The artery was again irrigated with heparin saline.  The anastomosis was then secured. The clamp was first released on the external carotid artery followed by the common carotid artery approximately 30 seconds later, bloodflow was reestablish through the internal carotid artery.  Next, a hand-held  Doppler was used to evaluate the signals in the common, external, and  internal  carotid arteries, all of which had appropriate signals. I then administered  50 mg protamine. The wound was then irrigated.  After hemostasis was achieved, the carotid sheath was  reapproximated with 3-0 Vicryl. The  platysma muscle was reapproximated with running 3-0 Vicryl. The skin  was closed with 4-0 Vicryl. Dermabond was placed on the skin. The  patient was then successfully extubated. His neurologic exam was  similar to his preprocedural exam. The patient was then taken to recovery room  in stable condition. There were no complications.     Disposition:  To PACU in stable condition.  Relevant Operative Details: Approximately 70% stenosis at the bifurcation with an associated ulcer.  No shunt was utilized as there was excellent backbleeding.  The external carotid was externally rotated.  A bovine pericardial patch was utilized.  Theotis Burrow, M.D., [facility] [place] Office: [phone] Pager:  [phone]

## 2020-02-07 NOTE — Anesthesia Preprocedure Evaluation (Signed)
Anesthesia Evaluation  Patient identified by MRN, date of birth, ID band Patient awake    Reviewed: Allergy & Precautions, NPO status , Patient's Chart, lab work & pertinent test results  History of Anesthesia Complications (+) PONV  Airway Mallampati: II  TM Distance: >3 FB Neck ROM: Full    Dental no notable dental hx.    Pulmonary former smoker,    Pulmonary exam normal breath sounds clear to auscultation       Cardiovascular hypertension, + Peripheral Vascular Disease  Normal cardiovascular exam Rhythm:Regular Rate:Normal     Neuro/Psych    GI/Hepatic Neg liver ROS, GERD  ,  Endo/Other  negative endocrine ROS  Renal/GU negative Renal ROS     Musculoskeletal  (+) Arthritis ,   Abdominal   Peds  Hematology   Anesthesia Other Findings Carotid stenosis  Reproductive/Obstetrics                             Anesthesia Physical  Anesthesia Plan  ASA: III  Anesthesia Plan: General   Post-op Pain Management:    Induction: Intravenous  PONV Risk Score and Plan: 3 and Dexamethasone, Midazolam, Ondansetron and Treatment may vary due to age or medical condition  Airway Management Planned: Oral ETT  Additional Equipment: Arterial line  Intra-op Plan:   Post-operative Plan: Extubation in OR  Informed Consent: I have reviewed the patients History and Physical, chart, labs and discussed the procedure including the risks, benefits and alternatives for the proposed anesthesia with the patient or authorized representative who has indicated his/her understanding and acceptance.     Dental advisory given  Plan Discussed with: CRNA and Anesthesiologist  Anesthesia Plan Comments:         Anesthesia Quick Evaluation

## 2020-02-07 NOTE — Transfer of Care (Signed)
Immediate Anesthesia Transfer of Care Note  Patient: Jay Tran  Procedure(s) Performed: ENDARTERECTOMY CAROTID (Right Neck) PATCH ANGIOPLASTY USING XENOSURE BIOLOGIC PATCH (Right Neck)  Patient Location: PACU  Anesthesia Type:General  Level of Consciousness: drowsy  Airway & Oxygen Therapy: Patient Spontanous Breathing and Patient connected to face mask oxygen  Post-op Assessment: Report given to RN and Post -op Vital signs reviewed and stable  Post vital signs: Reviewed  Last Vitals:  Vitals Value Taken Time  BP 185/77 02/07/20 1355  Temp    Pulse 69 02/07/20 1409  Resp 19 02/07/20 1409  SpO2 96 % 02/07/20 1409  Vitals shown include unvalidated device data.  Last Pain:  Vitals:   02/07/20 0919  TempSrc: Oral  PainSc:       Patients Stated Pain Goal: 0 ([id number])  Complications: No apparent anesthesia complications

## 2020-02-07 NOTE — Progress Notes (Signed)
STROKE TEAM PROGRESS NOTE   INTERVAL HISTORY Patient had right carotid endarterectomy done earlier today by Dr. Trula Slade.  Procedure went well.  Is lying comfortably in bed.  He has slight swelling at the surgery site but is awake interactive and has no focal deficits  Vitals:   02/07/20 1510 02/07/20 1540 02/07/20 1610 02/07/20 1647  BP: (!) 142/69 138/63 (!) 152/64 (!) 166/78  Pulse: 77 74 73 79  Resp: 13 13 13 20   Temp:    (!) 97.5 F (36.4 C)  TempSrc:    Oral  SpO2: 95% 95% 96% 94%  Weight:      Height:        CBC:  Recent Labs  Lab 02/04/20 1227 02/04/20 1326  WBC 7.2  --   NEUTROABS 5.1  --   HGB 15.0 14.6  HCT 43.3 43.0  MCV 94.5  --   PLT 320  --     Basic Metabolic Panel:  Recent Labs  Lab 02/04/20 1227 02/04/20 1326  NA 134* 132*  K 3.6 3.4*  CL 98 95*  CO2 24  --   GLUCOSE 105* 99  BUN 12 16  CREATININE 0.95 0.90  CALCIUM 9.0  --    Lipid Panel:     Component Value Date/Time   CHOL 213 (H) 02/04/2020 2305   TRIG 280 (H) 02/04/2020 2305   HDL 29 (L) 02/04/2020 2305   CHOLHDL 7.3 02/04/2020 2305   VLDL 56 (H) 02/04/2020 2305   LDLCALC 128 (H) 02/04/2020 2305   HgbA1c:  Lab Results  Component Value Date   HGBA1C 5.4 02/04/2020   Urine Drug Screen: No results found for: LABOPIA, COCAINSCRNUR, LABBENZ, AMPHETMU, THCU, LABBARB  Alcohol Level No results found for: ETH  IMAGING past 48 hours No results found.  PHYSICAL EXAM Pleasant elderly Caucasian male not in distress. . Afebrile. Head is nontraumatic. Neck is supple without bruit.    Cardiac exam no murmur or gallop. Lungs are clear to auscultation. Distal pulses are well felt. Neurological Exam ;  Awake  Alert oriented x 3. Normal speech and language.eye movements full without nystagmus.fundi were not visualized.  Right eye significantly impaired vision with only small right temporal crescentic vision present.  Normal vision in the left eye.[name] Hearing is normal. Palatal movements are normal.  Face symmetric. Tongue midline. Normal strength, tone, reflexes and coordination. Normal sensation. Gait deferred.  ASSESSMENT/PLAN [name] is a 86 y.o. male with history of lumbar stenosis, hypertension, hyperlipidemia, DVT of lower extremity presenting after waking with painless OD vision loss.   CRAO OD, likely thromboembolic from unstable R ICA stenosis    CT head No acute abnormality. Small vessel disease. Atrophy.     MRI  No acute abnormality. Small vessel disease.   CTA head no significant stenosis  CTA neck proximal R ICA 58% stenosis w/ mixed atherosclerosis   Carotid Doppler  R ICA 40-59% stenosis   2D Echo ejection fraction 60 to 65%.  No cardiac source of embolism.  LDL 128  HgbA1c 5.4  Lovenox 40 mg sq daily for VTE prophylaxis  No antithrombotic prior to admission, now on aspirin 324 mg daily. Change to aspirin 81 and plavix 75 my daily. Continue DAPT x 3 weeks then aspirin alone    Therapy recommendations:  [place] [facility] and likely HH OT  Disposition:  Return home  [facility] for d/c today  Symptomatic R Carotid Stenosis  CTA neck proximal R ICA 58% stenosis w/ mixed atherosclerosis  Carotid Doppler  R ICA 40-59% stenosis   Recommend [facility]VS consult - Sostenes Kauffmann contacted Dr. Trula Slade  Hypertension  Stabl but elevated . BP goal normotensive  Hyperlipidemia  Home meds:  No statin  Now on lipitor 40  LDL 128, goal < 70  Continue statin at discharge  Other Stroke Risk Factors  Advanced age  Former Cigarette smoker, quit 56 yrs ago  for requested labs within last 26280 hours., Cocaine No results found for requested labs within last 26280 hours.. Patient advised to stop using due to stroke risk.  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  Recommend aspirin 81 mg daily.  Follow-up as an outpatient stroke clinic in 6 weeks.  Greater than 50% time during this 25-minute visit was spent on counseling and coordination of care and discussion about stroke prevention treatment and answering questions.  Stroke team will sign off.  Kindly call for questions.  Antony Contras, MD Medical Director [facility] Pager: [phone] 02/07/2020 5:54 PM   To contact Stroke Continuity provider, please refer to [email or web address] After hours, contact General Neurology

## 2020-02-07 NOTE — Anesthesia Postprocedure Evaluation (Signed)
Anesthesia Post Note  Patient: Jay Tran  Procedure(s) Performed: ENDARTERECTOMY CAROTID (Right Neck) PATCH ANGIOPLASTY USING XENOSURE BIOLOGIC PATCH (Right Neck)     Patient location during evaluation: PACU Anesthesia Type: General Level of consciousness: awake and alert Pain management: pain level controlled Vital Signs Assessment: post-procedure vital signs reviewed and stable Respiratory status: spontaneous breathing, nonlabored ventilation and respiratory function stable Cardiovascular status: blood pressure returned to baseline and stable Postop Assessment: no apparent nausea or vomiting Anesthetic complications: no    Last Vitals:  Vitals:   02/07/20 1455 02/07/20 1510  BP: (!) 141/66 (!) 142/69  Pulse: 77 77  Resp: 13 13  Temp:    SpO2: 95% 95%    Last Pain:  Vitals:   02/07/20 1525  TempSrc:   PainSc: Asleep                 Lynda Rainwater

## 2020-02-07 NOTE — Anesthesia Procedure Notes (Signed)
Procedure Name: Intubation Date/Time: 02/07/2020 11:34 AM Performed by: Janene Harvey, CRNA Pre-anesthesia Checklist: Patient identified, Emergency Drugs available, Suction available and Patient being monitored Patient Re-evaluated:Patient Re-evaluated prior to induction Oxygen Delivery Method: Circle system utilized Preoxygenation: Pre-oxygenation with 100% oxygen Induction Type: IV induction Ventilation: Mask ventilation without difficulty Laryngoscope Size: Mac and 4 Grade View: Grade II Tube type: Oral Tube size: 7.5 mm Number of attempts: 1 Airway Equipment and Method: Stylet and Oral airway Placement Confirmation: ETT inserted through vocal cords under direct vision,  positive ETCO2 and breath sounds checked- equal and bilateral Secured at: 23 cm Tube secured with: Tape Dental Injury: Teeth and Oropharynx as per pre-operative assessment

## 2020-02-07 NOTE — Interval H&P Note (Signed)
History and Physical Interval Note:  02/07/2020 9:54 AM  Jay Tran  has presented today for surgery, with the diagnosis of RIGHT CAROTID ARTERY STENOSIS.  The various methods of treatment have been discussed with the patient and family. After consideration of risks, benefits and other options for treatment, the patient has consented to  Procedure(s): ENDARTERECTOMY CAROTID (Right) as a surgical intervention.  The patient's history has been reviewed, patient examined, no change in status, stable for surgery.  I have reviewed the patient's chart and labs.  Questions were answered to the patient's satisfaction.     Annamarie Major

## 2020-02-07 NOTE — Anesthesia Procedure Notes (Addendum)
Arterial Line Insertion Start/End2/19/2021 10:33 AM Performed by: Janene Harvey, CRNA, CRNA  Lidocaine 1% used for infiltration Left, radial was placed Catheter size: 20 G Hand hygiene performed  and maximum sterile barriers used   Attempts: 1 Procedure performed without using ultrasound guided technique. Following insertion, Biopatch and dressing applied. Post procedure assessment: unchanged  Patient tolerated the procedure well with no immediate complications.

## 2020-02-08 LAB — BASIC METABOLIC PANEL
Anion gap: 10 (ref 5–15)
BUN: 12 mg/dL (ref 8–23)
CO2: 25 mmol/L (ref 22–32)
Calcium: 9 mg/dL (ref 8.9–10.3)
Chloride: 102 mmol/L (ref 98–111)
Creatinine, Ser: 0.7 mg/dL (ref 0.61–1.24)
GFR calc Af Amer: >60 mL/min (ref >60)
GFR calc non Af Amer: >60 mL/min (ref >60)
Glucose, Bld: 133 mg/dL — ABNORMAL HIGH (ref 70–99)
Potassium: 4.1 mmol/L (ref 3.5–5.1)
Sodium: 137 mmol/L (ref 135–145)

## 2020-02-08 LAB — CBC
HCT: 39.4 % (ref 39.0–52.0)
Hemoglobin: 13.1 g/dL (ref 13.0–17.0)
MCH: 32.5 pg (ref 26.0–34.0)
MCHC: 33.2 g/dL (ref 30.0–36.0)
MCV: 97.8 fL (ref 80.0–100.0)
Platelets: 296 10*3/uL (ref 150–400)
RBC: 4.03 MIL/uL — ABNORMAL LOW (ref 4.22–5.81)
RDW: 12.4 % (ref 11.5–15.5)
WBC: 13.8 10*3/uL — ABNORMAL HIGH (ref 4.0–10.5)
nRBC: 0 % (ref 0.0–0.2)

## 2020-02-08 MED ORDER — OXYCODONE-ACETAMINOPHEN 5-325 MG PO TABS: 1.0000 | ORAL_TABLET | Freq: Four times a day (QID) | ORAL | 0 refills | Status: DC | PRN

## 2020-02-08 NOTE — Progress Notes (Signed)
Occupational Therapy Treatment Patient Details Name: Jay Tran MRN: [id number] DOB: 1934-06-09 Today's Date: 02/08/2020    History of present illness Pt is an 86 y.o. male admitted 02/04/20 with R eye vision loss. MRI brain negative for CVA. CTA neck and carotid duplex shows R ICA stenosis. Per vascular sx, may benefit from revascularization R ICA. PMH includes HTN, HLD, macular degeneration, chronic back pain (multiple lumbar sxs); of note, pt with bulging lumbar disc, to receive 3rd spinal injection 2/25.   OT comments  Pt seen for OT f/u in anticipation of d/c. Gave pt visual deficits hand out and reviewed safety with R sided visual deficits. Reviewed implications for fall risk in home and community, as well as generally safety in the home. Educated pt to refrain from driving until cleared from MD. Provided visual compensatory strategies for tracking/reading, as well as continued exercise and facilitation for the eyes (games, crossword puzzles, etc.). Will continue to follow acutely, but anticipate pt d/c this date. HHOT still recommended at this time.   Follow Up Recommendations  Home health OT;Supervision/Assistance - 24 hour    Equipment Recommendations  None recommended by OT    Recommendations for Other Services      Precautions / Restrictions Precautions Precautions: Fall;Other (comment) Precaution Comments: R eye impaired vision Restrictions Weight Bearing Restrictions: No       Mobility Bed Mobility               General bed mobility comments: up in chair  Transfers                      Balance Overall balance assessment: Needs assistance Sitting-balance support: No upper extremity supported;Feet supported Sitting balance-Leahy Scale: Good                                     ADL either performed or assessed with clinical judgement   ADL                                         General ADL Comments: reviewed  BADL safety in home environment in application to visual deficits     Vision Baseline Vision/History: Macular Degeneration;Wears glasses(retinal artery occlusion R eye) Wears Glasses: At all times Patient Visual Report: Other (comment)(R eye no vision this date) Vision Assessment?: Yes Eye Alignment: Within Functional Limits Ocular Range of Motion: Within Functional Limits Alignment/Gaze Preference: Within Defined Limits Tracking/Visual Pursuits: Decreased smoothness of horizontal tracking;Decreased smoothness of vertical tracking Saccades: Additional eye shifts occurred during testing Visual Fields: Right visual field deficit Depth Perception: (impaired) Additional Comments: pt states his R eye feels worse this date but does not have his glasses. Pt states he can make out shapes if they are very close to face. Visual education given and described with h/o   Perception     Praxis      Cognition Arousal/Alertness: Awake/alert Behavior During Therapy: WFL for tasks assessed/performed Overall Cognitive Status: Within Functional Limits for tasks assessed                                          Exercises     Shoulder Instructions  General Comments      Pertinent Vitals/ Pain       Pain Assessment: No/denies pain  Home Living                                          Prior Functioning/Environment              Frequency  Min 3X/week        Progress Toward Goals  OT Goals(current goals can now be found in the care plan section)  Progress towards OT goals: Progressing toward goals  Acute Rehab OT Goals Patient Stated Goal: home today OT Goal Formulation: With patient/family Time For Goal Achievement: 02/19/20 Potential to Achieve Goals: Good  Plan Discharge plan remains appropriate    Co-evaluation                 AM-PAC OT "6 Clicks" Daily Activity     Outcome Measure   Help from another person eating  meals?: None Help from another person taking care of personal grooming?: A Little Help from another person toileting, which includes using toliet, bedpan, or urinal?: A Little Help from another person bathing (including washing, rinsing, drying)?: A Little Help from another person to put on and taking off regular upper body clothing?: A Little Help from another person to put on and taking off regular lower body clothing?: A Little 6 Click Score: 19    End of Session    OT Visit Diagnosis: Unsteadiness on feet (R26.81);Other abnormalities of gait and mobility (R26.89);Muscle weakness (generalized) (M62.81);History of falling (Z91.81)   Activity Tolerance Patient tolerated treatment well   Patient Left in chair;with call bell/phone within reach;with nursing/sitter in room   Nurse Communication Mobility status        Time: [id number] OT Time Calculation (min): 9 min  Charges: OT Treatments $Self Care/Home Management : 8-22 mins  Zenovia Jarred, MSOT, OTR/L Acute Rehabilitation Services [facility] Office Number: [phone]  Zenovia Jarred 02/08/2020, 10:45 AM

## 2020-02-08 NOTE — Discharge Summary (Signed)
Physician Discharge Summary  Jay Tran [id number] DOB: [birth year] DOA: 02/04/2020  PCP: Leonard Downing, MD  Admit date: 02/04/2020 Discharge date: 02/08/2020  Time spent: 35 minutes  Recommendations for Outpatient Follow-up:  PCP in 1 week Vascular surgery Dr. 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 Patient was sent to the ED by his Ophthalmologist for stroke work-up after found to have a right sided CRAO (central retinal artery occlusion).  2/15, patient went to bed in his usual state of health.  Next morning he woke up completely blind in his right eye. He immediately called his Ophthalmologist, got evaluated in the office and was found to have central retinal artery occlusion. Patient was immediately sent to the hospital to be evaluated for stroke.  In the ED, patient was afebrile, intermittently hypertensive up to systolic of [id number] on room air. MRI brain negative. CTA head and neck showed 58% stenosis of the proximal right ICA  Hospital Course:   AcuteRight eye vision loss Embolic stroke to right retinal artery. -Found to have CRAO by ophthalmologist. -Admitted for stroke work-up, MRI brain negative. -echocardiogram normal. -CTA head and neck showed 58% stenosis of the proximal right ICA. -started on aspirin and statin.   -Hemoglobin A1c 5.4, LDL low at 29, LDL elevated to 128. -PT/OT/ST eval  obtained.  Home with home health -FU with ophthalmology and [place] neurology  Proximal right ICA stenosis -58% stenosis -Vascular surgery consulted, underwent right carotid endarterectomy yesterday on 2/19 -Stable postop and cleared for discharge by vascular surgery, recommended to continue aspirin and statin -Follow-up with Dr. Trula Slade in 1 to 2 weeks  HTN -Resume HCTZ and lisinopril  BPH  Continue Proscar and Flomax   GERD Continue PPI  Chronic back pain Continue PRN Tramadol and Tizanidine  Procedures: Pre-operative Diagnosis: symptomatic   right carotid stenosis Surgeon:  Annamarie Major Procedure:    right carotid Endarterectomy with bovine pericardial patch angioplasty Anesthesia:  General  Consultations:  VVS  Discharge Exam: Vitals:   02/08/20 0400 02/08/20 0741  BP: (!) 169/71 (!) 145/76  Pulse: 77 78  Resp: 13 16  Temp: 98.1 F (36.7 C) 98.9 F (37.2 C)  SpO2: 97% 99%    General: AAOx3 Cardiovascular: S1S2/RRR Respiratory: CTAB  Discharge Instructions   Discharge Instructions    Ambulatory referral to Neurology   Complete by: As directed    Follow up in stroke clinic at [facility] with Frann Rider, NP in about 4 weeks. If not available, consider Dr. Antony Contras, Dr. Bess Harvest, or Dr. Sarina Ill.   Diet - low sodium heart healthy   Complete by: As directed    Diet - low sodium heart healthy   Complete by: As directed    Increase activity slowly   Complete by: As directed    Increase activity slowly   Complete by: As directed      Allergies as of 02/08/2020   No Known Allergies  Medication List    STOP taking these medications   ALKA-SELTZER PLUS COLD PO   naproxen sodium 220 MG tablet Commonly known as: ALEVE     TAKE these medications   aspirin 81 MG chewable tablet Chew 1 tablet (81 mg total) by mouth daily.   atorvastatin 40 MG tablet Commonly known as: LIPITOR Take 1 tablet (40 mg  total) by mouth daily at 6 PM.   azelastine 0.1 % nasal spray Commonly known as: ASTELIN Place 2 sprays into both nostrils 2 (two) times daily. Use in each nostril as directed   cromolyn 4 % ophthalmic solution Commonly known as: OPTICROM Place 1 drop into both eyes in the morning, at noon, and at bedtime.   finasteride 5 MG tablet Commonly known as: PROSCAR Take 5 mg by mouth at bedtime.   Fish Oil 1200 MG Caps Take 1,200 mg by mouth daily with breakfast.   hydrochlorothiazide 50 MG tablet Commonly known as: HYDRODIURIL Take 50 mg by mouth at bedtime.   lisinopril 20 MG tablet Commonly known as: ZESTRIL Take 20 mg by mouth at bedtime.   mometasone 50 MCG/ACT nasal spray Commonly known as: Nasonex Place 2 sprays into the nose daily.   omeprazole 40 MG capsule Commonly known as: PRILOSEC Take 1 capsule (40 mg total) by mouth daily.   oxyCODONE-acetaminophen 5-325 MG tablet Commonly known as: PERCOCET/ROXICET Take 1 tablet by mouth every 6 (six) hours as needed for moderate pain.   PRESERVISION AREDS 2 PO Take 1 capsule by mouth 2 (two) times daily.   QC TUMERIC COMPLEX PO Take 1 capsule by mouth daily with breakfast.   SALONPAS PAIN RELIEF PATCH EX Place 1 patch onto the skin daily as needed (to affected area for pain).   Systane 0.4-0.3 % Soln Generic drug: Polyethyl Glycol-Propyl Glycol Place 1 drop into both eyes in the morning, at noon, and at bedtime.   tamsulosin 0.4 MG Caps capsule Commonly known as: FLOMAX Take 0.4 mg by mouth at bedtime.   tiZANidine 4 MG tablet Commonly known as: Zanaflex Take 1 tablet (4 mg total) by mouth every 8 (eight) hours as needed for muscle spasms. What changed: when to take this   traMADol 50 MG tablet Commonly known as: ULTRAM Take 50 mg by mouth every 6 (six) hours as needed (for pain).   trimethoprim-polymyxin b ophthalmic solution Commonly known as: POLYTRIM Place 1 drop into the right eye See admin instructions.  INSTILL 1 DROP INTO THE RIGHT EYE 4 TIMES DAILY FOR 2 DAYS AFTER EACH MONTHLY EYE INJECTION      No Known Allergies Follow-up Information    [facility] Follow up in 4 week(s).   Specialty: Neurology Why: stroke clinic. office will call with appt date and time.  Contact information: [address] [place] [phone]       Leonard Downing, MD Follow up.   Specialty: Family Medicine Contact information: [place] [address] [phone]        Serafina Mitchell, MD. Schedule an appointment as soon as possible for a visit in 3 week(s).   Specialties: Vascular Surgery, Cardiology Why: office will call to arrange appointment (sent) Contact information: [place] [address] [phone]            The results of significant diagnostics from this hospitalization (including imaging, microbiology, ancillary and laboratory) are listed below for reference.    Significant Diagnostic Studies: CT ANGIO  HEAD W OR WO CONTRAST  Result Date: 02/04/2020 CLINICAL DATA:  Stroke follow-up EXAM: CT ANGIOGRAPHY HEAD AND NECK TECHNIQUE: Multidetector CT imaging of the head and neck was performed using the standard protocol during bolus administration of intravenous contrast. Multiplanar CT image reconstructions and MIPs were obtained to evaluate the vascular anatomy. Carotid stenosis measurements (when applicable) are obtained utilizing NASCET criteria, using the distal internal carotid diameter as the denominator. CONTRAST:  156mL OMNIPAQUE IOHEXOL 350 MG/ML SOLN COMPARISON:  None. FINDINGS: CTA NECK FINDINGS SKELETON: 1 OTHER NECK: Normal pharynx, larynx and major salivary glands. No cervical lymphadenopathy. Enlarged left lobe of the thyroid gland. UPPER CHEST: No pneumothorax or pleural effusion. No nodules or masses. AORTIC ARCH: There is no calcific atherosclerosis of the aortic arch. There is  no aneurysm, dissection or hemodynamically significant stenosis of the visualized portion of the aorta. Conventional 3 vessel aortic branching pattern. The visualized proximal subclavian arteries are widely patent. RIGHT CAROTID SYSTEM: No dissection, occlusion or aneurysm. There is mixed density atherosclerosis extending into the proximal ICA, resulting in 58% stenosis. LEFT CAROTID SYSTEM: No dissection, occlusion or aneurysm. Mild atherosclerotic calcification at the carotid bifurcation without hemodynamically significant stenosis. VERTEBRAL ARTERIES: Left dominant configuration. Both origins are clearly patent. There is no dissection, occlusion or flow-limiting stenosis to the skull base (V1-V3 segments). CTA HEAD FINDINGS POSTERIOR CIRCULATION: --Vertebral arteries: Normal V4 segments. --Posterior inferior cerebellar arteries (PICA): Patent origins from the vertebral arteries. --Anterior inferior cerebellar arteries (AICA): Patent origins from the basilar artery. --Basilar artery: Normal. --Superior cerebellar arteries: Normal. --Posterior cerebral arteries: Normal. Both originate from the basilar artery. Posterior communicating arteries (p-comm) are diminutive or absent. ANTERIOR CIRCULATION: --Intracranial internal carotid arteries: Normal. --Anterior cerebral arteries (ACA): Normal. Both A1 segments are present. Patent anterior communicating artery (a-comm). --Middle cerebral arteries (MCA): Normal. VENOUS SINUSES: As permitted by contrast timing, patent. ANATOMIC VARIANTS: None Review of the MIP images confirms the above findings. IMPRESSION: 1. No intracranial arterial occlusion or hemodynamically significant stenosis. 2. There is 58% stenosis of the proximal right ICA secondary to mixed density atherosclerosis. 3. Enlarged left thyroid lobe. Electronically Signed   By: Ulyses Jarred M.D.   On: 02/04/2020 19:52   CT HEAD WO CONTRAST  Result Date: 02/04/2020 CLINICAL DATA:  Acute neurological deficit.  EXAM: CT HEAD WITHOUT CONTRAST TECHNIQUE: Contiguous axial images were obtained from the base of the skull through the vertex without intravenous contrast. COMPARISON:  None. FINDINGS: Brain: No evidence of acute infarction, hemorrhage, extra-axial collection, ventriculomegaly, or mass effect. Generalized cerebral atrophy. Periventricular white matter low attenuation likely secondary to microangiopathy. Vascular: Cerebrovascular atherosclerotic calcifications are noted. Skull: Negative for fracture or focal lesion. Sinuses/Orbits: Visualized portions of the orbits are unremarkable. Visualized portions of the paranasal sinuses and mastoid air cells are unremarkable. Other: None. IMPRESSION: 1. No acute intracranial pathology. 2. Chronic microvascular disease and cerebral atrophy. Electronically Signed   By: Kathreen Devoid   On: 02/04/2020 15:27   CT ANGIO NECK W OR WO CONTRAST  Result Date: 02/04/2020 CLINICAL DATA:  Stroke follow-up EXAM: CT ANGIOGRAPHY HEAD AND NECK TECHNIQUE: Multidetector CT imaging of the head and neck was performed using the standard protocol during bolus administration of intravenous contrast. Multiplanar CT image reconstructions and MIPs were obtained to evaluate the vascular anatomy. Carotid stenosis measurements (when applicable) are obtained utilizing NASCET criteria, using the distal internal carotid diameter as the denominator. CONTRAST:  169mL OMNIPAQUE IOHEXOL 350 MG/ML SOLN COMPARISON:  None. FINDINGS: CTA NECK FINDINGS SKELETON: 1 OTHER  NECK: Normal pharynx, larynx and major salivary glands. No cervical lymphadenopathy. Enlarged left lobe of the thyroid gland. UPPER CHEST: No pneumothorax or pleural effusion. No nodules or masses. AORTIC ARCH: There is no calcific atherosclerosis of the aortic arch. There is no aneurysm, dissection or hemodynamically significant stenosis of the visualized portion of the aorta. Conventional 3 vessel aortic branching pattern. The visualized proximal  subclavian arteries are widely patent. RIGHT CAROTID SYSTEM: No dissection, occlusion or aneurysm. There is mixed density atherosclerosis extending into the proximal ICA, resulting in 58% stenosis. LEFT CAROTID SYSTEM: No dissection, occlusion or aneurysm. Mild atherosclerotic calcification at the carotid bifurcation without hemodynamically significant stenosis. VERTEBRAL ARTERIES: Left dominant configuration. Both origins are clearly patent. There is no dissection, occlusion or flow-limiting stenosis to the skull base (V1-V3 segments). CTA HEAD FINDINGS POSTERIOR CIRCULATION: --Vertebral arteries: Normal V4 segments. --Posterior inferior cerebellar arteries (PICA): Patent origins from the vertebral arteries. --Anterior inferior cerebellar arteries (AICA): Patent origins from the basilar artery. --Basilar artery: Normal. --Superior cerebellar arteries: Normal. --Posterior cerebral arteries: Normal. Both originate from the basilar artery. Posterior communicating arteries (p-comm) are diminutive or absent. ANTERIOR CIRCULATION: --Intracranial internal carotid arteries: Normal. --Anterior cerebral arteries (ACA): Normal. Both A1 segments are present. Patent anterior communicating artery (a-comm). --Middle cerebral arteries (MCA): Normal. VENOUS SINUSES: As permitted by contrast timing, patent. ANATOMIC VARIANTS: None Review of the MIP images confirms the above findings. IMPRESSION: 1. No intracranial arterial occlusion or hemodynamically significant stenosis. 2. There is 58% stenosis of the proximal right ICA secondary to mixed density atherosclerosis. 3. Enlarged left thyroid lobe. Electronically Signed   By: Ulyses Jarred M.D.   On: 02/04/2020 19:52   MR BRAIN WO CONTRAST  Result Date: 02/04/2020 CLINICAL DATA:  Stroke follow-up. Central retinal artery occlusion. EXAM: MRI HEAD WITHOUT CONTRAST TECHNIQUE: Multiplanar, multiecho pulse sequences of the brain and surrounding structures were obtained without  intravenous contrast. COMPARISON:  None. FINDINGS: Brain: No acute infarct, acute hemorrhage or extra-axial collection. Multifocal white matter hyperintensity, most commonly due to chronic ischemic microangiopathy. Normal volume of CSF spaces. No chronic microhemorrhage. Normal midline structures. Vascular: Normal flow voids. Skull and upper cervical spine: Normal marrow signal. Sinuses/Orbits: Negative. Other: None. IMPRESSION: 1. No acute intracranial abnormality. 2. Multifocal white matter hyperintensity, most commonly due to chronic ischemic microangiopathy. Electronically Signed   By: Ulyses Jarred M.D.   On: 02/04/2020 19:20   ECHOCARDIOGRAM COMPLETE  Result Date: 02/05/2020    ECHOCARDIOGRAM REPORT   Patient Name:   Roper D Morren Date of Exam: 02/05/2020 Medical Rec #:  [id number]     Height:       70.0 in Accession #:    [id number]    Weight:       191.4 lb Date of Birth:  07/19/1934     BSA:          2.05 m Patient Age:    16 years      BP:           165/71 mmHg Patient Gender: M             HR:           75 bpm. Exam Location:  Inpatient Procedure: 2D Echo, Cardiac Doppler and Color Doppler Indications:    TIA  History:        Patient has no prior history of Echocardiogram examinations.                 Risk Factors:Hypertension and Dyslipidemia.  Sonographer:  Dustin Flock Referring Phys: [id number] [place]  1. Left ventricular ejection fraction, by estimation, is 60 to 65%. The left ventricle has normal function. The left ventricle has no regional Knoble motion abnormalities. The left ventricular internal cavity size was mildly dilated. There is mild concentric left ventricular hypertrophy. Left ventricular diastolic parameters are indeterminate.  2. Right ventricular systolic function is mildly reduced. The right ventricular size is mildly enlarged.  3. Left atrial size was mildly dilated.  4. The mitral valve is normal in structure and function. Trivial mitral valve regurgitation. No  evidence of mitral stenosis.  5. The aortic valve is tricuspid. Aortic valve regurgitation is mild. Mild to moderate aortic valve sclerosis/calcification is present, without any evidence of aortic stenosis.  6. Pulmonic valve regurgitation is moderate.  7. The inferior vena cava is normal in size with greater than 50% respiratory variability, suggesting right atrial pressure of 3 mmHg. Conclusion(s)/Recomendation(s): Normal biventricular function without evidence of hemodynamically significant valvular heart disease. FINDINGS  Left Ventricle: Left ventricular ejection fraction, by estimation, is 60 to 65%. The left ventricle has normal function. The left ventricle has no regional Ralston motion abnormalities. The left ventricular internal cavity size was mildly dilated. There is  mild concentric left ventricular hypertrophy. Left ventricular diastolic parameters are indeterminate. Right Ventricle: The right ventricular size is mildly enlarged. No increase in right ventricular Mersch thickness. Right ventricular systolic function is mildly reduced. Left Atrium: Left atrial size was mildly dilated. Right Atrium: Right atrial size was normal in size. Pericardium: There is no evidence of pericardial effusion. Mitral Valve: The mitral valve is normal in structure and function. Trivial mitral valve regurgitation. No evidence of mitral valve stenosis. Tricuspid Valve: The tricuspid valve is normal in structure. Tricuspid valve regurgitation is trivial. No evidence of tricuspid stenosis. Aortic Valve: The aortic valve is tricuspid. . There is mild thickening and moderate calcification of the aortic valve. Aortic valve regurgitation is mild. Mild to moderate aortic valve sclerosis/calcification is present, without any evidence of aortic stenosis. There is mild thickening of the aortic valve. There is moderate calcification of the aortic valve. Pulmonic Valve: The pulmonic valve was grossly normal. Pulmonic valve regurgitation is  moderate. No evidence of pulmonic stenosis. Aorta: The aortic root, ascending aorta and aortic arch are all structurally normal, with no evidence of dilitation or obstruction. Venous: The inferior vena cava is normal in size with greater than 50% respiratory variability, suggesting right atrial pressure of 3 mmHg. IAS/Shunts: No atrial level shunt detected by color flow Doppler.  LEFT VENTRICLE PLAX 2D LVIDd:         5.30 cm  Diastology LVIDs:         3.00 cm  LV e' lateral:   6.09 cm/s LV PW:         1.30 cm  LV E/e' lateral: 10.0 LV IVS:        1.20 cm  LV e' medial:    6.53 cm/s LVOT diam:     2.30 cm  LV E/e' medial:  9.3 LV SV:         65.65 ml LV SV Index:   48.09 LVOT Area:     4.15 cm  RIGHT VENTRICLE RV Basal diam:  2.30 cm RV S prime:     10.70 cm/s TAPSE (M-mode): 1.4 cm LEFT ATRIUM             Index       RIGHT ATRIUM  Index LA diam:        4.60 cm 2.25 cm/m  RA Area:     12.40 cm LA Vol (A2C):   34.1 ml 16.64 ml/m RA Volume:   26.50 ml  12.94 ml/m LA Vol (A4C):   71.9 ml 35.10 ml/m LA Biplane Vol: 51.4 ml 25.09 ml/m  AORTIC VALVE LVOT Vmax:   86.60 cm/s LVOT Vmean:  54.100 cm/s LVOT VTI:    0.158 m  AORTA Ao Root diam: 3.40 cm MITRAL VALVE MV Area (PHT): 2.24 cm    SHUNTS MV Decel Time: 338 msec    Systemic VTI:  0.16 m MV E velocity: 60.90 cm/s  Systemic Diam: 2.30 cm MV A velocity: 71.00 cm/s MV E/A ratio:  0.86 Buford Dresser MD Electronically signed by Buford Dresser MD Signature Date/Time: 02/05/2020/8:41:48 PM    Final    VAS US CAROTID  Result Date: 02/05/2020 Carotid Arterial Duplex Study Indications:       Right eye vision loss. Risk Factors:      Hypertension, hyperlipidemia. Comparison Study:  No prior. CT angio of the neck showed right ICA 58% on                    02/04/20. Performing Technologist: Oda Cogan RDMS, RVT  Examination Guidelines: A complete evaluation includes B-mode imaging, spectral Doppler, color Doppler, and power Doppler as needed of  all accessible portions of each vessel. Bilateral testing is considered an integral part of a complete examination. Limited examinations for reoccurring indications may be performed as noted.  Right Carotid Findings: +----------+--------+--------+--------+---------------------+------------------+           PSV cm/sEDV cm/sStenosisPlaque Description   Comments           +----------+--------+--------+--------+---------------------+------------------+ CCA Prox  120     14                                                      +----------+--------+--------+--------+---------------------+------------------+ CCA Distal83      14                                                      +----------+--------+--------+--------+---------------------+------------------+ ICA Prox  230     54      40-59%  heterogenous, focal  velocities suggest                                   and irregular        upper end of scale +----------+--------+--------+--------+---------------------+------------------+ ICA Mid   203     39      40-59%  heterogenous                            +----------+--------+--------+--------+---------------------+------------------+ ICA Distal98      19                                                      +----------+--------+--------+--------+---------------------+------------------+  ECA       164     15                                                      +----------+--------+--------+--------+---------------------+------------------+ +----------+--------+-------+----------------+-------------------+           PSV cm/sEDV cmsDescribe        Arm Pressure (mmHG) +----------+--------+-------+----------------+-------------------+ [id number]             Multiphasic, WNL                    +----------+--------+-------+----------------+-------------------+ +---------+--------+--+--------+--+---------+ VertebralPSV cm/s68EDV cm/s12Antegrade  +---------+--------+--+--------+--+---------+  Left Carotid Findings: +----------+--------+--------+--------+------------------+--------+           PSV cm/sEDV cm/sStenosisPlaque DescriptionComments +----------+--------+--------+--------+------------------+--------+ CCA Prox  115     17                                         +----------+--------+--------+--------+------------------+--------+ CCA Distal88      17                                         +----------+--------+--------+--------+------------------+--------+ ICA Prox  86      20      1-39%   heterogenous               +----------+--------+--------+--------+------------------+--------+ ICA Mid   104     25                                         +----------+--------+--------+--------+------------------+--------+ ICA Distal109     24                                         +----------+--------+--------+--------+------------------+--------+ ECA       103     13                                         +----------+--------+--------+--------+------------------+--------+ +----------+--------+--------+--------+-------------------+           PSV cm/sEDV cm/sDescribeArm Pressure (mmHG) +----------+--------+--------+--------+-------------------+ [id number]                                         +----------+--------+--------+--------+-------------------+ +---------+--------+---+--------+--+ VertebralPSV cm/s107EDV cm/s19 +---------+--------+---+--------+--+   Summary: Right Carotid: Velocities in the right ICA are consistent with a 40-59%                stenosis. Greater than 60% stenosis by peak sys velocity. Left Carotid: Velocities in the left ICA are consistent with a 1-39% stenosis. Vertebrals:  Bilateral vertebral arteries demonstrate antegrade flow. Subclavians: Normal flow hemodynamics were seen in bilateral subclavian              arteries. *See table(s) above for measurements and  observations.  Electronically signed by Antony Contras  MD on 02/05/2020 at 1:44:20 PM.    Final     Microbiology: Recent Results (from the past 240 hour(s))  SARS CORONAVIRUS 2 (TAT 6-24 HRS) Nasopharyngeal Nasopharyngeal Swab     Status: None   Collection Time: 02/04/20  6:12 PM   Specimen: Nasopharyngeal Swab  Result Value Ref Range Status   SARS Coronavirus 2 NEGATIVE NEGATIVE Final    Comment: (NOTE) SARS-CoV-2 target nucleic acids are NOT DETECTED. The SARS-CoV-2 RNA is generally detectable in upper and lower respiratory specimens during the acute phase of infection. Negative results do not preclude SARS-CoV-2 infection, do not rule out co-infections with other pathogens, and should not be used as the sole basis for treatment or other patient management decisions. Negative results must be combined with clinical observations, patient history, and epidemiological information. The expected result is Negative. Fact Sheet for Patients: SugarRoll.be Fact Sheet for Healthcare Providers: https://www.woods-mathews.com/ This test is not yet approved or cleared by the [place] FDA and  has been authorized for detection and/or diagnosis of SARS-CoV-2 by FDA under an Emergency Use Authorization (EUA). This EUA will remain  in effect (meaning this test can be used) for the duration of the COVID-19 declaration under Section 56 4(b)(1) of the Act, 21 U.S.C. section 360bbb-3(b)(1), unless the authorization is terminated or revoked sooner. Performed at [facility], [place] [address], [place], [place] [address]   Surgical pcr screen     Status: None   Collection Time: 02/06/20  8:42 PM   Specimen: Nasal Mucosa; Nasal Swab  Result Value Ref Range Status   MRSA, PCR NEGATIVE NEGATIVE Final   Staphylococcus aureus NEGATIVE NEGATIVE Final    Comment: (NOTE) The Xpert SA Assay (FDA approved for NASAL specimens in patients 43 years of age and  older), is one component of a comprehensive surveillance program. It is not intended to diagnose infection nor to guide or monitor treatment. Performed at [facility], [place] [address] [place], [place] [address]      Labs: Basic Metabolic Panel: Recent Labs  Lab 02/04/20 1227 02/04/20 1326 02/08/20 0431  NA 134* 132* 137  K 3.6 3.4* 4.1  CL 98 95* 102  CO2 24  --  25  GLUCOSE 105* 99 133*  BUN 12 16 12   CREATININE 0.95 0.90 0.70  CALCIUM 9.0  --  9.0   Liver Function Tests: Recent Labs  Lab 02/04/20 1227  AST 22  ALT 5  ALKPHOS 53  BILITOT 1.3*  PROT 7.1  ALBUMIN 4.2   No results for input(s): LIPASE, AMYLASE in the last 168 hours. No results for input(s): AMMONIA in the last 168 hours. CBC: Recent Labs  Lab 02/04/20 1227 02/04/20 1326 02/08/20 0431  WBC 7.2  --  13.8*  NEUTROABS 5.1  --   --   HGB 15.0 14.6 13.1  HCT 43.3 43.0 39.4  MCV 94.5  --  97.8  PLT 320  --  296   Cardiac Enzymes: Recent Labs  Lab 02/04/20 2215  CKTOTAL 225   BNP: BNP (last 3 results) No results for input(s): BNP in the last 8760 hours.  ProBNP (last 3 results) No results for input(s): PROBNP in the last 8760 hours.  CBG: No results for input(s): GLUCAP in the last 168 hours.     Signed:  Domenic Polite MD.  [facility] 02/08/2020, 10:00 AM

## 2020-02-08 NOTE — Progress Notes (Signed)
   VASCULAR SURGERY ASSESSMENT & PLAN:   POD 1 - S/P RIGHT CAROTID ENDARTERECTOMY: Doing well.    VASCULAR QUALITY INITIATIVE: The patient is on aspirin and is on a statin.  Okay for discharge today from a vascular standpoint.   SUBJECTIVE:   No complaints.  Wants to go home today.  PHYSICAL EXAM:   Vitals:   02/08/20 0100 02/08/20 0200 02/08/20 0400 02/08/20 0741  BP:  (!) 159/69 (!) 169/71 (!) 145/76  Pulse:   77 78  Resp:  14 13 16   Temp:   98.1 F (36.7 C) 98.9 F (37.2 C)  TempSrc:   Oral Oral  SpO2: 97% 96% 97% 99%  Weight:      Height:       Mild ecchymosis right neck incision. NEURO: Intact  LABS:   Lab Results  Component Value Date   WBC 13.8 (H) 02/08/2020   HGB 13.1 02/08/2020   HCT 39.4 02/08/2020   MCV 97.8 02/08/2020   PLT 296 02/08/2020   Lab Results  Component Value Date   CREATININE 0.70 02/08/2020   PROBLEM LIST:    Principal Problem:   Vision loss of right eye Active Problems:   Internal carotid artery stenosis   Essential hypertension   BPH (benign prostatic hyperplasia)   GERD (gastroesophageal reflux disease)   Chronic back pain   CURRENT MEDS:   .  stroke: mapping our early stages of recovery book   Does not apply Once  . aspirin  81 mg Oral Daily  . atorvastatin  40 mg Oral q1800  . docusate sodium  100 mg Oral Daily  . finasteride  5 mg Oral QHS  . pantoprazole  40 mg Oral Daily  . tamsulosin  0.4 mg Oral QHS    Deitra Mayo Office: [phone] 02/08/2020

## 2020-02-08 NOTE — Progress Notes (Signed)
Pt ambulated x 300 feet with front wheel walker, pt tolerated well

## 2020-02-08 NOTE — Discharge Instructions (Signed)
[facility]  Discharge Instructions   Carotid Endarterectomy (CEA)  Please refer to the following instructions for your post-procedure care. Your surgeon or physician assistant will discuss any changes with you.  Activity  You are encouraged to walk as much as you can. You can slowly return to normal activities but must avoid strenuous activity and heavy lifting until your doctor tell you it's OK. Avoid activities such as vacuuming or swinging a golf club. You can drive after one week if you are comfortable and you are no longer taking prescription pain medications. It is normal to feel tired for serval weeks after your surgery. It is also normal to have difficulty with sleep habits, eating, and bowel movements after surgery. These will go away with time.  Bathing/Showering  You may shower after you come home. Do not soak in a bathtub, hot tub, or swim until the incision heals completely.  Incision Care  Shower every day. Clean your incision with mild soap and water. Pat the area dry with a clean towel. You do not need a bandage unless otherwise instructed. Do not apply any ointments or creams to your incision. You may have skin glue on your incision. Do not peel it off. It will come off on its own in about one week. Your incision may feel thickened and raised for several weeks after your surgery. This is normal and the skin will soften over time. For Men Only: It's OK to shave around the incision but do not shave the incision itself for 2 weeks. It is common to have numbness under your chin that could last for several months.  Diet  Resume your normal diet. There are no special food restrictions following this procedure. A low fat/low cholesterol diet is recommended for all patients with vascular disease. In order to heal from your surgery, it is CRITICAL to get adequate nutrition. Your body requires vitamins, minerals, and protein. Vegetables are the best  source of vitamins and minerals. Vegetables also provide the perfect balance of protein. Processed food has little nutritional value, so try to avoid this.        Medications  Resume taking all of your medications unless your doctor or physician assistant tells you not to. If your incision is causing pain, you may take over-the- counter pain relievers such as acetaminophen (Tylenol). If you were prescribed a stronger pain medication, please be aware these medications can cause nausea and constipation. Prevent nausea by taking the medication with a snack or meal. Avoid constipation by drinking plenty of fluids and eating foods with a high amount of fiber, such as fruits, vegetables, and grains. Do not take Tylenol if you are taking prescription pain medications.  Follow Up  Our office will schedule a follow up appointment 2-3 weeks following discharge.  Please call us immediately for any of the following conditions  Increased pain, redness, drainage (pus) from your incision site. Fever of 101 degrees or higher. If you should develop stroke (slurred speech, difficulty swallowing, weakness on one side of your body, loss of vision) you should call 911 and go to the nearest emergency room.  Reduce your risk of vascular disease:  Stop smoking. If you would like help call [facility] at 1-800-QUIT-NOW (281)257-1270) or [facility] at [phone]. Manage your cholesterol Maintain a desired weight Control your diabetes Keep your blood pressure down  If you have any questions, please call the office at [phone]. Central Retinal Artery Occlusion, Adult  Central retinal  artery occlusion (CRAO) is a blockage in the main blood vessel that carries blood to the retina (central retinal artery). The retina is the part of your eye that senses light and sends signals to the brain that allow you to see. CRAO can make you lose some or all of your vision. If the condition is not treated within 90  minutes, it can result in permanent vision loss in the affected eye. What are the causes? This condition may be caused by:  A blood clot that forms in the artery (thrombus). A clot is blood that has thickened into a gel or solid.  A collection of blood or solid material, such as fat that forms somewhere else and travels to the artery (embolus).  A disease that causes the artery to swell (vasculitis).  An infection of the heart's inner lining (endocarditis).  An injury to the artery. Sometimes the cause is not known. What increases the risk? This condition is more likely to develop in:  Males.  Adults who are age 38 or older.  Women who take birth control pills.  Pregnant women.  People who have certain medical conditions, including: ? A blood vessel disease that causes narrowing of the arteries (atherosclerosis). ? High levels of fat in the blood (hyperlipidemia). ? Giant cell arteritis. ? Diabetes. ? Heart disease. ? Sickle cell disease. ? High blood pressure (hypertension). ? A heart rhythm problem (atrial fibrillation). ? A heart valve condition. ? A blood-clotting disorder (coagulopathy). ? Inflammation of blood vessels (vasculitis). What are the signs or symptoms? The only symptom of this condition is sudden and painless vision loss. How is this diagnosed? This condition is usually diagnosed by a health care provider who specializes in eye diseases (ophthalmologist). To diagnose the condition, the ophthalmologist will do an eye exam. She or he may also:  Do an exam that involves having eye drops put in the eye (slit lamp exam or dilated fundus exam).  Order tests, including: ? A vision test. ? A measurement of the pressure inside your eye. ? A test that checks the nerves in your retina (electroretinogram). ? A fluorescein angiogram. In this test, pictures are taken of your retina after a dye is injected into your bloodstream. ? Optical coherence tomography. In  this test, light waves are used to create pictures of your retina.  Check your blood pressure.  Order other tests to find the cause of the condition. These tests may include: ? Blood tests to check for vasculitis or coagulopathy. ? An ultrasound to check for heart disease or atherosclerosis. How is this treated? Treatment for this condition may include:  Massaging the eye.  Medicines, such as steroids to treat any underlying inflammation.  Breathing in a mixture of oxygen and carbon dioxide. This widens (dilates) the arteries of your retina.  Having fluid removed from the front of your eye.  Using drops to reduce pressure in your eye.  An injection of a clot-busting medicine. You may be referred to a specialist or receive additional treatment if any of these apply:  Your condition was caused by a blood clot that formed outside your eye.  Your optic nerve is swollen.  You have symptoms of a condition called giant cell arteritis. Symptoms of this condition include: ? Headache. ? Pain with chewing. ? Scalp tenderness. ? Recent poor appetite and weight loss. Follow these instructions at home:  Use over-the-counter and prescription medicines only as told by your health care provider. These include any eye drops.  If you lose some vision again: ? Breathe into a paper bag. ? Massage your eye as directed. ? Get medical care right away.  Keep all follow-up visits as told by your health care provider. This is important. How is this prevented? To help prevent this condition:  Work with your health care providers to reduce your risk factors.  Do not use any products that contain nicotine or tobacco, such as cigarettes and e-cigarettes. If you need help quitting, ask your health care provider.  Maintain a healthy weight.  Follow a heart-healthy diet. This diet includes a lot of fruits, vegetables, grains, and lean protein. It is low in saturated fat and sugar.  Exercise  regularly. Contact a health care provider if:  You have any changes in your vision. Get help right away if:  You have eye pain.  You have new or sudden vision loss. Summary  Central retinal artery occlusion (CRAO) is a blockage in the main blood vessel that carries blood to the retina (central retinal artery). The retina is the part of your eye that senses light and sends signals to the brain that allow you to see.  CRAO can make you lose some or all of your vision. If the condition is not treated within 90 minutes, it can result in permanent vision loss in the affected eye.  Treatment for this condition may include medicines, eye massage, eye drops, and certain procedures.  Get help right away if you have new or sudden vision loss. This information is not intended to replace advice given to you by your health care provider. Make sure you discuss any questions you have with your health care provider. Document Revised: 11/17/2017 Document Reviewed: 02/10/2017 Elsevier Patient Education  [place].   Carotid Artery Disease  Carotid artery disease, also called carotid artery stenosis, is the narrowing or blockage of one or both carotid arteries. The carotid arteries are the two main blood vessels on either side of the neck. They supply blood to the brain, other parts of the head, and the neck. Carotid artery disease increases your risk for a stroke or a transient ischemic attack (TIA). A TIA is a "mini-stroke" that causes stroke-like symptoms that then go away quickly. What are the causes? This condition is mainly caused by a narrowing and hardening of the carotid arteries (atherosclerosis). The carotid arteries can become narrow or clogged with a buildup of fat, cholesterol, calcium, and other substances (plaque). What increases the risk? The following factors may make you more likely to develop this condition:  Having certain medical conditions, such as: ? High  cholesterol. ? High blood pressure (hypertension). ? Diabetes. ? Obesity.  Smoking.  A family history of cardiovascular disease.  Inactivity or lack of regular exercise.  Being male. Men have an increased risk of developing atherosclerosis earlier in life than women.  Old age. What are the signs or symptoms? This condition may not have any signs or symptoms until a stroke or TIA occurs. In some cases, your health care provider may be able to hear a whooshing sound (bruit). This can indicate a change in blood flow caused by plaque buildup. An eye exam can also help identify signs of the condition. How is this diagnosed? This condition may be diagnosed with a physical exam, your medical history, and your family's medical history. You may also have tests that look at the blood flow in your carotid arteries, such as:  Carotid artery ultrasound, which uses sound waves to create  pictures to show if the arteries are narrow or blocked.  Tests that use a dye injected into a vein to highlight your arteries on images, such as: ? Carotid or cerebral angiography, which uses X-rays. ? Computerized tomographic angiography (CTA), which uses CT scans. ? Magnetic resonance angiography (MRA), which uses MRI. How is this treated? This condition may be treated with a combination of treatments. Treatment options include:  Lifestyle changes, such as: ? Quitting smoking. ? Exercising regularly or as told by your health care provider. ? Eating a heart-healthy diet. ? Managing stress. ? Maintaining a healthy weight.  Medicines to control blood pressure, cholesterol, and blood clotting.  Surgery. You may have: ? A carotid endarterectomy. This is a surgery to remove the blockages in the carotid arteries. ? A carotid angioplasty with stenting. This is a procedure in which a small mesh tube (stent) is used to widen the blocked carotid arteries. Follow these instructions at home: Eating and  drinking Follow instructions about your diet from your health care provider. It is important to:  Eat a healthy diet that is low in saturated fats and includes plenty of fresh fruits, vegetables, and lean meats.  Avoid foods that are high in fat and salt (sodium).  Avoid foods that are fried, overly processed, or have poor nutritional value.  Lifestyle   Maintain a healthy weight.  Do exercises as told by your health care provider to stay physically active. It is recommended that each week you get at least 150 minutes of moderate-intensity exercise or 75 minutes of exercise that takes a lot of effort.  Do not use any products that contain nicotine or tobacco, such as cigarettes, e-cigarettes, and chewing tobacco. If you need help quitting, ask your health care provider.  Do not drink alcohol if: ? Your health care provider tells you not to drink. ? You are pregnant, may be pregnant, or are planning to become pregnant.  If you drink alcohol: ? Limit how much you use to:  0-1 drink a day for women.  0-2 drinks a day for men. ? Be aware of how much alcohol is in your drink. In the [place] one drink equals one 12 oz bottle of beer (355 mL), one 5 oz glass of wine (148 mL), or one 1 oz glass of hard liquor (44 mL).  Do not use drugs.  Manage your stress. Ask your health care provider for stress management tips. General instructions  Take over-the-counter and prescription medicines only as told by your health care provider.  Keep all follow-up visits as told by your health care provider. This is important. Where to find more information  American Heart Association: www.heart.org Get help right away if:  You have any symptoms of a stroke. "BE FAST" is an easy way to remember the main warning signs of a stroke: ? B - Balance. Signs are dizziness, sudden trouble walking, or loss of balance. ? E - Eyes. Signs are trouble seeing or a sudden change in vision. ? F - Face. Signs are  sudden weakness or numbness of the face, or the face or eyelid drooping on one side. ? A - Arms. Signs are weakness or numbness in an arm. This happens suddenly and usually on one side of the body. ? S - Speech. Signs are sudden trouble speaking, slurred speech, or trouble understanding what people say. ? T - Time. Time to call emergency services. Write down what time symptoms started.  You have other signs of a  stroke, such as: ? A sudden, severe headache with no known cause. ? Nausea or vomiting. ? Seizure. These symptoms may represent a serious problem that is an emergency. Do not wait to see if the symptoms will go away. Get medical help right away. Call your local emergency services (911 in the [place].S.). Do not drive yourself to the hospital. Summary  Carotid artery disease, also called carotid artery stenosis, is the narrowing or blockage of one or both carotid arteries.  Carotid artery disease increases your risk for a stroke or a transient ischemic attack (TIA).  This condition can be treated with lifestyle changes, medicines, surgery, or a combination of these treatments.  Get help right away if you have any symptoms of stroke. The acronym BEFAST is an easy way to remember the main warning signs of stroke. This information is not intended to replace advice given to you by your health care provider. Make sure you discuss any questions you have with your health care provider. Document Revised: 06/17/2019 Document Reviewed: 06/17/2019 Elsevier Patient Education  [place].

## 2020-02-08 NOTE — Progress Notes (Signed)
Discharge instructions given to Jay Tran.  Reviewed with spouse.  Discussed new medication, medication changes, and side effects.  Discussed signs and symptoms to watch for and when to contact the physician.  Discussed activities.  Discussed follow up appointments.  Verbalized understanding.

## 2020-02-11 ENCOUNTER — Encounter: Payer: Self-pay | Admitting: *Deleted

## 2020-02-12 NOTE — Progress Notes (Signed)
Virtual Visit via Telephone Note  Post-op right carotid endaterectomy  I connected with Jay Tran on 02/12/2020 using by telephone and verified that I was speaking with the correct person. Patient was located at home and accompanied by wife. I am located at [facility].    Past Medical History:  Diagnosis Date  . Arthritis   . BPH (benign prostatic hypertrophy)   . 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 GERD (gastroesophageal reflux disease)   . Hearing aid worn    Hx: of right ear  . Hyperlipemia   . Hypertension   . Lumbar herniated disc   . Lumbar stenosis   . PONV (postoperative nausea and vomiting)   . Seasonal allergies    Hx: of    Past Surgical History:  Procedure Laterality Date  . COLONOSCOPY     Hx: of  . ENDARTERECTOMY Right 02/07/2020   Procedure: ENDARTERECTOMY CAROTID;  Surgeon: Serafina Mitchell, MD;  Location: [place];  Service: Vascular;  Laterality: Right;  . EYE SURGERY     bil catarcts 02/2016  . HERNIA REPAIR    . LUMBAR LAMINECTOMY  10/26/2012   Procedure: MICRODISCECTOMY LUMBAR LAMINECTOMY;  Surgeon: Marybelle Killings, MD;  Location: [place];  Service: Orthopedics;  Laterality: Right;  Right L4-5 Microdiscectomy  . MAXIMUM ACCESS (MAS)POSTERIOR LUMBAR INTERBODY FUSION (PLIF) 1 LEVEL N/A 04/15/2015   Procedure: Maximum Access Surgery Posterior Lumbar Interbody Fusion Lumbar two-Lumbar three extension of hardware;  Surgeon: Eustace Moore, MD;  Location: [place] [facility];  Service: Neurosurgery;  Laterality: N/A;  . PATCH ANGIOPLASTY Right 02/07/2020   Procedure: PATCH ANGIOPLASTY USING Rueben Bash BIOLOGIC PATCH;  Surgeon: Serafina Mitchell, MD;  Location: [place];  Service: Vascular;  Laterality: Right;  . ROTATOR CUFF REPAIR     right shoulder - 3 times  . TONSILLECTOMY    . TRANSURETHRAL RESECTION OF BLADDER TUMOR N/A 02/24/2017   Procedure: TRANSURETHRAL RESECTION OF BLADDER TUMOR (TURBT);  Surgeon: Alexis Frock, MD;  Location: [facility];   Service: Urology;  Laterality: N/A;    Current Meds  Medication Sig  . cromolyn (OPTICROM) 4 % ophthalmic solution Place 1 drop into both eyes in the morning, at noon, and at bedtime.  . finasteride (PROSCAR) 5 MG tablet Take 5 mg by mouth at bedtime.   . hydrochlorothiazide (HYDRODIURIL) 50 MG tablet Take 50 mg by mouth at bedtime.   . Liniments (SALONPAS PAIN RELIEF PATCH EX) Place 1 patch onto the skin daily as needed (to affected area for pain).   [name] lisinopril (PRINIVIL,ZESTRIL) 20 MG tablet Take 20 mg by mouth at bedtime.   . Multiple Vitamins-Minerals (PRESERVISION AREDS 2 PO) Take 1 capsule by mouth 2 (two) times daily.   . Omega-3 Fatty Acids (FISH OIL) 1200 MG CAPS Take 1,200 mg by mouth daily with breakfast.   . omeprazole (PRILOSEC) 40 MG capsule Take 1 capsule (40 mg total) by mouth daily.  . SYSTANE 0.4-0.3 % SOLN Place 1 drop into both eyes in the morning, at noon, and at bedtime.   . Tamsulosin HCl (FLOMAX) 0.4 MG CAPS Take 0.4 mg by mouth at bedtime.   [name] tiZANidine (ZANAFLEX) 4 MG tablet Take 1 tablet (4 mg total) by mouth every 8 (eight) hours as needed for muscle spasms. (Patient taking differently: Take 4 mg by mouth at bedtime as needed for muscle spasms. )  . trimethoprim-polymyxin b (POLYTRIM) ophthalmic solution Place 1 drop  into the right eye See admin instructions. INSTILL 1 DROP INTO THE RIGHT EYE 4 TIMES DAILY FOR 2 DAYS AFTER EACH MONTHLY EYE INJECTION  . Turmeric (QC TUMERIC COMPLEX PO) Take 1 capsule by mouth daily with breakfast.   . [DISCONTINUED] Chlorphen-Phenyleph-ASA (ALKA-SELTZER PLUS COLD PO) Take 1 capsule by mouth at bedtime.  . [DISCONTINUED] naproxen sodium (ALEVE) 220 MG tablet Take 440 mg by mouth daily with breakfast.     Observations/Objective: The patient is doing well at home.  He states is vision is marginally improved over the last few days.  He states he is able to see outlines of objects from his right eye in bright light.  He states he had a  small amount of bleeding from his incision last night but this has resolved.  He has an appointment with his ophthalmologist in the morning.  I do not yet see an appointment with Dr. Trula Slade under the appointment tab.  I will resend the note to ensure he has a follow-up appointment.  Barbie Banner, PA-C [facility] [place] Office: [phone]  02/12/2020, 2:00 PM

## 2020-02-13 ENCOUNTER — Encounter (INDEPENDENT_AMBULATORY_CARE_PROVIDER_SITE_OTHER): Payer: Medicare Other | Admitting: Ophthalmology

## 2020-02-13 ENCOUNTER — Other Ambulatory Visit: Payer: Self-pay

## 2020-02-13 DIAGNOSIS — H3411 Central retinal artery occlusion, right eye: Secondary | ICD-10-CM | POA: Diagnosis not present

## 2020-02-13 DIAGNOSIS — H353122 Nonexudative age-related macular degeneration, left eye, intermediate dry stage: Secondary | ICD-10-CM

## 2020-02-13 DIAGNOSIS — H35033 Hypertensive retinopathy, bilateral: Secondary | ICD-10-CM

## 2020-02-13 DIAGNOSIS — H43813 Vitreous degeneration, bilateral: Secondary | ICD-10-CM

## 2020-02-13 DIAGNOSIS — H353211 Exudative age-related macular degeneration, right eye, with active choroidal neovascularization: Secondary | ICD-10-CM

## 2020-02-13 DIAGNOSIS — I10 Essential (primary) hypertension: Secondary | ICD-10-CM | POA: Diagnosis not present

## 2020-02-14 NOTE — Addendum Note (Signed)
Addendum  created 02/14/20 0823 by Josephine Igo, CRNA   Clinical Note Signed, Intraprocedure Blocks edited

## 2020-02-20 ENCOUNTER — Telehealth (HOSPITAL_COMMUNITY): Payer: Self-pay

## 2020-02-20 NOTE — Telephone Encounter (Signed)

## 2020-02-24 ENCOUNTER — Encounter: Payer: Self-pay | Admitting: Surgery

## 2020-02-24 ENCOUNTER — Encounter (INDEPENDENT_AMBULATORY_CARE_PROVIDER_SITE_OTHER): Payer: Medicare Other | Admitting: Ophthalmology

## 2020-02-24 ENCOUNTER — Ambulatory Visit (INDEPENDENT_AMBULATORY_CARE_PROVIDER_SITE_OTHER): Payer: Medicare Other | Admitting: Surgery

## 2020-02-24 ENCOUNTER — Other Ambulatory Visit: Payer: Self-pay

## 2020-02-24 VITALS — BP 135/67 | HR 71 | Temp 98.0°F | Resp 20 | Ht 70.0 in | Wt 194.0 lb

## 2020-02-24 DIAGNOSIS — I6521 Occlusion and stenosis of right carotid artery: Secondary | ICD-10-CM

## 2020-02-24 NOTE — Progress Notes (Signed)
Patient name: Jay Tran MRN: [id number] DOB: [birth year] Sex: male  REASON FOR VISIT:     post op  HISTORY OF PRESENT ILLNESS:   Jay Tran is a 86 y.o. male who presented to the hospital with retinal artery occlusion and carotid stenosis.  On 02/07/2020 he underwent right carotid endarterectomy with patch angioplasty.  Intraoperative findings included a 70% stenosis.  This was associated with an ulcer.  He has no complaints from his surgery.  He feels his vision continues to improve.  His biggest issue is his back pain for which he is scheduled to have an injection next week  CURRENT MEDICATIONS:    Current Outpatient Medications  Medication Sig Dispense Refill  . aspirin 81 MG chewable tablet Chew 1 tablet (81 mg total) by mouth daily. 30 tablet 0  . atorvastatin (LIPITOR) 40 MG tablet Take 1 tablet (40 mg total) by mouth daily at 6 PM. 30 tablet 0  . azelastine (ASTELIN) 0.1 % nasal spray Place 2 sprays into both nostrils 2 (two) times daily. Use in each nostril as directed 30 mL 5  . cromolyn (OPTICROM) 4 % ophthalmic solution Place 1 drop into both eyes in the morning, at noon, and at bedtime.    . finasteride (PROSCAR) 5 MG tablet Take 5 mg by mouth at bedtime.     . hydrochlorothiazide (HYDRODIURIL) 50 MG tablet Take 50 mg by mouth at bedtime.     . Liniments (SALONPAS PAIN RELIEF PATCH EX) Place 1 patch onto the skin daily as needed (to affected area for pain).     [name] lisinopril (PRINIVIL,ZESTRIL) 20 MG tablet Take 20 mg by mouth at bedtime.     . Multiple Vitamins-Minerals (PRESERVISION AREDS 2 PO) Take 1 capsule by mouth 2 (two) times daily.     . Omega-3 Fatty Acids (FISH OIL) 1200 MG CAPS Take 1,200 mg by mouth daily with breakfast.     . omeprazole (PRILOSEC) 40 MG capsule Take 1 capsule (40 mg total) by mouth daily. 30 capsule 3  . oxyCODONE-acetaminophen (PERCOCET/ROXICET) 5-325 MG tablet Take 1 tablet by mouth every 6 (six) hours as  needed for moderate pain. 10 tablet 0  . SYSTANE 0.4-0.3 % SOLN Place 1 drop into both eyes in the morning, at noon, and at bedtime.     . Tamsulosin HCl (FLOMAX) 0.4 MG CAPS Take 0.4 mg by mouth at bedtime.     [name] tiZANidine (ZANAFLEX) 4 MG tablet Take 1 tablet (4 mg total) by mouth every 8 (eight) hours as needed for muscle spasms. (Patient taking differently: Take 4 mg by mouth at bedtime as needed for muscle spasms. ) 60 tablet 1  . traMADol (ULTRAM) 50 MG tablet Take 50 mg by mouth every 6 (six) hours as needed (for pain).     [name] trimethoprim-polymyxin b (POLYTRIM) ophthalmic solution Place 1 drop into the right eye See admin instructions. INSTILL 1 DROP INTO THE RIGHT EYE 4 TIMES DAILY FOR 2 DAYS AFTER EACH MONTHLY EYE INJECTION    . Turmeric (QC TUMERIC COMPLEX PO) Take 1 capsule by mouth daily with breakfast.      No current facility-administered medications for this visit.    REVIEW OF SYSTEMS:   [X]  denotes positive finding, [ ]  denotes negative finding Cardiac  Comments:  Chest pain or chest pressure:    Shortness of breath upon exertion:    Short of breath when lying flat:    Irregular heart rhythm:    Constitutional  Fever or chills:      PHYSICAL EXAM:   Vitals:   02/24/20 0849 02/24/20 0853  BP: 126/78 135/67  Pulse: 71   Resp: 20   Temp: 98 F (36.7 C)   SpO2: 94%   Weight: 194 lb (88 kg)   Height: 5\' 10"  (1.778 m)     GENERAL: The patient is a well-nourished male, in no acute distress. The vital signs are documented above. CARDIOVASCULAR: There is a regular rate and rhythm. PULMONARY: Non-labored respirations Right carotid incision is healing nicely without hematoma  STUDIES:   None   MEDICAL ISSUES:   Patient be scheduled for surveillance follow-up duplex in 6-9 months.  Leia Alf, MD, FACS [facility] [phone] Pager [phone]

## 2020-02-25 ENCOUNTER — Other Ambulatory Visit: Payer: Self-pay | Admitting: *Deleted

## 2020-02-25 DIAGNOSIS — I6521 Occlusion and stenosis of right carotid artery: Secondary | ICD-10-CM

## 2020-03-11 ENCOUNTER — Ambulatory Visit (INDEPENDENT_AMBULATORY_CARE_PROVIDER_SITE_OTHER): Payer: Medicare Other | Admitting: Adult Health

## 2020-03-11 ENCOUNTER — Other Ambulatory Visit: Payer: Self-pay

## 2020-03-11 ENCOUNTER — Encounter: Payer: Self-pay | Admitting: Adult Health

## 2020-03-11 VITALS — BP 134/68 | HR 64 | Temp 97.6°F | Ht 70.0 in | Wt 196.0 lb

## 2020-03-11 DIAGNOSIS — I10 Essential (primary) hypertension: Secondary | ICD-10-CM | POA: Diagnosis not present

## 2020-03-11 DIAGNOSIS — E785 Hyperlipidemia, unspecified: Secondary | ICD-10-CM

## 2020-03-11 DIAGNOSIS — Z9889 Other specified postprocedural states: Secondary | ICD-10-CM | POA: Diagnosis not present

## 2020-03-11 DIAGNOSIS — I6521 Occlusion and stenosis of right carotid artery: Secondary | ICD-10-CM | POA: Diagnosis not present

## 2020-03-11 DIAGNOSIS — H3411 Central retinal artery occlusion, right eye: Secondary | ICD-10-CM | POA: Diagnosis not present

## 2020-03-11 MED ORDER — ATORVASTATIN CALCIUM 40 MG PO TABS: 40.0000 mg | ORAL_TABLET | Freq: Every day | ORAL | 3 refills | Status: DC

## 2020-03-11 NOTE — Patient Instructions (Addendum)
Continue aspirin 81 mg daily  and Lipitor for secondary stroke prevention  Continue to follow up with PCP regarding cholesterol and blood pressure management   Continue to follow with Dr. Zigmund Daniel in regards to ongoing visual impairment and retinal artery occlusion  -we will send over the request to obtain recent office notes and findings  Maintain strict control of hypertension with blood pressure goal below 130/90, diabetes with hemoglobin A1c goal below 6.5% and cholesterol with LDL cholesterol (bad cholesterol) goal below 70 mg/dL. I also advised the patient to eat a healthy diet with plenty of whole grains, cereals, fruits and vegetables, exercise regularly and maintain ideal body weight.  Followup in the future with me in 3 months or call earlier if needed       Thank you for coming to see [place] at [facility]. I hope we have been able to provide you high quality care today.  You may receive a patient satisfaction survey over the next few weeks. We would appreciate your feedback and comments so that we may continue to improve ourselves and the health of our patients.

## 2020-03-11 NOTE — Progress Notes (Signed)
[facility] [address] [place]. [place] [address] [phone]       HOSPITAL FOLLOW UP NOTE  [name] Date of Birth:  [birth year] Medical Record Number:  [id number]   Reason for Referral:  hospital stroke follow up    CHIEF COMPLAINT:  Chief Complaint  Patient presents with  . Follow-up    TxRm. With wife. Doing well since hosp visit. states that his vision seems to be coming back in his right eye. Occasionally has blurred vision. States he gets headaches, mostly frontal.     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  History obtained from patient, wife and chart review. Reviewed all radiology images and labs personally.  Jay Tran is a 86 y.o. male with history of lumbar stenosis, hypertension, hyperlipidemia, DVT of lower extremity presented on 02/04/2020 after waking with painless OD vision loss.  Evaluated by stroke team and Dr. 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  He underwent right carotid endarterectomy without complication for symptomatic moderate proximal right carotid stenosis.  Recommended continuation of aspirin 81 mg daily and initiated atorvastatin 40 mg daily.  History of HTN and HLD.  He was discharged home in stable condition with recommendation of home health therapy.  CRAO OD, likely thromboembolic from unstable R ICA stenosis    CT head No acute abnormality. Small vessel disease. Atrophy.     MRI  No acute abnormality. Small vessel disease.   CTA head no significant stenosis  CTA neck proximal R ICA 58% stenosis w/ mixed atherosclerosis   Carotid Doppler  R ICA 40-59% stenosis   2D Echo ejection fraction 60 to 65%.  No cardiac source of embolism.    LDL 128 -initiated atorvastatin 40 mg daily  HgbA1c 5.4  Lovenox 40 mg sq daily for VTE prophylaxis  No  antithrombotic prior to admission, now on aspirin 324 mg daily. Change to aspirin 81 and plavix 75 my daily. Continue DAPT x 3 weeks then aspirin alone    Therapy recommendations:  HH PT and likely HH OT  Disposition:  Return home  [place] for d/c today  Since discharge, he has been stable.  He does report right eye blurred vision started improving approximately 2 to 3 days ago and since that time has been experiencing 15-20 second dull right upper orbital pain and then will have slightly worsened blurred vision for a short duration but denies worsening vision from prior.  He does plan on follow-up visit tomorrow with ophthalmologist Dr. Zigmund Daniel.  He does report initial difficulty with depth perception and ambulation due to likely combination of visual impairment and lower back pain.  He is currently ambulating with cane and denies any recent fall.  It was recommended to participate in home health therapies but unfortunately they had difficulty with obtaining orders therefore therapy was not pursued.  He has continued on aspirin 81 mg daily and atorvastatin for secondary stroke prevention without side effects.  Blood pressure today 134/68.  He did have follow-up with [facility] Dr. Trula Slade who recommended repeat carotid ultrasound in 6 to 9 months.  No further concerns at this time.     ROS:   14 system review of systems performed and negative with exception of blurred vision, gait impairment  PMH:  Past Medical History:  Diagnosis Date  . Arthritis   . BPH (  benign prostatic hypertrophy)   . 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 GERD (gastroesophageal reflux disease)   . Hearing aid worn    Hx: of right ear  . Hyperlipemia   . Hypertension   . Lumbar herniated disc   . Lumbar stenosis   . PONV (postoperative nausea and vomiting)   . Seasonal allergies    Hx: of    PSH:  Past Surgical History:  Procedure Laterality Date  . COLONOSCOPY     Hx: of  .  ENDARTERECTOMY Right 02/07/2020   Procedure: ENDARTERECTOMY CAROTID;  Surgeon: Serafina Mitchell, MD;  Location: [place];  Service: Vascular;  Laterality: Right;  . EYE SURGERY     bil catarcts 02/2016  . HERNIA REPAIR    . LUMBAR LAMINECTOMY  10/26/2012   Procedure: MICRODISCECTOMY LUMBAR LAMINECTOMY;  Surgeon: Marybelle Killings, MD;  Location: [place];  Service: Orthopedics;  Laterality: Right;  Right L4-5 Microdiscectomy  . MAXIMUM ACCESS (MAS)POSTERIOR LUMBAR INTERBODY FUSION (PLIF) 1 LEVEL N/A 04/15/2015   Procedure: Maximum Access Surgery Posterior Lumbar Interbody Fusion Lumbar two-Lumbar three extension of hardware;  Surgeon: Eustace Moore, MD;  Location: [facility];  Service: Neurosurgery;  Laterality: N/A;  . PATCH ANGIOPLASTY Right 02/07/2020   Procedure: PATCH ANGIOPLASTY USING Rueben Bash BIOLOGIC PATCH;  Surgeon: Serafina Mitchell, MD;  Location: [place];  Service: Vascular;  Laterality: Right;  . ROTATOR CUFF REPAIR     right shoulder - 3 times  . TONSILLECTOMY    . TRANSURETHRAL RESECTION OF BLADDER TUMOR N/A 02/24/2017   Procedure: TRANSURETHRAL RESECTION OF BLADDER TUMOR (TURBT);  Surgeon: Alexis Frock, MD;  Location: [facility];  Service: Urology;  Laterality: N/A;    Social History:  Social History   Socioeconomic History  . Marital status: Married    Spouse name: Not on file  . Number of children: Not on file  . Years of education: Not on file  . Highest education level: Not on file  Occupational History  . Not on file  Tobacco Use  . Smoking status: Former Smoker    Quit date: 11/12/1963    Years since quitting: 56.3  . Smokeless tobacco: Never Used  Substance and Sexual Activity  . Alcohol use: No  . Drug use: No  . Sexual activity: Yes  Other Topics Concern  . Not on file  Social History Narrative  . Not on file   Social Determinants of Health   Financial Resource Strain:   . Difficulty of Paying Living Expenses:   Food Insecurity:   . Worried About Sales executive in the Last Year:   . Arboriculturist in the Last Year:   Transportation Needs:   . Film/video editor (Medical):   [name] Lack of Transportation (Non-Medical):   Physical Activity:   . Days of Exercise per Week:   . Minutes of Exercise per Session:   Stress:   . Feeling of Stress :   Social Connections:   . Frequency of Communication with Friends and Family:   . Frequency of Social Gatherings with Friends and Family:   . Attends Religious Services:   . Active Member of Clubs or Organizations:   . Attends Archivist Meetings:   [name] Marital Status:   Intimate Partner Violence:   . Fear of Current or Ex-Partner:   . Emotionally Abused:   [name] Physically Abused:   . Sexually Abused:  Family History:  Family History  Problem Relation Age of Onset  . Stroke Mother   . Cancer - Prostate Father     Medications:   Current Outpatient Medications on File Prior to Visit  Medication Sig Dispense Refill  . cromolyn (OPTICROM) 4 % ophthalmic solution Place 1 drop into both eyes in the morning, at noon, and at bedtime.    . finasteride (PROSCAR) 5 MG tablet Take 5 mg by mouth at bedtime.     . hydrochlorothiazide (HYDRODIURIL) 50 MG tablet Take 50 mg by mouth at bedtime.     . Liniments (SALONPAS PAIN RELIEF PATCH EX) Place 1 patch onto the skin daily as needed (to affected area for pain).     [name] lisinopril (PRINIVIL,ZESTRIL) 20 MG tablet Take 20 mg by mouth at bedtime.     . Multiple Vitamins-Minerals (PRESERVISION AREDS 2 PO) Take 1 capsule by mouth 2 (two) times daily.     . Omega-3 Fatty Acids (FISH OIL) 1200 MG CAPS Take 1,200 mg by mouth daily with breakfast.     . omeprazole (PRILOSEC) 40 MG capsule Take 1 capsule (40 mg total) by mouth daily. 30 capsule 3  . SYSTANE 0.4-0.3 % SOLN Place 1 drop into both eyes in the morning, at noon, and at bedtime.     . Tamsulosin HCl (FLOMAX) 0.4 MG CAPS Take 0.4 mg by mouth at bedtime.     [name] tiZANidine (ZANAFLEX) 4 MG tablet Take 1  tablet (4 mg total) by mouth every 8 (eight) hours as needed for muscle spasms. (Patient taking differently: Take 4 mg by mouth at bedtime as needed for muscle spasms. ) 60 tablet 1  . traMADol (ULTRAM) 50 MG tablet Take 50 mg by mouth every 6 (six) hours as needed (for pain).     [name] trimethoprim-polymyxin b (POLYTRIM) ophthalmic solution Place 1 drop into the right eye See admin instructions. INSTILL 1 DROP INTO THE RIGHT EYE 4 TIMES DAILY FOR 2 DAYS AFTER EACH MONTHLY EYE INJECTION    . Turmeric (QC TUMERIC COMPLEX PO) Take 1 capsule by mouth daily with breakfast.     . atorvastatin (LIPITOR) 40 MG tablet Take 1 tablet (40 mg total) by mouth daily at 6 PM. 30 tablet 0   No current facility-administered medications on file prior to visit.    Allergies:  No Known Allergies   Physical Exam  Vitals:   03/11/20 1306  BP: 134/68  Pulse: 64  Temp: 97.6 F (36.4 C)  Weight: 196 lb (88.9 kg)  Height: 5\' 10"  (1.778 m)   Body mass index is 28.12 kg/m. No exam data present  General: well developed, well nourished, pleasant elderly Caucasian male, seated, in no evident distress Head: head normocephalic and atraumatic.   Neck: supple with no carotid or supraclavicular bruits Cardiovascular: regular rate and rhythm, no murmurs Musculoskeletal: no deformity Skin:  no rash/petichiae Vascular:  Normal pulses all extremities   Neurologic Exam Mental Status: Awake and fully alert.   Normal speech and language.  Oriented to place and time. Recent and remote memory intact. Attention span, concentration and fund of knowledge appropriate. Mood and affect appropriate.  Cranial Nerves: Fundoscopic exam reveals sharp disc margins. Pupils equal, briskly reactive to light. Extraocular movements full without nystagmus. Visual fields full to confrontation with subjective increased blurriness right eye central vision. Hearing intact. Facial sensation intact. Face, tongue, palate moves normally and  symmetrically.  Motor: Normal bulk and tone. Normal strength in all tested extremity muscles.  Sensory.: intact to touch , pinprick , position and vibratory sensation.  Coordination: Rapid alternating movements normal in all extremities. Finger-to-nose and heel-to-shin performed accurately bilaterally. Gait and Station: Arises from chair without difficulty. Stance is normal. Gait demonstrates normal stride length and balance Reflexes: 1+ and symmetric. Toes downgoing.     NIHSS  0 Modified Rankin  1      ASSESSMENT: Jay Tran is a 86 y.o. year old male presented with painless OD vision loss on 02/04/2020 with CRAO OD likely thromboembolic, unstable right ICA stenosis s/p right carotid endarterectomy. Vascular risk factors include HTN, HLD and history of provoked RLE DVT.  Continues to experience right eyes centrally blurred vision but overall greatly improving    PLAN:  1. CRAO OD :  a. C/o R orbital quick dull pain when slightly worsening vision over the past 3 days.  Advised him to follow-up with his ophthalmologist with already scheduled visit tomorrow but with any severe worsening or severe headache to call 911 for further evaluation b. Continue aspirin 81 mg daily  and atorvastatin for secondary stroke prevention.  c. Maintain strict control of hypertension with blood pressure goal below 130/90, diabetes with hemoglobin A1c goal below 6.5% and cholesterol with LDL cholesterol (bad cholesterol) goal below 70 mg/dL.  I also advised the patient to eat a healthy diet with plenty of whole grains, cereals, fruits and vegetables, exercise regularly with at least 30 minutes of continuous activity daily and maintain ideal body weight. 2. Right ICA stenosis s/p CEA: Continue aspirin and statin and follow-up with Dr. Trula Slade for repeat carotid ultrasound in 6 to 9 months 3. HTN: a. Continue current treatment plan and ongoing follow-up with PCP 4. HLD:  a. Continue current treatment plan and  ongoing follow-up with PCP b. Atorvastatin refill provided per request but advised ongoing prescribing by PCP     Follow up in 3 months or call earlier if needed   Greater than 50% of time during this 45 minute visit was spent on counseling, explanation of diagnosis of CRAO OD, reviewing risk factor management of right ICA stenosis status post CEA, HTN and HLD, planning of further management along with potential future management, and discussion with patient and family answering all questions.    Frann Rider, AGNP-BC  [facility] [address] [place], [place] [address]  Phone [phone] Fax [phone] Note: This document was prepared with digital dictation and possible smart phrase technology. Any transcriptional errors that result from this process are unintentional.

## 2020-03-12 ENCOUNTER — Other Ambulatory Visit: Payer: Self-pay

## 2020-03-12 ENCOUNTER — Encounter (INDEPENDENT_AMBULATORY_CARE_PROVIDER_SITE_OTHER): Payer: Medicare Other | Admitting: Ophthalmology

## 2020-03-12 DIAGNOSIS — H3411 Central retinal artery occlusion, right eye: Secondary | ICD-10-CM | POA: Diagnosis not present

## 2020-03-12 DIAGNOSIS — I10 Essential (primary) hypertension: Secondary | ICD-10-CM

## 2020-03-12 DIAGNOSIS — H353122 Nonexudative age-related macular degeneration, left eye, intermediate dry stage: Secondary | ICD-10-CM

## 2020-03-12 DIAGNOSIS — H35033 Hypertensive retinopathy, bilateral: Secondary | ICD-10-CM

## 2020-03-12 DIAGNOSIS — H353211 Exudative age-related macular degeneration, right eye, with active choroidal neovascularization: Secondary | ICD-10-CM

## 2020-03-12 DIAGNOSIS — H43813 Vitreous degeneration, bilateral: Secondary | ICD-10-CM

## 2020-03-20 NOTE — Progress Notes (Signed)
I agree with the above plan 

## 2020-04-15 ENCOUNTER — Encounter (INDEPENDENT_AMBULATORY_CARE_PROVIDER_SITE_OTHER): Payer: Self-pay

## 2020-04-15 NOTE — Progress Notes (Unsigned)
Faxed Rx refill into [facility] [address] [place] on 04/13//2021. Omeprazole 40 mg caps. QTY. 30 w/ 2 refills. Take 1 caps before dinner QD.

## 2020-05-12 ENCOUNTER — Encounter: Payer: Self-pay | Admitting: Adult Health

## 2020-05-12 ENCOUNTER — Encounter (INDEPENDENT_AMBULATORY_CARE_PROVIDER_SITE_OTHER): Payer: Medicare Other | Admitting: Ophthalmology

## 2020-05-12 ENCOUNTER — Other Ambulatory Visit: Payer: Self-pay

## 2020-05-12 DIAGNOSIS — H353122 Nonexudative age-related macular degeneration, left eye, intermediate dry stage: Secondary | ICD-10-CM

## 2020-05-12 DIAGNOSIS — H3411 Central retinal artery occlusion, right eye: Secondary | ICD-10-CM

## 2020-05-12 DIAGNOSIS — I10 Essential (primary) hypertension: Secondary | ICD-10-CM | POA: Diagnosis not present

## 2020-05-12 DIAGNOSIS — H35033 Hypertensive retinopathy, bilateral: Secondary | ICD-10-CM

## 2020-05-12 DIAGNOSIS — H43813 Vitreous degeneration, bilateral: Secondary | ICD-10-CM

## 2020-05-12 DIAGNOSIS — H353211 Exudative age-related macular degeneration, right eye, with active choroidal neovascularization: Secondary | ICD-10-CM

## 2020-06-18 ENCOUNTER — Ambulatory Visit: Payer: Medicare Other | Admitting: Adult Health

## 2020-07-02 ENCOUNTER — Other Ambulatory Visit (INDEPENDENT_AMBULATORY_CARE_PROVIDER_SITE_OTHER): Payer: Self-pay | Admitting: Otolaryngology

## 2020-07-02 DIAGNOSIS — K219 Gastro-esophageal reflux disease without esophagitis: Secondary | ICD-10-CM

## 2020-07-07 ENCOUNTER — Encounter (INDEPENDENT_AMBULATORY_CARE_PROVIDER_SITE_OTHER): Payer: Medicare Other | Admitting: Ophthalmology

## 2020-07-07 ENCOUNTER — Other Ambulatory Visit: Payer: Self-pay

## 2020-07-07 DIAGNOSIS — H43813 Vitreous degeneration, bilateral: Secondary | ICD-10-CM

## 2020-07-07 DIAGNOSIS — H353211 Exudative age-related macular degeneration, right eye, with active choroidal neovascularization: Secondary | ICD-10-CM | POA: Diagnosis not present

## 2020-07-07 DIAGNOSIS — H35033 Hypertensive retinopathy, bilateral: Secondary | ICD-10-CM

## 2020-07-07 DIAGNOSIS — H3411 Central retinal artery occlusion, right eye: Secondary | ICD-10-CM

## 2020-07-07 DIAGNOSIS — H353122 Nonexudative age-related macular degeneration, left eye, intermediate dry stage: Secondary | ICD-10-CM | POA: Diagnosis not present

## 2020-07-07 DIAGNOSIS — I10 Essential (primary) hypertension: Secondary | ICD-10-CM | POA: Diagnosis not present

## 2020-07-08 ENCOUNTER — Other Ambulatory Visit (INDEPENDENT_AMBULATORY_CARE_PROVIDER_SITE_OTHER): Payer: Self-pay

## 2020-07-08 MED ORDER — OMEPRAZOLE 40 MG PO CPDR: 40.0000 mg | DELAYED_RELEASE_CAPSULE | Freq: Every day | ORAL | 3 refills | Status: DC

## 2020-07-27 ENCOUNTER — Encounter: Payer: Self-pay | Admitting: Adult Health

## 2020-07-27 ENCOUNTER — Other Ambulatory Visit: Payer: Self-pay

## 2020-07-27 ENCOUNTER — Ambulatory Visit (INDEPENDENT_AMBULATORY_CARE_PROVIDER_SITE_OTHER): Payer: Medicare Other | Admitting: Adult Health

## 2020-07-27 VITALS — BP 130/82 | HR 62 | Ht 70.0 in | Wt 196.4 lb

## 2020-07-27 DIAGNOSIS — H3411 Central retinal artery occlusion, right eye: Secondary | ICD-10-CM

## 2020-07-27 DIAGNOSIS — I6521 Occlusion and stenosis of right carotid artery: Secondary | ICD-10-CM | POA: Diagnosis not present

## 2020-07-27 DIAGNOSIS — Z9889 Other specified postprocedural states: Secondary | ICD-10-CM

## 2020-07-27 DIAGNOSIS — E785 Hyperlipidemia, unspecified: Secondary | ICD-10-CM

## 2020-07-27 DIAGNOSIS — I10 Essential (primary) hypertension: Secondary | ICD-10-CM

## 2020-07-27 NOTE — Progress Notes (Signed)
[facility] [address] [place]. [place] [address] [phone]       STROKE FOLLOW UP NOTE  [name] Date of Birth:  09/20/1934 Medical Record Number:  [id number]   Reason for Referral: stroke follow up    CHIEF COMPLAINT:  Chief Complaint  Patient presents with  . Follow-up    3-4 month f/u   . room 9    with wife     HPI:  Today, 07/27/2020, Jay Tran returns for follow-up regarding OD CRAO in 01/2020 accompanied by his wife.    Residual deficits right eye visual impairment.  Denies worsening.  Continues to follow closely with ophthalmology currently receiving injections in right eye for macular degeneration.  Per wife, ophthalmologist reported improvement of vision post CRAO therefore cleared to restart injections.  Continues ADLs independently but unable to drive due to visual impairment  Remains on aspirin 81 mg daily and atorvastatin without side effects currently managed by PCP.  Blood pressure today 130/82.   Denies new or worsening stroke/TIA symptoms or additional visual symptoms.     History provided for reference purposes only Initial visit 03/11/2020 JM: Since discharge, he has been stable.  He does report right eye blurred vision started improving approximately 2 to 3 days ago and since that time has been experiencing 15-20 second dull right upper orbital pain and then will have slightly worsened blurred vision for a short duration but denies worsening vision from prior.  He does plan on follow-up visit tomorrow with ophthalmologist Dr. Zigmund Daniel.  He does report initial difficulty with depth perception and ambulation due to likely combination of visual impairment and lower back pain.  He is currently ambulating with cane and denies any recent fall.  It was recommended to participate in home health therapies but unfortunately they had difficulty with obtaining orders therefore therapy was not pursued.  He has continued on aspirin 81 mg daily  and atorvastatin for secondary stroke prevention without side effects.  Blood pressure today 134/68.  He did have follow-up with [facility] Dr. Trula Slade who recommended repeat carotid ultrasound in 6 to 9 months.  No further concerns at this time.  Hospital admission summary 02/04/2020:  Jay Tran is a 86 y.o. male with history of lumbar stenosis, hypertension, hyperlipidemia, DVT of lower extremity presented on 02/04/2020 after waking with painless OD vision loss.  Evaluated by stroke team and Dr. 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  He underwent right carotid endarterectomy without complication for symptomatic moderate proximal right carotid stenosis.  Recommended continuation of aspirin 81 mg daily and initiated atorvastatin 40 mg daily.  History of HTN and HLD.  He was discharged home in stable condition with recommendation of home health therapy.  CRAO OD, likely thromboembolic from unstable R ICA stenosis    CT head No acute abnormality. Small vessel disease. Atrophy.     MRI  No acute abnormality. Small vessel disease.   CTA head no significant stenosis  CTA neck proximal R ICA 58% stenosis w/ mixed atherosclerosis   Carotid Doppler  R ICA 40-59% stenosis   2D Echo ejection fraction 60 to 65%.  No cardiac source of embolism.    LDL 128 -initiated atorvastatin 40 mg daily  HgbA1c 5.4  Lovenox 40 mg sq daily for VTE prophylaxis  No antithrombotic prior to admission, now on aspirin 324 mg daily. Change to aspirin 81 and plavix 75 my daily. Continue DAPT x 3  weeks then aspirin alone    Therapy recommendations:  HH PT and likely HH OT  Disposition:  Return home  [place] for d/c today      ROS:   14 system review of systems performed and negative with exception of blurred vision  PMH:  Past Medical History:  Diagnosis Date  . Arthritis   . BPH (benign prostatic hypertrophy)   . 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  (HCC)    Hx: of  . GERD (gastroesophageal reflux disease)   . Hearing aid worn    Hx: of right ear  . Hyperlipemia   . Hypertension   . Lumbar herniated disc   . Lumbar stenosis   . PONV (postoperative nausea and vomiting)   . Seasonal allergies    Hx: of    PSH:  Past Surgical History:  Procedure Laterality Date  . COLONOSCOPY     Hx: of  . ENDARTERECTOMY Right 02/07/2020   Procedure: ENDARTERECTOMY CAROTID;  Surgeon: Serafina Mitchell, MD;  Location: [place];  Service: Vascular;  Laterality: Right;  . EYE SURGERY     bil catarcts 02/2016  . HERNIA REPAIR    . LUMBAR LAMINECTOMY  10/26/2012   Procedure: MICRODISCECTOMY LUMBAR LAMINECTOMY;  Surgeon: Marybelle Killings, MD;  Location: [facility];  Service: Orthopedics;  Laterality: Right;  Right L4-5 Microdiscectomy  . MAXIMUM ACCESS (MAS)POSTERIOR LUMBAR INTERBODY FUSION (PLIF) 1 LEVEL N/A 04/15/2015   Procedure: Maximum Access Surgery Posterior Lumbar Interbody Fusion Lumbar two-Lumbar three extension of hardware;  Surgeon: Eustace Moore, MD;  Location: [facility];  Service: Neurosurgery;  Laterality: N/A;  . PATCH ANGIOPLASTY Right 02/07/2020   Procedure: PATCH ANGIOPLASTY USING Rueben Bash BIOLOGIC PATCH;  Surgeon: Serafina Mitchell, MD;  Location: [facility];  Service: Vascular;  Laterality: Right;  . ROTATOR CUFF REPAIR     right shoulder - 3 times  . TONSILLECTOMY    . TRANSURETHRAL RESECTION OF BLADDER TUMOR N/A 02/24/2017   Procedure: TRANSURETHRAL RESECTION OF BLADDER TUMOR (TURBT);  Surgeon: Alexis Frock, MD;  Location: [facility];  Service: Urology;  Laterality: N/A;    Social History:  Social History   Socioeconomic History  . Marital status: Married    Spouse name: Not on file  . Number of children: Not on file  . Years of education: Not on file  . Highest education level: Not on file  Occupational History  . Not on file  Tobacco Use  . Smoking status: Former Smoker    Quit date: 11/12/1963    Years since quitting: 56.7  .  Smokeless tobacco: Never Used  Vaping Use  . Vaping Use: Never used  Substance and Sexual Activity  . Alcohol use: No  . Drug use: No  . Sexual activity: Yes  Other Topics Concern  . Not on file  Social History Narrative  . Not on file   Social Determinants of Health   Financial Resource Strain:   . Difficulty of Paying Living Expenses:   Food Insecurity:   . Worried About Charity fundraiser in the Last Year:   . Arboriculturist in the Last Year:   Transportation Needs:   . Film/video editor (Medical):   [name] Lack of Transportation (Non-Medical):   Physical Activity:   . Days of Exercise per Week:   . Minutes of Exercise per Session:   Stress:   . Feeling of Stress :   Social Connections:   . Frequency of  Communication with Friends and Family:   . Frequency of Social Gatherings with Friends and Family:   . Attends Religious Services:   . Active Member of Clubs or Organizations:   . Attends Archivist Meetings:   [name] Marital Status:   Intimate Partner Violence:   . Fear of Current or Ex-Partner:   . Emotionally Abused:   [name] Physically Abused:   . Sexually Abused:     Family History:  Family History  Problem Relation Age of Onset  . Stroke Mother   . Cancer - Prostate Father     Medications:   Current Outpatient Medications on File Prior to Visit  Medication Sig Dispense Refill  . aspirin EC 81 MG tablet Take 81 mg by mouth daily.    [name] atorvastatin (LIPITOR) 40 MG tablet Take 1 tablet (40 mg total) by mouth daily at 6 PM. 90 tablet 3  . cromolyn (OPTICROM) 4 % ophthalmic solution Place 1 drop into both eyes in the morning, at noon, and at bedtime.    . finasteride (PROSCAR) 5 MG tablet Take 5 mg by mouth at bedtime.     . hydrochlorothiazide (HYDRODIURIL) 50 MG tablet Take 50 mg by mouth at bedtime.     . Liniments (SALONPAS PAIN RELIEF PATCH EX) Place 1 patch onto the skin daily as needed (to affected area for pain).     [name] lisinopril  (PRINIVIL,ZESTRIL) 20 MG tablet Take 20 mg by mouth at bedtime.     . Multiple Vitamins-Minerals (PRESERVISION AREDS 2 PO) Take 1 capsule by mouth 2 (two) times daily.     . Omega-3 Fatty Acids (FISH OIL) 1200 MG CAPS Take 1,200 mg by mouth daily with breakfast.     . omeprazole (PRILOSEC) 40 MG capsule Take 1 capsule (40 mg total) by mouth daily. 30 capsule 3  . omeprazole (PRILOSEC) 40 MG capsule Take 1 capsule (40 mg total) by mouth daily. 30 capsule 3  . SYSTANE 0.4-0.3 % SOLN Place 1 drop into both eyes in the morning, at noon, and at bedtime.     . Tamsulosin HCl (FLOMAX) 0.4 MG CAPS Take 0.4 mg by mouth at bedtime.     [name] tiZANidine (ZANAFLEX) 4 MG tablet Take 1 tablet (4 mg total) by mouth every 8 (eight) hours as needed for muscle spasms. (Patient taking differently: Take 4 mg by mouth at bedtime as needed for muscle spasms. ) 60 tablet 1  . traMADol (ULTRAM) 50 MG tablet Take 50 mg by mouth every 6 (six) hours as needed (for pain).     [name] trimethoprim-polymyxin b (POLYTRIM) ophthalmic solution Place 1 drop into the right eye See admin instructions. INSTILL 1 DROP INTO THE RIGHT EYE 4 TIMES DAILY FOR 2 DAYS AFTER EACH MONTHLY EYE INJECTION    . Turmeric (QC TUMERIC COMPLEX PO) Take 1 capsule by mouth daily with breakfast.      No current facility-administered medications on file prior to visit.    Allergies:  No Known Allergies   Physical Exam  Vitals:   07/27/20 0908  BP: 130/82  Pulse: 62  Weight: 196 lb 6.4 oz (89.1 kg)  Height: 5\' 10"  (1.778 m)   Body mass index is 28.18 kg/m. No exam data present  General: well developed, well nourished, pleasant elderly Caucasian male, seated, in no evident distress Neck: supple with no carotid or supraclavicular bruits Cardiovascular: regular rate and rhythm, no murmurs Vascular:  Normal pulses all extremities   Neurologic Exam Mental Status:  Awake and fully alert.   Fluent speech and language.  Oriented to place and time. Recent  and remote memory intact. Attention span, concentration and fund of knowledge appropriate. Mood and affect appropriate.  Cranial Nerves: Pupils equal, briskly reactive to light. Extraocular movements full without nystagmus. Visual fields full to confrontation with limited central vision right eye.  HOH bilaterally. Facial sensation intact. Face, tongue, palate moves normally and symmetrically.  Motor: Normal bulk and tone. Normal strength in all tested extremity muscles. Sensory.: intact to touch , pinprick , position and vibratory sensation.  Coordination: Rapid alternating movements normal in all extremities. Finger-to-nose and heel-to-shin performed accurately bilaterally. Gait and Station: Arises from chair without difficulty. Stance is normal. Gait demonstrates normal stride length and balance Reflexes: 1+ and symmetric. Toes downgoing.        ASSESSMENT/PLAN: Jay Tran is a 86 y.o. year old male presented with painless OD vision loss on 02/04/2020 with CRAO OD likely thromboembolic, unstable right ICA stenosis s/p right carotid endarterectomy. Vascular risk factors include HTN, HLD and history of provoked RLE DVT.      1. CRAO OD :  a. Residual deficits: OD central blurred vision b. Ongoing follow-up with ophthalmology with next scheduled follow-up visit 9/13 and request office notes to be faxed to office for further review c. Continue aspirin 81 mg daily  and atorvastatin for secondary stroke prevention.  d. Advise close PCP follow-up for aggressive stroke risk factor management 2. Right ICA stenosis s/p CEA:  a. Continue aspirin and statin and follow-up with Dr. Trula Slade for repeat carotid ultrasound next month 3. HTN: a. BP goal<130/90 b. Stable c. Continue current treatment plan and ongoing follow-up with PCP 4. HLD:  a. LDL goal<70 b. Continue current treatment plan and ongoing follow-up with PCP c. Atorvastatin refill provided per request but advised ongoing prescribing  by PCP   Follow-up in 6 months or call earlier if needed   I spent 30 minutes of face-to-face and non-face-to-face time with patient and wife.  This included previsit chart review, lab review, study review, order entry, electronic health record documentation, patient education regarding history of OD CRAO, right ICA stenosis s/p CEA, residual deficits and macular degeneration, importance of managing stroke risk factors and answered all questions to patient and wife's satisfaction    Frann Rider, AGNP-BC  [facility] [address] [place], [place] [address]  Phone [phone] Fax [phone] Note: This document was prepared with digital dictation and possible smart phrase technology. Any transcriptional errors that result from this process are unintentional.

## 2020-07-27 NOTE — Progress Notes (Signed)
I agree with the above plan 

## 2020-07-27 NOTE — Patient Instructions (Addendum)
Continue aspirin 81 mg daily  and atorvastatin for secondary stroke prevention  Continue to follow-up with vascular surgery with repeat carotid ultrasound in the near future for right carotid stenosis  Continue to follow with ophthalmology as recommended  Continue to follow up with PCP regarding cholesterol and blood pressure management  Maintain strict control of hypertension with blood pressure goal below 130/90, diabetes with hemoglobin A1c goal below 6.5% and cholesterol with LDL cholesterol (bad cholesterol) goal below 70 mg/dL.    Follow-up in 6 months or call earlier if needed     Thank you for coming to see [place] at [facility]. I hope we have been able to provide you high quality care today.  You may receive a patient satisfaction survey over the next few weeks. We would appreciate your feedback and comments so that we may continue to improve ourselves and the health of our patients.

## 2020-08-26 ENCOUNTER — Encounter (INDEPENDENT_AMBULATORY_CARE_PROVIDER_SITE_OTHER): Payer: Self-pay

## 2020-08-26 NOTE — Progress Notes (Unsigned)
On 07/07/2020 faxed Rx request to [facility] on [address] for Prilosec 40 mg. This had 2 added refills. Take 1 caps daily before dinner.

## 2020-08-31 ENCOUNTER — Encounter (INDEPENDENT_AMBULATORY_CARE_PROVIDER_SITE_OTHER): Payer: Medicare Other | Admitting: Ophthalmology

## 2020-08-31 ENCOUNTER — Other Ambulatory Visit: Payer: Self-pay

## 2020-08-31 DIAGNOSIS — H35033 Hypertensive retinopathy, bilateral: Secondary | ICD-10-CM

## 2020-08-31 DIAGNOSIS — I10 Essential (primary) hypertension: Secondary | ICD-10-CM | POA: Diagnosis not present

## 2020-08-31 DIAGNOSIS — H353211 Exudative age-related macular degeneration, right eye, with active choroidal neovascularization: Secondary | ICD-10-CM

## 2020-08-31 DIAGNOSIS — H353122 Nonexudative age-related macular degeneration, left eye, intermediate dry stage: Secondary | ICD-10-CM | POA: Diagnosis not present

## 2020-08-31 DIAGNOSIS — H3411 Central retinal artery occlusion, right eye: Secondary | ICD-10-CM | POA: Diagnosis not present

## 2020-08-31 DIAGNOSIS — H43813 Vitreous degeneration, bilateral: Secondary | ICD-10-CM

## 2020-09-17 ENCOUNTER — Other Ambulatory Visit: Payer: Self-pay | Admitting: Urology

## 2020-10-30 ENCOUNTER — Other Ambulatory Visit (INDEPENDENT_AMBULATORY_CARE_PROVIDER_SITE_OTHER): Payer: Self-pay | Admitting: Otolaryngology

## 2020-11-06 ENCOUNTER — Encounter (INDEPENDENT_AMBULATORY_CARE_PROVIDER_SITE_OTHER): Payer: Medicare Other | Admitting: Ophthalmology

## 2020-11-06 ENCOUNTER — Other Ambulatory Visit: Payer: Self-pay

## 2020-11-06 DIAGNOSIS — H353211 Exudative age-related macular degeneration, right eye, with active choroidal neovascularization: Secondary | ICD-10-CM | POA: Diagnosis not present

## 2020-11-06 DIAGNOSIS — H353122 Nonexudative age-related macular degeneration, left eye, intermediate dry stage: Secondary | ICD-10-CM | POA: Diagnosis not present

## 2020-11-06 DIAGNOSIS — I10 Essential (primary) hypertension: Secondary | ICD-10-CM | POA: Diagnosis not present

## 2020-11-06 DIAGNOSIS — H35033 Hypertensive retinopathy, bilateral: Secondary | ICD-10-CM

## 2020-11-06 DIAGNOSIS — H3411 Central retinal artery occlusion, right eye: Secondary | ICD-10-CM | POA: Diagnosis not present

## 2020-11-06 DIAGNOSIS — H43813 Vitreous degeneration, bilateral: Secondary | ICD-10-CM

## 2020-11-10 ENCOUNTER — Other Ambulatory Visit: Payer: Self-pay

## 2020-11-10 ENCOUNTER — Encounter (HOSPITAL_BASED_OUTPATIENT_CLINIC_OR_DEPARTMENT_OTHER): Payer: Self-pay | Admitting: Urology

## 2020-11-10 NOTE — Progress Notes (Addendum)
Spoke w/ via phone for pre-op interview---pt wife Jay Tran per pt request pt hoh Lab needs dos----I stat 8       (gent)        COVID test ------11-18-2020 1415 Arrive at -------830 am 11-20-2020 NPO after MN NO Solid Food.  Clear liquids from MN until---730 am then npo Medications to take morning of surgery -----eye drop, omeprazole, ondansetron prn Diabetic medication -----n/a Patient Special Instructions -----pt wife given overnight stay instructions Pre-Op special Istructions -----none Patient verbalized understanding of instructions that were given at this phone interview. Patient denies shortness of breath, chest pain, fever, cough at this phone interview.  Anesthesia Review:no  PCP:dr wilson elkins Cardiologist :none Vascular dr Blossom Hoops 02-24-2020 Jay Tran mccue pa 07-27-2020 epic Ct angio neck 02-04-2020 epic Vas [place] catoid 02-05-2020  Chest x-ray :none EKG :08-02-2018 epic Echo :02-05-2020 epic Stress test:none Cardiac Cath : none Activity level: does housework and can climb steps without problenms Sleep Study/ CPAP :n/a Fasting Blood Sugar :      / Checks Blood Sugar -- times a day:  n/a Blood Thinner/ Instructions /Last Dose:n/a ASA / Instructions/ Last Dose : wife called dr Trula Slade nurse and received instructions to stop 81 mg aspirin 5 days before surgery  Pt hoh wife Jay Tran will need to go to pre op to help with last time medications taken

## 2020-11-18 ENCOUNTER — Other Ambulatory Visit (HOSPITAL_COMMUNITY)
Admission: RE | Admit: 2020-11-18 | Discharge: 2020-11-18 | Disposition: A | Discharge status: Home / Self Care | Payer: Medicare Other | Source: Ambulatory Visit | Attending: Urology | Admitting: Urology

## 2020-11-18 DIAGNOSIS — Z01812 Encounter for preprocedural laboratory examination: Secondary | ICD-10-CM | POA: Insufficient documentation

## 2020-11-18 DIAGNOSIS — Z20822 Contact with and (suspected) exposure to covid-19: Secondary | ICD-10-CM | POA: Insufficient documentation

## 2020-11-18 LAB — SARS CORONAVIRUS 2 (TAT 6-24 HRS): SARS Coronavirus 2: NEGATIVE

## 2020-11-19 NOTE — Anesthesia Preprocedure Evaluation (Addendum)
Anesthesia Evaluation  Patient identified by MRN, date of birth, ID band Patient awake    Reviewed: Allergy & Precautions, NPO status , Patient's Chart, lab work & pertinent test results  History of Anesthesia Complications (+) PONV  Airway Mallampati: II  TM Distance: >3 FB Neck ROM: Full    Dental no notable dental hx. (+) Teeth Intact, Dental Advisory Given   Pulmonary neg pulmonary ROS, former smoker,    Pulmonary exam normal breath sounds clear to auscultation       Cardiovascular hypertension, Pt. on medications Normal cardiovascular exam Rhythm:Regular Rate:Normal  02/05/20 Echo 1. Left ventricular ejection fraction, by estimation, is 60 to 65%. The  left ventricle has normal function. The left ventricle has no regional  Santone motion abnormalities. The left ventricular internal cavity size was  mildly dilated. There is mild  concentric left ventricular hypertrophy. Left ventricular diastolic  parameters are indeterminate.    Neuro/Psych negative neurological ROS  negative psych ROS   GI/Hepatic Neg liver ROS, GERD  ,  Endo/Other  negative endocrine ROS  Renal/GU      Musculoskeletal  (+) Arthritis ,   Abdominal   Peds  Hematology   Anesthesia Other Findings Hard of Hearing  Reproductive/Obstetrics                            Anesthesia Physical Anesthesia Plan  ASA: II  Anesthesia Plan: General   Post-op Pain Management:    Induction: Intravenous  PONV Risk Score and Plan: 3 and Treatment may vary due to age or medical condition and Ondansetron  Airway Management Planned: LMA  Additional Equipment: None  Intra-op Plan:   Post-operative Plan:   Informed Consent: I have reviewed the patients History and Physical, chart, labs and discussed the procedure including the risks, benefits and alternatives for the proposed anesthesia with the patient or authorized  representative who has indicated his/her understanding and acceptance.     Dental advisory given  Plan Discussed with: CRNA and Anesthesiologist  Anesthesia Plan Comments:        Anesthesia Quick Evaluation

## 2020-11-20 ENCOUNTER — Other Ambulatory Visit: Payer: Self-pay

## 2020-11-20 ENCOUNTER — Encounter (HOSPITAL_BASED_OUTPATIENT_CLINIC_OR_DEPARTMENT_OTHER)
Admission: RE | Disposition: A | Discharge status: Home / Self Care | Payer: Self-pay | Source: Home / Self Care | Attending: Urology

## 2020-11-20 ENCOUNTER — Ambulatory Visit (HOSPITAL_BASED_OUTPATIENT_CLINIC_OR_DEPARTMENT_OTHER): Payer: Medicare Other | Admitting: Anesthesiology

## 2020-11-20 ENCOUNTER — Ambulatory Visit (HOSPITAL_COMMUNITY)
Admission: RE | Admit: 2020-11-20 | Discharge: 2020-11-21 | Disposition: A | Discharge status: Home / Self Care | Payer: Medicare Other | Attending: Urology | Admitting: Urology

## 2020-11-20 ENCOUNTER — Encounter (HOSPITAL_BASED_OUTPATIENT_CLINIC_OR_DEPARTMENT_OTHER): Payer: Self-pay | Admitting: Urology

## 2020-11-20 DIAGNOSIS — N329 Bladder disorder, unspecified: Secondary | ICD-10-CM | POA: Insufficient documentation

## 2020-11-20 DIAGNOSIS — Z87891 Personal history of nicotine dependence: Secondary | ICD-10-CM | POA: Insufficient documentation

## 2020-11-20 DIAGNOSIS — R338 Other retention of urine: Secondary | ICD-10-CM | POA: Insufficient documentation

## 2020-11-20 DIAGNOSIS — N401 Enlarged prostate with lower urinary tract symptoms: Secondary | ICD-10-CM | POA: Diagnosis present

## 2020-11-20 DIAGNOSIS — N400 Enlarged prostate without lower urinary tract symptoms: Secondary | ICD-10-CM | POA: Insufficient documentation

## 2020-11-20 HISTORY — PX: TRANSURETHRAL RESECTION OF PROSTATE: SHX73

## 2020-11-20 HISTORY — DX: Unspecified macular degeneration: H35.30

## 2020-11-20 HISTORY — DX: Occlusion and stenosis of unspecified carotid artery: I65.29

## 2020-11-20 LAB — POCT I-STAT, CHEM 8
BUN: 19 mg/dL (ref 8–23)
Calcium, Ion: 1.09 mmol/L — ABNORMAL LOW (ref 1.15–1.40)
Chloride: 95 mmol/L — ABNORMAL LOW (ref 98–111)
Creatinine, Ser: 1.2 mg/dL (ref 0.61–1.24)
Glucose, Bld: 101 mg/dL — ABNORMAL HIGH (ref 70–99)
HCT: 42 % (ref 39.0–52.0)
Hemoglobin: 14.3 g/dL (ref 13.0–17.0)
Potassium: 3.7 mmol/L (ref 3.5–5.1)
Sodium: 132 mmol/L — ABNORMAL LOW (ref 135–145)
TCO2: 23 mmol/L (ref 22–32)

## 2020-11-20 LAB — HEMOGLOBIN AND HEMATOCRIT, BLOOD
HCT: 38.9 % — ABNORMAL LOW (ref 39.0–52.0)
Hemoglobin: 13.4 g/dL (ref 13.0–17.0)

## 2020-11-20 SURGERY — TURP (TRANSURETHRAL RESECTION OF PROSTATE)
Anesthesia: General | Site: Prostate

## 2020-11-20 MED ORDER — SENNOSIDES-DOCUSATE SODIUM 8.6-50 MG PO TABS
1.0000 | ORAL_TABLET | Freq: Two times a day (BID) | ORAL | Status: DC
  Administered 2020-11-20: 1 via ORAL
  Filled 2020-11-20: qty 1

## 2020-11-20 MED ORDER — TRAMADOL HCL 50 MG PO TABS
50.0000 mg | ORAL_TABLET | Freq: Four times a day (QID) | ORAL | 0 refills | Status: DC | PRN
Start: 2020-11-20 — End: 2022-01-26

## 2020-11-20 MED ORDER — OXYCODONE HCL 5 MG PO TABS: 5.0000 mg | ORAL_TABLET | ORAL | Status: DC | PRN

## 2020-11-20 MED ORDER — POLYETHYL GLYCOL-PROPYL GLYCOL 0.4-0.3 % OP SOLN: 1.0000 [drp] | Freq: Three times a day (TID) | OPHTHALMIC | Status: DC

## 2020-11-20 MED ORDER — FINASTERIDE 5 MG PO TABS
5.0000 mg | ORAL_TABLET | Freq: Every day | ORAL | Status: DC
  Administered 2020-11-20: 5 mg via ORAL
  Filled 2020-11-20: qty 1

## 2020-11-20 MED ORDER — LIDOCAINE HCL (PF) 2 % IJ SOLN
INTRAMUSCULAR | Status: AC
  Filled 2020-11-20: qty 5

## 2020-11-20 MED ORDER — FENTANYL CITRATE (PF) 100 MCG/2ML IJ SOLN
INTRAMUSCULAR | Status: DC | PRN
  Administered 2020-11-20 (×6): 25 ug via INTRAVENOUS

## 2020-11-20 MED ORDER — SODIUM CHLORIDE 0.9 % IR SOLN
Status: DC | PRN
  Administered 2020-11-20: 12000 mL
  Administered 2020-11-20: 6000 mL

## 2020-11-20 MED ORDER — PROPOFOL 10 MG/ML IV BOLUS
INTRAVENOUS | Status: DC | PRN
  Administered 2020-11-20: 120 mg via INTRAVENOUS

## 2020-11-20 MED ORDER — LACTATED RINGERS IV SOLN: INTRAVENOUS | Status: DC

## 2020-11-20 MED ORDER — ONDANSETRON HCL 4 MG/2ML IJ SOLN
4.0000 mg | Freq: Once | INTRAMUSCULAR | Status: AC | PRN
  Administered 2020-11-21: 4 mg via INTRAVENOUS

## 2020-11-20 MED ORDER — ATORVASTATIN CALCIUM 40 MG PO TABS
40.0000 mg | ORAL_TABLET | Freq: Every day | ORAL | Status: DC
  Administered 2020-11-20: 40 mg via ORAL
  Filled 2020-11-20: qty 1

## 2020-11-20 MED ORDER — HYDROMORPHONE HCL 1 MG/ML IJ SOLN: 0.5000 mg | INTRAMUSCULAR | Status: DC | PRN

## 2020-11-20 MED ORDER — SODIUM CHLORIDE 0.9 % IV SOLN: INTRAVENOUS | Status: DC

## 2020-11-20 MED ORDER — PROPOFOL 10 MG/ML IV BOLUS
INTRAVENOUS | Status: AC
  Filled 2020-11-20: qty 20

## 2020-11-20 MED ORDER — SODIUM CHLORIDE 0.9 % IR SOLN
3000.0000 mL | Status: DC
  Administered 2020-11-21: 3000 mL

## 2020-11-20 MED ORDER — DEXAMETHASONE SODIUM PHOSPHATE 10 MG/ML IJ SOLN
INTRAMUSCULAR | Status: AC
  Filled 2020-11-20: qty 1

## 2020-11-20 MED ORDER — SENNOSIDES-DOCUSATE SODIUM 8.6-50 MG PO TABS: 1.0000 | ORAL_TABLET | Freq: Two times a day (BID) | ORAL | 0 refills | Status: DC

## 2020-11-20 MED ORDER — ONDANSETRON HCL 4 MG/2ML IJ SOLN
INTRAMUSCULAR | Status: DC | PRN
  Administered 2020-11-20: 4 mg via INTRAVENOUS

## 2020-11-20 MED ORDER — FENTANYL CITRATE (PF) 100 MCG/2ML IJ SOLN
INTRAMUSCULAR | Status: AC
  Filled 2020-11-20: qty 2

## 2020-11-20 MED ORDER — GENTAMICIN SULFATE 40 MG/ML IJ SOLN
440.0000 mg | INTRAVENOUS | Status: AC
  Administered 2020-11-20: 440 mg via INTRAVENOUS
  Filled 2020-11-20: qty 11

## 2020-11-20 MED ORDER — POLYVINYL ALCOHOL 1.4 % OP SOLN
1.0000 [drp] | Freq: Three times a day (TID) | OPHTHALMIC | Status: DC
  Filled 2020-11-20: qty 15

## 2020-11-20 MED ORDER — CEPHALEXIN 500 MG PO CAPS: 500.0000 mg | ORAL_CAPSULE | Freq: Two times a day (BID) | ORAL | 0 refills | Status: DC

## 2020-11-20 MED ORDER — EPHEDRINE 5 MG/ML INJ
INTRAVENOUS | Status: AC
  Filled 2020-11-20: qty 10

## 2020-11-20 MED ORDER — DEXAMETHASONE SODIUM PHOSPHATE 4 MG/ML IJ SOLN
INTRAMUSCULAR | Status: DC | PRN
  Administered 2020-11-20: 10 mg via INTRAVENOUS

## 2020-11-20 MED ORDER — LISINOPRIL 20 MG PO TABS
20.0000 mg | ORAL_TABLET | Freq: Every day | ORAL | Status: DC
  Administered 2020-11-20: 20 mg via ORAL
  Filled 2020-11-20: qty 1

## 2020-11-20 MED ORDER — PANTOPRAZOLE SODIUM 40 MG PO TBEC: 40.0000 mg | DELAYED_RELEASE_TABLET | Freq: Every day | ORAL | Status: DC

## 2020-11-20 MED ORDER — FENTANYL CITRATE (PF) 100 MCG/2ML IJ SOLN: 25.0000 ug | INTRAMUSCULAR | Status: DC | PRN

## 2020-11-20 MED ORDER — LIDOCAINE 2% (20 MG/ML) 5 ML SYRINGE
INTRAMUSCULAR | Status: DC | PRN
  Administered 2020-11-20: 100 mg via INTRAVENOUS

## 2020-11-20 MED ORDER — ONDANSETRON HCL 4 MG/2ML IJ SOLN
INTRAMUSCULAR | Status: AC
  Filled 2020-11-20: qty 2

## 2020-11-20 MED ORDER — ACETAMINOPHEN 500 MG PO TABS
ORAL_TABLET | ORAL | Status: AC
  Filled 2020-11-20: qty 2

## 2020-11-20 MED ORDER — ACETAMINOPHEN 500 MG PO TABS
1000.0000 mg | ORAL_TABLET | Freq: Three times a day (TID) | ORAL | Status: AC
  Administered 2020-11-20 – 2020-11-21 (×3): 1000 mg via ORAL

## 2020-11-20 MED ORDER — EPHEDRINE SULFATE-NACL 50-0.9 MG/10ML-% IV SOSY
PREFILLED_SYRINGE | INTRAVENOUS | Status: DC | PRN
  Administered 2020-11-20 (×3): 15 mg via INTRAVENOUS

## 2020-11-20 MED ORDER — ACETAMINOPHEN 10 MG/ML IV SOLN: 1000.0000 mg | Freq: Once | INTRAVENOUS | Status: DC | PRN

## 2020-11-20 MED ORDER — HYDROCHLOROTHIAZIDE 25 MG PO TABS
50.0000 mg | ORAL_TABLET | Freq: Every day | ORAL | Status: DC
  Administered 2020-11-20: 50 mg via ORAL
  Filled 2020-11-20: qty 1

## 2020-11-20 SURGICAL SUPPLY — 25 items
BAG DRAIN URO-CYSTO SKYTR STRL (DRAIN) ×3 IMPLANT
BAG DRN RND TRDRP ANRFLXCHMBR (UROLOGICAL SUPPLIES) ×1
BAG DRN UROCATH (DRAIN) ×1
BAG URINE DRAIN 2000ML AR STRL (UROLOGICAL SUPPLIES) ×3 IMPLANT
CATH FOLEY 3WAY 30CC 24FR (CATHETERS) ×3
CATH URTH STD 24FR FL 3W 2 (CATHETERS) ×1 IMPLANT
CLOTH BEACON ORANGE TIMEOUT ST (SAFETY) ×3 IMPLANT
GLOVE BIO SURGEON STRL SZ7.5 (GLOVE) ×3 IMPLANT
GOWN STRL REUS W/ TWL LRG LVL3 (GOWN DISPOSABLE) ×1 IMPLANT
GOWN STRL REUS W/TWL LRG LVL3 (GOWN DISPOSABLE) ×3
GUIDEWIRE STR DUAL SENSOR (WIRE) ×3 IMPLANT
HOLDER FOLEY CATH W/STRAP (MISCELLANEOUS) ×6 IMPLANT
IV CATH 14GX2 1/4 (CATHETERS) ×3 IMPLANT
IV NS IRRIG 3000ML ARTHROMATIC (IV SOLUTION) ×18 IMPLANT
KIT TURNOVER CYSTO (KITS) ×3 IMPLANT
LOOP CUT BIPOLAR 24F LRG (ELECTROSURGICAL) ×3 IMPLANT
MANIFOLD NEPTUNE II (INSTRUMENTS) ×3 IMPLANT
PACK CYSTO (CUSTOM PROCEDURE TRAY) ×3 IMPLANT
SYR 30ML LL (SYRINGE) ×3 IMPLANT
SYR TOOMEY IRRIG 70ML (MISCELLANEOUS) ×3
SYRINGE TOOMEY IRRIG 70ML (MISCELLANEOUS) ×1 IMPLANT
TUBE CONNECTING 12'X1/4 (SUCTIONS) ×1
TUBE CONNECTING 12X1/4 (SUCTIONS) ×2 IMPLANT
TUBING UROLOGY SET (TUBING) ×3 IMPLANT
WATER STERILE IRR 500ML POUR (IV SOLUTION) ×3 IMPLANT

## 2020-11-20 NOTE — Brief Op Note (Signed)
11/20/2020  10:42 AM  PATIENT:  Jay Tran  86 y.o. male  PRE-OPERATIVE DIAGNOSIS:  PROSTATIC HYPERTROPHY, POOR EMPTYING  POST-OPERATIVE DIAGNOSIS:  PROSTATIC HYPERTROPHY, POOR EMPTYING  PROCEDURE:  Procedure(s): TRANSURETHRAL RESECTION OF THE PROSTATE (TURP) (Left)  SURGEON:  Surgeon(s) and Role:    Alexis Frock, MD - Primary  PHYSICIAN ASSISTANT:   ASSISTANTS: none   ANESTHESIA:   general  EBL:  100 mL   BLOOD ADMINISTERED:none  DRAINS: 37F 3 way foley to NS irrigation.    LOCAL MEDICATIONS USED:  NONE  SPECIMEN:  Source of Specimen:  prostate chips  DISPOSITION OF SPECIMEN:  PATHOLOGY  COUNTS:  YES  TOURNIQUET:  * No tourniquets in log *  DICTATION: .Other Dictation: Dictation Number [id number]  PLAN OF CARE: Admit for overnight observation  PATIENT DISPOSITION:  PACU - hemodynamically stable.   Delay start of Pharmacological VTE agent (>24hrs) due to surgical blood loss or risk of bleeding: yes

## 2020-11-20 NOTE — Transfer of Care (Signed)
Immediate Anesthesia Transfer of Care Note  Patient: Jay Tran  Procedure(s) Performed: Procedure(s) (LRB): TRANSURETHRAL RESECTION OF THE PROSTATE (TURP) (Left)  Patient Location: PACU  Anesthesia Type: General  Level of Consciousness: awake, sedated, patient cooperative and responds to stimulation  Airway & Oxygen Therapy: Patient Spontanous Breathing and Patient connected to [place] 02 and soft FM   Post-op Assessment: Report given to PACU RN, Post -op Vital signs reviewed and stable and Patient moving all extremities  Post vital signs: Reviewed and stable  Complications: No apparent anesthesia complications

## 2020-11-20 NOTE — Anesthesia Procedure Notes (Signed)
Procedure Name: LMA Insertion Date/Time: 11/20/2020 9:49 AM Performed by: Rogers Blocker, CRNA Pre-anesthesia Checklist: Patient identified, Emergency Drugs available, Suction available and Patient being monitored Patient Re-evaluated:Patient Re-evaluated prior to induction Oxygen Delivery Method: Circle System Utilized Preoxygenation: Pre-oxygenation with 100% oxygen Induction Type: IV induction Ventilation: Mask ventilation without difficulty LMA: LMA inserted LMA Size: 5.0 Number of attempts: 1 Placement Confirmation: positive ETCO2 Tube secured with: Tape Dental Injury: Teeth and Oropharynx as per pre-operative assessment

## 2020-11-20 NOTE — Anesthesia Postprocedure Evaluation (Signed)
Anesthesia Post Note  Patient: Jay Tran  Procedure(s) Performed: TRANSURETHRAL RESECTION OF THE PROSTATE (TURP) (N/A Prostate)     Patient location during evaluation: PACU Anesthesia Type: General Level of consciousness: awake and alert Pain management: pain level controlled Vital Signs Assessment: post-procedure vital signs reviewed and stable Respiratory status: spontaneous breathing, nonlabored ventilation, respiratory function stable and patient connected to nasal cannula oxygen Cardiovascular status: blood pressure returned to baseline and stable Postop Assessment: no apparent nausea or vomiting Anesthetic complications: no   No complications documented.  Last Vitals:  Vitals:   11/20/20 1052 11/20/20 1100  BP: (!) 193/78 (!) 158/66  Pulse: 93 85  Resp: 13 14  Temp: 36.7 C   SpO2: 97% 96%    Last Pain:  Vitals:   11/20/20 0846  TempSrc: Oral  PainSc: 0-No pain                 Barnet Glasgow

## 2020-11-20 NOTE — H&P (Signed)
Jay Tran is an 86 y.o. male.    Chief Complaint: Pre-OP Transurethral Resection of Prostate  HPI:   1 - Lower Urinary Tract Symptoms / Prostatic Hypertrophy / Incomplete Emptying - manages with tamsulosin + finasteride for years. PVR's up to 300cc asymptomatic x several. Had episdoe frank retention 2011. He does have h/o L spine surgery as well. Prostate Vol 152mL with modest median lobe by CT 2021.   07/2020 - PVR 728mL, also some problems from lumbago on max medical therapy. CT 110gm. UDS preserved but some compromised contractility with PdetMax 36 and low flow. Cysto corroborates bilobar prostatic hypertrophy only. NO strictures.   PMH sig for back surgery x4 (no deficits), hard of hearing. NO CV disease, NO strong blood thinners. His PCP is Dr. Arelia Sneddon.    Today Jay Tran is seen to proceed with TURP. NO interval fevers. Most recent UA without infectious parameters.   Past Medical History:  Diagnosis Date  . Arthritis   . BPH (benign prostatic hypertrophy)   . Carotid stenosis sx done feb 2021   right   . 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 (HCC)    Hx: of after 2013 back surgery  . GERD (gastroesophageal reflux disease)   . Hearing aid worn    Hx: of right ear  . Hyperlipemia   . Hypertension   . Lumbar herniated disc   . Lumbar stenosis   . Macular degeneration right eye dry  . PONV (postoperative nausea and vomiting)    takes iv nausea meds   . Seasonal allergies    Hx: of    Past Surgical History:  Procedure Laterality Date  . BACK SURGERY  2013, x 3 with dr Ronnald Ramp 2014, 2016 and 2019   x 4, takes back injections monthly  . COLONOSCOPY     Hx: of  . ENDARTERECTOMY Right 02/07/2020   Procedure: ENDARTERECTOMY CAROTID;  Surgeon: Serafina Mitchell, MD;  Location: [facility];  Service: Vascular;  Laterality: Right;  . EYE SURGERY     bil catarcts 02/2016  . HERNIA REPAIR  several yrs ago   inguinal  . LUMBAR LAMINECTOMY  10/26/2012   Procedure: MICRODISCECTOMY  LUMBAR LAMINECTOMY;  Surgeon: Marybelle Killings, MD;  Location: [facility];  Service: Orthopedics;  Laterality: Right;  Right L4-5 Microdiscectomy  . MAXIMUM ACCESS (MAS)POSTERIOR LUMBAR INTERBODY FUSION (PLIF) 1 LEVEL N/A 04/15/2015   Procedure: Maximum Access Surgery Posterior Lumbar Interbody Fusion Lumbar two-Lumbar three extension of hardware;  Surgeon: Eustace Moore, MD;  Location: [facility];  Service: Neurosurgery;  Laterality: N/A;  . PATCH ANGIOPLASTY Right 02/07/2020   Procedure: PATCH ANGIOPLASTY USING Rueben Bash BIOLOGIC PATCH;  Surgeon: Serafina Mitchell, MD;  Location: [facility];  Service: Vascular;  Laterality: Right;  . ROTATOR CUFF REPAIR     right shoulder - 3 times  . TONSILLECTOMY  as child  . TRANSURETHRAL RESECTION OF BLADDER TUMOR N/A 02/24/2017   Procedure: TRANSURETHRAL RESECTION OF BLADDER TUMOR (TURBT);  Surgeon: Alexis Frock, MD;  Location: [facility];  Service: Urology;  Laterality: N/A;    Family History  Problem Relation Age of Onset  . Stroke Mother   . Cancer - Prostate Father    Social History:  reports that he quit smoking about 57 years ago. His smoking use included cigarettes. He has a 15.00 pack-year smoking history. He has never used smokeless tobacco. He reports that he does not drink alcohol and does not use drugs.  Allergies: No Known Allergies  No medications prior to admission.    Results for orders placed or performed during the hospital encounter of 11/18/20 (from the past 48 hour(s))  SARS CORONAVIRUS 2 (TAT 6-24 HRS) Nasopharyngeal Nasopharyngeal Swab     Status: None   Collection Time: 11/18/20  1:45 PM   Specimen: Nasopharyngeal Swab  Result Value Ref Range   SARS Coronavirus 2 NEGATIVE NEGATIVE    Comment: (NOTE) SARS-CoV-2 target nucleic acids are NOT DETECTED.  The SARS-CoV-2 RNA is generally detectable in upper and lower respiratory specimens during the acute phase of infection. Negative results do not preclude SARS-CoV-2 infection, do not rule  out co-infections with other pathogens, and should not be used as the sole basis for treatment or other patient management decisions. Negative results must be combined with clinical observations, patient history, and epidemiological information. The expected result is Negative.  Fact Sheet for Patients: SugarRoll.be  Fact Sheet for Healthcare Providers: https://www.woods-mathews.com/  This test is not yet approved or cleared by the [place] FDA and  has been authorized for detection and/or diagnosis of SARS-CoV-2 by FDA under an Emergency Use Authorization (EUA). This EUA will remain  in effect (meaning this test can be used) for the duration of the COVID-19 declaration under Se ction 564(b)(1) of the Act, 21 U.S.C. section 360bbb-3(b)(1), unless the authorization is terminated or revoked sooner.  Performed at [facility], [place] [address], [place], [place] [address]    No results found.  Review of Systems  Constitutional: Negative for chills and fever.  Genitourinary: Positive for difficulty urinating and urgency.    Height 5\' 10"  (1.778 m), weight 86.6 kg. Physical Exam Vitals reviewed.  HENT:     Head: Normocephalic.     Nose: Nose normal.     Mouth/Throat:     Mouth: Mucous membranes are moist.  Eyes:     Pupils: Pupils are equal, round, and reactive to light.  Cardiovascular:     Rate and Rhythm: Normal rate.     Pulses: Normal pulses.  Pulmonary:     Effort: Pulmonary effort is normal.  Abdominal:     General: Abdomen is flat.  Genitourinary:    Comments: No CVAT at present.  Musculoskeletal:        General: Normal range of motion.     Cervical back: Normal range of motion.  Skin:    General: Skin is warm.  Neurological:     General: No focal deficit present.     Mental Status: He is alert.  Psychiatric:        Mood and Affect: Mood normal.      Assessment/Plan  Proceed as planned with  transurethral resection of prostate. Risks, benefits, alternatives, expected peri-op course discussed previously and reiterated today including likely observation admission and DC tomorrow with foley in place.   Alexis Frock, MD 11/20/2020, 8:01 AM

## 2020-11-21 DIAGNOSIS — N401 Enlarged prostate with lower urinary tract symptoms: Secondary | ICD-10-CM | POA: Diagnosis not present

## 2020-11-21 LAB — HEMOGLOBIN AND HEMATOCRIT, BLOOD
HCT: 37.2 % — ABNORMAL LOW (ref 39.0–52.0)
Hemoglobin: 12.7 g/dL — ABNORMAL LOW (ref 13.0–17.0)

## 2020-11-21 MED ORDER — ACETAMINOPHEN 500 MG PO TABS
ORAL_TABLET | ORAL | Status: AC
  Filled 2020-11-21: qty 2

## 2020-11-21 MED ORDER — ONDANSETRON HCL 4 MG/2ML IJ SOLN
INTRAMUSCULAR | Status: AC
  Filled 2020-11-21: qty 2

## 2020-11-21 NOTE — Discharge Instructions (Signed)
Transurethral Resection of the Prostate, Care After This sheet gives you information about how to care for yourself after your procedure. Your health care provider may also give you more specific instructions. If you have problems or questions, contact your health care provider. What can I expect after the procedure? After the procedure, it is common to have:  Mild pain in your lower abdomen.  Soreness or mild discomfort in your penis from having the catheter inserted during the procedure.  A feeling of urgency when you need to urinate.  A small amount of blood in your urine. You may notice some small blood clots in your urine. These are normal. Follow these instructions at home: Medicines  Take over-the-counter and prescription medicines only as told by your health care provider.  If you were prescribed an antibiotic medicine, take it as told by your health care provider. Do not stop taking the antibiotic even if you start to feel better.  Ask your health care provider if the medicine prescribed to you: ? Requires you to avoid driving or using heavy machinery. ? Can cause constipation. You may need to take actions to prevent or treat constipation, such as:  Take over-the-counter or prescription medicines.  Eat foods that are high in fiber, such as fresh fruits and vegetables, whole grains, and beans.  Limit foods that are high in fat and processed sugars, such as fried or sweet foods.  Do not drive for 24 hours if you were given a sedative during your procedure. Activity   Return to your normal activities as told by your health care provider. Ask your health care provider what activities are safe for you.  Do not lift anything that is heavier than 10 lb (4.5 kg), or the limit that you are told, for 3 weeks after the procedure or until your health care provider says that it is safe.  Avoid intense physical activity for as long as told by your health care provider.  Avoid  sitting for a long time without moving. Get up and move around one or more times every few hours. This helps to prevent blood clots. You may increase your physical activity gradually as you start to feel better. Lifestyle  Do not drink alcohol for as long as told by your health care provider. This is especially important if you are taking prescription pain medicines.  Do not engage in sexual activity until your health care provider says that you can do this. General instructions   Do not take baths, swim, or use a hot tub until your health care provider approves.  Drink enough fluid to keep your urine pale yellow.  Urinate as soon as you feel the need to. Do not try to hold your urine for long periods of time.  If your health care provider approves, you may take a stool softener for 2-3 weeks to prevent you from straining to have a bowel movement.  Wear compression stockings as told by your health care provider. These stockings help to prevent blood clots and reduce swelling in your legs.  Keep all follow-up visits as told by your health care provider. This is important. Contact a health care provider if you have:  Difficulty urinating.  A fever.  Pain that gets worse or does not improve with medicine.  Blood in your urine that does not go away after 1 week of resting and drinking more fluids.  Swelling in your penis or testicles. Get help right away if:  You are unable   to urinate.  You are having more blood clots in your urine instead of fewer.  You have: ? Large blood clots. ? A lot of blood in your urine. ? Pain in your back or lower abdomen. ? Pain or swelling in your legs. ? Chills and you are shaking. ? Difficulty breathing or shortness of breath. Summary  After the procedure, it is common to have a small amount of blood in your urine.  Avoid heavy lifting and intense physical activity for as long as told by your health care provider.  Urinate as soon as you  feel the need to. Do not try to hold your urine for long periods of time.  Keep all follow-up visits as told by your health care provider. This is important. This information is not intended to replace advice given to you by your health care provider. Make sure you discuss any questions you have with your health care provider. Document Revised: 03/27/2019 Document Reviewed: 09/05/2018 Elsevier Patient Education  [place], Adult An indwelling urinary catheter is a thin tube that is put into your bladder. The tube helps to drain pee (urine) out of your body. The tube goes in through your urethra. Your urethra is where pee comes out of your body. Your pee will come out through the catheter, then it will go into a bag (drainage bag). Take good care of your catheter so it will work well. How to wear your catheter and bag Supplies needed  Sticky tape (adhesive tape) or a leg strap.  Alcohol wipe or soap and water (if you use tape).  A clean towel (if you use tape).  Large overnight bag.  Smaller bag (leg bag). Wearing your catheter Attach your catheter to your leg with tape or a leg strap.  Make sure the catheter is not pulled tight.  If a leg strap gets wet, take it off and put on a dry strap.  If you use tape to hold the bag on your leg: 1. Use an alcohol wipe or soap and water to wash your skin where the tape made it sticky before. 2. Use a clean towel to pat-dry that skin. 3. Use new tape to make the bag stay on your leg. Wearing your bags You should have been given a large overnight bag.  You may wear the overnight bag in the day or night.  Always have the overnight bag lower than your bladder.  Do not let the bag touch the floor.  Before you go to sleep, put a clean plastic bag in a wastebasket. Then hang the overnight bag inside the wastebasket. You should also have a smaller leg bag that fits under your clothes.  Always wear the  leg bag below your knee.  Do not wear your leg bag at night. How to care for your skin and catheter Supplies needed  A clean washcloth.  Water and mild soap.  A clean towel. Caring for your skin and catheter      Clean the skin around your catheter every day: 1. Wash your hands with soap and water. 2. Wet a clean washcloth in warm water and mild soap. 3. Clean the skin around your urethra.  If you are male:  Gently spread the folds of skin around your vagina (labia).  With the washcloth in your other hand, wipe the inner side of your labia on each side. Wipe from front to back.  If you are male:  Pull back  any skin that covers the end of your penis (foreskin).  With the washcloth in your other hand, wipe your penis in small circles. Start wiping at the tip of your penis, then move away from the catheter.  Move the foreskin back in place, if needed. 4. With your free hand, hold the catheter close to where it goes into your body.  Keep holding the catheter during cleaning so it does not get pulled out. 5. With the washcloth in your other hand, clean the catheter.  Only wipe downward on the catheter.  Do not wipe upward toward your body. Doing this may push germs into your urethra and cause infection. 6. Use a clean towel to pat-dry the catheter and the skin around it. Make sure to wipe off all soap. 7. Wash your hands with soap and water.  Shower every day. Do not take baths.  Do not use cream, ointment, or lotion on the area where the catheter goes into your body, unless your doctor tells you to.  Do not use powders, sprays, or lotions on your genital area.  Check your skin around the catheter every day for signs of infection. Check for: ? Redness, swelling, or pain. ? Fluid or blood. ? Warmth. ? Pus or a bad smell. How to empty the bag Supplies needed  Rubbing alcohol.  Gauze pad or cotton ball.  Tape or a leg strap. Emptying the bag Pour the pee out  of your bag when it is ?- full, or at least 2-3 times a day. Do this for your overnight bag and your leg bag. 1. Wash your hands with soap and water. 2. Separate (detach) the bag from your leg. 3. Hold the bag over the toilet or a clean pail. Keep the bag lower than your hips and bladder. This is so the pee (urine) does not go back into the tube. 4. Open the pour spout. It is at the bottom of the bag. 5. Empty the pee into the toilet or pail. Do not let the pour spout touch any surface. 6. Put rubbing alcohol on a gauze pad or cotton ball. 7. Use the gauze pad or cotton ball to clean the pour spout. 8. Close the pour spout. 9. Attach the bag to your leg with tape or a leg strap. 10. Wash your hands with soap and water. Follow instructions for cleaning the drainage bag:  From the product maker.  As told by your doctor. How to change the bag Supplies needed  Alcohol wipes.  A clean bag.  Tape or a leg strap. Changing the bag Replace your bag when it starts to leak, smell bad, or look dirty. 1. Wash your hands with soap and water. 2. Separate the dirty bag from your leg. 3. Pinch the catheter with your fingers so that pee does not spill out. 4. Separate the catheter tube from the bag tube where these tubes connect (at the connection valve). Do not let the tubes touch any surface. 5. Clean the end of the catheter tube with an alcohol wipe. Use a different alcohol wipe to clean the end of the bag tube. 6. Connect the catheter tube to the tube of the clean bag. 7. Attach the clean bag to your leg with tape or a leg strap. Do not make the bag tight on your leg. 8. Wash your hands with soap and water. General rules   Never pull on your catheter. Never try to take it out. Doing that can hurt  you.  Always wash your hands before and after you touch your catheter or bag. Use a mild, fragrance-free soap. If you do not have soap and water, use hand sanitizer.  Always make sure there are  no twists or bends (kinks) in the catheter tube.  Always make sure there are no leaks in the catheter or bag.  Drink enough fluid to keep your pee pale yellow.  Do not take baths, swim, or use a hot tub.  If you are male, wipe from front to back after you poop (have a bowel movement). Contact a doctor if:  Your pee is cloudy.  Your pee smells worse than usual.  Your catheter gets clogged.  Your catheter leaks.  Your bladder feels full. Get help right away if:  You have redness, swelling, or pain where the catheter goes into your body.  You have fluid, blood, pus, or a bad smell coming from the area where the catheter goes into your body.  Your skin feels warm where the catheter goes into your body.  You have a fever.  You have pain in your: ? Belly (abdomen). ? Legs. ? Lower back. ? Bladder.  You see blood in the catheter.  Your pee is pink or red.  You feel sick to your stomach (nauseous).  You throw up (vomit).  You have chills.  Your pee is not draining into the bag.  Your catheter gets pulled out. Summary  An indwelling urinary catheter is a thin tube that is placed into the bladder to help drain pee (urine) out of the body.  The catheter is placed into the part of the body that drains pee from the bladder (urethra).  Taking good care of your catheter will keep it working properly and help prevent problems.  Always wash your hands before and after touching your catheter or bag.  Never pull on your catheter or try to take it out. This information is not intended to replace advice given to you by your health care provider. Make sure you discuss any questions you have with your health care provider. Document Revised: 03/29/2019 Document Reviewed: 07/21/2017 Elsevier Patient Education  [place] may have urinary urgency (bladder spasms) and bloody urine on / off for up to 3 weeks. This is normal.  2 - Call MD or go to ER for fever  >102, severe pain / nausea / vomiting not relieved by medications, or acute change in medical status

## 2020-11-21 NOTE — Discharge Summary (Signed)
Physician Discharge Summary  Patient ID: Jay Tran MRN: [id number] DOB/AGE: [birth year] 86 y.o.  Admit date: 11/20/2020 Discharge date: 11/21/2020  Admission Diagnoses:  Benign prostatic hyperplasia with lower urinary tract symptoms  Discharge Diagnoses:  Principal Problem:   Benign prostatic hyperplasia with lower urinary tract symptoms   Past Medical History:  Diagnosis Date  . Arthritis   . BPH (benign prostatic hypertrophy)   . Carotid stenosis sx done feb 2021   right   . 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 (HCC)    Hx: of after 2013 back surgery  . GERD (gastroesophageal reflux disease)   . Hearing aid worn    Hx: of right ear  . Hyperlipemia   . Hypertension   . Lumbar herniated disc   . Lumbar stenosis   . Macular degeneration right eye dry  . PONV (postoperative nausea and vomiting)    takes iv nausea meds   . Seasonal allergies    Hx: of    Surgeries: Procedure(s): TRANSURETHRAL RESECTION OF THE PROSTATE (TURP) on 11/20/2020   Consultants (if any):   Discharged Condition: Improved  Hospital Course: SEARS ORAN is an 86 y.o. male who was admitted 11/20/2020 with a diagnosis of Benign prostatic hyperplasia with lower urinary tract symptoms and went to the operating room on 11/20/2020 and underwent the above named procedures.  His urine is very light pink on minimal CBI this morning.  He will be discharged home with the foley.    He was given perioperative antibiotics:  Anti-infectives (From admission, onward)   Start     Dose/Rate Route Frequency Ordered Stop   11/20/20 0834  gentamicin (GARAMYCIN) 440 mg in dextrose 5 % 100 mL IVPB        440 mg 111 mL/hr over 60 Minutes Intravenous 30 min pre-op 11/20/20 0834 11/20/20 0947   11/20/20 0000  cephALEXin (KEFLEX) 500 MG capsule        500 mg Oral 2 times daily 11/20/20 1052      .  He was given sequential compression devices for DVT prophylaxis.  He benefited maximally from the hospital stay  and there were no complications.    Recent vital signs:  Vitals:   11/21/20 0215 11/21/20 0550  BP: (!) 133/58 135/61  Pulse: 68 65  Resp: 14 16  Temp: 98.7 F (37.1 C) 98.3 F (36.8 C)  SpO2: 96% 97%    Recent laboratory studies:  Lab Results  Component Value Date   HGB 12.7 (L) 11/21/2020   HGB 13.4 11/20/2020   HGB 14.3 11/20/2020   Lab Results  Component Value Date   WBC 13.8 (H) 02/08/2020   PLT 296 02/08/2020   Lab Results  Component Value Date   INR 1.0 02/04/2020   Lab Results  Component Value Date   NA 132 (L) 11/20/2020   K 3.7 11/20/2020   CL 95 (L) 11/20/2020   CO2 25 02/08/2020   BUN 19 11/20/2020   CREATININE 1.20 11/20/2020   GLUCOSE 101 (H) 11/20/2020    Discharge Medications:   Allergies as of 11/21/2020   No Known Allergies     Medication List    STOP taking these medications   aspirin EC 81 MG tablet     TAKE these medications   acetaminophen 500 MG tablet Commonly known as: TYLENOL Take 1,000 mg by mouth every 6 (six) hours as needed.   ALLERGY PO Take by mouth.   atorvastatin 40 MG tablet Commonly known as:  LIPITOR Take 1 tablet (40 mg total) by mouth daily at 6 PM.   cephALEXin 500 MG capsule Commonly known as: Keflex Take 1 capsule (500 mg total) by mouth 2 (two) times daily. X 3 days. Begin day before next Urology appointment.   finasteride 5 MG tablet Commonly known as: PROSCAR Take 5 mg by mouth at bedtime.   Fish Oil 1200 MG Caps Take 1,200 mg by mouth daily with breakfast.   hydrochlorothiazide 50 MG tablet Commonly known as: HYDRODIURIL Take 50 mg by mouth at bedtime.   lisinopril 20 MG tablet Commonly known as: ZESTRIL Take 20 mg by mouth at bedtime.   naproxen sodium 220 MG tablet Commonly known as: ALEVE Take 220 mg by mouth. 440 mg   omeprazole 40 MG capsule Commonly known as: PRILOSEC Take 1 capsule by mouth once daily What changed:   how much to take  how to take this  when to take this    ondansetron 8 MG tablet Commonly known as: ZOFRAN Take by mouth every 8 (eight) hours as needed for nausea or vomiting.   PRESERVISION AREDS 2 PO Take 1 capsule by mouth 2 (two) times daily.   QC TUMERIC COMPLEX PO Take 1 capsule by mouth daily with breakfast.   SALONPAS PAIN RELIEF PATCH EX Place 1 patch onto the skin daily as needed (to affected area for pain).   senna-docusate 8.6-50 MG tablet Commonly known as: Senokot-S Take 1 tablet by mouth 2 (two) times daily. While taking strong pain meds to prevent constipation.   Systane 0.4-0.3 % Soln Generic drug: Polyethyl Glycol-Propyl Glycol Place 1 drop into both eyes in the morning, at noon, and at bedtime.   tamsulosin 0.4 MG Caps capsule Commonly known as: FLOMAX Take 0.4 mg by mouth at bedtime.   tiZANidine 4 MG tablet Commonly known as: Zanaflex Take 1 tablet (4 mg total) by mouth every 8 (eight) hours as needed for muscle spasms. What changed: when to take this   traMADol 50 MG tablet Commonly known as: ULTRAM Take 1-2 tablets (50-100 mg total) by mouth every 6 (six) hours as needed for moderate pain or severe pain. Post-operatively What changed:   how much to take  reasons to take this  additional instructions   trimethoprim-polymyxin b ophthalmic solution Commonly known as: POLYTRIM Place 1 drop into the right eye See admin instructions. INSTILL 1 DROP INTO THE RIGHT EYE 4 TIMES DAILY FOR 2 DAYS AFTER EACH MONTHLY EYE INJECTION       Diagnostic Studies: No results found.  Disposition: Discharge disposition: 01-Home or Self Care       Discharge Instructions    Discontinue IV   Complete by: As directed    Urinary leg bag   Complete by: As directed        Follow-up Information    Alexis Frock, MD On 11/23/2020.   Specialty: Urology Why: at 10 AM for MD visit and office catheter removal.  Contact information: [place] [phone]                 Signed: Irine Seal 11/21/2020, 7:30 AM

## 2020-11-23 ENCOUNTER — Encounter (HOSPITAL_BASED_OUTPATIENT_CLINIC_OR_DEPARTMENT_OTHER): Payer: Self-pay | Admitting: Urology

## 2020-11-23 LAB — SURGICAL PATHOLOGY

## 2020-11-23 NOTE — Op Note (Signed)
NAME: MINOR, IDEN. MEDICAL RECORD [id number] ACCOUNT [id number] DATE OF BIRTH:1934/08/04 FACILITY: [facility] LOCATION: [facility] PHYSICIAN:Cornelius Schuitema, MD  OPERATIVE REPORT  DATE OF PROCEDURE:  11/20/2020  PREOPERATIVE DIAGNOSES:  Progressive prostatic hypertrophy with intermittent urinary retention and poor bladder emptying.  PROCEDURE:  Transection of the prostate.  ESTIMATED BLOOD LOSS:  100 mL.  MEDICATIONS:  None.  SPECIMENS:  Prostate chips for permanent pathology.  FINDINGS: 1.  Significant bilobar prostatic hypertrophy with kissing lobes, pre-resection. 2.  Wide open urinary channel from the bladder neck to verumontanum post-resection. 3.  Moderate trabeculated bladder.  INDICATIONS:  The patient is a very pleasant 86 year old man with longstanding history of obstructive voiding symptoms.  He is on maximal medical therapy with 5-alpha reductase inhibitors and alpha blockers for some time.  Despite this, he has had  worsening residuals and intermittent retention from time to time.  Urodynamics corroborated obstruction and somewhat weakened, but preserved bladder contractility.  Options were discussed for management including continued care versus self-cath versus  outlet procedure.  He wished to proceed with the latter.  Given his prostate size at approximately 100 grams and his overall health, it was felt that a transurethral resection of the prostate is the most advantageous risk/benefit profile of outlet  procedure and he wished to proceed.  Informed consent was obtained and placed in medical record.  PROCEDURE IN DETAIL:  The patient being identified, procedure being transection of the prostate was confirmed.  Procedure timeout was performed.  Intravenous antibiotics administered.  General anesthesia induced.  The patient was placed into a low  lithotomy position.  Sterile field was created, prepped and draped base of the penis, perineum and proximal thighs using  iodine.  Cystourethroscopy was performed using a 26-French resectoscope sheath with visual obturator.  Inspection of the anterior and  posterior urethra only revealed very large bilobar prostatic hypertrophy with kissing lobes.  Inspection of bladder revealed moderate trabeculation.  Ureteral orifices were singleton bilaterally.  Next, using the resectoscope loop, a transurethral  resection was performed in a top-down fashion.  First at 12 o'clock position from the bladder neck to the verumontanum performing a 12 o'clock channel for irrigation flow and then of the right lobe of the prostate from the 12 o'clock to 6 o'clock  position down to superficial fibromuscular stroma of the prostatic capsule, taking exquisite care not to undermine the bladder neck and then finally the left lobe of the prostate from the 12 o'clock to 6 o'clock position in a mirror image fashion.  This  resulted in excellent resolution of the obstructing portion of the lateral lobes.  Prostate chips were irrigated and set aside for permanent pathology.  The entire base of the resection area was fulgurated using coagulation current which revealed  excellent hemostasis.  Next 0.038 sensor wire was advanced to the level of the urinary bladder and the cystoscope sheath was exchanged for a 24-French 3-way Foley catheter to normal saline irrigation, efflux was very light pink, 20 mL sterile water in  the balloon placed on very gentle light traction and the procedure was terminated.  The patient tolerated the procedure well.  No immediate complications.  The patient was taken to postanesthesia care in stable condition.  Plan for observation admission, likely discharge home tomorrow with catheter.  IN/NUANCE  D:11/20/2020 T:11/21/2020 JOB:[id number]

## 2020-12-30 ENCOUNTER — Other Ambulatory Visit: Payer: Self-pay | Admitting: *Deleted

## 2020-12-30 DIAGNOSIS — I6523 Occlusion and stenosis of bilateral carotid arteries: Secondary | ICD-10-CM

## 2021-01-07 ENCOUNTER — Other Ambulatory Visit (INDEPENDENT_AMBULATORY_CARE_PROVIDER_SITE_OTHER): Payer: Self-pay | Admitting: Otolaryngology

## 2021-01-07 DIAGNOSIS — K219 Gastro-esophageal reflux disease without esophagitis: Secondary | ICD-10-CM

## 2021-01-11 ENCOUNTER — Ambulatory Visit (HOSPITAL_COMMUNITY)
Admission: RE | Admit: 2021-01-11 | Discharge: 2021-01-11 | Disposition: A | Discharge status: Home / Self Care | Payer: Medicare Other | Source: Ambulatory Visit | Attending: Surgery | Admitting: Surgery

## 2021-01-11 ENCOUNTER — Other Ambulatory Visit: Payer: Self-pay

## 2021-01-11 ENCOUNTER — Ambulatory Visit (INDEPENDENT_AMBULATORY_CARE_PROVIDER_SITE_OTHER): Payer: Medicare Other | Admitting: Surgery

## 2021-01-11 ENCOUNTER — Encounter: Payer: Self-pay | Admitting: Surgery

## 2021-01-11 VITALS — BP 143/76 | HR 63 | Temp 98.2°F | Resp 18 | Ht 69.0 in | Wt 190.0 lb

## 2021-01-11 DIAGNOSIS — I6523 Occlusion and stenosis of bilateral carotid arteries: Secondary | ICD-10-CM | POA: Diagnosis present

## 2021-01-11 DIAGNOSIS — I6521 Occlusion and stenosis of right carotid artery: Secondary | ICD-10-CM | POA: Diagnosis not present

## 2021-01-11 NOTE — Progress Notes (Signed)
[facility]  Patient name: Jay Tran MRN: [id number] DOB: [birth year] Sex: male   REASON FOR VISIT:    Follow up  HISOTRY OF PRESENT ILLNESS:    Jay Tran is a 87 y.o. male who presented to the hospital with retinal artery occlusion and carotid stenosis.  On 02/07/2020 he underwent right carotid endarterectomy with patch angioplasty.  Intraoperative findings included a 70% stenosis.  This was associated with an ulcer.  He has no complaints from his surgery.    He has not had any significant change in his vision.  He denies any new symptoms.  He remains on his statin and blood pressure medication.   PAST MEDICAL HISTORY:   Past Medical History:  Diagnosis Date  . Arthritis   . BPH (benign prostatic hypertrophy)   . Carotid stenosis sx done feb 2021   right   . 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 (HCC)    Hx: of after 2013 back surgery  . GERD (gastroesophageal reflux disease)   . Hearing aid worn    Hx: of right ear  . Hyperlipemia   . Hypertension   . Lumbar herniated disc   . Lumbar stenosis   . Macular degeneration right eye dry  . PONV (postoperative nausea and vomiting)    takes iv nausea meds   . Seasonal allergies    Hx: of     FAMILY HISTORY:   Family History  Problem Relation Age of Onset  . Stroke Mother   . Cancer - Prostate Father     SOCIAL HISTORY:   Social History   Tobacco Use  . Smoking status: Former Smoker    Packs/day: 3.00    Years: 5.00    Pack years: 15.00    Types: Cigarettes    Quit date: 11/12/1963    Years since quitting: 57.2  . Smokeless tobacco: Never Used  Substance Use Topics  . Alcohol use: No     ALLERGIES:   No Known Allergies   CURRENT MEDICATIONS:   Current Outpatient Medications  Medication Sig Dispense Refill  . acetaminophen (TYLENOL) 500 MG tablet Take 1,000 mg by mouth every 6 (six) hours as needed.    [name] atorvastatin  (LIPITOR) 40 MG tablet Take 1 tablet (40 mg total) by mouth daily at 6 PM. 90 tablet 3  . Chlorpheniramine Maleate (ALLERGY PO) Take by mouth.    . finasteride (PROSCAR) 5 MG tablet Take 5 mg by mouth at bedtime.     . hydrochlorothiazide (HYDRODIURIL) 50 MG tablet Take 50 mg by mouth at bedtime.     . Liniments (SALONPAS PAIN RELIEF PATCH EX) Place 1 patch onto the skin daily as needed (to affected area for pain).     [name] lisinopril (PRINIVIL,ZESTRIL) 20 MG tablet Take 20 mg by mouth at bedtime.     . Multiple Vitamins-Minerals (PRESERVISION AREDS 2 PO) Take 1 capsule by mouth 2 (two) times daily.     . naproxen sodium (ALEVE) 220 MG tablet Take 220 mg by mouth. 440 mg    . Omega-3 Fatty Acids (FISH OIL) 1200 MG CAPS Take 1,200 mg by mouth daily with breakfast.     . omeprazole (PRILOSEC) 40 MG capsule Take 1 capsule by mouth once daily (Patient taking differently: every evening.) 30 capsule 0  . ondansetron (ZOFRAN) 8 MG tablet Take by mouth every 8 (eight) hours as needed for nausea or vomiting.    [name] SYSTANE 0.4-0.3 %  SOLN Place 1 drop into both eyes in the morning, at noon, and at bedtime.    . Tamsulosin HCl (FLOMAX) 0.4 MG CAPS Take 0.4 mg by mouth at bedtime.     [name] tiZANidine (ZANAFLEX) 4 MG tablet Take 1 tablet (4 mg total) by mouth every 8 (eight) hours as needed for muscle spasms. (Patient taking differently: Take 4 mg by mouth at bedtime as needed for muscle spasms.) 60 tablet 1  . traMADol (ULTRAM) 50 MG tablet Take 1-2 tablets (50-100 mg total) by mouth every 6 (six) hours as needed for moderate pain or severe pain. Post-operatively 20 tablet 0  . trimethoprim-polymyxin b (POLYTRIM) ophthalmic solution Place 1 drop into the right eye See admin instructions. INSTILL 1 DROP INTO THE RIGHT EYE 4 TIMES DAILY FOR 2 DAYS AFTER EACH MONTHLY EYE INJECTION    . Turmeric (QC TUMERIC COMPLEX PO) Take 1 capsule by mouth daily with breakfast.     . cephALEXin (KEFLEX) 500 MG capsule Take 1 capsule  (500 mg total) by mouth 2 (two) times daily. X 3 days. Begin day before next Urology appointment. 6 capsule 0  . senna-docusate (SENOKOT-S) 8.6-50 MG tablet Take 1 tablet by mouth 2 (two) times daily. While taking strong pain meds to prevent constipation. 6 tablet 0   No current facility-administered medications for this visit.    REVIEW OF SYSTEMS:   [X]  denotes positive finding, [ ]  denotes negative finding Cardiac  Comments:  Chest pain or chest pressure:    Shortness of breath upon exertion:    Short of breath when lying flat:    Irregular heart rhythm:        Vascular    Pain in calf, thigh, or hip brought on by ambulation:    Pain in feet at night that wakes you up from your sleep:     Blood clot in your veins:    Leg swelling:         Pulmonary    Oxygen at home:    Productive cough:     Wheezing:         Neurologic    Sudden weakness in arms or legs:     Sudden numbness in arms or legs:     Sudden onset of difficulty speaking or slurred speech:    Temporary loss of vision in one eye:     Problems with dizziness:         Gastrointestinal    Blood in stool:     Vomited blood:         Genitourinary    Burning when urinating:     Blood in urine:        Psychiatric    Major depression:         Hematologic    Bleeding problems:    Problems with blood clotting too easily:        Skin    Rashes or ulcers:        Constitutional    Fever or chills:      PHYSICAL EXAM:   Vitals:   01/11/21 1342 01/11/21 1344  BP: (!) 152/80 (!) 143/76  Pulse: 64 63  Resp: 18   Temp: 98.2 F (36.8 C)   TempSrc: Temporal   SpO2: 94%   Weight: 190 lb (86.2 kg)   Height: 5\' 9"  (1.753 m)     GENERAL: The patient is a well-nourished male, in no acute distress. The vital signs are documented above. CARDIAC: There  is a regular rate and rhythm.  VASCULAR: Palpable left dorsalis pedis pulse, nonpalpable right PULMONARY: Non-labored respirations MUSCULOSKELETAL: There are  no major deformities or cyanosis. NEUROLOGIC: No focal weakness or paresthesias are detected. SKIN: There are no ulcers or rashes noted. PSYCHIATRIC: The patient has a normal affect.  STUDIES:   I have reviewed the following carotid u/s: Right Carotid: There is no evidence of stenosis in the right ICA.         Non-hemodynamically significant plaque <50% noted in the  CCA.   Left Carotid: Velocities in the left ICA are consistent with a 1-39%  stenosis.   Vertebrals: Bilateral vertebral arteries demonstrate antegrade flow.  Subclavians: Normal flow hemodynamics were seen in bilateral subclavian        arteries.   MEDICAL ISSUES:   Carotid stenosis: The patient's right carotid endarterectomy is widely patent by ultrasound.  He denies any new symptoms.  No medication changes were made today.  He will follow-up with surveillance duplex in 1 year.    Leia Alf, MD, FACS [facility] [phone] Pager [phone]

## 2021-01-22 ENCOUNTER — Encounter (INDEPENDENT_AMBULATORY_CARE_PROVIDER_SITE_OTHER): Payer: Medicare Other | Admitting: Ophthalmology

## 2021-01-22 ENCOUNTER — Other Ambulatory Visit: Payer: Self-pay

## 2021-01-22 DIAGNOSIS — H35033 Hypertensive retinopathy, bilateral: Secondary | ICD-10-CM

## 2021-01-22 DIAGNOSIS — I10 Essential (primary) hypertension: Secondary | ICD-10-CM | POA: Diagnosis not present

## 2021-01-22 DIAGNOSIS — H47211 Primary optic atrophy, right eye: Secondary | ICD-10-CM

## 2021-01-22 DIAGNOSIS — H353211 Exudative age-related macular degeneration, right eye, with active choroidal neovascularization: Secondary | ICD-10-CM | POA: Diagnosis not present

## 2021-01-22 DIAGNOSIS — H3411 Central retinal artery occlusion, right eye: Secondary | ICD-10-CM

## 2021-01-22 DIAGNOSIS — H353122 Nonexudative age-related macular degeneration, left eye, intermediate dry stage: Secondary | ICD-10-CM | POA: Diagnosis not present

## 2021-01-22 DIAGNOSIS — H43813 Vitreous degeneration, bilateral: Secondary | ICD-10-CM

## 2021-01-27 ENCOUNTER — Encounter: Payer: Self-pay | Admitting: Adult Health

## 2021-01-27 ENCOUNTER — Ambulatory Visit (INDEPENDENT_AMBULATORY_CARE_PROVIDER_SITE_OTHER): Payer: Medicare Other | Admitting: Adult Health

## 2021-01-27 VITALS — BP 140/68 | HR 63 | Ht 69.0 in | Wt 198.0 lb

## 2021-01-27 DIAGNOSIS — I6521 Occlusion and stenosis of right carotid artery: Secondary | ICD-10-CM | POA: Diagnosis not present

## 2021-01-27 DIAGNOSIS — E785 Hyperlipidemia, unspecified: Secondary | ICD-10-CM

## 2021-01-27 DIAGNOSIS — Z9889 Other specified postprocedural states: Secondary | ICD-10-CM | POA: Diagnosis not present

## 2021-01-27 DIAGNOSIS — I10 Essential (primary) hypertension: Secondary | ICD-10-CM

## 2021-01-27 DIAGNOSIS — H3411 Central retinal artery occlusion, right eye: Secondary | ICD-10-CM

## 2021-01-27 NOTE — Progress Notes (Signed)
I agree with the above plan 

## 2021-01-27 NOTE — Patient Instructions (Signed)
Continue aspirin 81 mg daily  and atorvastatin 40 mg daily for secondary stroke prevention  We will check your cholesterol levels today to ensure satisfactory management  Continue to follow with your eye doctor as scheduled and as needed  Continue to follow up with PCP regarding cholesterol and blood pressure management  Maintain strict control of hypertension with blood pressure goal below 130/90 and cholesterol with LDL cholesterol (bad cholesterol) goal below 70 mg/dL.       Followup in the future with me in 6 months or call earlier if needed       Thank you for coming to see [place] at [facility]. I hope we have been able to provide you high quality care today.  You may receive a patient satisfaction survey over the next few weeks. We would appreciate your feedback and comments so that we may continue to improve ourselves and the health of our patients.

## 2021-01-27 NOTE — Progress Notes (Signed)
[facility] [address] [place]. [place] [address] [phone]       STROKE FOLLOW UP NOTE  Mr. KERMIT ARNETTE Date of Birth:  1934-11-21 Medical Record Number:  [id number]   Reason for Referral: stroke follow up    CHIEF COMPLAINT:  Chief Complaint  Patient presents with  . Follow-up    TR alone  Pt is well, having some vision problems     HPI:  Today, 01/27/2021, Mr. Heber returns for 59-month stroke follow-up unaccompanied. Stable without new stroke/TIA symptoms and reports residual visual impairment right eye which has been stable without worsening. Continues to follow routinely with ophthalmology. Per patient, reports 95% visual loss although he was cleared to drive which he has been doing without difficulty.  Remains on aspirin and atorvastatin tolerating without side effects. Blood pressure today 140/68. Recent carotid ultrasound with Dr. Trula Slade showed widely patent right ICA s/p CEA and left ICA 1 to 39% stenosis. No concerns at this time.   History provided for reference purposes only Update 07/27/2020 JM: Mr. Oyervides returns for follow-up regarding OD CRAO in 01/2020 accompanied by his wife.   Residual deficits right eye visual impairment.  Denies worsening.  Continues to follow closely with ophthalmology currently receiving injections in right eye for macular degeneration.  Per wife, ophthalmologist reported improvement of vision post CRAO therefore cleared to restart injections.  Continues ADLs independently but unable to drive due to visual impairment Remains on aspirin 81 mg daily and atorvastatin without side effects currently managed by PCP.  Blood pressure today 130/82.  Denies new or worsening stroke/TIA symptoms or additional visual symptoms.   Initial visit 03/11/2020 JM: Since discharge, he has been stable.  He does report right eye blurred vision started improving approximately 2 to 3 days ago and since that time has been experiencing 15-20 second  dull right upper orbital pain and then will have slightly worsened blurred vision for a short duration but denies worsening vision from prior.  He does plan on follow-up visit tomorrow with ophthalmologist Dr. Zigmund Daniel.  He does report initial difficulty with depth perception and ambulation due to likely combination of visual impairment and lower back pain.  He is currently ambulating with cane and denies any recent fall.  It was recommended to participate in home health therapies but unfortunately they had difficulty with obtaining orders therefore therapy was not pursued.  He has continued on aspirin 81 mg daily and atorvastatin for secondary stroke prevention without side effects.  Blood pressure today 134/68.  He did have follow-up with [facility] Dr. Trula Slade who recommended repeat carotid ultrasound in 6 to 9 months.  No further concerns at this time.  Hospital admission summary 02/04/2020:  Mr. HAGAN VANAUKEN is a 87 y.o. male with history of lumbar stenosis, hypertension, hyperlipidemia, DVT of lower extremity presented on 02/04/2020 after waking with painless OD vision loss.  Evaluated by stroke team and Dr. 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  He underwent right carotid endarterectomy without complication for symptomatic moderate proximal right carotid stenosis.  Recommended continuation of aspirin 81 mg daily and initiated atorvastatin 40 mg daily.  History of HTN and HLD.  He was discharged home in stable condition with recommendation of home health therapy.  CRAO OD, likely thromboembolic from unstable R ICA stenosis    CT head No acute abnormality. Small vessel disease. Atrophy.     MRI  No acute abnormality. Small vessel disease.  CTA head no significant stenosis  CTA neck proximal R ICA 58% stenosis w/ mixed atherosclerosis   Carotid Doppler  R ICA 40-59% stenosis   2D Echo ejection fraction 60 to 65%.  No cardiac source of embolism.    LDL  128 -initiated atorvastatin 40 mg daily  HgbA1c 5.4  Lovenox 40 mg sq daily for VTE prophylaxis  No antithrombotic prior to admission, now on aspirin 324 mg daily. Change to aspirin 81 and plavix 75 my daily. Continue DAPT x 3 weeks then aspirin alone    Therapy recommendations:  HH PT and likely HH OT  Disposition:  Return home  [facility] for d/c today      ROS:   14 system review of systems performed and negative with exception of blurred vision  PMH:  Past Medical History:  Diagnosis Date  . Arthritis   . BPH (benign prostatic hypertrophy)   . Carotid stenosis sx done feb 2021   right   . 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 (HCC)    Hx: of after 2013 back surgery  . GERD (gastroesophageal reflux disease)   . Hearing aid worn    Hx: of right ear  . Hyperlipemia   . Hypertension   . Lumbar herniated disc   . Lumbar stenosis   . Macular degeneration right eye dry  . PONV (postoperative nausea and vomiting)    takes iv nausea meds   . Seasonal allergies    Hx: of    PSH:  Past Surgical History:  Procedure Laterality Date  . BACK SURGERY  2013, x 3 with dr Ronnald Ramp 2014, 2016 and 2019   x 4, takes back injections monthly  . COLONOSCOPY     Hx: of  . ENDARTERECTOMY Right 02/07/2020   Procedure: ENDARTERECTOMY CAROTID;  Surgeon: Serafina Mitchell, MD;  Location: [facility];  Service: Vascular;  Laterality: Right;  . EYE SURGERY     bil catarcts 02/2016  . HERNIA REPAIR  several yrs ago   inguinal  . LUMBAR LAMINECTOMY  10/26/2012   Procedure: MICRODISCECTOMY LUMBAR LAMINECTOMY;  Surgeon: Marybelle Killings, MD;  Location: [facility];  Service: Orthopedics;  Laterality: Right;  Right L4-5 Microdiscectomy  . MAXIMUM ACCESS (MAS)POSTERIOR LUMBAR INTERBODY FUSION (PLIF) 1 LEVEL N/A 04/15/2015   Procedure: Maximum Access Surgery Posterior Lumbar Interbody Fusion Lumbar two-Lumbar three extension of hardware;  Surgeon: Eustace Moore, MD;  Location: [facility];  Service: Neurosurgery;   Laterality: N/A;  . PATCH ANGIOPLASTY Right 02/07/2020   Procedure: PATCH ANGIOPLASTY USING Rueben Bash BIOLOGIC PATCH;  Surgeon: Serafina Mitchell, MD;  Location: [facility];  Service: Vascular;  Laterality: Right;  . ROTATOR CUFF REPAIR     right shoulder - 3 times  . TONSILLECTOMY  as child  . TRANSURETHRAL RESECTION OF BLADDER TUMOR N/A 02/24/2017   Procedure: TRANSURETHRAL RESECTION OF BLADDER TUMOR (TURBT);  Surgeon: Alexis Frock, MD;  Location: [facility];  Service: Urology;  Laterality: N/A;  . TRANSURETHRAL RESECTION OF PROSTATE N/A 11/20/2020   Procedure: TRANSURETHRAL RESECTION OF THE PROSTATE (TURP);  Surgeon: Alexis Frock, MD;  Location: [facility];  Service: Urology;  Laterality: N/A;    Social History:  Social History   Socioeconomic History  . Marital status: Married    Spouse name: Not on file  . Number of children: Not on file  . Years of education: Not on file  . Highest education level: Not on file  Occupational History  . Not on  file  Tobacco Use  . Smoking status: Former Smoker    Packs/day: 3.00    Years: 5.00    Pack years: 15.00    Types: Cigarettes    Quit date: 11/12/1963    Years since quitting: 57.2  . Smokeless tobacco: Never Used  Vaping Use  . Vaping Use: Never used  Substance and Sexual Activity  . Alcohol use: No  . Drug use: No  . Sexual activity: Yes  Other Topics Concern  . Not on file  Social History Narrative  . Not on file   Social Determinants of Health   Financial Resource Strain: Not on file  Food Insecurity: Not on file  Transportation Needs: Not on file  Physical Activity: Not on file  Stress: Not on file  Social Connections: Not on file  Intimate Partner Violence: Not on file    Family History:  Family History  Problem Relation Age of Onset  . Stroke Mother   . Cancer - Prostate Father     Medications:   Current Outpatient Medications on File Prior to Visit  Medication Sig Dispense Refill  .  acetaminophen (TYLENOL) 500 MG tablet Take 1,000 mg by mouth every 6 (six) hours as needed.    [name] aspirin EC 81 MG tablet Take 81 mg by mouth daily. Swallow whole.    [name] atorvastatin (LIPITOR) 40 MG tablet Take 1 tablet (40 mg total) by mouth daily at 6 PM. 90 tablet 3  . Chlorpheniramine Maleate (ALLERGY PO) Take by mouth.    . finasteride (PROSCAR) 5 MG tablet Take 5 mg by mouth at bedtime.     . hydrochlorothiazide (HYDRODIURIL) 50 MG tablet Take 50 mg by mouth at bedtime.     . Liniments (SALONPAS PAIN RELIEF PATCH EX) Place 1 patch onto the skin daily as needed (to affected area for pain).     [name] lisinopril (PRINIVIL,ZESTRIL) 20 MG tablet Take 20 mg by mouth at bedtime.     . Multiple Vitamins-Minerals (PRESERVISION AREDS 2 PO) Take 1 capsule by mouth 2 (two) times daily.     . naproxen sodium (ALEVE) 220 MG tablet Take 220 mg by mouth. 440 mg    . Omega-3 Fatty Acids (FISH OIL) 1200 MG CAPS Take 1,200 mg by mouth daily with breakfast.     . omeprazole (PRILOSEC) 40 MG capsule Take 1 capsule (40 mg total) by mouth every evening. 30 capsule 6  . ondansetron (ZOFRAN) 8 MG tablet Take by mouth every 8 (eight) hours as needed for nausea or vomiting.    [name] SYSTANE 0.4-0.3 % SOLN Place 1 drop into both eyes in the morning, at noon, and at bedtime.    . Tamsulosin HCl (FLOMAX) 0.4 MG CAPS Take 0.4 mg by mouth at bedtime.     [name] tiZANidine (ZANAFLEX) 4 MG tablet Take 1 tablet (4 mg total) by mouth every 8 (eight) hours as needed for muscle spasms. (Patient taking differently: Take 4 mg by mouth at bedtime as needed for muscle spasms.) 60 tablet 1  . traMADol (ULTRAM) 50 MG tablet Take 1-2 tablets (50-100 mg total) by mouth every 6 (six) hours as needed for moderate pain or severe pain. Post-operatively 20 tablet 0  . trimethoprim-polymyxin b (POLYTRIM) ophthalmic solution Place 1 drop into the right eye See admin instructions. INSTILL 1 DROP INTO THE RIGHT EYE 4 TIMES DAILY FOR 2 DAYS AFTER EACH MONTHLY EYE  INJECTION    . Turmeric (QC TUMERIC COMPLEX PO) Take 1 capsule  by mouth daily with breakfast.      No current facility-administered medications on file prior to visit.    Allergies:  No Known Allergies   Physical Exam  Vitals:   01/27/21 0931  BP: 140/68  Pulse: 63  Weight: 198 lb (89.8 kg)  Height: 5\' 9"  (1.753 m)   Body mass index is 29.24 kg/m. No exam data present  General: well developed, well nourished, pleasant elderly Caucasian male, seated, in no evident distress Neck: supple with no carotid or supraclavicular bruits Cardiovascular: regular rate and rhythm, no murmurs Vascular:  Normal pulses all extremities   Neurologic Exam Mental Status: Awake and fully alert. Fluent speech and language. Oriented to place and time. Recent and remote memory intact. Attention span, concentration and fund of knowledge appropriate. Mood and affect appropriate.  Cranial Nerves: Pupils equal, briskly reactive to light. Extraocular movements full without nystagmus. Visual fields full to confrontation OS and OD superior and inferior nasal and inferior temporal visual loss and decreased vision superior temporal.  HOH bilaterally. Facial sensation intact. Face, tongue, palate moves normally and symmetrically.  Motor: Normal bulk and tone. Normal strength in all tested extremity muscles. Sensory.: intact to touch , pinprick , position and vibratory sensation.  Coordination: Rapid alternating movements normal in all extremities. Finger-to-nose and heel-to-shin performed accurately bilaterally. Gait and Station: Arises from chair without difficulty. Stance is normal. Gait demonstrates normal stride length and balance without use of assistive device Reflexes: 1+ and symmetric. Toes downgoing.        ASSESSMENT/PLAN: LUCIOUS ZOU is a 87 y.o. year old male presented with painless OD vision loss on 02/04/2020 with CRAO OD likely thromboembolic, unstable right ICA stenosis s/p right carotid  endarterectomy. Vascular risk factors include HTN, HLD and history of provoked RLE DVT.      1. CRAO OD :  a. Residual deficits: OD visual impairment -stable b. Continue aspirin 81 mg daily  and atorvastatin for secondary stroke prevention.  c. Discussed secondary stroke prevention measures and importance of close PCP follow-up for aggressive stroke risk factor management 2. Right ICA stenosis s/p CEA:  a. Carotid duplex 01/11/2021 right ICA no stenosis and left ICA 1 to 39% stenosis b. Monitored per Dr. Trula Slade with plans on repeating carotid ultrasound in 1 year 3. HTN: a. BP goal<130/90. Well-controlled on hydrochlorothiazide and lisinopril per PCP 4. HLD:  a. LDL goal<70. On atorvastatin 40 mg daily per PCP.  No recent lipid panel - will repeat today.  Request ongoing lipid panel monitoring and atorvastatin prescribing per PCP   Follow-up in 6 months or call earlier if needed  CC:  [facility] provider: Dr. Kristin Bruins, Curt Jews, MD    I spent 30 minutes of face-to-face and non-face-to-face time with patient.  This included previsit chart review, lab review, study review, order entry, electronic health record documentation, patient education regarding history of OD CRAO, right ICA stenosis s/p CEA, residual deficits and macular degeneration, importance of managing stroke risk factors and answered all questions to patient satisfaction  Frann Rider, [facility]  [facility] [address] [place], [place] [address]  Phone [phone] Fax [phone] Note: This document was prepared with digital dictation and possible smart phrase technology. Any transcriptional errors that result from this process are unintentional.

## 2021-01-28 ENCOUNTER — Telehealth: Payer: Self-pay | Admitting: Neurology

## 2021-01-28 LAB — LIPID PANEL
Chol/HDL Ratio: 4.2 ratio (ref 0.0–5.0)
Cholesterol, Total: 122 mg/dL (ref 100–199)
HDL: 29 mg/dL — ABNORMAL LOW (ref >39)
LDL Chol Calc (NIH): 57 mg/dL (ref 0–99)
Triglycerides: 222 mg/dL — ABNORMAL HIGH (ref 0–149)
VLDL Cholesterol Cal: 36 mg/dL (ref 5–40)

## 2021-01-28 NOTE — Telephone Encounter (Signed)
Called the patient and was able to review the lab results.  Informed him of the findings and advised the patient to reach out to following up with his primary care physician in regards to treatment options.  Confirmed patient's primary care is still Dr. Arelia Sneddon and informed the patient a copy of the lab results will be sent to the doctor.  Patient verbalized understanding and had no further questions.

## 2021-01-28 NOTE — Telephone Encounter (Signed)
-----   Message from Frann Rider, NP sent at 01/28/2021  9:12 AM EST ----- Please advise patient that recent lipid panel shows satisfactory LDL at 57 with goal of less than 70.  Triglycerides remain elevated and recommend follow-up with PCP for further treatment options

## 2021-02-25 ENCOUNTER — Other Ambulatory Visit: Payer: Self-pay | Admitting: Student

## 2021-02-25 DIAGNOSIS — M5410 Radiculopathy, site unspecified: Secondary | ICD-10-CM

## 2021-03-06 ENCOUNTER — Other Ambulatory Visit: Payer: Self-pay

## 2021-03-06 ENCOUNTER — Ambulatory Visit
Admission: RE | Admit: 2021-03-06 | Discharge: 2021-03-06 | Disposition: A | Discharge status: Home / Self Care | Payer: Medicare Other | Source: Ambulatory Visit | Attending: Student | Admitting: Student

## 2021-03-06 DIAGNOSIS — M5410 Radiculopathy, site unspecified: Secondary | ICD-10-CM

## 2021-03-12 ENCOUNTER — Other Ambulatory Visit: Payer: Self-pay | Admitting: Adult Health

## 2021-03-16 ENCOUNTER — Other Ambulatory Visit: Payer: Self-pay

## 2021-03-16 ENCOUNTER — Telehealth: Payer: Self-pay | Admitting: Adult Health

## 2021-03-16 IMAGING — CT CT ANGIO NECK
1 of 8 series · 13 of 46 positions shown, 18 images · IV contrast (OMNI)
Comparison: None.

CLINICAL DATA: Stroke follow-up

EXAM:
CT ANGIOGRAPHY HEAD AND NECK
TECHNIQUE: Multidetector CT imaging of the head and neck was performed using
the standard protocol during bolus administration of intravenous
contrast. Multiplanar CT image reconstructions and MIPs were
obtained to evaluate the vascular anatomy. Carotid stenosis
measurements (when applicable) are obtained utilizing NASCET
criteria, using the distal internal carotid diameter as the
denominator.
CONTRAST:  100mL OMNIPAQUE IOHEXOL 350 MG/ML SOLN

[Series 6: thin · axial · 0.55mm/px · z∈[-442,-98]mm · 13 of 787 slices shown, 18 images]
[im 50/787  soft-tissue]
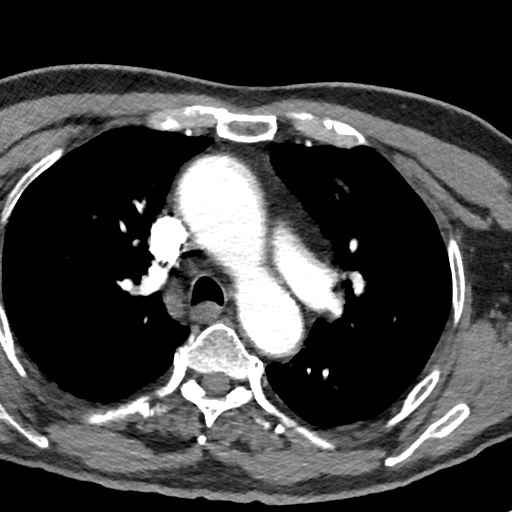
[im 50/787  bone]
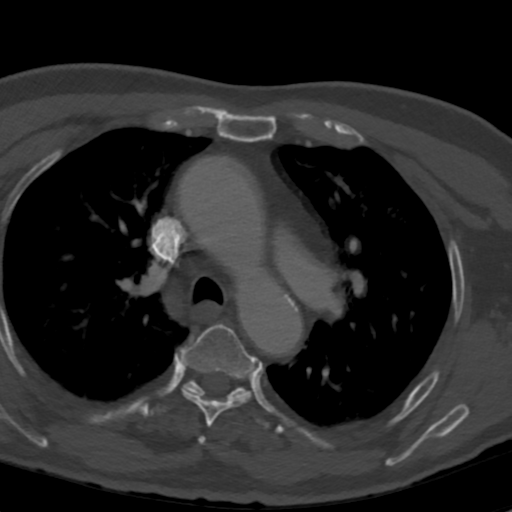
[im 99/787  soft-tissue]
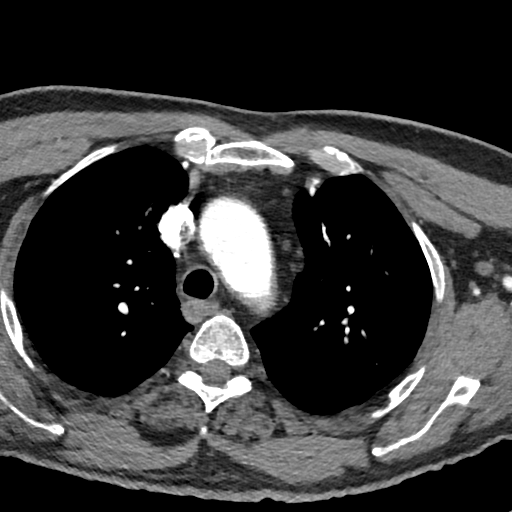
[im 197/787  soft-tissue]
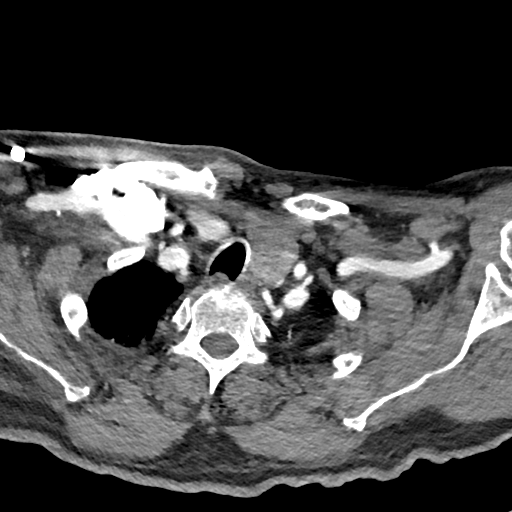
[im 246/787  soft-tissue]
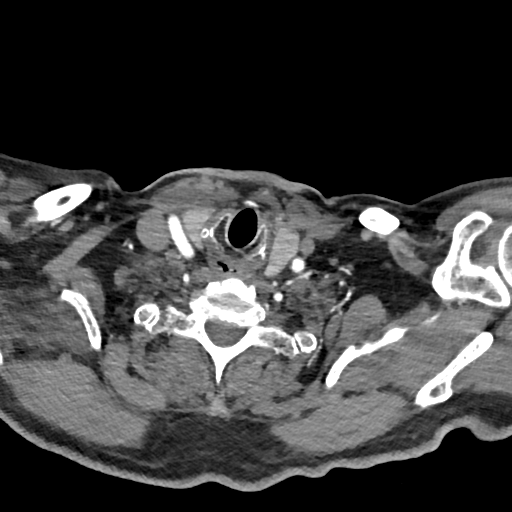
[im 295/787  soft-tissue]
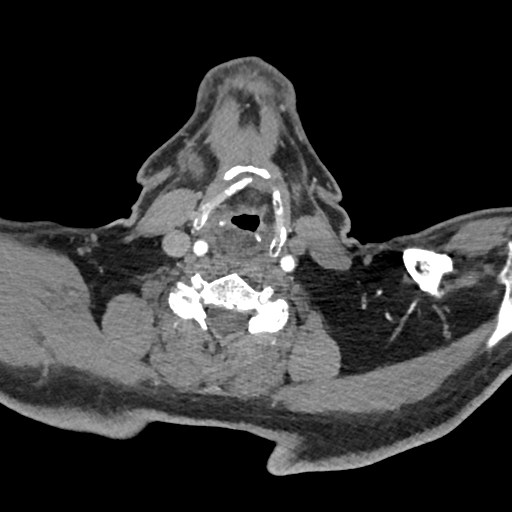
[im 344/787  soft-tissue]
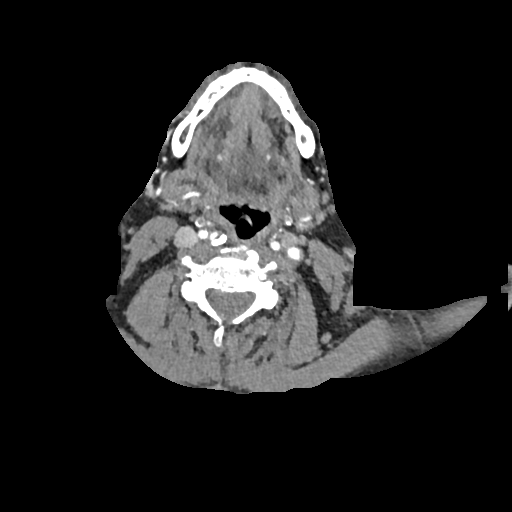
[im 443/787  soft-tissue]
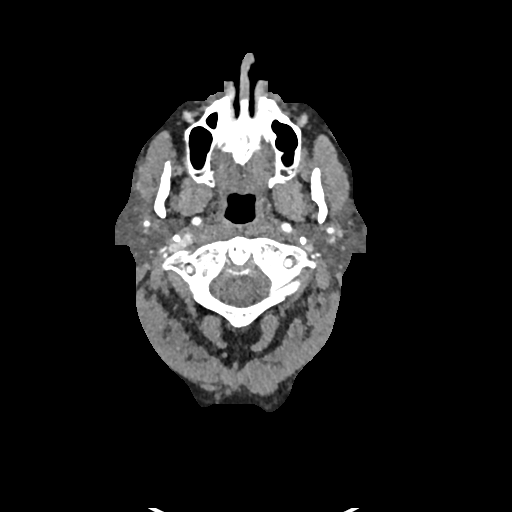
[im 492/787  soft-tissue]
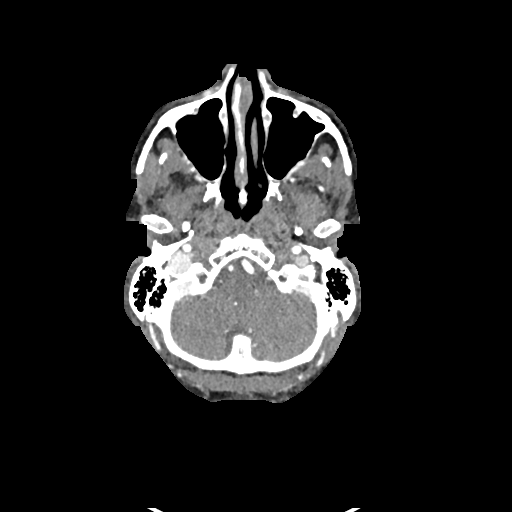
[im 541/787  soft-tissue]
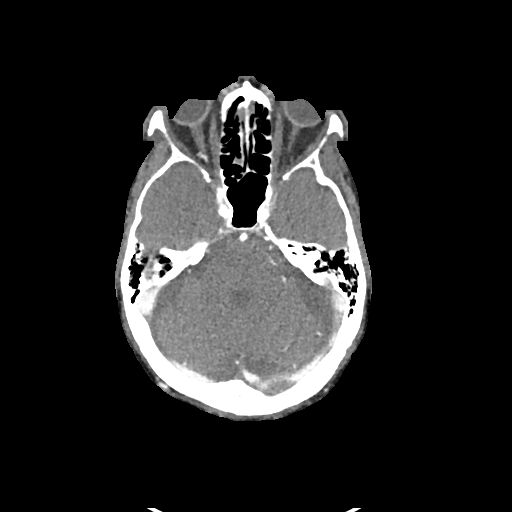
[im 541/787  bone]
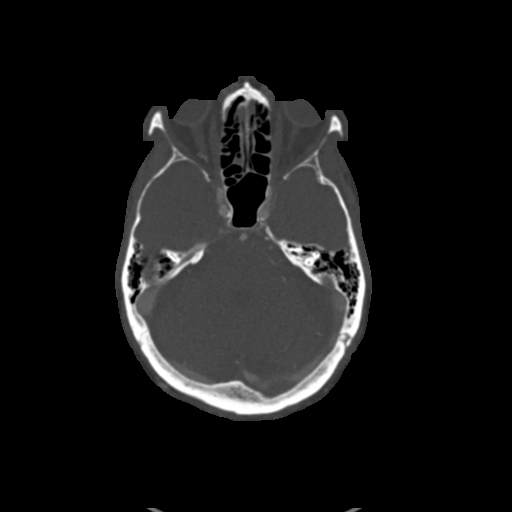
[im 590/787  soft-tissue]
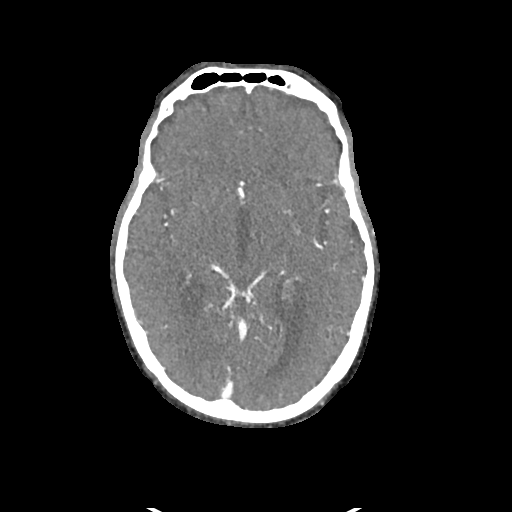
[im 590/787  lung]
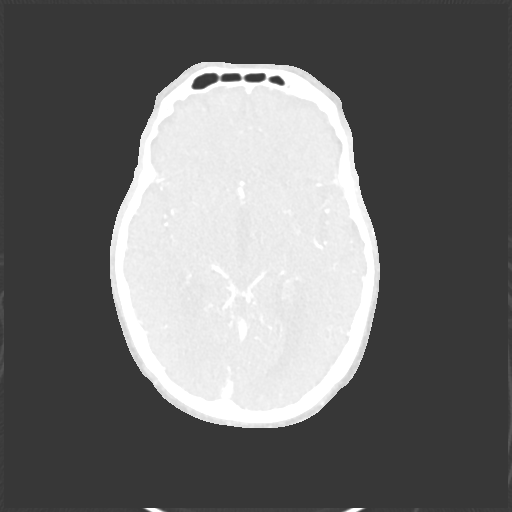
[im 639/787  lung]
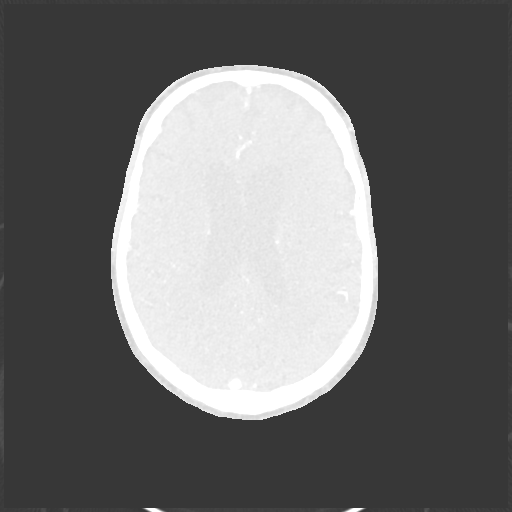
[im 688/787  soft-tissue]
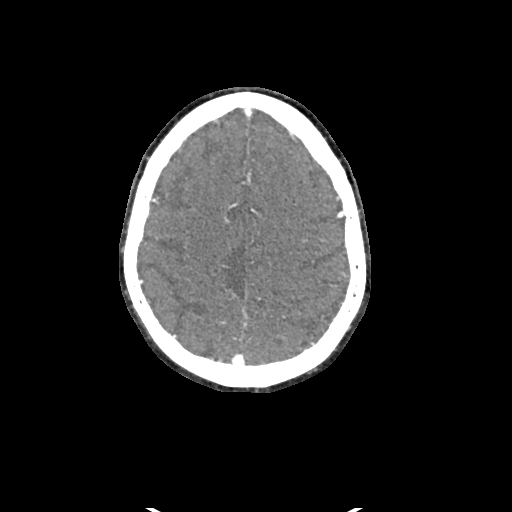
[im 688/787  lung]
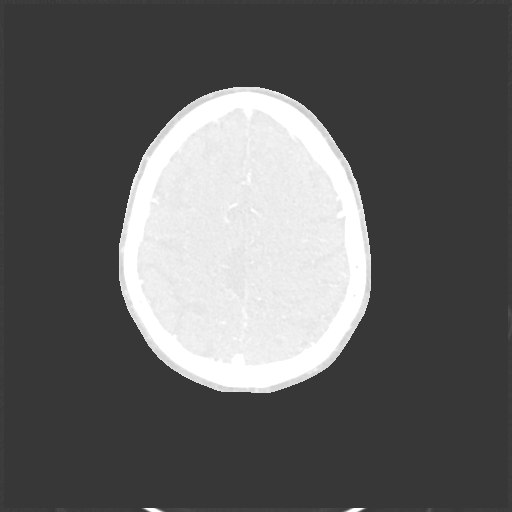
[im 737/787  soft-tissue]
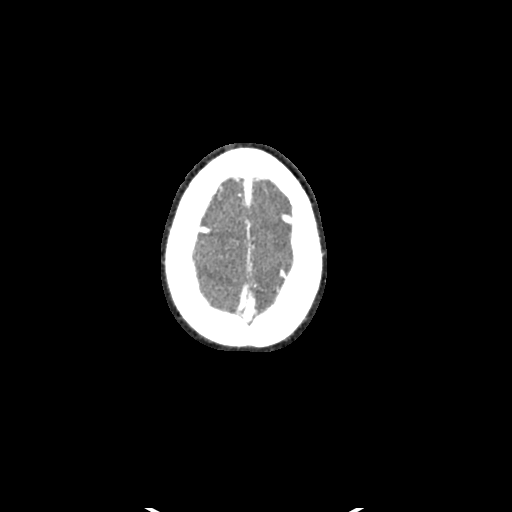
[im 737/787  lung]
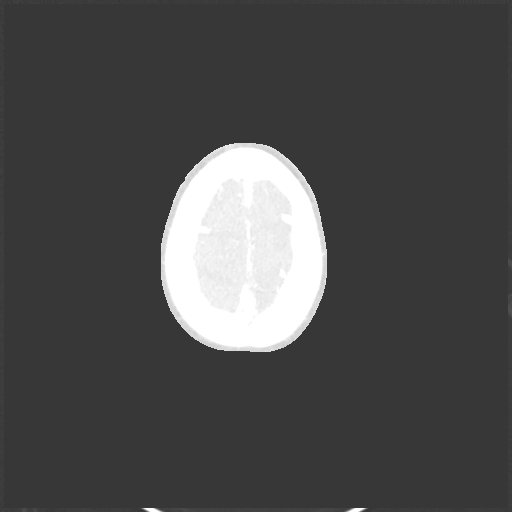

[13 of 46 positions shown; findings below may reference images not displayed]

FINDINGS: CTA NECK FINDINGS

SKELETON: 1

OTHER NECK: Normal pharynx, larynx and major salivary glands. No
cervical lymphadenopathy. Enlarged left lobe of the thyroid gland.

UPPER CHEST: No pneumothorax or pleural effusion. No nodules or
masses.

AORTIC ARCH:

There is no calcific atherosclerosis of the aortic arch. There is no
aneurysm, dissection or hemodynamically significant stenosis of the
visualized portion of the aorta. Conventional 3 vessel aortic
branching pattern. The visualized proximal subclavian arteries are
widely patent.

RIGHT CAROTID SYSTEM: No dissection, occlusion or aneurysm. There is
mixed density atherosclerosis extending into the proximal ICA,
resulting in 58% stenosis.

LEFT CAROTID SYSTEM: No dissection, occlusion or aneurysm. Mild
atherosclerotic calcification at the carotid bifurcation without
hemodynamically significant stenosis.

VERTEBRAL ARTERIES: Left dominant configuration. Both origins are
clearly patent. There is no dissection, occlusion or flow-limiting
stenosis to the skull base (V1-V3 segments).

CTA HEAD FINDINGS

POSTERIOR CIRCULATION:

--Vertebral arteries: Normal V4 segments.

--Posterior inferior cerebellar arteries (PICA): Patent origins from
the vertebral arteries.

--Anterior inferior cerebellar arteries (AICA): Patent origins from
the basilar artery.

--Basilar artery: Normal.

--Superior cerebellar arteries: Normal.

--Posterior cerebral arteries: Normal. Both originate from the
basilar artery. Posterior communicating arteries (p-comm) are
diminutive or absent.

ANTERIOR CIRCULATION:

--Intracranial internal carotid arteries: Normal.

--Anterior cerebral arteries (ACA): Normal. Both A1 segments are
present. Patent anterior communicating artery (a-comm).

--Middle cerebral arteries (MCA): Normal.

VENOUS SINUSES: As permitted by contrast timing, patent.

ANATOMIC VARIANTS: None

Review of the MIP images confirms the above findings.
IMPRESSION: 1. No intracranial arterial occlusion or hemodynamically significant
stenosis.
2. There is 58% stenosis of the proximal right ICA secondary to
mixed density atherosclerosis.
3. Enlarged left thyroid lobe.

## 2021-03-16 NOTE — Telephone Encounter (Signed)
Pt request refill atorvastatin (LIPITOR) 40 MG tablet at [facility]

## 2021-07-06 ENCOUNTER — Emergency Department (HOSPITAL_COMMUNITY)
Admission: EM | Admit: 2021-07-06 | Discharge: 2021-07-07 | Disposition: A | Discharge status: Home / Self Care | Payer: Medicare Other | Attending: Emergency Medicine | Admitting: Emergency Medicine

## 2021-07-06 ENCOUNTER — Encounter (HOSPITAL_COMMUNITY): Payer: Self-pay

## 2021-07-06 ENCOUNTER — Emergency Department (HOSPITAL_COMMUNITY): Payer: Medicare Other

## 2021-07-06 ENCOUNTER — Other Ambulatory Visit: Payer: Self-pay

## 2021-07-06 DIAGNOSIS — E876 Hypokalemia: Secondary | ICD-10-CM

## 2021-07-06 DIAGNOSIS — Z79899 Other long term (current) drug therapy: Secondary | ICD-10-CM | POA: Diagnosis not present

## 2021-07-06 DIAGNOSIS — Z7982 Long term (current) use of aspirin: Secondary | ICD-10-CM | POA: Diagnosis not present

## 2021-07-06 DIAGNOSIS — I10 Essential (primary) hypertension: Secondary | ICD-10-CM | POA: Insufficient documentation

## 2021-07-06 DIAGNOSIS — R531 Weakness: Secondary | ICD-10-CM | POA: Diagnosis not present

## 2021-07-06 DIAGNOSIS — M25551 Pain in right hip: Secondary | ICD-10-CM | POA: Insufficient documentation

## 2021-07-06 DIAGNOSIS — M545 Low back pain, unspecified: Secondary | ICD-10-CM | POA: Insufficient documentation

## 2021-07-06 DIAGNOSIS — E871 Hypo-osmolality and hyponatremia: Secondary | ICD-10-CM

## 2021-07-06 DIAGNOSIS — Z87891 Personal history of nicotine dependence: Secondary | ICD-10-CM | POA: Insufficient documentation

## 2021-07-06 DIAGNOSIS — U071 COVID-19: Secondary | ICD-10-CM

## 2021-07-06 DIAGNOSIS — M549 Dorsalgia, unspecified: Secondary | ICD-10-CM | POA: Diagnosis present

## 2021-07-06 DIAGNOSIS — D649 Anemia, unspecified: Secondary | ICD-10-CM

## 2021-07-06 DIAGNOSIS — G8929 Other chronic pain: Secondary | ICD-10-CM

## 2021-07-06 LAB — CBC WITH DIFFERENTIAL/PLATELET
Abs Immature Granulocytes: 0.02 10*3/uL (ref 0.00–0.07)
Basophils Absolute: 0 10*3/uL (ref 0.0–0.1)
Basophils Relative: 1 %
Eosinophils Absolute: 0.3 10*3/uL (ref 0.0–0.5)
Eosinophils Relative: 5 %
HCT: 36.6 % — ABNORMAL LOW (ref 39.0–52.0)
Hemoglobin: 12.7 g/dL — ABNORMAL LOW (ref 13.0–17.0)
Immature Granulocytes: 0 %
Lymphocytes Relative: 12 %
Lymphs Abs: 0.9 10*3/uL (ref 0.7–4.0)
MCH: 32.1 pg (ref 26.0–34.0)
MCHC: 34.7 g/dL (ref 30.0–36.0)
MCV: 92.4 fL (ref 80.0–100.0)
Monocytes Absolute: 1.1 10*3/uL — ABNORMAL HIGH (ref 0.1–1.0)
Monocytes Relative: 15 %
Neutro Abs: 4.7 10*3/uL (ref 1.7–7.7)
Neutrophils Relative %: 67 %
Platelets: 212 10*3/uL (ref 150–400)
RBC: 3.96 MIL/uL — ABNORMAL LOW (ref 4.22–5.81)
RDW: 13.1 % (ref 11.5–15.5)
WBC: 7.1 10*3/uL (ref 4.0–10.5)
nRBC: 0 % (ref 0.0–0.2)

## 2021-07-06 LAB — URINALYSIS, ROUTINE W REFLEX MICROSCOPIC
Bilirubin Urine: NEGATIVE
Glucose, UA: NEGATIVE mg/dL
Hgb urine dipstick: NEGATIVE
Ketones, ur: NEGATIVE mg/dL
Leukocytes,Ua: NEGATIVE
Nitrite: NEGATIVE
Protein, ur: NEGATIVE mg/dL
Specific Gravity, Urine: 1.014 (ref 1.005–1.030)
pH: 6 (ref 5.0–8.0)

## 2021-07-06 MED ORDER — OXYCODONE-ACETAMINOPHEN 5-325 MG PO TABS
1.0000 | ORAL_TABLET | Freq: Once | ORAL | Status: AC
  Administered 2021-07-06: 1 via ORAL
  Filled 2021-07-06: qty 1

## 2021-07-06 MED ORDER — ONDANSETRON 8 MG PO TBDP
8.0000 mg | ORAL_TABLET | Freq: Once | ORAL | Status: AC
  Administered 2021-07-06: 8 mg via ORAL
  Filled 2021-07-06: qty 1

## 2021-07-06 NOTE — ED Triage Notes (Signed)
Patient BIB [facility] from home. Chronic lower hip and leg pain. Spinal stimulator put in as a trial, taken out yesterday. Pain in the site that travels down his leg that progressively has gotten worse.   184/100 HR 90 98% Room air

## 2021-07-06 NOTE — ED Provider Notes (Signed)
[facility] DEPT Provider Note   CSN: [id number] Arrival date & time: 07/06/21  2046     History Chief Complaint  Patient presents with   Leg Pain    Jay Tran is a 87 y.o. male.  Time into the patient's ER course, I was able to speak with his wife.  At this point, she clarified that the patient has actually had generalized weakness and has been unable to get out of bed and ambulate to the bathroom.  He walked out of clinic yesterday when his spinal stimulator was removed.  However, this afternoon, he became diffusely weak.  The history is provided by the patient.  Back Pain Pain location: right posterior hip. Quality:  Stabbing Radiates to:  Does not radiate Pain severity:  Severe Pain is:  Same all the time Onset quality:  Gradual Duration: chronic pain but worsened x1 day since spinal stimulator was removed. Timing:  Constant Progression:  Worsening Chronicity:  Chronic Context comment:  Spinal stimulator implanted on a trial basis and was removed; permanent one planned but not scheduled Relieved by:  Nothing Worsened by:  Movement Ineffective treatments:  OTC medications (can't take tramadol because it "makes me sick") Associated symptoms: no abdominal pain, no bladder incontinence, no bowel incontinence, no chest pain, no dysuria, no fever, no leg pain, no numbness, no paresthesias, no tingling, no weakness and no weight loss       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 ([place])    Hx: of after 2013 back surgery   GERD (gastroesophageal reflux disease)    Hearing aid worn    Hx: of right ear   Hyperlipemia    Hypertension    Lumbar herniated disc    Lumbar stenosis    Macular degeneration right eye dry   PONV (postoperative nausea and vomiting)    takes iv nausea meds    Seasonal  allergies    Hx: of    Patient Active Problem List   Diagnosis Date Noted   Prostatic hypertrophy 11/20/2020   Internal carotid artery stenosis 02/05/2020   Essential hypertension 02/05/2020   Benign prostatic hyperplasia with lower urinary tract symptoms 02/05/2020   GERD (gastroesophageal reflux disease) 02/05/2020   Chronic back pain 02/05/2020   Vision loss of right eye 02/04/2020   Wound drainage 08/20/2018   Chronic pain of both shoulders 02/22/2017   S/P lumbar spinal fusion 04/15/2015   HNP (herniated nucleus pulposus), lumbar 10/26/2012    Class: Diagnosis of    Past Surgical History:  Procedure Laterality Date   BACK SURGERY  2013, x 3 with dr Ronnald Ramp 2014, 2016 and 2019   x 4, takes back injections monthly   COLONOSCOPY     Hx: of   ENDARTERECTOMY Right 02/07/2020   Procedure: ENDARTERECTOMY CAROTID;  Surgeon: Serafina Mitchell, MD;  Location: [facility];  Service: Vascular;  Laterality: Right;   EYE SURGERY     bil catarcts 02/2016   HERNIA REPAIR  several yrs ago   inguinal   LUMBAR LAMINECTOMY  10/26/2012   Procedure: MICRODISCECTOMY LUMBAR LAMINECTOMY;  Surgeon: Marybelle Killings, MD;  Location: [facility];  Service: Orthopedics;  Laterality: Right;  Right L4-5 Microdiscectomy   MAXIMUM ACCESS (MAS)POSTERIOR LUMBAR INTERBODY FUSION (PLIF) 1 LEVEL N/A 04/15/2015   Procedure: Maximum  Access Surgery Posterior Lumbar Interbody Fusion Lumbar two-Lumbar three extension of hardware;  Surgeon: Eustace Moore, MD;  Location: [facility];  Service: Neurosurgery;  Laterality: N/A;   PATCH ANGIOPLASTY Right 02/07/2020   Procedure: PATCH ANGIOPLASTY USING Rueben Bash BIOLOGIC PATCH;  Surgeon: Serafina Mitchell, MD;  Location: [facility];  Service: Vascular;  Laterality: Right;   ROTATOR CUFF REPAIR     right shoulder - 3 times   TONSILLECTOMY  as child   TRANSURETHRAL RESECTION OF BLADDER TUMOR N/A 02/24/2017   Procedure: TRANSURETHRAL RESECTION OF BLADDER TUMOR (TURBT);  Surgeon: Alexis Frock, MD;   Location: [facility];  Service: Urology;  Laterality: N/A;   TRANSURETHRAL RESECTION OF PROSTATE N/A 11/20/2020   Procedure: TRANSURETHRAL RESECTION OF THE PROSTATE (TURP);  Surgeon: Alexis Frock, MD;  Location: [facility];  Service: Urology;  Laterality: N/A;       Family History  Problem Relation Age of Onset   Stroke Mother    Cancer - Prostate Father     Social History   Tobacco Use   Smoking status: Former    Packs/day: 3.00    Years: 5.00    Pack years: 15.00    Types: Cigarettes    Quit date: 11/12/1963    Years since quitting: 44.6   Smokeless tobacco: Never  Vaping Use   Vaping Use: Never used  Substance Use Topics   Alcohol use: No   Drug use: No    Home Medications Prior to Admission medications   Medication Sig Start Date End Date Taking? Authorizing Provider  acetaminophen (TYLENOL) 500 MG tablet Take 1,000 mg by mouth every 6 (six) hours as needed.    [provider]  aspirin EC 81 MG tablet Take 81 mg by mouth daily. Swallow whole.    [provider]  atorvastatin (LIPITOR) 40 MG tablet TAKE 1 TABLET BY MOUTH ONCE DAILY AT  6  PM 03/16/21   Frann Rider, NP  Chlorpheniramine Maleate (ALLERGY PO) Take by mouth.    [provider]  finasteride (PROSCAR) 5 MG tablet Take 5 mg by mouth at bedtime.     [provider]  hydrochlorothiazide (HYDRODIURIL) 50 MG tablet Take 50 mg by mouth at bedtime.     [provider]  Liniments (SALONPAS PAIN RELIEF PATCH EX) Place 1 patch onto the skin daily as needed (to affected area for pain).     [provider]  lisinopril (PRINIVIL,ZESTRIL) 20 MG tablet Take 20 mg by mouth at bedtime.     [provider]  Multiple Vitamins-Minerals (PRESERVISION AREDS 2 PO) Take 1 capsule by mouth 2 (two) times daily.     [provider]  naproxen sodium (ALEVE) 220 MG tablet Take 220 mg by mouth. 440 mg    [provider]  Omega-3 Fatty Acids  (FISH OIL) 1200 MG CAPS Take 1,200 mg by mouth daily with breakfast.     [provider]  omeprazole (PRILOSEC) 40 MG capsule Take 1 capsule (40 mg total) by mouth every evening. 01/11/21   Rozetta Nunnery, MD  ondansetron (ZOFRAN) 8 MG tablet Take by mouth every 8 (eight) hours as needed for nausea or vomiting.    [provider]  SYSTANE 0.4-0.3 % SOLN Place 1 drop into both eyes in the morning, at noon, and at bedtime.    [provider]  Tamsulosin HCl (FLOMAX) 0.4 MG CAPS Take 0.4 mg by mouth at bedtime.     [provider]  tiZANidine (ZANAFLEX) 4 MG tablet Take 1 tablet (4 mg total) by mouth every 8 (eight) hours as needed for muscle spasms. Patient taking differently: Take 4 mg by mouth at bedtime as needed for muscle spasms. 08/11/18   Eustace Moore, MD  traMADol (ULTRAM) 50 MG tablet Take 1-2 tablets (50-100 mg total) by mouth every 6 (six) hours as needed for moderate pain or severe pain. Post-operatively 11/20/20   Alexis Frock, MD  trimethoprim-polymyxin b (POLYTRIM) ophthalmic solution Place 1 drop into the right eye See admin instructions. INSTILL 1 DROP INTO THE RIGHT EYE 4 TIMES DAILY FOR 2 DAYS AFTER EACH MONTHLY EYE INJECTION 10/14/19   [provider]  Turmeric (QC TUMERIC COMPLEX PO) Take 1 capsule by mouth daily with breakfast.     [provider]    Allergies    Patient has no known allergies.  Review of Systems   Review of Systems  Constitutional:  Negative for chills, fever and weight loss.  HENT:  Negative for ear pain and sore throat.   Eyes:  Negative for pain and visual disturbance.  Respiratory:  Negative for cough and shortness of breath.   Cardiovascular:  Negative for chest pain and palpitations.  Gastrointestinal:  Negative for abdominal pain, bowel incontinence and vomiting.  Genitourinary:  Negative for bladder incontinence, dysuria and hematuria.  Musculoskeletal:  Positive for back pain.  Negative for arthralgias.  Skin:  Negative for color change and rash.  Neurological:  Negative for tingling, seizures, syncope, weakness, numbness and paresthesias.  All other systems reviewed and are negative.  Physical Exam Updated Vital Signs BP (!) 185/78 (BP Location: Right Arm)   Pulse 88   Temp 98.4 F (36.9 C) (Oral)   Resp 19   Ht 5\' 9"  (1.753 m)   Wt 87.1 kg   SpO2 93%   BMI 28.35 kg/m   Physical Exam Vitals and nursing note reviewed.  Constitutional:      Appearance: Normal appearance.  HENT:     Head: Normocephalic and atraumatic.  Eyes:     Conjunctiva/sclera: Conjunctivae normal.  Pulmonary:     Effort: Pulmonary effort is normal. No respiratory distress.  Musculoskeletal:        General: No deformity. Normal range of motion.     Cervical back: Normal range of motion.       Back:     Comments: Tender over the right iliac crest. Painful ROM.  Skin:    General: Skin is warm and dry.  Neurological:     General: No focal deficit present.     Mental Status: He is alert and oriented to person, place, and time. Mental status is at baseline.     Sensory: Sensation is intact.     Motor: No weakness.     Comments: I reassessed the patient, and he was able to pull himself up in bed.  He was able to move his lower extremities around, but he stated that he felt too weak to stand.  I tried to clarify whether or not this was from pain or generalized weakness.  He did not appear to be in pain when attempting to move around.  It seemed to be much more consistent with a generalized weakness.  Psychiatric:        Mood and Affect: Mood normal.    ED Results / Procedures / Treatments   Labs (all labs ordered are listed, but only abnormal results are displayed) Labs Reviewed  RESP PANEL  BY RT-PCR (FLU A&B, COVID) ARPGX2 - Abnormal; Notable for the following components:      Result Value   SARS Coronavirus 2 by RT PCR POSITIVE (*)    All other components within normal  limits  COMPREHENSIVE METABOLIC PANEL - Abnormal; Notable for the following components:   Sodium 130 (*)    Potassium 3.3 (*)    Chloride 91 (*)    Glucose, Bld 109 (*)    All other components within normal limits  CBC WITH DIFFERENTIAL/PLATELET - Abnormal; Notable for the following components:   RBC 3.96 (*)    Hemoglobin 12.7 (*)    HCT 36.6 (*)    Monocytes Absolute 1.1 (*)    All other components within normal limits  URINALYSIS, ROUTINE W REFLEX MICROSCOPIC  TROPONIN I (HIGH SENSITIVITY)  TROPONIN I (HIGH SENSITIVITY)    EKG EKG Interpretation  Date/Time:  Tuesday July 06 2021 23:38:53 EDT Ventricular Rate:  71 PR Interval:  192 QRS Duration: 104 QT Interval:  420 QTC Calculation: 457 R Axis:   -33 Text Interpretation: Sinus rhythm Supraventricular bigeminy Left axis deviation RSR' in V1 or V2, right VCD or RVH No acute ischemia Confirmed by Lorre Munroe ([id number]) on 07/06/2021 11:47:30 PM  Radiology DG Lumbar Spine Complete  Result Date: 07/06/2021 CLINICAL DATA:  Low back pain.  Pain stimulator taken out yesterday. EXAM: LUMBAR SPINE - COMPLETE 4+ VIEW COMPARISON:  X-ray lumbar spine 12/21/2017, CT lumbar spine 03/06/2021 FINDINGS: Interbody fusion at the L1-L2, L2-L3, L3-L4, L4-L5 levels. Posterolateral fusion at the L1 through L3 levels. Surgical hardware appears in grossly similar position with redemonstration of the inter-pedicular screws at the L1 level extending cranial to the superior endplate into the F75-Z0 intervertebral disc space. Multilevel degenerative changes of the spine. Similar-appearing slight vertebral body height loss of the T12 level. Similar findings of the L5 level. Here is no evidence of lumbar spine fracture. Atherosclerotic plaque. IMPRESSION: 1. No acute displaced fracture or traumatic listhesis of the lumbar spine in a patient with L1 through L5 surgical changes as described above. 2.  Aortic Atherosclerosis (ICD10-I70.0). Electronically Signed   By:  Iven Finn M.D.   On: 07/06/2021 21:49   DG Chest Port 1 View  Result Date: 07/06/2021 CLINICAL DATA:  Shortness of breath EXAM: PORTABLE CHEST 1 VIEW COMPARISON:  08/09/2018 FINDINGS: The heart size and mediastinal contours are within normal limits. Both lungs are clear. The visualized skeletal structures are unremarkable. IMPRESSION: No active disease. Electronically Signed   By: Donavan Foil M.D.   On: 07/06/2021 23:30    Procedures Procedures   Medications Ordered in ED Medications  oxyCODONE-acetaminophen (PERCOCET/ROXICET) 5-325 MG per tablet 1 tablet (1 tablet Oral Given 07/06/21 2118)  ondansetron (ZOFRAN-ODT) disintegrating tablet 8 mg (8 mg Oral Given 07/06/21 2118)    ED Course  I have reviewed the triage vital signs and the nursing notes.  Pertinent labs & imaging results that were available during my care of the patient were reviewed by me and considered in my medical decision making (see chart for details).  Clinical Course as of 07/08/21 0033  Tue Jul 06, 2021  2316 Initial ED work-up was focused on treating his pain.  I also wanted to make sure he had no acute changes on his lumbar spine film.  However, based on further history obtained by his wife, I realized that the patient was actually diffusely weak.  At this point, his work-up was expanded to include additional labs and imaging. [AW]  Clinical Course User Index [AW] Arnaldo Natal, MD   MDM Rules/Calculators/A&P                           Leane Para Semmel presented with acute on chronic low back pain and weakness.  Initially, I thought this was simply a pain control situation, but further into his ED course, it became clear that his symptoms were really generalized weakness.  He is currently awaiting labs to evaluate for infection, ACS, metabolic derangement. Final Clinical Impression(s) / ED Diagnoses Final diagnoses:  Weakness  Chronic bilateral low back pain without sciatica    Rx / DC Orders ED  Discharge Orders     None        Arnaldo Natal, MD 07/08/21 [phone]

## 2021-07-07 DIAGNOSIS — M545 Low back pain, unspecified: Secondary | ICD-10-CM | POA: Diagnosis not present

## 2021-07-07 LAB — COMPREHENSIVE METABOLIC PANEL
ALT: 5 U/L (ref 0–44)
AST: 25 U/L (ref 15–41)
Albumin: 4.2 g/dL (ref 3.5–5.0)
Alkaline Phosphatase: 64 U/L (ref 38–126)
Anion gap: 10 (ref 5–15)
BUN: 13 mg/dL (ref 8–23)
CO2: 29 mmol/L (ref 22–32)
Calcium: 8.9 mg/dL (ref 8.9–10.3)
Chloride: 91 mmol/L — ABNORMAL LOW (ref 98–111)
Creatinine, Ser: 0.73 mg/dL (ref 0.61–1.24)
GFR, Estimated: >60 mL/min (ref >60)
Glucose, Bld: 109 mg/dL — ABNORMAL HIGH (ref 70–99)
Potassium: 3.3 mmol/L — ABNORMAL LOW (ref 3.5–5.1)
Sodium: 130 mmol/L — ABNORMAL LOW (ref 135–145)
Total Bilirubin: 1.1 mg/dL (ref 0.3–1.2)
Total Protein: 7.2 g/dL (ref 6.5–8.1)

## 2021-07-07 LAB — RESP PANEL BY RT-PCR (FLU A&B, COVID) ARPGX2
Influenza A by PCR: NEGATIVE
Influenza B by PCR: NEGATIVE
SARS Coronavirus 2 by RT PCR: POSITIVE — AB

## 2021-07-07 LAB — TROPONIN I (HIGH SENSITIVITY)
Troponin I (High Sensitivity): 13 ng/L (ref <18)
Troponin I (High Sensitivity): 14 ng/L (ref <18)

## 2021-07-07 MED ORDER — PAXLOVID 10 X 150 MG & 10 X 100MG PO TBPK: 2.0000 | ORAL_TABLET | Freq: Two times a day (BID) | ORAL | 0 refills | Status: AC

## 2021-07-07 MED ORDER — POTASSIUM CHLORIDE CRYS ER 20 MEQ PO TBCR: EXTENDED_RELEASE_TABLET | ORAL | 0 refills | Status: DC

## 2021-07-07 MED ORDER — POTASSIUM CHLORIDE CRYS ER 20 MEQ PO TBCR
40.0000 meq | EXTENDED_RELEASE_TABLET | Freq: Once | ORAL | Status: AC
  Administered 2021-07-07: 40 meq via ORAL
  Filled 2021-07-07: qty 2

## 2021-07-07 NOTE — ED Provider Notes (Signed)
Care assumed from Dr. Joya Gaskins with generalized weakness, respiratory pathogen panel and CMP are pending. He will probably need to be admitted.  Metabolic panel is significant for mild hypokalemia and hyponatremia.  He is given a dose of oral potassium.  Respiratory pathogen panel is positive for COVID-19.  He will be discharged with prescription for Paxlovid and K-Dur, follow-up with PCP.  Results for orders placed or performed during the hospital encounter of 07/06/21  Resp Panel by RT-PCR (Flu A&B, Covid) Nasopharyngeal Swab   Specimen: Nasopharyngeal Swab; Nasopharyngeal(NP) swabs in vial transport medium  Result Value Ref Range   SARS Coronavirus 2 by RT PCR POSITIVE (A) NEGATIVE   Influenza A by PCR NEGATIVE NEGATIVE   Influenza B by PCR NEGATIVE NEGATIVE  Comprehensive metabolic panel  Result Value Ref Range   Sodium 130 (L) 135 - 145 mmol/L   Potassium 3.3 (L) 3.5 - 5.1 mmol/L   Chloride 91 (L) 98 - 111 mmol/L   CO2 29 22 - 32 mmol/L   Glucose, Bld 109 (H) 70 - 99 mg/dL   BUN 13 8 - 23 mg/dL   Creatinine, Ser 0.73 0.61 - 1.24 mg/dL   Calcium 8.9 8.9 - 10.3 mg/dL   Total Protein 7.2 6.5 - 8.1 g/dL   Albumin 4.2 3.5 - 5.0 g/dL   AST 25 15 - 41 U/L   ALT 5 0 - 44 U/L   Alkaline Phosphatase 64 38 - 126 U/L   Total Bilirubin 1.1 0.3 - 1.2 mg/dL   GFR, Estimated >60 >60 mL/min   Anion gap 10 5 - 15  CBC with Differential  Result Value Ref Range   WBC 7.1 4.0 - 10.5 K/uL   RBC 3.96 (L) 4.22 - 5.81 MIL/uL   Hemoglobin 12.7 (L) 13.0 - 17.0 g/dL   HCT 36.6 (L) 39.0 - 52.0 %   MCV 92.4 80.0 - 100.0 fL   MCH 32.1 26.0 - 34.0 pg   MCHC 34.7 30.0 - 36.0 g/dL   RDW 13.1 11.5 - 15.5 %   Platelets 212 150 - 400 K/uL   nRBC 0.0 0.0 - 0.2 %   Neutrophils Relative % 67 %   Neutro Abs 4.7 1.7 - 7.7 K/uL   Lymphocytes Relative 12 %   Lymphs Abs 0.9 0.7 - 4.0 K/uL   Monocytes Relative 15 %   Monocytes Absolute 1.1 (H) 0.1 - 1.0 K/uL   Eosinophils Relative 5 %   Eosinophils Absolute 0.3  0.0 - 0.5 K/uL   Basophils Relative 1 %   Basophils Absolute 0.0 0.0 - 0.1 K/uL   Immature Granulocytes 0 %   Abs Immature Granulocytes 0.02 0.00 - 0.07 K/uL  Urinalysis, Routine w reflex microscopic Nasopharyngeal Swab  Result Value Ref Range   Color, Urine YELLOW YELLOW   APPearance CLEAR CLEAR   Specific Gravity, Urine 1.014 1.005 - 1.030   pH 6.0 5.0 - 8.0   Glucose, UA NEGATIVE NEGATIVE mg/dL   Hgb urine dipstick NEGATIVE NEGATIVE   Bilirubin Urine NEGATIVE NEGATIVE   Ketones, ur NEGATIVE NEGATIVE mg/dL   Protein, ur NEGATIVE NEGATIVE mg/dL   Nitrite NEGATIVE NEGATIVE   Leukocytes,Ua NEGATIVE NEGATIVE  Troponin I (High Sensitivity)  Result Value Ref Range   Troponin I (High Sensitivity) 13 <18 ng/L   DG Lumbar Spine Complete  Result Date: 07/06/2021 CLINICAL DATA:  Low back pain.  Pain stimulator taken out yesterday. EXAM: LUMBAR SPINE - COMPLETE 4+ VIEW COMPARISON:  X-ray lumbar spine 12/21/2017, CT lumbar  spine 03/06/2021 FINDINGS: Interbody fusion at the L1-L2, L2-L3, L3-L4, L4-L5 levels. Posterolateral fusion at the L1 through L3 levels. Surgical hardware appears in grossly similar position with redemonstration of the inter-pedicular screws at the L1 level extending cranial to the superior endplate into the Y10-F7 intervertebral disc space. Multilevel degenerative changes of the spine. Similar-appearing slight vertebral body height loss of the T12 level. Similar findings of the L5 level. Here is no evidence of lumbar spine fracture. Atherosclerotic plaque. IMPRESSION: 1. No acute displaced fracture or traumatic listhesis of the lumbar spine in a patient with L1 through L5 surgical changes as described above. 2.  Aortic Atherosclerosis (ICD10-I70.0). Electronically Signed   By: Iven Finn M.D.   On: 07/06/2021 21:49   DG Chest Port 1 View  Result Date: 07/06/2021 CLINICAL DATA:  Shortness of breath EXAM: PORTABLE CHEST 1 VIEW COMPARISON:  08/09/2018 FINDINGS: The heart size  and mediastinal contours are within normal limits. Both lungs are clear. The visualized skeletal structures are unremarkable. IMPRESSION: No active disease. Electronically Signed   By: Donavan Foil M.D.   On: 51/01/5851 77:82     Delora Fuel, MD 42/35/36 [phone]

## 2021-07-07 NOTE — Discharge Instructions (Addendum)
Return to the emergency department if symptoms are getting worse.

## 2021-07-26 ENCOUNTER — Other Ambulatory Visit: Payer: Self-pay

## 2021-07-26 ENCOUNTER — Encounter (INDEPENDENT_AMBULATORY_CARE_PROVIDER_SITE_OTHER): Payer: Medicare Other | Admitting: Ophthalmology

## 2021-07-26 DIAGNOSIS — H43813 Vitreous degeneration, bilateral: Secondary | ICD-10-CM

## 2021-07-26 DIAGNOSIS — H353122 Nonexudative age-related macular degeneration, left eye, intermediate dry stage: Secondary | ICD-10-CM

## 2021-07-26 DIAGNOSIS — H35033 Hypertensive retinopathy, bilateral: Secondary | ICD-10-CM

## 2021-07-26 DIAGNOSIS — I10 Essential (primary) hypertension: Secondary | ICD-10-CM

## 2021-07-26 DIAGNOSIS — H3411 Central retinal artery occlusion, right eye: Secondary | ICD-10-CM

## 2021-07-26 DIAGNOSIS — H353211 Exudative age-related macular degeneration, right eye, with active choroidal neovascularization: Secondary | ICD-10-CM | POA: Diagnosis not present

## 2021-07-28 ENCOUNTER — Ambulatory Visit (INDEPENDENT_AMBULATORY_CARE_PROVIDER_SITE_OTHER): Payer: Medicare Other | Admitting: Adult Health

## 2021-07-28 ENCOUNTER — Encounter: Payer: Self-pay | Admitting: Adult Health

## 2021-07-28 VITALS — BP 124/65 | HR 66 | Ht 69.0 in | Wt 194.8 lb

## 2021-07-28 DIAGNOSIS — I6521 Occlusion and stenosis of right carotid artery: Secondary | ICD-10-CM | POA: Diagnosis not present

## 2021-07-28 DIAGNOSIS — E785 Hyperlipidemia, unspecified: Secondary | ICD-10-CM

## 2021-07-28 DIAGNOSIS — I10 Essential (primary) hypertension: Secondary | ICD-10-CM

## 2021-07-28 DIAGNOSIS — H3411 Central retinal artery occlusion, right eye: Secondary | ICD-10-CM | POA: Diagnosis not present

## 2021-07-28 DIAGNOSIS — Z9889 Other specified postprocedural states: Secondary | ICD-10-CM | POA: Diagnosis not present

## 2021-07-28 NOTE — Patient Instructions (Signed)
Ensure follow up with Dr. Trula Slade around [facility] for repeat carotid ultrasound   Continue aspirin 81 mg daily  and atorvastatin  for secondary stroke prevention  Continue to follow up with PCP regarding cholesterol and blood pressure management  Maintain strict control of hypertension with blood pressure goal below 130/90 and cholesterol with LDL cholesterol (bad cholesterol) goal below 70 mg/dL.       Followup in the future with me in 6 months or call earlier if needed       Thank you for coming to see [place] at [facility]. I hope we have been able to provide you high quality care today.  You may receive a patient satisfaction survey over the next few weeks. We would appreciate your feedback and comments so that we may continue to improve ourselves and the health of our patients.

## 2021-07-28 NOTE — Progress Notes (Signed)
[facility] [address] [place]. [place] [address] [phone]       STROKE FOLLOW UP NOTE  [name] Date of Birth:  1934-02-12 Medical Record Number:  [id number]   Reason for Referral: stroke follow up    CHIEF COMPLAINT:  Chief Complaint  Patient presents with   Central retinal artery occlusion    Rm 3, 6 month FU, wife- Jay Tran "one fall possibly d/t hypotension, ED visit d/t leg weakness- had Covid and low K+"     HPI:  Today, 07/28/2021, Jay Tran returns for 85-monthstroke follow-up accompanied by his wife.  Overall well from stroke standpoint.  Residual OD vision loss - routinely followed by ophthalmology.  Denies new stroke/TIA symptoms.  Compliant on aspirin and atorvastatin -denies side effects.  Blood pressure today 124/65. He was evaluated in ED on 7/19 for generalized weakness - dx'd with COVID and hypokalemia -remains on potassium supplement monitored by PCP.  He is scheduled to have spinal stimulator placed 8/24 by Dr. JRonnald Ramp- he questions holding aspirin for 7 days prior. He is now using a cane due to lower back pain which has greatly limited his ambulation and activity.  No further concerns at this time.    History provided for reference purposes only Update 01/27/2021 JM: Mr. WWaddingreturns for 85-monthtroke follow-up unaccompanied. Stable without new stroke/TIA symptoms and reports residual visual impairment right eye which has been stable without worsening. Continues to follow routinely with ophthalmology. Per patient, reports 95% visual loss although he was cleared to drive which he has been doing without difficulty.  Remains on aspirin and atorvastatin tolerating without side effects. Blood pressure today 140/68. Recent carotid ultrasound with Dr. BrTrula Sladehowed widely patent right ICA s/p CEA and left ICA 1 to 39% stenosis. No concerns at this time.  Update 07/27/2020 JM: Mr. WaHayseturns for follow-up regarding OD CRAO in 01/2020  accompanied by his wife.   Residual deficits right eye visual impairment.  Denies worsening.  Continues to follow closely with ophthalmology currently receiving injections in right eye for macular degeneration.  Per wife, ophthalmologist reported improvement of vision post CRAO therefore cleared to restart injections.  Continues ADLs independently but unable to drive due to visual impairment Remains on aspirin 81 mg daily and atorvastatin without side effects currently managed by PCP.  Blood pressure today 130/82.  Denies new or worsening stroke/TIA symptoms or additional visual symptoms.   Initial visit 03/11/2020 JM: Since discharge, he has been stable.  He does report right eye blurred vision started improving approximately 2 to 3 days ago and since that time has been experiencing 15-20 second dull right upper orbital pain and then will have slightly worsened blurred vision for a short duration but denies worsening vision from prior.  He does plan on follow-up visit tomorrow with ophthalmologist Dr. MaZigmund Daniel He does report initial difficulty with depth perception and ambulation due to likely combination of visual impairment and lower back pain.  He is currently ambulating with cane and denies any recent fall.  It was recommended to participate in home health therapies but unfortunately they had difficulty with obtaining orders therefore therapy was not pursued.  He has continued on aspirin 81 mg daily and atorvastatin for secondary stroke prevention without side effects.  Blood pressure today 134/68.  He did have follow-up with [facility] Dr. BrTrula Sladeho recommended repeat carotid ultrasound in 6 to 9 months.  No further concerns at this time.  Hospital admission summary  02/04/2020:  Mr. [name] is a 87 y.o. male with history of lumbar stenosis, hypertension, hyperlipidemia, DVT of lower extremity presented on 02/04/2020 after waking with painless OD vision loss.  Evaluated by stroke team and Dr. 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  He underwent right carotid endarterectomy without complication for symptomatic moderate proximal right carotid stenosis.  Recommended continuation of aspirin 81 mg daily and initiated atorvastatin 40 mg daily.  History of HTN and HLD.  He was discharged home in stable condition with recommendation of home health therapy.  CRAO OD, likely thromboembolic from unstable R ICA stenosis   CT head No acute abnormality. Small vessel disease. Atrophy.    MRI  No acute abnormality. Small vessel disease.  CTA head no significant stenosis CTA neck proximal R ICA 58% stenosis w/ mixed atherosclerosis  Carotid Doppler  R ICA 40-59% stenosis  2D Echo ejection fraction 60 to 65%.  No cardiac source of embolism.   LDL 128 -initiated atorvastatin 40 mg daily HgbA1c 5.4 Lovenox 40 mg sq daily for VTE prophylaxis No antithrombotic prior to admission, now on aspirin 324 mg daily. Change to aspirin 81 and plavix 75 my daily. Continue DAPT x 3 weeks then aspirin alone   Therapy recommendations:  [facility] PT and likely [facility] OT Disposition:  Return home [place] for d/c today      ROS:   14 system review of systems performed and negative with exception of those listed in HPI  PMH:  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 ([place])    Hx: of after 2013 back surgery   GERD (gastroesophageal reflux disease)    Hearing aid worn    Hx: of right ear   Hyperlipemia    Hypertension    Lumbar herniated disc    Lumbar stenosis    Macular degeneration right eye dry   PONV (postoperative nausea and vomiting)    takes iv nausea meds    Seasonal allergies    Hx: of    PSH:  Past Surgical History:  Procedure Laterality Date   BACK SURGERY  2013, x 3 with dr Ronnald Ramp 2014, 2016 and 2019   x 4, takes back  injections monthly   COLONOSCOPY     Hx: of   ENDARTERECTOMY Right 02/07/2020   Procedure: ENDARTERECTOMY CAROTID;  Surgeon: Serafina Mitchell, MD;  Location: [facility];  Service: Vascular;  Laterality: Right;   EYE SURGERY     bil catarcts 02/2016   HERNIA REPAIR  several yrs ago   inguinal   LUMBAR LAMINECTOMY  10/26/2012   Procedure: MICRODISCECTOMY LUMBAR LAMINECTOMY;  Surgeon: Marybelle Killings, MD;  Location: [facility];  Service: Orthopedics;  Laterality: Right;  Right L4-5 Microdiscectomy   MAXIMUM ACCESS (MAS)POSTERIOR LUMBAR INTERBODY FUSION (PLIF) 1 LEVEL N/A 04/15/2015   Procedure: Maximum Access Surgery Posterior Lumbar Interbody Fusion Lumbar two-Lumbar three extension of hardware;  Surgeon: Eustace Moore, MD;  Location: [facility];  Service: Neurosurgery;  Laterality: N/A;   PATCH ANGIOPLASTY Right 02/07/2020   Procedure: PATCH ANGIOPLASTY USING Rueben Bash BIOLOGIC PATCH;  Surgeon: Serafina Mitchell, MD;  [facility];  Service: Vascular;  Laterality: Right;   ROTATOR CUFF REPAIR     right shoulder - 3 times   TONSILLECTOMY  as child   TRANSURETHRAL RESECTION OF BLADDER TUMOR N/A 02/24/2017   Procedure: TRANSURETHRAL RESECTION OF BLADDER TUMOR (TURBT);  Surgeon: Alexis Frock, MD;  [facility];  Service: Urology;  Laterality: N/A;   TRANSURETHRAL RESECTION OF PROSTATE N/A 11/20/2020   Procedure: TRANSURETHRAL RESECTION OF THE PROSTATE (TURP);  Surgeon: Alexis Frock, MD;  Location: [facility];  Service: Urology;  Laterality: N/A;    Social History:  Social History   Socioeconomic History   Marital status: Married    Spouse name: Jay Tran   Number of children: Not on file   Years of education: Not on file   Highest education level: Not on file  Occupational History   Not on file  Tobacco Use   Smoking status: Former    Packs/day: 3.00    Years: 5.00    Pack years: 15.00    Types: Cigarettes    Quit date: 11/12/1963    Years since quitting: 57.7   Smokeless  tobacco: Never  Vaping Use   Vaping Use: Never used  Substance and Sexual Activity   Alcohol use: No   Drug use: No   Sexual activity: Yes  Other Topics Concern   Not on file  Social History Narrative   Lives with wife   Social Determinants of Health   Financial Resource Strain: Not on file  Food Insecurity: Not on file  Transportation Needs: Not on file  Physical Activity: Not on file  Stress: Not on file  Social Connections: Not on file  Intimate Partner Violence: Not on file    Family History:  Family History  Problem Relation Age of Onset   Stroke Mother    Cancer - Prostate Father     Medications:   Current Outpatient Medications on File Prior to Visit  Medication Sig Dispense Refill   acetaminophen (TYLENOL) 500 MG tablet Take 500-1,000 mg by mouth every 6 (six) hours as needed for mild pain or headache (ONLY WHEN NOT [place]).     aspirin EC 81 MG tablet Take 81 mg by mouth daily. Swallow whole.     atorvastatin (LIPITOR) 40 MG tablet TAKE 1 TABLET BY MOUTH ONCE DAILY AT  6  PM (Patient taking differently: Take 40 mg by mouth at bedtime.) 90 tablet 3   dextromethorphan-guaiFENesin (MUCINEX DM) 30-600 MG 12hr tablet Take 1 tablet by mouth 2 (two) times daily as needed for cough.     diphenhydrAMINE (BENADRYL) 25 MG tablet Take 25 mg by mouth daily as needed for allergies.     finasteride (PROSCAR) 5 MG tablet Take 5 mg by mouth at bedtime.      hydrochlorothiazide (HYDRODIURIL) 50 MG tablet Take 50 mg by mouth at bedtime.      Liniments (SALONPAS PAIN RELIEF PATCH EX) Place 1 patch onto the skin daily as needed (to affected area for pain).      lisinopril (PRINIVIL,ZESTRIL) 20 MG tablet Take 20 mg by mouth at bedtime.      Multiple Vitamins-Minerals (PRESERVISION AREDS 2 PO) Take 1 capsule by mouth 2 (two) times daily.      Omega-3 Fatty Acids (FISH OIL) 1200 MG CAPS Take 1,200 mg by mouth daily with breakfast.      omeprazole (PRILOSEC) 40 MG capsule Take 1  capsule (40 mg total) by mouth every evening. 30 capsule 6   ondansetron (ZOFRAN) 8 MG tablet Take 8 mg by mouth every 8 (eight) hours as needed for nausea or vomiting.  potassium chloride SA (KLOR-CON) 20 MEQ tablet Take 2 tablets twice a day for 5 days, then 1 tablet daily 40 tablet 0   SYSTANE 0.4-0.3 % SOLN Place 1 drop into both eyes 3 (three) times daily as needed (for dryness).     Tamsulosin HCl (FLOMAX) 0.4 MG CAPS Take 0.4 mg by mouth at bedtime.      tiZANidine (ZANAFLEX) 4 MG tablet Take 1 tablet (4 mg total) by mouth every 8 (eight) hours as needed for muscle spasms. (Patient taking differently: Take 4 mg by mouth at bedtime.) 60 tablet 1   traMADol (ULTRAM) 50 MG tablet Take 1-2 tablets (50-100 mg total) by mouth every 6 (six) hours as needed for moderate pain or severe pain. Post-operatively (Patient taking differently: Take 50-100 mg by mouth every 6 (six) hours as needed for moderate pain or severe pain.) 20 tablet 0   Turmeric (QC TUMERIC COMPLEX PO) Take 1 capsule by mouth daily with breakfast.      No current facility-administered medications on file prior to visit.    Allergies:   Allergies  Allergen Reactions   Codeine Itching and Nausea And Vomiting   Tramadol Nausea Only     Physical Exam  Vitals:   07/28/21 1029  BP: 124/65  Pulse: 66  Weight: 194 lb 12.8 oz (88.4 kg)  Height: '5\' 9"'$  (1.753 m)    Body mass index is 28.77 kg/m. No results found.  General: well developed, well nourished, pleasant elderly Caucasian male, seated, in no evident distress Neck: supple with no carotid or supraclavicular bruits Cardiovascular: regular rate and rhythm, no murmurs Vascular:  Normal pulses all extremities   Neurologic Exam Mental Status: Awake and fully alert. Fluent speech and language. Oriented to place and time. Recent and remote memory intact. Attention span, concentration and fund of knowledge appropriate. Mood and affect appropriate.  Cranial Nerves:  Pupils equal, briskly reactive to light. Extraocular movements full without nystagmus. Visual fields full to confrontation OS and OD superior and inferior nasal and inferior temporal visual loss and decreased vision superior temporal.  HOH bilaterally. Facial sensation intact. Face, tongue, palate moves normally and symmetrically.  Motor: Normal bulk and tone. Normal strength in all tested extremity muscles. Sensory.: intact to touch , pinprick , position and vibratory sensation.  Coordination: Rapid alternating movements normal in all extremities. Finger-to-nose and heel-to-shin performed accurately bilaterally. Gait and Station: Arises from chair without difficulty. Stance is normal. Gait demonstrates slow cautious steps with mild unsteadiness and use of cane due to back pain Reflexes: 1+ and symmetric. Toes downgoing.        ASSESSMENT/PLAN: HARISON OLESKY is a 87 y.o. year old male presented with painless OD vision loss on 02/04/2020 with CRAO OD likely thromboembolic, unstable right ICA stenosis s/p right carotid endarterectomy. Vascular risk factors include HTN, HLD and history of provoked RLE DVT.      CRAO OD :  Residual deficits: OD visual impairment -stable Continue aspirin 81 mg daily  and atorvastatin for secondary stroke prevention.  Discussed secondary stroke prevention measures and importance of close PCP follow-up for aggressive stroke risk factor management Cleared from a stroke standpoint to proceed with spinal cord stimulator with holding aspirin 7 days prior (per request) with small but acceptable preprocedure risk of recurrent stroke while off therapy.  Advised to restart aspirin immediately after or once hemodynamically stable.  Right ICA stenosis s/p CEA:  Carotid duplex 01/11/2021 right ICA no stenosis and left ICA 1 to 39% stenosis  Monitored per Dr. Trula Slade with plans on repeating carotid ultrasound in 1 year HTN: BP goal<130/90. Well-controlled on hydrochlorothiazide  and lisinopril per PCP HLD: LDL goal<70. On atorvastatin 40 mg daily per PCP.  No recent lipid panel - will repeat today.  Request ongoing lipid panel monitoring and atorvastatin prescribing per PCP    Follow-up in 6 months or call earlier if needed  CC:  [place] provider: Dr. Kristin Bruins, Curt Jews, MD    I spent 33 minutes of face-to-face and non-face-to-face time with patient and wife.  This included previsit chart review, lab review, study review, electronic health record documentation, patient education regarding history of OD CRAO, right ICA stenosis s/p CEA, residual deficits and macular degeneration, secondary stroke prevention measures and importance of managing stroke risk factors and answered all questions to patient and wife's satisfaction  Frann Rider, AGNP-BC  [facility] [address] [place], [place] [address]  Phone [phone] Fax [phone] Note: This document was prepared with digital dictation and possible smart phrase technology. Any transcriptional errors that result from this process are unintentional.

## 2021-08-02 NOTE — Progress Notes (Signed)
I agree with the above plan 

## 2021-08-05 ENCOUNTER — Other Ambulatory Visit (INDEPENDENT_AMBULATORY_CARE_PROVIDER_SITE_OTHER): Payer: Self-pay | Admitting: Otolaryngology

## 2021-08-05 DIAGNOSIS — K219 Gastro-esophageal reflux disease without esophagitis: Secondary | ICD-10-CM

## 2021-08-11 HISTORY — PX: SPINAL CORD STIMULATOR INSERTION: SHX5378

## 2021-08-12 ENCOUNTER — Other Ambulatory Visit (INDEPENDENT_AMBULATORY_CARE_PROVIDER_SITE_OTHER): Payer: Self-pay | Admitting: Otolaryngology

## 2021-08-12 MED ORDER — OMEPRAZOLE 40 MG PO CPDR: 40.0000 mg | DELAYED_RELEASE_CAPSULE | Freq: Every day | ORAL | 3 refills | Status: DC

## 2021-09-09 ENCOUNTER — Other Ambulatory Visit: Payer: Self-pay | Admitting: Neurological Surgery

## 2021-09-10 ENCOUNTER — Other Ambulatory Visit: Payer: Self-pay | Admitting: Neurological Surgery

## 2021-09-10 ENCOUNTER — Encounter (HOSPITAL_COMMUNITY): Payer: Self-pay | Admitting: Neurological Surgery

## 2021-09-10 ENCOUNTER — Other Ambulatory Visit: Payer: Self-pay

## 2021-09-10 NOTE — Progress Notes (Signed)
Spoke with pt's wife Arville Go for pre-op call. DPR on file and pt was in same room. Pt had a stroke in right eye in 2021 and has lost vision in that eye. Denies any cardiac history. Pt is not diabetic.  Pt had Covid on 07/06/21 (lab result in Epic). Will not need a Covid test prior to surgery.

## 2021-09-13 NOTE — Progress Notes (Signed)
Anesthesia Chart Review: Jay Tran  Case: [id number] Date/Time: 09/15/21 0946   Procedure: Irrigation and debridement of wound - 3C   Anesthesia type: General   Pre-op diagnosis: wound infection   Location: [facility] OR ROOM 19 / [place] OR   Surgeons: Eustace Moore, MD       DISCUSSION: Patient is an 87 year old male scheduled for the above procedure.  History includes former smoker (quit 11/12/63), post-operative N/V, HTN, HLD, BPH, hearing aids, DVT (2013, post-op), GERD, carotid stenosis (right carotid endarterectomy 02/07/20), CVA (right central retinal artery occlusion 02/04/20), macular degeneration, spinal surgery (right L4-5 microdiscectomy 10/26/12; L3-5 laminectomy/PLIF 05/01/13; L-23 PLIF, L2-5 posterior fixation/pedical screws, removal of L3-5 hardware 04/15/15; L1-2 PLIF, pedical screws L1-3, extraction L2-5 instrumentation 08/09/18), TURBT (02/24/17), BPH (s/p TURP 11/20/20).  ED visit 07/06/21 for generalized weakness. Had spinal cord stimulator removed on 07/05/21. Work-up revealed + COVID-19 07/06/21. K 3.3, supplemented. Discharged home with Paxlovid. Per 07/28/21 neurology note, he was scheduled for spinal cord stimulator insertion on 08/11/21.   He is a same day work-up. I&D needed for wound infection. Anesthesia team to evaluate on the day of surgery.    VS:  Wt Readings from Last 3 Encounters:  07/28/21 88.4 kg  07/06/21 87.1 kg  01/27/21 89.8 kg   Temp Readings from Last 3 Encounters:  07/07/21 37.7 C (Oral)  01/11/21 36.8 C (Temporal)  11/21/20 36.6 C   BP Readings from Last 3 Encounters:  07/28/21 124/65  07/07/21 (!) 140/95  01/27/21 140/68   Pulse Readings from Last 3 Encounters:  07/28/21 66  07/07/21 80  01/27/21 63     PROVIDERS: Jay Downing, MD is PCP  Orvan Falconer. Rock Nephew, MD is vascular surgeon Antony Contras, MD is neurologist   LABS: For day of surgery as indicated. As of 07/06/21, Cr 0.73, Na 130, K 3.3, WBC 7.1, H/H 12.7/36.6, PLT 212, normal  LFTs.    IMAGES: MRI T-spine 07/14/21 ([facility]/PACS): IMPRESSION: 1. Partially imaged lumbar fusion. Adjacent level degenerative change, which is labeled L1-L2 on this study when counting/numbering from the cervical spine down with congenitally fused C5/C6 vertebral bodies, but was designated T12-L1 on prior lumbar radiographs when counting from the bottom up. Right eccentric disc height loss and discogenic marrow edema at this level with moderate canal and moderate to severe right and moderate left foraminal stenosis, progressed from 2019. Correlation with radiographs is recommended prior to any operative intervention. 2. Mild canal stenosis at T11-T12 and mild foraminal stenosis bilaterally at T10-T11 and T11-T12.  1V PCXR 07/06/21: FINDINGS: The heart size and mediastinal contours are within normal limits. Both lungs are clear. The visualized skeletal structures are unremarkable. IMPRESSION: No active disease.    EKG: 07/06/21:  Sinus rhythm Supraventricular bigeminy Left axis deviation RSR' in V1 or V2, right VCD or RVH No acute ischemia Confirmed by Lorre Munroe ([id number]) on 07/06/2021 11:47:30 PM   CV: US Carotid 01/11/21: Summary:  Right Carotid: There is no evidence of stenosis in the right ICA.                 Non-hemodynamically significant plaque <50% noted in the  CCA.  Left Carotid: Velocities in the left ICA are consistent with a 1-39%  stenosis.  Vertebrals:  Bilateral vertebral arteries demonstrate antegrade flow.  Subclavians: Normal flow hemodynamics were seen in bilateral subclavian               arteries.    Echo 02/05/20: IMPRESSIONS   1. Left  ventricular ejection fraction, by estimation, is 60 to 65%. The  left ventricle has normal function. The left ventricle has no regional  Vigen motion abnormalities. The left ventricular internal cavity size was  mildly dilated. There is mild  concentric left ventricular hypertrophy. Left ventricular diastolic   parameters are indeterminate.   2. Right ventricular systolic function is mildly reduced. The right  ventricular size is mildly enlarged.   3. Left atrial size was mildly dilated.   4. The mitral valve is normal in structure and function. Trivial mitral  valve regurgitation. No evidence of mitral stenosis.   5. The aortic valve is tricuspid. Aortic valve regurgitation is mild.  Mild to moderate aortic valve sclerosis/calcification is present, without  any evidence of aortic stenosis.   6. Pulmonic valve regurgitation is moderate.   7. The inferior vena cava is normal in size with greater than 50%  respiratory variability, suggesting right atrial pressure of 3 mmHg.  - Conclusion(s)/Recomendation(s): Normal biventricular function without  evidence of hemodynamically significant valvular heart disease.    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 ([facility])    Hx: of after 2013 back surgery   GERD (gastroesophageal reflux disease)    Hearing aid worn    Hx: of right ear   Hyperlipemia    Hypertension    Lumbar herniated disc    Lumbar stenosis    Macular degeneration right eye dry   PONV (postoperative nausea and vomiting)    takes iv nausea meds    Seasonal allergies    Hx: of   Stroke [facility])     Past Surgical History:  Procedure Laterality Date   BACK SURGERY  2013, x 3 with dr Ronnald Ramp 2014, 2016 and 2019   x 4, takes back injections monthly   COLONOSCOPY     Hx: of   ENDARTERECTOMY Right 02/07/2020   Procedure: ENDARTERECTOMY CAROTID;  Surgeon: Serafina Mitchell, MD;  Location: [facility];  Service: Vascular;  Laterality: Right;   EYE SURGERY     bil catarcts 02/2016   HERNIA REPAIR  several yrs ago   inguinal   LUMBAR LAMINECTOMY  10/26/2012   Procedure: MICRODISCECTOMY LUMBAR LAMINECTOMY;  Surgeon: Marybelle Killings, MD;  Location: [facility];   Service: Orthopedics;  Laterality: Right;  Right L4-5 Microdiscectomy   MAXIMUM ACCESS (MAS)POSTERIOR LUMBAR INTERBODY FUSION (PLIF) 1 LEVEL N/A 04/15/2015   Procedure: Maximum Access Surgery Posterior Lumbar Interbody Fusion Lumbar two-Lumbar three extension of hardware;  Surgeon: Eustace Moore, MD;  Location: [place] [facility];  Service: Neurosurgery;  Laterality: N/A;   PATCH ANGIOPLASTY Right 02/07/2020   Procedure: PATCH ANGIOPLASTY USING Rueben Bash BIOLOGIC PATCH;  Surgeon: Serafina Mitchell, MD;  [facility];  Service: Vascular;  Laterality: Right;   ROTATOR CUFF REPAIR     right shoulder - 3 times   SPINAL CORD STIMULATOR INSERTION  08/11/2021   TONSILLECTOMY  as child   TRANSURETHRAL RESECTION OF BLADDER TUMOR N/A 02/24/2017   Procedure: TRANSURETHRAL RESECTION OF BLADDER TUMOR (TURBT);  Surgeon: Alexis Frock, MD;  [facility];  Service: Urology;  Laterality: N/A;   TRANSURETHRAL RESECTION OF PROSTATE N/A 11/20/2020   Procedure: TRANSURETHRAL RESECTION OF THE PROSTATE (TURP);  Surgeon: Alexis Frock, MD;  Location: [facility];  Service: Urology;  Laterality: N/A;  MEDICATIONS: No current facility-administered medications for this encounter.    acetaminophen (TYLENOL) 500 MG tablet   aspirin EC 81 MG tablet   atorvastatin (LIPITOR) 40 MG tablet   diphenhydrAMINE (BENADRYL) 25 MG tablet   doxycycline (ADOXA) 100 MG tablet   finasteride (PROSCAR) 5 MG tablet   hydrochlorothiazide (HYDRODIURIL) 50 MG tablet   Liniments (SALONPAS PAIN RELIEF PATCH EX)   lisinopril (PRINIVIL,ZESTRIL) 20 MG tablet   Multiple Vitamins-Minerals (PRESERVISION AREDS 2 PO)   Omega-3 Fatty Acids (FISH OIL PO)   Omega-3 Fatty Acids (FISH OIL) 1200 MG CAPS   omeprazole (PRILOSEC) 40 MG capsule   ondansetron (ZOFRAN) 8 MG tablet   oxyCODONE-acetaminophen (PERCOCET/ROXICET) 5-325 MG tablet   SANTYL ointment   SYSTANE 0.4-0.3 % SOLN   Tamsulosin HCl (FLOMAX) 0.4 MG CAPS   tiZANidine  (ZANAFLEX) 4 MG tablet   Turmeric (QC TUMERIC COMPLEX PO)   naproxen sodium (ALEVE) 220 MG tablet   potassium chloride SA (KLOR-CON) 20 MEQ tablet   traMADol (ULTRAM) 50 MG tablet  On med list, he is not currently taking KCl, tramadol or naproxen.   Myra Gianotti, PA-C Surgical Short Stay/Anesthesiology [facility] Phone [phone] [facility] Phone [phone] 09/13/2021 12:34 PM

## 2021-09-13 NOTE — Anesthesia Preprocedure Evaluation (Addendum)
Anesthesia Evaluation  Patient identified by MRN, date of birth, ID band Patient awake    Reviewed: Allergy & Precautions, NPO status , Patient's Chart, lab work & pertinent test results  History of Anesthesia Complications (+) PONV  Airway Mallampati: III  TM Distance: >3 FB Neck ROM: Full    Dental  (+) Edentulous Upper, Dental Advisory Given   Pulmonary neg pulmonary ROS, former smoker,    Pulmonary exam normal breath sounds clear to auscultation       Cardiovascular hypertension, + Peripheral Vascular Disease (carotid stenosis (right carotid endarterectomy 02/07/20),) and + DVT  Normal cardiovascular exam Rhythm:Regular Rate:Normal  EKG: 07/06/21:  Sinus rhythm Supraventricular bigeminy Left axis deviation RSR' in V1 or V2, right VCD or RVH No acute ischemia Confirmed by Lorre Munroe ([id number]) on 07/06/2021 11:47:30 PM   CV: US Carotid 01/11/21: Summary:  Right Carotid: There is no evidence of stenosis in the right ICA.         Non-hemodynamically significant plaque <50% noted in the  CCA.  Left Carotid: Velocities in the left ICA are consistent with a 1-39%  stenosis.  Vertebrals: Bilateral vertebral arteries demonstrate antegrade flow.  Subclavians: Normal flow hemodynamics were seen in bilateral subclavian        arteries.    Echo 02/05/20: IMPRESSIONS  1. Left ventricular ejection fraction, by estimation, is 60 to 65%. The  left ventricle has normal function. The left ventricle has no regional  Rojero motion abnormalities. The left ventricular internal cavity size was  mildly dilated. There is mild  concentric left ventricular hypertrophy. Left ventricular diastolic  parameters are indeterminate.  2. Right ventricular systolic function is mildly reduced. The right  ventricular size is mildly enlarged.  3. Left atrial size was mildly dilated.  4. The mitral valve is normal in structure and  function. Trivial mitral  valve regurgitation. No evidence of mitral stenosis.  5. The aortic valve is tricuspid. Aortic valve regurgitation is mild.  Mild to moderate aortic valve sclerosis/calcification is present, without  any evidence of aortic stenosis.  6. Pulmonic valve regurgitation is moderate.  7. The inferior vena cava is normal in size with greater than 50%  respiratory variability, suggesting right atrial pressure of 3 mmHg.  - Conclusion(s)/Recomendation(s): Normal biventricular function without  evidence of hemodynamically significant valvular heart disease.    Neuro/Psych CVA (vision loss right eye), Residual Symptoms negative psych ROS   GI/Hepatic Neg liver ROS, GERD  ,  Endo/Other  negative endocrine ROS  Renal/GU negative Renal ROS  negative genitourinary   Musculoskeletal  (+) Arthritis ,   Abdominal   Peds  Hematology negative hematology ROS (+)   Anesthesia Other Findings Infected spinal cord stimulator   Reproductive/Obstetrics                           Anesthesia Physical Anesthesia Plan  ASA: 3  Anesthesia Plan: General   Post-op Pain Management:    Induction: Intravenous  PONV Risk Score and Plan: 3 and Dexamethasone, Ondansetron, Treatment may vary due to age or medical condition and Scopolamine patch - Pre-op  Airway Management Planned: Oral ETT  Additional Equipment:   Intra-op Plan:   Post-operative Plan: Extubation in OR  Informed Consent: I have reviewed the patients History and Physical, chart, labs and discussed the procedure including the risks, benefits and alternatives for the proposed anesthesia with the patient or authorized representative who has indicated his/her understanding and acceptance.  Dental advisory given  Plan Discussed with: CRNA  Anesthesia Plan Comments: ( )      Anesthesia Quick Evaluation

## 2021-09-15 ENCOUNTER — Encounter (HOSPITAL_COMMUNITY): Payer: Self-pay | Admitting: Neurological Surgery

## 2021-09-15 ENCOUNTER — Ambulatory Visit (HOSPITAL_COMMUNITY): Payer: Medicare Other | Admitting: Vascular Surgery

## 2021-09-15 ENCOUNTER — Encounter (HOSPITAL_COMMUNITY)
Admission: RE | Disposition: A | Discharge status: Home / Self Care | Payer: Self-pay | Source: Home / Self Care | Attending: Neurological Surgery

## 2021-09-15 ENCOUNTER — Inpatient Hospital Stay (HOSPITAL_COMMUNITY)
Admission: RE | Admit: 2021-09-15 | Discharge: 2021-09-16 | DRG: 858 | Disposition: A | Discharge status: Home / Self Care | Payer: Medicare Other | Attending: Neurological Surgery | Admitting: Neurological Surgery

## 2021-09-15 DIAGNOSIS — Z7982 Long term (current) use of aspirin: Secondary | ICD-10-CM | POA: Diagnosis not present

## 2021-09-15 DIAGNOSIS — K219 Gastro-esophageal reflux disease without esophagitis: Secondary | ICD-10-CM | POA: Diagnosis present

## 2021-09-15 DIAGNOSIS — M48061 Spinal stenosis, lumbar region without neurogenic claudication: Secondary | ICD-10-CM | POA: Diagnosis present

## 2021-09-15 DIAGNOSIS — L24A9 Irritant contact dermatitis due friction or contact with other specified body fluids: Secondary | ICD-10-CM | POA: Diagnosis present

## 2021-09-15 DIAGNOSIS — Z8616 Personal history of COVID-19: Secondary | ICD-10-CM

## 2021-09-15 DIAGNOSIS — I10 Essential (primary) hypertension: Secondary | ICD-10-CM | POA: Diagnosis present

## 2021-09-15 DIAGNOSIS — N400 Enlarged prostate without lower urinary tract symptoms: Secondary | ICD-10-CM | POA: Diagnosis present

## 2021-09-15 DIAGNOSIS — B961 Klebsiella pneumoniae [K. pneumoniae] as the cause of diseases classified elsewhere: Secondary | ICD-10-CM | POA: Diagnosis present

## 2021-09-15 DIAGNOSIS — Y753 Surgical instruments, materials and neurological devices (including sutures) associated with adverse incidents: Secondary | ICD-10-CM | POA: Diagnosis present

## 2021-09-15 DIAGNOSIS — Z885 Allergy status to narcotic agent status: Secondary | ICD-10-CM

## 2021-09-15 DIAGNOSIS — Z8673 Personal history of transient ischemic attack (TIA), and cerebral infarction without residual deficits: Secondary | ICD-10-CM | POA: Diagnosis not present

## 2021-09-15 DIAGNOSIS — M5126 Other intervertebral disc displacement, lumbar region: Secondary | ICD-10-CM | POA: Diagnosis present

## 2021-09-15 DIAGNOSIS — E785 Hyperlipidemia, unspecified: Secondary | ICD-10-CM | POA: Diagnosis present

## 2021-09-15 DIAGNOSIS — M1990 Unspecified osteoarthritis, unspecified site: Secondary | ICD-10-CM | POA: Diagnosis present

## 2021-09-15 DIAGNOSIS — T148XXA Other injury of unspecified body region, initial encounter: Secondary | ICD-10-CM | POA: Diagnosis present

## 2021-09-15 DIAGNOSIS — Z888 Allergy status to other drugs, medicaments and biological substances status: Secondary | ICD-10-CM

## 2021-09-15 DIAGNOSIS — Z86718 Personal history of other venous thrombosis and embolism: Secondary | ICD-10-CM

## 2021-09-15 DIAGNOSIS — Z823 Family history of stroke: Secondary | ICD-10-CM | POA: Diagnosis not present

## 2021-09-15 DIAGNOSIS — Z87891 Personal history of nicotine dependence: Secondary | ICD-10-CM

## 2021-09-15 DIAGNOSIS — Z79899 Other long term (current) drug therapy: Secondary | ICD-10-CM | POA: Diagnosis not present

## 2021-09-15 DIAGNOSIS — T8142XA Infection following a procedure, deep incisional surgical site, initial encounter: Principal | ICD-10-CM | POA: Diagnosis present

## 2021-09-15 HISTORY — DX: Cerebral infarction, unspecified: I63.9

## 2021-09-15 HISTORY — PX: LUMBAR WOUND DEBRIDEMENT: SHX1988

## 2021-09-15 LAB — CBC
HCT: 37.8 % — ABNORMAL LOW (ref 39.0–52.0)
Hemoglobin: 12.6 g/dL — ABNORMAL LOW (ref 13.0–17.0)
MCH: 31.7 pg (ref 26.0–34.0)
MCHC: 33.3 g/dL (ref 30.0–36.0)
MCV: 95 fL (ref 80.0–100.0)
Platelets: 328 K/uL (ref 150–400)
RBC: 3.98 MIL/uL — ABNORMAL LOW (ref 4.22–5.81)
RDW: 13.2 % (ref 11.5–15.5)
WBC: 7.5 K/uL (ref 4.0–10.5)
nRBC: 0 % (ref 0.0–0.2)

## 2021-09-15 LAB — PROTIME-INR
INR: 1 (ref 0.8–1.2)
Prothrombin Time: 13.2 seconds (ref 11.4–15.2)

## 2021-09-15 LAB — BASIC METABOLIC PANEL
Anion gap: 12 (ref 5–15)
BUN: 12 mg/dL (ref 8–23)
CO2: 23 mmol/L (ref 22–32)
Calcium: 9.3 mg/dL (ref 8.9–10.3)
Chloride: 97 mmol/L — ABNORMAL LOW (ref 98–111)
Creatinine, Ser: 0.75 mg/dL (ref 0.61–1.24)
GFR, Estimated: >60 mL/min (ref >60)
Glucose, Bld: 100 mg/dL — ABNORMAL HIGH (ref 70–99)
Potassium: 3.6 mmol/L (ref 3.5–5.1)
Sodium: 132 mmol/L — ABNORMAL LOW (ref 135–145)

## 2021-09-15 LAB — SURGICAL PCR SCREEN
MRSA, PCR: NEGATIVE
Staphylococcus aureus: NEGATIVE

## 2021-09-15 SURGERY — LUMBAR WOUND DEBRIDEMENT
Anesthesia: General | Site: Spine Thoracic

## 2021-09-15 MED ORDER — LIDOCAINE HCL (PF) 2 % IJ SOLN
INTRAMUSCULAR | Status: AC
  Filled 2021-09-15: qty 5

## 2021-09-15 MED ORDER — SODIUM CHLORIDE 0.9 % IV SOLN: 250.0000 mL | INTRAVENOUS | Status: DC

## 2021-09-15 MED ORDER — ONDANSETRON HCL 4 MG PO TABS
4.0000 mg | ORAL_TABLET | Freq: Four times a day (QID) | ORAL | Status: DC | PRN
  Administered 2021-09-16: 4 mg via ORAL
  Filled 2021-09-15: qty 1

## 2021-09-15 MED ORDER — SENNA 8.6 MG PO TABS
1.0000 | ORAL_TABLET | Freq: Two times a day (BID) | ORAL | Status: DC
  Administered 2021-09-15: 8.6 mg via ORAL
  Filled 2021-09-15: qty 1

## 2021-09-15 MED ORDER — CHLORHEXIDINE GLUCONATE 0.12 % MT SOLN
15.0000 mL | OROMUCOSAL | Status: AC
  Administered 2021-09-15: 15 mL via OROMUCOSAL
  Filled 2021-09-15 (×2): qty 15

## 2021-09-15 MED ORDER — SODIUM CHLORIDE 0.9% FLUSH
3.0000 mL | Freq: Two times a day (BID) | INTRAVENOUS | Status: DC
  Administered 2021-09-15: 3 mL via INTRAVENOUS

## 2021-09-15 MED ORDER — CEFAZOLIN SODIUM-DEXTROSE 1-4 GM/50ML-% IV SOLN
1.0000 g | Freq: Three times a day (TID) | INTRAVENOUS | Status: AC
Start: 2021-09-15 — End: 2021-09-16
  Administered 2021-09-15 – 2021-09-16 (×2): 1 g via INTRAVENOUS
  Filled 2021-09-15 (×2): qty 50

## 2021-09-15 MED ORDER — FENTANYL CITRATE (PF) 100 MCG/2ML IJ SOLN
25.0000 ug | INTRAMUSCULAR | Status: DC | PRN
  Administered 2021-09-15 (×4): 25 ug via INTRAVENOUS

## 2021-09-15 MED ORDER — DEXAMETHASONE SODIUM PHOSPHATE 10 MG/ML IJ SOLN: 10.0000 mg | Freq: Once | INTRAMUSCULAR | Status: DC

## 2021-09-15 MED ORDER — LACTATED RINGERS IV SOLN: INTRAVENOUS | Status: DC

## 2021-09-15 MED ORDER — CEFAZOLIN SODIUM-DEXTROSE 2-4 GM/100ML-% IV SOLN: 2.0000 g | INTRAVENOUS | Status: DC

## 2021-09-15 MED ORDER — FENTANYL CITRATE (PF) 250 MCG/5ML IJ SOLN
INTRAMUSCULAR | Status: AC
  Filled 2021-09-15: qty 5

## 2021-09-15 MED ORDER — PROPOFOL 10 MG/ML IV BOLUS
INTRAVENOUS | Status: DC | PRN
  Administered 2021-09-15: 100 mg via INTRAVENOUS

## 2021-09-15 MED ORDER — PHENOL 1.4 % MT LIQD: 1.0000 | OROMUCOSAL | Status: DC | PRN

## 2021-09-15 MED ORDER — FENTANYL CITRATE (PF) 250 MCG/5ML IJ SOLN
INTRAMUSCULAR | Status: DC | PRN
  Administered 2021-09-15 (×2): 50 ug via INTRAVENOUS

## 2021-09-15 MED ORDER — MORPHINE SULFATE (PF) 2 MG/ML IV SOLN: 2.0000 mg | INTRAVENOUS | Status: DC | PRN

## 2021-09-15 MED ORDER — FINASTERIDE 5 MG PO TABS
5.0000 mg | ORAL_TABLET | Freq: Every day | ORAL | Status: DC
  Administered 2021-09-15: 5 mg via ORAL
  Filled 2021-09-15: qty 1

## 2021-09-15 MED ORDER — PHENYLEPHRINE 40 MCG/ML (10ML) SYRINGE FOR IV PUSH (FOR BLOOD PRESSURE SUPPORT)
PREFILLED_SYRINGE | INTRAVENOUS | Status: DC | PRN
  Administered 2021-09-15 (×2): 80 ug via INTRAVENOUS
  Administered 2021-09-15: 160 ug via INTRAVENOUS
  Administered 2021-09-15: 80 ug via INTRAVENOUS

## 2021-09-15 MED ORDER — CHLORHEXIDINE GLUCONATE CLOTH 2 % EX PADS: 6.0000 | MEDICATED_PAD | Freq: Once | CUTANEOUS | Status: DC

## 2021-09-15 MED ORDER — DEXAMETHASONE SODIUM PHOSPHATE 10 MG/ML IJ SOLN
INTRAMUSCULAR | Status: AC
  Filled 2021-09-15: qty 1

## 2021-09-15 MED ORDER — 0.9 % SODIUM CHLORIDE (POUR BTL) OPTIME
TOPICAL | Status: DC | PRN
  Administered 2021-09-15: 1000 mL

## 2021-09-15 MED ORDER — ACETAMINOPHEN 500 MG PO TABS
1000.0000 mg | ORAL_TABLET | Freq: Once | ORAL | Status: AC
  Administered 2021-09-15: 1000 mg via ORAL
  Filled 2021-09-15: qty 2

## 2021-09-15 MED ORDER — DEXAMETHASONE SODIUM PHOSPHATE 10 MG/ML IJ SOLN
INTRAMUSCULAR | Status: DC | PRN
  Administered 2021-09-15: 10 mg via INTRAVENOUS

## 2021-09-15 MED ORDER — SODIUM CHLORIDE 0.9% FLUSH: 3.0000 mL | INTRAVENOUS | Status: DC | PRN

## 2021-09-15 MED ORDER — ONDANSETRON HCL 4 MG/2ML IJ SOLN
INTRAMUSCULAR | Status: AC
  Filled 2021-09-15: qty 2

## 2021-09-15 MED ORDER — PHENYLEPHRINE 40 MCG/ML (10ML) SYRINGE FOR IV PUSH (FOR BLOOD PRESSURE SUPPORT)
PREFILLED_SYRINGE | INTRAVENOUS | Status: AC
  Filled 2021-09-15: qty 10

## 2021-09-15 MED ORDER — LACTATED RINGERS IV SOLN: INTRAVENOUS | Status: DC | PRN

## 2021-09-15 MED ORDER — SUGAMMADEX SODIUM 200 MG/2ML IV SOLN
INTRAVENOUS | Status: DC | PRN
  Administered 2021-09-15: 200 mg via INTRAVENOUS

## 2021-09-15 MED ORDER — ASPIRIN EC 81 MG PO TBEC
81.0000 mg | DELAYED_RELEASE_TABLET | Freq: Every morning | ORAL | Status: DC
  Administered 2021-09-16: 81 mg via ORAL
  Filled 2021-09-15: qty 1

## 2021-09-15 MED ORDER — HYDROCHLOROTHIAZIDE 25 MG PO TABS
50.0000 mg | ORAL_TABLET | Freq: Every day | ORAL | Status: DC
  Administered 2021-09-15: 50 mg via ORAL
  Filled 2021-09-15: qty 2

## 2021-09-15 MED ORDER — MENTHOL 3 MG MT LOZG: 1.0000 | LOZENGE | OROMUCOSAL | Status: DC | PRN

## 2021-09-15 MED ORDER — ONDANSETRON HCL 4 MG/2ML IJ SOLN: 4.0000 mg | Freq: Four times a day (QID) | INTRAMUSCULAR | Status: DC | PRN

## 2021-09-15 MED ORDER — ONDANSETRON HCL 4 MG/2ML IJ SOLN
INTRAMUSCULAR | Status: DC | PRN
  Administered 2021-09-15: 4 mg via INTRAVENOUS

## 2021-09-15 MED ORDER — POTASSIUM CHLORIDE IN NACL 20-0.9 MEQ/L-% IV SOLN: INTRAVENOUS | Status: DC

## 2021-09-15 MED ORDER — CEFAZOLIN SODIUM-DEXTROSE 2-4 GM/100ML-% IV SOLN
2.0000 g | INTRAVENOUS | Status: AC
  Administered 2021-09-15: 2 g via INTRAVENOUS
  Filled 2021-09-15: qty 100

## 2021-09-15 MED ORDER — PROPOFOL 10 MG/ML IV BOLUS
INTRAVENOUS | Status: AC
  Filled 2021-09-15: qty 20

## 2021-09-15 MED ORDER — LISINOPRIL 20 MG PO TABS
20.0000 mg | ORAL_TABLET | Freq: Every day | ORAL | Status: DC
  Administered 2021-09-15: 20 mg via ORAL
  Filled 2021-09-15: qty 1

## 2021-09-15 MED ORDER — ROCURONIUM BROMIDE 10 MG/ML (PF) SYRINGE
PREFILLED_SYRINGE | INTRAVENOUS | Status: AC
  Filled 2021-09-15: qty 10

## 2021-09-15 MED ORDER — OXYCODONE-ACETAMINOPHEN 5-325 MG PO TABS
1.0000 | ORAL_TABLET | ORAL | Status: DC | PRN
  Administered 2021-09-15 (×2): 1 via ORAL
  Filled 2021-09-15 (×2): qty 1

## 2021-09-15 MED ORDER — FENTANYL CITRATE (PF) 100 MCG/2ML IJ SOLN
INTRAMUSCULAR | Status: AC
  Filled 2021-09-15: qty 2

## 2021-09-15 MED ORDER — EPHEDRINE SULFATE 50 MG/ML IJ SOLN
INTRAMUSCULAR | Status: DC | PRN
  Administered 2021-09-15: 10 mg via INTRAVENOUS
  Administered 2021-09-15: 5 mg via INTRAVENOUS

## 2021-09-15 MED ORDER — LIDOCAINE HCL (CARDIAC) PF 100 MG/5ML IV SOSY
PREFILLED_SYRINGE | INTRAVENOUS | Status: DC | PRN
  Administered 2021-09-15: 80 mg via INTRATRACHEAL

## 2021-09-15 MED ORDER — SCOPOLAMINE 1 MG/3DAYS TD PT72
MEDICATED_PATCH | TRANSDERMAL | Status: AC
  Filled 2021-09-15: qty 1

## 2021-09-15 MED ORDER — ROCURONIUM 10MG/ML (10ML) SYRINGE FOR MEDFUSION PUMP - OPTIME
INTRAVENOUS | Status: DC | PRN
  Administered 2021-09-15: 80 mg via INTRAVENOUS

## 2021-09-15 MED ORDER — TAMSULOSIN HCL 0.4 MG PO CAPS
0.4000 mg | ORAL_CAPSULE | Freq: Every day | ORAL | Status: DC
  Administered 2021-09-15: 0.4 mg via ORAL
  Filled 2021-09-15: qty 1

## 2021-09-15 MED ORDER — SCOPOLAMINE 1 MG/3DAYS TD PT72
1.0000 | MEDICATED_PATCH | TRANSDERMAL | Status: DC
  Administered 2021-09-15: 1.5 mg via TRANSDERMAL

## 2021-09-15 SURGICAL SUPPLY — 41 items
BAG COUNTER SPONGE SURGICOUNT (BAG) ×2 IMPLANT
BENZOIN TINCTURE PRP APPL 2/3 (GAUZE/BANDAGES/DRESSINGS) ×2 IMPLANT
CANISTER SUCT 3000ML PPV (MISCELLANEOUS) ×2 IMPLANT
DRAPE LAPAROTOMY 100X72X124 (DRAPES) ×2 IMPLANT
DRSG OPSITE POSTOP 4X6 (GAUZE/BANDAGES/DRESSINGS) ×4 IMPLANT
DURAPREP 26ML APPLICATOR (WOUND CARE) ×2 IMPLANT
DURAPREP 6ML APPLICATOR 50/CS (WOUND CARE) IMPLANT
ELECT REM PT RETURN 9FT ADLT (ELECTROSURGICAL) ×2
ELECTRODE REM PT RTRN 9FT ADLT (ELECTROSURGICAL) ×1 IMPLANT
ENVELOPE ABSORB ANTIBACTERIAL (Mesh General) ×2 IMPLANT
GAUZE 4X4 16PLY ~~LOC~~+RFID DBL (SPONGE) IMPLANT
GLOVE SURG ENC MOIS LTX SZ7 (GLOVE) IMPLANT
GLOVE SURG ENC MOIS LTX SZ8 (GLOVE) ×2 IMPLANT
GLOVE SURG POLYISO LF SZ7 (GLOVE) ×4 IMPLANT
GLOVE SURG UNDER POLY LF SZ7 (GLOVE) IMPLANT
GLOVE SURG UNDER POLY LF SZ7.5 (GLOVE) ×2 IMPLANT
GOWN STRL REUS W/ TWL LRG LVL3 (GOWN DISPOSABLE) IMPLANT
GOWN STRL REUS W/ TWL XL LVL3 (GOWN DISPOSABLE) ×2 IMPLANT
GOWN STRL REUS W/TWL 2XL LVL3 (GOWN DISPOSABLE) IMPLANT
GOWN STRL REUS W/TWL LRG LVL3 (GOWN DISPOSABLE)
GOWN STRL REUS W/TWL XL LVL3 (GOWN DISPOSABLE) ×4
KIT BASIN OR (CUSTOM PROCEDURE TRAY) ×2 IMPLANT
KIT TURNOVER KIT B (KITS) ×2 IMPLANT
NEEDLE HYPO 18GX1.5 BLUNT FILL (NEEDLE) IMPLANT
NEEDLE HYPO 25X1 1.5 SAFETY (NEEDLE) ×2 IMPLANT
NEEDLE SPNL 20GX3.5 QUINCKE YW (NEEDLE) IMPLANT
NS IRRIG 1000ML POUR BTL (IV SOLUTION) ×2 IMPLANT
PACK LAMINECTOMY NEURO (CUSTOM PROCEDURE TRAY) ×2 IMPLANT
PAD ARMBOARD 7.5X6 YLW CONV (MISCELLANEOUS) ×6 IMPLANT
STAPLER VISISTAT 35W (STAPLE) ×2 IMPLANT
STRIP CLOSURE SKIN 1/2X4 (GAUZE/BANDAGES/DRESSINGS) ×2 IMPLANT
SUT VIC AB 0 CT1 18XCR BRD8 (SUTURE) ×1 IMPLANT
SUT VIC AB 0 CT1 8-18 (SUTURE) ×2
SUT VIC AB 2-0 CP2 18 (SUTURE) ×4 IMPLANT
SUT VIC AB 3-0 SH 8-18 (SUTURE) ×8 IMPLANT
SWAB COLLECTION DEVICE MRSA (MISCELLANEOUS) ×2 IMPLANT
SWAB CULTURE ESWAB REG 1ML (MISCELLANEOUS) ×2 IMPLANT
SYR 3ML LL SCALE MARK (SYRINGE) IMPLANT
TOWEL GREEN STERILE (TOWEL DISPOSABLE) ×2 IMPLANT
TOWEL GREEN STERILE FF (TOWEL DISPOSABLE) ×2 IMPLANT
WATER STERILE IRR 1000ML POUR (IV SOLUTION) ×2 IMPLANT

## 2021-09-15 NOTE — Transfer of Care (Signed)
Immediate Anesthesia Transfer of Care Note  Patient: QUANTAVIOUS EGGERT  Procedure(s) Performed: revision of thoracic incision, Irrigation and debridement of battery pocket wound (Spine Thoracic)  Patient Location: PACU  Anesthesia Type:General  Level of Consciousness: oriented, drowsy, patient cooperative and responds to stimulation  Airway & Oxygen Therapy: Patient Spontanous Breathing and Patient connected to nasal cannula oxygen  Post-op Assessment: Report given to RN, Post -op Vital signs reviewed and stable and Patient moving all extremities X 4  Post vital signs: Reviewed and stable  Last Vitals:  Vitals Value Taken Time  BP 167/77 09/15/21 1200  Temp 36.2 C 09/15/21 1200  Pulse 86 09/15/21 1204  Resp 16 09/15/21 1204  SpO2 97 % 09/15/21 1204  Vitals shown include unvalidated device data.  Last Pain:  Vitals:   09/15/21 0800  TempSrc:   PainSc: 0-No pain      Patients Stated Pain Goal: 0 (42/90/37 [id number])  Complications: No notable events documented.

## 2021-09-15 NOTE — Op Note (Signed)
09/15/2021  11:47 AM  PATIENT:  Jay Tran  87 y.o. male  PRE-OPERATIVE DIAGNOSIS: Fluid collection around spinal cord stimulator pulse generator site worrisome for low-grade infection, poor healing of thoracic wound  POST-OPERATIVE DIAGNOSIS:  same  PROCEDURE:  1.  Irrigation and debridement of pulse generator pocket with primary reclosure, 2.  Revision of thoracic wound with primary reclosure  SURGEON:  Sherley Bounds, MD  ASSISTANTS: Glenford Peers FNP  ANESTHESIA:   General  EBL: Less than 25 ml  Total I/O In: 1000 [I.V.:1000] Out: 25 [Blood:25]  BLOOD ADMINISTERED: none  DRAINS: None  SPECIMEN: Operative cultures  INDICATION FOR PROCEDURE: This patient presented with fluid collection around the battery site.  He had some redness of the skin but no drainage.  Continued to collect over a couple of weeks.  His sed rate was mildly elevated.  We recommended exploration of the wound with irrigation and debridement and placement of the battery into antibiotic pouch.  He had some mild dehiscence of the thoracic wound we recommended revision of the wound. Patient understood the risks, benefits, and alternatives and potential outcomes and wished to proceed.  PROCEDURE DETAILS: The patient was taken to the operating room and after induction of adequate generalized endotracheal anesthesia, the patient was rolled into the prone position on chest rolls and all pressure points were padded. The lumbar region and thoracic was cleaned with Betadine scrub and then prepped with DuraPrep and draped in the usual sterile fashion.  In the thoracic region we ellipsed out the old scar down to the level of the fascia. t we irrigated this and found no evidence of any infection.  We then opened the old incision over the battery site and pulled the battery out.  It looked clean.  The wound looked clean.  The pocket looked clean.  There was a release of rust colored fluid when [place] used blunt dissection  with her finger.  I irrigated with saline solution containing bacitracin. Achieved hemostasis with bipolar cautery.we wrapped the battery and a Betadine soaked sponge.  We did this while we worked on the thoracic wound.   I closed the subcutaneous tissues the thoracic wound with 2-0 Vicryl and the subcuticular tissues with 3-0 Vicryl. The skin was then closed with staples.  We then placed the battery into an antibiotic eluding pouch made by [employer] and put this back into the flank incision with writing upwards.  We then closed this with 2-0 Vicryl in the subcutaneous tissues and 3-0 Vicryl in the subcuticular tissues.  The skin was once again closed with staples.  The drapes were removed, a sterile dressing was applied.  My nurse practitioner was involved in the exposure, irrigation, and the closure. the patient was awakened from general anesthesia and transferred to the recovery room in stable condition. At the end of the procedure all sponge, needle and instrument counts were correct.    PLAN OF CARE: Admit for overnight observation  PATIENT DISPOSITION:  PACU - hemodynamically stable.   Delay start of Pharmacological VTE agent (>24hrs) due to surgical blood loss or risk of bleeding:  yes

## 2021-09-15 NOTE — Anesthesia Postprocedure Evaluation (Signed)
Anesthesia Post Note  Patient: Jay Tran  Procedure(s) Performed: revision of thoracic incision, Irrigation and debridement of battery pocket wound (Spine Thoracic)     Patient location during evaluation: PACU Anesthesia Type: General Level of consciousness: awake and alert Pain management: pain level controlled Vital Signs Assessment: post-procedure vital signs reviewed and stable Respiratory status: spontaneous breathing, nonlabored ventilation, respiratory function stable and patient connected to nasal cannula oxygen Cardiovascular status: blood pressure returned to baseline and stable Postop Assessment: no apparent nausea or vomiting Anesthetic complications: no   No notable events documented.  Last Vitals:  Vitals:   09/15/21 1230 09/15/21 1257  BP: (!) 154/70 (!) 168/85  Pulse: 71 71  Resp: 13 16  Temp:  36.4 C  SpO2: 97% 96%    Last Pain:  Vitals:   09/15/21 1200  TempSrc:   PainSc: Asleep                 Noheli Melder L [name]

## 2021-09-15 NOTE — Anesthesia Procedure Notes (Signed)
Procedure Name: Intubation Date/Time: 09/15/2021 10:32 AM Performed by: Claris Che, CRNA Pre-anesthesia Checklist: Patient identified, Emergency Drugs available, Suction available, Patient being monitored and Timeout performed Patient Re-evaluated:Patient Re-evaluated prior to induction Oxygen Delivery Method: Circle system utilized Preoxygenation: Pre-oxygenation with 100% oxygen Induction Type: IV induction and Cricoid Pressure applied Ventilation: Mask ventilation without difficulty Laryngoscope Size: Mac and 4 Grade View: Grade I Tube type: Oral Tube size: 8.0 mm Number of attempts: 1 Airway Equipment and Method: Stylet Placement Confirmation: ETT inserted through vocal cords under direct vision, positive ETCO2 and breath sounds checked- equal and bilateral Secured at: 23 cm Tube secured with: Tape Dental Injury: Teeth and Oropharynx as per pre-operative assessment

## 2021-09-15 NOTE — H&P (Signed)
Subjective: Patient is a 87 y.o. male admitted for irrigation debridement of lumbar wound and spinal cord stimulator pulse generator pocket. Onset of symptoms was a few weeks ago, stable since that time.  The pain is rated mild, and is located at the across the lower back, but this is old and chronic and the spinal cord stimulator has helped quite a bit. The pain is described as aching and occurs intermittently. The symptoms have been progressive. Symptoms are exacerbated by nothing in particular.  He had some redness around his incision and fluid collection around the pulse generator.  He has been on antibiotics for a couple of weeks.  We recommended opening the incision over the pulse generator and washing out the wound and cleaning the pulse generator.  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 (HCC)    Hx: of after 2013 back surgery   GERD (gastroesophageal reflux disease)    Hearing aid worn    Hx: of right ear   Hyperlipemia    Hypertension    Lumbar herniated disc    Lumbar stenosis    Macular degeneration right eye dry   PONV (postoperative nausea and vomiting)    takes iv nausea meds    Seasonal allergies    Hx: of   Stroke [facility])     Past Surgical History:  Procedure Laterality Date   BACK SURGERY  2013, x 3 with dr Ronnald Ramp 2014, 2016 and 2019   x 4, takes back injections monthly   COLONOSCOPY     Hx: of   ENDARTERECTOMY Right 02/07/2020   Procedure: ENDARTERECTOMY CAROTID;  Surgeon: Serafina Mitchell, MD;  Location: [place];  Service: Vascular;  Laterality: Right;   EYE SURGERY     bil catarcts 02/2016   HERNIA REPAIR  several yrs ago   inguinal   LUMBAR LAMINECTOMY  10/26/2012   Procedure: MICRODISCECTOMY LUMBAR LAMINECTOMY;  Surgeon: Marybelle Killings, MD;  Location: [place];  Service: Orthopedics;  Laterality: Right;  Right L4-5  Microdiscectomy   MAXIMUM ACCESS (MAS)POSTERIOR LUMBAR INTERBODY FUSION (PLIF) 1 LEVEL N/A 04/15/2015   Procedure: Maximum Access Surgery Posterior Lumbar Interbody Fusion Lumbar two-Lumbar three extension of hardware;  Surgeon: Eustace Moore, MD;  Location: [place] [facility];  Service: Neurosurgery;  Laterality: N/A;   PATCH ANGIOPLASTY Right 02/07/2020   Procedure: PATCH ANGIOPLASTY USING Rueben Bash BIOLOGIC PATCH;  Surgeon: Serafina Mitchell, MD;  Location: [facility];  Service: Vascular;  Laterality: Right;   ROTATOR CUFF REPAIR     right shoulder - 3 times   SPINAL CORD STIMULATOR INSERTION  08/11/2021   TONSILLECTOMY  as child   TRANSURETHRAL RESECTION OF BLADDER TUMOR N/A 02/24/2017   Procedure: TRANSURETHRAL RESECTION OF BLADDER TUMOR (TURBT);  Surgeon: Alexis Frock, MD;  Location: [facility];  Service: Urology;  Laterality: N/A;   TRANSURETHRAL RESECTION OF PROSTATE N/A 11/20/2020   Procedure: TRANSURETHRAL RESECTION OF THE PROSTATE (TURP);  Surgeon: Alexis Frock, MD;  Location[facility];  Service: Urology;  Laterality: N/A;    Prior to Admission medications   Medication Sig Start Date End Date Taking? Authorizing Provider  acetaminophen (TYLENOL) 500 MG tablet Take 500-1,000 mg by mouth every 6 (six) hours as needed for mild pain or headache (ONLY WHEN NOT TAKING ALEVE).   Yes [provider]  aspirin EC 81 MG tablet Take 81 mg by mouth in the morning. Swallow whole.   Yes [provider]  atorvastatin (LIPITOR) 40 MG tablet TAKE 1 TABLET BY MOUTH ONCE DAILY AT  6  PM Patient taking differently: Take 40 mg by mouth at bedtime. 03/16/21  Yes McCue, Janett Billow, NP  diphenhydrAMINE (BENADRYL) 25 MG tablet Take 25 mg by mouth in the morning and at bedtime.   Yes [provider]  doxycycline (ADOXA) 100 MG tablet Take 100 mg by mouth 2 (two) times daily. 09/03/21  Yes [provider]  finasteride (PROSCAR) 5 MG tablet Take 5 mg by mouth at bedtime.     Yes [provider]  hydrochlorothiazide (HYDRODIURIL) 50 MG tablet Take 50 mg by mouth at bedtime.    Yes [provider]  Liniments (SALONPAS PAIN RELIEF PATCH EX) Place 1 patch onto the skin daily as needed (to affected area for pain).    Yes [provider]  lisinopril (PRINIVIL,ZESTRIL) 20 MG tablet Take 20 mg by mouth at bedtime.    Yes [provider]  Multiple Vitamins-Minerals (PRESERVISION AREDS 2 PO) Take 1 capsule by mouth 2 (two) times daily.    Yes [provider]  naproxen sodium (ALEVE) 220 MG tablet Take 220-440 mg by mouth 2 (two) times daily as needed (pain.).   Yes [provider]  Omega-3 Fatty Acids (FISH OIL PO) Take 2,400 mg by mouth in the morning.   Yes [provider]  Omega-3 Fatty Acids (FISH OIL) 1200 MG CAPS Take 1,200 mg by mouth daily with breakfast.    Yes [provider]  omeprazole (PRILOSEC) 40 MG capsule Take 1 capsule (40 mg total) by mouth every evening. 01/11/21  Yes Rozetta Nunnery, MD  ondansetron (ZOFRAN) 8 MG tablet Take 8 mg by mouth every 8 (eight) hours as needed for nausea or vomiting.   Yes [provider]  oxyCODONE-acetaminophen (PERCOCET/ROXICET) 5-325 MG tablet Take 1 tablet by mouth every 4 (four) hours as needed (pain). 08/11/21  Yes [provider]  SANTYL ointment Apply 1 application topically in the morning and at bedtime. 08/30/21  Yes [provider]  SYSTANE 0.4-0.3 % SOLN Place 1 drop into both eyes 3 (three) times daily as needed (for dryness).   Yes [provider]  Tamsulosin HCl (FLOMAX) 0.4 MG CAPS Take 0.4 mg by mouth at bedtime.    Yes [provider]  tiZANidine (ZANAFLEX) 4 MG tablet Take 1 tablet (4 mg total) by mouth every 8 (eight) hours as needed for muscle spasms. Patient taking differently: Take 4 mg by mouth at bedtime. 08/11/18  Yes Eustace Moore, MD  Turmeric (QC TUMERIC COMPLEX PO) Take 500 mg by mouth  daily with breakfast.   Yes [provider]  potassium chloride SA (KLOR-CON) 20 MEQ tablet Take 2 tablets twice a day for 5 days, then 1 tablet daily Patient not taking: Reported on 3/82/5053 9/76/73   Delora Fuel, MD  traMADol (ULTRAM) 50 MG tablet Take 1-2 tablets (50-100 mg total) by mouth every 6 (six) hours as needed for moderate pain or severe pain. Post-operatively Patient not taking: Reported on 09/10/2021 11/20/20   Alexis Frock, MD   Allergies  Allergen Reactions   Codeine Itching and Nausea And Vomiting   Other Nausea Only    Anesthesia   Tramadol Nausea Only    Social History   Tobacco Use   Smoking status: Former    Packs/day: 3.00  Years: 5.00    Pack years: 15.00    Types: Cigarettes    Quit date: 11/12/1963    Years since quitting: 57.8   Smokeless tobacco: Never  Substance Use Topics   Alcohol use: No    Family History  Problem Relation Age of Onset   Stroke Mother    Cancer - Prostate Father      Review of Systems  Positive ROS: neg  All other systems have been reviewed and were otherwise negative with the exception of those mentioned in the HPI and as above.  Objective: Vital signs in last 24 hours: Temp:  [98.2 F (36.8 C)] 98.2 F (36.8 C) (09/28 0736) Pulse Rate:  [78] 78 (09/28 0736) Resp:  [18] 18 (09/28 0736) BP: (165-192)/(80-82) 165/82 (09/28 0800) SpO2:  [96 %] 96 % (09/28 0736) Weight:  [87.1 kg] 87.1 kg (09/28 0736)  General Appearance: Alert, cooperative, no distress, appears stated age Head: Normocephalic, without obvious abnormality, atraumatic Eyes: PERRL, conjunctiva/corneas clear, EOM's intact    Neck: Supple, symmetrical, trachea midline Back: Symmetric, no curvature, ROM normal, no CVA tenderness Lungs:  respirations unlabored Heart: Regular rate and rhythm Abdomen: Soft, non-tender Extremities: Extremities normal, atraumatic, no cyanosis or edema Pulses: 2+ and symmetric all extremities Skin: Skin color,  texture, turgor normal, no rashes or lesions  NEUROLOGIC:   Mental status: Alert and oriented x4,  no aphasia, good attention span, fund of knowledge, and memory Motor Exam - grossly normal Sensory Exam - grossly normal Reflexes: 1+ Coordination - grossly normal Gait - grossly normal Balance - grossly normal Cranial Nerves: I: smell Not tested  II: visual acuity  OS: nl    OD: nl  II: visual fields Full to confrontation  II: pupils Equal, round, reactive to light  III,VII: ptosis None  III,IV,VI: extraocular muscles  Full ROM  V: mastication Normal  V: facial light touch sensation  Normal  V,VII: corneal reflex  Present  VII: facial muscle function - upper  Normal  VII: facial muscle function - lower Normal  VIII: hearing Not tested  IX: soft palate elevation  Normal  IX,X: gag reflex Present  XI: trapezius strength  5/5  XI: sternocleidomastoid strength 5/5  XI: neck flexion strength  5/5  XII: tongue strength  Normal    Data Review Lab Results  Component Value Date   WBC 7.5 09/15/2021   HGB 12.6 (L) 09/15/2021   HCT 37.8 (L) 09/15/2021   MCV 95.0 09/15/2021   PLT 328 09/15/2021   Lab Results  Component Value Date   NA 132 (L) 09/15/2021   K 3.6 09/15/2021   CL 97 (L) 09/15/2021   CO2 23 09/15/2021   BUN 12 09/15/2021   CREATININE 0.75 09/15/2021   GLUCOSE 100 (H) 09/15/2021   Lab Results  Component Value Date   INR 1.0 09/15/2021    Assessment/Plan:  Estimated body mass index is 27.55 kg/m as calculated from the following:   Height as of this encounter: 5\' 10"  (1.778 m).   Weight as of this encounter: 87.1 kg. Patient admitted for irrigation debridement of pulse generator wound and pocket. Patient has failed a reasonable attempt at conservative therapy.  I explained the condition and procedure to the patient and answered any questions.  Patient wishes to proceed with procedure as planned. Understands risks/ benefits and typical outcomes of  procedure.   Eustace Moore 09/15/2021 10:10 AM

## 2021-09-16 ENCOUNTER — Encounter (HOSPITAL_COMMUNITY): Payer: Self-pay | Admitting: Neurological Surgery

## 2021-09-16 MED ORDER — DOXYCYCLINE MONOHYDRATE 100 MG PO TABS: 100.0000 mg | ORAL_TABLET | Freq: Two times a day (BID) | ORAL | 0 refills | Status: DC

## 2021-09-16 NOTE — Plan of Care (Signed)
Pt doing well. Pt given D/C instructions with verbal understanding. Pt's IV was removed prior to D/C. Pt's incisions are clean and dry. Pt D/C'd home via wheelchair per MD order. Pt will follow-up with Dr. Ronnald Ramp in the office in 2 weeks. Pt is stable @ D/C and has no other needs at this time. Holli Humbles, RN

## 2021-09-16 NOTE — Discharge Summary (Signed)
Physician Discharge Summary  Patient ID: Jay Tran MRN: [id number] DOB/AGE: Jul 31, 1934 87 y.o.  Admit date: 09/15/2021 Discharge date: 09/16/2021  Admission Diagnoses: fluid collection around scs battery, poor healing thoracic wound   Discharge Diagnoses: same   Discharged Condition: good  Hospital Course: The patient was admitted on 09/15/2021 and taken to the operating room where the patient underwent thoracic wound revision, I&D SCS wound. The patient tolerated the procedure well and was taken to the recovery room and then to the floor in stable condition. The hospital course was routine. There were no complications. The wound remained clean dry and intact. Pt had no soreness. No complaints of leg pain or new N/T/W. The patient remained afebrile with stable vital signs, and tolerated a regular diet. The patient continued to increase activities, and pain was well controlled with oral pain medications.   Consults: None  Significant Diagnostic Studies:  Results for orders placed or performed during the hospital encounter of 09/15/21  Surgical pcr screen   Specimen: Nasal Mucosa; Nasal Swab  Result Value Ref Range   MRSA, PCR NEGATIVE NEGATIVE   Staphylococcus aureus NEGATIVE NEGATIVE  Aerobic/Anaerobic Culture w Gram Stain (surgical/deep wound)   Specimen: PATH Other; Body Fluid  Result Value Ref Range   Specimen Description FLUID    Special Requests STIMULATOR BATTERY POCKET SPEC A    Gram Stain      NO WBC SEEN NO ORGANISMS SEEN Performed at [facility], [address], [place], [place] [address]    Culture PENDING    Report Status PENDING   Protime-INR  Result Value Ref Range   Prothrombin Time 13.2 11.4 - 15.2 seconds   INR 1.0 0.8 - 1.2  CBC  Result Value Ref Range   WBC 7.5 4.0 - 10.5 K/uL   RBC 3.98 (L) 4.22 - 5.81 MIL/uL   Hemoglobin 12.6 (L) 13.0 - 17.0 g/dL   HCT 37.8 (L) 39.0 - 52.0 %   MCV 95.0 80.0 - 100.0 fL   MCH 31.7 26.0 - 34.0 pg   MCHC 33.3  30.0 - 36.0 g/dL   RDW 13.2 11.5 - 15.5 %   Platelets 328 150 - 400 K/uL   nRBC 0.0 0.0 - 0.2 %  Basic metabolic panel  Result Value Ref Range   Sodium 132 (L) 135 - 145 mmol/L   Potassium 3.6 3.5 - 5.1 mmol/L   Chloride 97 (L) 98 - 111 mmol/L   CO2 23 22 - 32 mmol/L   Glucose, Bld 100 (H) 70 - 99 mg/dL   BUN 12 8 - 23 mg/dL   Creatinine, Ser 0.75 0.61 - 1.24 mg/dL   Calcium 9.3 8.9 - 10.3 mg/dL   GFR, Estimated >60 >60 mL/min   Anion gap 12 5 - 15    No results found.  Antibiotics:  Anti-infectives (From admission, onward)    Start     Dose/Rate Route Frequency Ordered Stop   09/16/21 0000  doxycycline (ADOXA) 100 MG tablet        100 mg Oral 2 times daily 09/16/21 0742     09/15/21 2000  ceFAZolin (ANCEF) IVPB 1 g/50 mL premix        1 g 100 mL/hr over 30 Minutes Intravenous Every 8 hours 09/15/21 1322 09/16/21 0425   09/15/21 0645  ceFAZolin (ANCEF) IVPB 2g/100 mL premix        2 g 200 mL/hr over 30 Minutes Intravenous On call to O.R. 09/15/21 3235 09/15/21 1050  09/15/21 0645  ceFAZolin (ANCEF) IVPB 2g/100 mL premix  Status:  Discontinued        2 g 200 mL/hr over 30 Minutes Intravenous On call to O.R. 09/15/21 4235 09/15/21 1314       Discharge Exam: Blood pressure 139/61, pulse 69, temperature 98.3 F (36.8 C), temperature source Oral, resp. rate 18, height 5\' 10"  (1.778 m), weight 87.1 kg, SpO2 96 %. Neurologic: Grossly normal Dressings dry  Discharge Medications:   Allergies as of 09/16/2021       Reactions   Codeine Itching, Nausea And Vomiting   Other Nausea Only   Anesthesia   Tramadol Nausea Only        Medication List     TAKE these medications    acetaminophen 500 MG tablet Commonly known as: TYLENOL Take 500-1,000 mg by mouth every 6 (six) hours as needed for mild pain or headache (ONLY WHEN NOT TAKING ALEVE).   aspirin EC 81 MG tablet Take 81 mg by mouth in the morning. Swallow whole.   atorvastatin 40 MG tablet Commonly known as:  LIPITOR TAKE 1 TABLET BY MOUTH ONCE DAILY AT  6  PM What changed: See the new instructions.   diphenhydrAMINE 25 MG tablet Commonly known as: BENADRYL Take 25 mg by mouth in the morning and at bedtime.   doxycycline 100 MG tablet Commonly known as: ADOXA Take 1 tablet (100 mg total) by mouth 2 (two) times daily.   finasteride 5 MG tablet Commonly known as: PROSCAR Take 5 mg by mouth at bedtime.   Fish Oil 1200 MG Caps Take 1,200 mg by mouth daily with breakfast.   FISH OIL PO Take 2,400 mg by mouth in the morning.   hydrochlorothiazide 50 MG tablet Commonly known as: HYDRODIURIL Take 50 mg by mouth at bedtime.   lisinopril 20 MG tablet Commonly known as: ZESTRIL Take 20 mg by mouth at bedtime.   naproxen sodium 220 MG tablet Commonly known as: ALEVE Take 220-440 mg by mouth 2 (two) times daily as needed (pain.).   omeprazole 40 MG capsule Commonly known as: PRILOSEC Take 1 capsule (40 mg total) by mouth every evening.   ondansetron 8 MG tablet Commonly known as: ZOFRAN Take 8 mg by mouth every 8 (eight) hours as needed for nausea or vomiting.   oxyCODONE-acetaminophen 5-325 MG tablet Commonly known as: PERCOCET/ROXICET Take 1 tablet by mouth every 4 (four) hours as needed (pain).   potassium chloride SA 20 MEQ tablet Commonly known as: KLOR-CON Take 2 tablets twice a day for 5 days, then 1 tablet daily   PRESERVISION AREDS 2 PO Take 1 capsule by mouth 2 (two) times daily.   QC TUMERIC COMPLEX PO Take 500 mg by mouth daily with breakfast.   SALONPAS PAIN RELIEF PATCH EX Place 1 patch onto the skin daily as needed (to affected area for pain).   Santyl ointment Generic drug: collagenase Apply 1 application topically in the morning and at bedtime.   Systane 0.4-0.3 % Soln Generic drug: Polyethyl Glycol-Propyl Glycol Place 1 drop into both eyes 3 (three) times daily as needed (for dryness).   tamsulosin 0.4 MG Caps capsule Commonly known as: FLOMAX Take  0.4 mg by mouth at bedtime.   tiZANidine 4 MG tablet Commonly known as: Zanaflex Take 1 tablet (4 mg total) by mouth every 8 (eight) hours as needed for muscle spasms. What changed: when to take this   traMADol 50 MG tablet Commonly known as: ULTRAM Take 1-2 tablets (  50-100 mg total) by mouth every 6 (six) hours as needed for moderate pain or severe pain. Post-operatively        Disposition: home   Final Dx: wound revision, I&D wound  Discharge Instructions      Remove dressing in 72 hours   Complete by: As directed    Call MD for:  difficulty breathing, headache or visual disturbances   Complete by: As directed    Call MD for:  persistant nausea and vomiting   Complete by: As directed    Call MD for:  redness, tenderness, or signs of infection (pain, swelling, redness, odor or green/yellow discharge around incision site)   Complete by: As directed    Call MD for:  severe uncontrolled pain   Complete by: As directed    Call MD for:  temperature >100.4   Complete by: As directed    Diet - low sodium heart healthy   Complete by: As directed    Incentive spirometry RT   Complete by: As directed    Increase activity slowly   Complete by: As directed         Follow-up Information     Eustace Moore, MD. Schedule an appointment as soon as possible for a visit in 2 week(s).   Specialty: Neurosurgery Contact information: [address] [place] [address] [place] [address] [phone]                  Signed: Eustace Moore 09/16/2021, 7:43 AM

## 2021-09-16 NOTE — Evaluation (Signed)
Occupational Therapy Evaluation Patient Details Name: Jay Tran MRN: [id number] DOB: 1934/03/19 Today's Date: 09/16/2021   History of Present Illness Pt is an 87 y/o male admitted 09/15/21 for revision of thoracic incision, Irrigation and debridement of battery pocket wound (Spine Thoracic). PMH includes:  Arthritis, BPH, Carotid stenosis (sx done feb 2021), Hyperlipemia, Hypertension, Lumbar herniated disc, Lumbar stenosis, Macular degeneration (right eye dry), and Stroke.   Clinical Impression   Pt is typically mod I for ADL and mobility (uses SPC/RW PRN) and today is supervision for mobility and transfers with RW. Pt able to recite back precautions and session focused on reviewing compensatory strategies for ADL (focus on LB - provided with long handle shoe horn)  maintaining back precautions. Pt able to demonstrate toilet transfer, peri care, sink level grooming. Wife will assist with LB dressing/bathing initially (Pt does have grabber/reacher and long handle sponge). OT education complete. Pt also performed hallway ambulation with RW successfully. OT to sign off at this time.      Recommendations for follow up therapy are one component of a multi-disciplinary discharge planning process, led by the attending physician.  Recommendations may be updated based on patient status, additional functional criteria and insurance authorization.   Follow Up Recommendations  No OT follow up;Supervision - Intermittent    Equipment Recommendations  None recommended by OT (Pt has appropriate DME)    Recommendations for Other Services       Precautions / Restrictions Precautions Precautions: Back Precaution Booklet Issued: No Precaution Comments: verbally reviewed. Pt able to state all 3 prior to education. Reviewed compensatory strategies Restrictions Weight Bearing Restrictions: No      Mobility Bed Mobility Overal bed mobility: Needs Assistance Bed Mobility: Rolling;Sidelying to  Sit Rolling: Min guard Sidelying to sit: Min assist       General bed mobility comments: re-educated on log roll to maintain back precautions. Pt required assist for trunk elevation    Transfers Overall transfer level: Needs assistance Equipment used: Rolling walker (2 wheeled) Transfers: Sit to/from [facility] Sit to Stand: Supervision Stand pivot transfers: Supervision       General transfer comment: vc for safe hand placement    Balance                                           ADL either performed or assessed with clinical judgement   ADL Overall ADL's : Needs assistance/impaired Eating/Feeding: Independent   Grooming: Supervision/safety;Standing Grooming Details (indicate cue type and reason): good adherence to back precautions Upper Body Bathing: Min guard;Standing   Lower Body Bathing: Moderate assistance;Sit to/from stand   Upper Body Dressing : Minimal assistance;Sitting   Lower Body Dressing: Moderate assistance;With caregiver independent assisting;Sit to/from stand   Toilet Transfer: Supervision/safety;Ambulation;RW   Toileting- Water quality scientist and Hygiene: Min guard;Sit to/from stand Toileting - Clothing Manipulation Details (indicate cue type and reason): reviewed compensatory strategies for rear peri care and avoid twisting Tub/ Shower Transfer: Min guard;Ambulation;Grab bars   Functional mobility during ADLs: Supervision/safety;Rolling walker General ADL Comments: decreased access post-sx to LB for ADL - wife is able to assist. good balance and power up with standing activities     Vision Baseline Vision/History: 3 Glaucoma;1 Wears glasses Ability to See in Adequate Light: 1 Impaired (baseline R eye blindness) Patient Visual Report: No change from baseline       Perception  Praxis      Pertinent Vitals/Pain Pain Assessment: 0-10 Pain Score: 2  Pain Location: incision site Pain Descriptors /  Indicators: Discomfort;Sore Pain Intervention(s): Monitored during session;Repositioned     Hand Dominance Right   Extremity/Trunk Assessment Upper Extremity Assessment Upper Extremity Assessment: Overall WFL for tasks assessed   Lower Extremity Assessment Lower Extremity Assessment: Overall WFL for tasks assessed   Cervical / Trunk Assessment Cervical / Trunk Assessment: Other exceptions Cervical / Trunk Exceptions: s/p sx   Communication Communication Communication: HOH (hearing aides present during session)   Cognition Arousal/Alertness: Awake/alert Behavior During Therapy: WFL for tasks assessed/performed Overall Cognitive Status: Within Functional Limits for tasks assessed                                     General Comments  VSS throughout session    Exercises     Shoulder Instructions      Home Living Family/patient expects to be discharged to:: Private residence Living Arrangements: Spouse/significant other Available Help at Discharge: Family;Available 24 hours/day Type of Home: House Home Access: Stairs to enter [employer] of Steps: 4 Entrance Stairs-Rails: Left Home Layout: Able to live on main level with bedroom/bathroom     Bathroom Shower/Tub: Teacher, early years/pre: Handicapped height Bathroom Accessibility: Yes How Accessible: Accessible via walker Home Equipment: [place] - 2 wheels;Cane - single point;Grab bars - tub/shower;Hand held shower head          Prior Functioning/Environment Level of Independence: Independent                 OT Problem List: Decreased activity tolerance;Impaired balance (sitting and/or standing);Decreased knowledge of precautions;Pain      OT Treatment/Interventions:      OT Goals(Current goals can be found in the care plan section) Acute Rehab OT Goals Patient Stated Goal: get home and get some sleep OT Goal Formulation: With patient Time For Goal Achievement:  09/30/21 Potential to Achieve Goals: Good  OT Frequency:     Barriers to D/C:            Co-evaluation              AM-PAC OT "6 Clicks" Daily Activity     Outcome Measure Help from another person eating meals?: None Help from another person taking care of personal grooming?: A Little Help from another person toileting, which includes using toliet, bedpan, or urinal?: A Little Help from another person bathing (including washing, rinsing, drying)?: A Little Help from another person to put on and taking off regular upper body clothing?: A Little Help from another person to put on and taking off regular lower body clothing?: A Lot 6 Click Score: 18   End of Session Equipment Utilized During Treatment: Gait belt;Rolling walker Nurse Communication: Mobility status;Other (comment) (requests nausea medication)  Activity Tolerance: Patient tolerated treatment well Patient left: in chair;with call bell/phone within reach  OT Visit Diagnosis: Other abnormalities of gait and mobility (R26.89)                Time: 9390-3009 OT Time Calculation (min): 22 min Charges:  OT General Charges $OT Visit: 1 Visit OT Evaluation $OT Eval Low Complexity: [place] OTR/L Acute Rehabilitation Services Pager: [phone] Office: [place] 09/16/2021, 8:56 AM

## 2021-09-20 LAB — AEROBIC/ANAEROBIC CULTURE W GRAM STAIN (SURGICAL/DEEP WOUND): Gram Stain: NONE SEEN

## 2021-12-30 ENCOUNTER — Other Ambulatory Visit: Payer: Self-pay

## 2021-12-30 DIAGNOSIS — I6523 Occlusion and stenosis of bilateral carotid arteries: Secondary | ICD-10-CM

## 2022-01-11 ENCOUNTER — Other Ambulatory Visit: Payer: Self-pay

## 2022-01-11 ENCOUNTER — Ambulatory Visit (HOSPITAL_COMMUNITY)
Admission: RE | Admit: 2022-01-11 | Discharge: 2022-01-11 | Disposition: A | Discharge status: Home / Self Care | Payer: Medicare Other | Source: Ambulatory Visit | Attending: Vascular Surgery | Admitting: Vascular Surgery

## 2022-01-11 ENCOUNTER — Ambulatory Visit (INDEPENDENT_AMBULATORY_CARE_PROVIDER_SITE_OTHER): Payer: Medicare Other | Admitting: Physician Assistant

## 2022-01-11 VITALS — BP 164/75 | HR 63 | Temp 97.8°F | Ht 70.0 in | Wt 199.0 lb

## 2022-01-11 DIAGNOSIS — I6523 Occlusion and stenosis of bilateral carotid arteries: Secondary | ICD-10-CM | POA: Diagnosis present

## 2022-01-11 NOTE — Progress Notes (Signed)
Office Note     CC:  follow up Requesting Provider:  Leonard Downing, *  HPI: Jay Tran is a 88 y.o. (1934-06-06) male who presents for surveillance of carotid artery stenosis.  He underwent right carotid endarterectomy by Dr. Trula Slade on 02/07/2020 due to symptomatic stenosis and right retinal artery occlusion.  He has a persistent vision deficit in his right eye.  He denies any CVA or TIA since last office visit.  He also denies any current strokelike symptoms including slurring speech, further changes in vision, or one-sided weakness.  He is on an aspirin and a statin daily.  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 (HCC)    Hx: of after 2013 back surgery   GERD (gastroesophageal reflux disease)    Hearing aid worn    Hx: of right ear   Hyperlipemia    Hypertension    Lumbar herniated disc    Lumbar stenosis    Macular degeneration right eye dry   PONV (postoperative nausea and vomiting)    takes iv nausea meds    Seasonal allergies    Hx: of   Stroke [facility])     Past Surgical History:  Procedure Laterality Date   BACK SURGERY  2013, x 3 with dr Ronnald Ramp 2014, 2016 and 2019   x 4, takes back injections monthly   COLONOSCOPY     Hx: of   ENDARTERECTOMY Right 02/07/2020   Procedure: ENDARTERECTOMY CAROTID;  Surgeon: Serafina Mitchell, MD;  Location: [place];  Service: Vascular;  Laterality: Right;   EYE SURGERY     bil catarcts 02/2016   HERNIA REPAIR  several yrs ago   inguinal   LUMBAR LAMINECTOMY  10/26/2012   Procedure: MICRODISCECTOMY LUMBAR LAMINECTOMY;  Surgeon: Marybelle Killings, MD;  Location: [place];  Service: Orthopedics;  Laterality: Right;  Right L4-5 Microdiscectomy   LUMBAR WOUND DEBRIDEMENT N/A 09/15/2021   Procedure: revision of thoracic incision, Irrigation and debridement of battery pocket wound;  Surgeon: Eustace Moore, MD;  Location: [place];  Service: Neurosurgery;  Laterality: N/A;   MAXIMUM ACCESS (MAS)POSTERIOR LUMBAR INTERBODY FUSION (PLIF) 1 LEVEL N/A 04/15/2015   Procedure: Maximum Access Surgery Posterior Lumbar Interbody Fusion Lumbar two-Lumbar three extension of hardware;  Surgeon: Eustace Moore, MD;  Location: [place];  Service: Neurosurgery;  Laterality: N/A;   PATCH ANGIOPLASTY Right 02/07/2020   Procedure: PATCH ANGIOPLASTY USING Rueben Bash BIOLOGIC PATCH;  Surgeon: Serafina Mitchell, MD;  Location: [facility];  Service: Vascular;  Laterality: Right;   ROTATOR CUFF REPAIR     right shoulder - 3 times   SPINAL CORD STIMULATOR INSERTION  08/11/2021   TONSILLECTOMY  as child   TRANSURETHRAL RESECTION OF BLADDER TUMOR N/A 02/24/2017   Procedure: TRANSURETHRAL RESECTION OF BLADDER TUMOR (TURBT);  Surgeon: Alexis Frock, MD;  Location: [facility];  Service: Urology;  Laterality: N/A;   TRANSURETHRAL RESECTION OF PROSTATE N/A 11/20/2020   Procedure: TRANSURETHRAL RESECTION OF THE PROSTATE (TURP);  Surgeon: Alexis Frock, MD;  Location: [facility];  Service: Urology;  Laterality: N/A;    Social History   Socioeconomic History   Marital status: Married    Spouse name: [name]   Number of children: Not on file   Years of education: Not on file   Highest  education level: Not on file  Occupational History   Not on file  Tobacco Use   Smoking status: Former    Packs/day: 3.00    Years: 5.00    Pack years: 15.00    Types: Cigarettes    Quit date: 11/12/1963    Years since quitting: 58.2   Smokeless tobacco: Never  Vaping Use   Vaping Use: Never used  Substance and Sexual Activity   Alcohol use: No   Drug use: No   Sexual activity: Yes  Other Topics Concern   Not on file  Social History Narrative   Lives with wife   Social Determinants of Health   Financial Resource Strain: Not on file  Food Insecurity: Not on file  Transportation Needs: Not on file  Physical Activity:  Not on file  Stress: Not on file  Social Connections: Not on file  Intimate Partner Violence: Not on file    Family History  Problem Relation Age of Onset   Stroke Mother    Cancer - Prostate Father     Current Outpatient Medications  Medication Sig Dispense Refill   acetaminophen (TYLENOL) 500 MG tablet Take 500-1,000 mg by mouth every 6 (six) hours as needed for mild pain or headache (ONLY WHEN NOT TAKING ALEVE).     aspirin EC 81 MG tablet Take 81 mg by mouth in the morning. Swallow whole.     atorvastatin (LIPITOR) 40 MG tablet TAKE 1 TABLET BY MOUTH ONCE DAILY AT  6  PM (Patient taking differently: Take 40 mg by mouth at bedtime.) 90 tablet 3   diphenhydrAMINE (BENADRYL) 25 MG tablet Take 25 mg by mouth in the morning and at bedtime.     doxycycline (ADOXA) 100 MG tablet Take 1 tablet (100 mg total) by mouth 2 (two) times daily. 20 tablet 0   finasteride (PROSCAR) 5 MG tablet Take 5 mg by mouth at bedtime.      hydrochlorothiazide (HYDRODIURIL) 50 MG tablet Take 50 mg by mouth at bedtime.      Liniments (SALONPAS PAIN RELIEF PATCH EX) Place 1 patch onto the skin daily as needed (to affected area for pain).      lisinopril (PRINIVIL,ZESTRIL) 20 MG tablet Take 20 mg by mouth at bedtime.      Multiple Vitamins-Minerals (PRESERVISION AREDS 2 PO) Take 1 capsule by mouth 2 (two) times daily.      naproxen sodium (ALEVE) 220 MG tablet Take 220-440 mg by mouth 2 (two) times daily as needed (pain.).     Omega-3 Fatty Acids (FISH OIL PO) Take 2,400 mg by mouth in the morning.     Omega-3 Fatty Acids (FISH OIL) 1200 MG CAPS Take 1,200 mg by mouth daily with breakfast.      ondansetron (ZOFRAN) 8 MG tablet Take 8 mg by mouth every 8 (eight) hours as needed for nausea or vomiting.     SYSTANE 0.4-0.3 % SOLN Place 1 drop into both eyes 3 (three) times daily as needed (for dryness).     Tamsulosin HCl (FLOMAX) 0.4 MG CAPS Take 0.4 mg by mouth at bedtime.      tiZANidine (ZANAFLEX) 4 MG tablet  Take 1 tablet (4 mg total) by mouth every 8 (eight) hours as needed for muscle spasms. (Patient taking differently: Take 4 mg by mouth at bedtime.) 60 tablet 1   traMADol (ULTRAM) 50 MG tablet Take 1-2 tablets (50-100 mg total) by mouth every 6 (six) hours as needed for moderate pain or severe  pain. Post-operatively 20 tablet 0   Turmeric (QC TUMERIC COMPLEX PO) Take 500 mg by mouth daily with breakfast.     No current facility-administered medications for this visit.    Allergies  Allergen Reactions   Codeine Itching and Nausea And Vomiting   Other Nausea Only    Anesthesia   Tramadol Nausea Only     REVIEW OF SYSTEMS:   [X]  denotes positive finding, [ ]  denotes negative finding Cardiac  Comments:  Chest pain or chest pressure:    Shortness of breath upon exertion:    Short of breath when lying flat:    Irregular heart rhythm:        Vascular    Pain in calf, thigh, or hip brought on by ambulation:    Pain in feet at night that wakes you up from your sleep:     Blood clot in your veins:    Leg swelling:         Pulmonary    Oxygen at home:    Productive cough:     Wheezing:         Neurologic    Sudden weakness in arms or legs:     Sudden numbness in arms or legs:     Sudden onset of difficulty speaking or slurred speech:    Temporary loss of vision in one eye:     Problems with dizziness:         Gastrointestinal    Blood in stool:     Vomited blood:         Genitourinary    Burning when urinating:     Blood in urine:        Psychiatric    Major depression:         Hematologic    Bleeding problems:    Problems with blood clotting too easily:        Skin    Rashes or ulcers:        Constitutional    Fever or chills:      PHYSICAL EXAMINATION:  Vitals:   01/11/22 1044 01/11/22 1052  BP: (!) 173/76 (!) 164/75  Pulse: 65 63  Temp: 97.8 F (36.6 C)   Weight: 199 lb (90.3 kg)   Height: 5\' 10"  (1.778 m)     General:  WDWN in NAD; vital signs  documented above Gait: Not observed HENT: WNL, normocephalic Pulmonary: normal non-labored breathing , without Rales, rhonchi,  wheezing Cardiac: regular HR Abdomen: soft, NT, no masses Skin: without rashes Vascular Exam/Pulses:  Right Left  Radial 2+ (normal) 2+ (normal)  DP 2+ (normal) 2+ (normal)   Extremities: without ischemic changes, without Gangrene , without cellulitis; without open wounds;  Musculoskeletal: no muscle wasting or atrophy  Neurologic: A&O X 3;  CN grossly intact Psychiatric:  The pt has Normal affect.   Non-Invasive Vascular Imaging:   R ICA endarterectomy site without hemodynamically significant stenosis Left ICA 1 to 39% stenosis    ASSESSMENT/PLAN:: 88 y.o. male here for follow up for surveillance of carotid artery stenosis status post right carotid endarterectomy  -Persistent right eye vision deficit however this is unchanged since retinal artery occlusion -Carotid duplex demonstrates widely patent right endarterectomy site.  Also estimating 1 to 39% stenosis of left ICA -Continue aspirin and statin daily -Recheck carotid duplex in 1 year   Dagoberto Ligas, PA-C [facility] [phone]  Clinic MD:   Stanford Breed

## 2022-01-26 ENCOUNTER — Encounter: Payer: Self-pay | Admitting: Adult Health

## 2022-01-26 ENCOUNTER — Other Ambulatory Visit: Payer: Self-pay

## 2022-01-26 ENCOUNTER — Ambulatory Visit (INDEPENDENT_AMBULATORY_CARE_PROVIDER_SITE_OTHER): Payer: Medicare Other | Admitting: Adult Health

## 2022-01-26 ENCOUNTER — Encounter (INDEPENDENT_AMBULATORY_CARE_PROVIDER_SITE_OTHER): Payer: Medicare Other | Admitting: Ophthalmology

## 2022-01-26 VITALS — BP 150/68 | HR 62 | Ht 68.0 in | Wt 200.0 lb

## 2022-01-26 DIAGNOSIS — Z9889 Other specified postprocedural states: Secondary | ICD-10-CM

## 2022-01-26 DIAGNOSIS — I10 Essential (primary) hypertension: Secondary | ICD-10-CM

## 2022-01-26 DIAGNOSIS — H47211 Primary optic atrophy, right eye: Secondary | ICD-10-CM

## 2022-01-26 DIAGNOSIS — H35033 Hypertensive retinopathy, bilateral: Secondary | ICD-10-CM

## 2022-01-26 DIAGNOSIS — H3411 Central retinal artery occlusion, right eye: Secondary | ICD-10-CM

## 2022-01-26 DIAGNOSIS — H353211 Exudative age-related macular degeneration, right eye, with active choroidal neovascularization: Secondary | ICD-10-CM | POA: Diagnosis not present

## 2022-01-26 DIAGNOSIS — H353122 Nonexudative age-related macular degeneration, left eye, intermediate dry stage: Secondary | ICD-10-CM

## 2022-01-26 DIAGNOSIS — H43813 Vitreous degeneration, bilateral: Secondary | ICD-10-CM

## 2022-01-26 NOTE — Progress Notes (Signed)
[facility] [address] [place] [address] [phone]       STROKE FOLLOW UP NOTE  [name] Date of Birth:  12-18-1934 Medical Record Number:  [id number]   Reason for Referral: stroke follow up    CHIEF COMPLAINT:  Chief Complaint  Patient presents with   Follow-up    Rm 3 with spouse Joann  Pt is well, still having R eye vision loss and imbalance. Overall stable.      HPI:  Update 01/26/2022 JM: Returns for 74-month stroke follow-up.  Overall stable without new stroke/TIA symptoms.  Persistent right eye visual loss, stable, routinely followed by ophthalmology. C/o imbalance which is chronic with low back and hip pain and continued use of cane - deneis any recent falls. Followed by neurosurgery.  Compliant on aspirin and atorvastatin without side effects.  Blood pressure today 168/80 and on recheck 150/68. Does not routinely monitor at home but reports at recent office visits, BP stable.  No further concerns at this time.     History provided for reference purposes only Update 07/28/2021 JM: Jay Tran returns for 48-month stroke follow-up accompanied by his wife.  Overall well from stroke standpoint.  Residual OD vision loss - routinely followed by ophthalmology.  Denies new stroke/TIA symptoms.  Compliant on aspirin and atorvastatin -denies side effects.  Blood pressure today 124/65. He was evaluated in ED on 7/19 for generalized weakness - dx'd with COVID and hypokalemia -remains on potassium supplement monitored by PCP.  He is scheduled to have spinal stimulator placed 8/24 by Dr. Ronnald Ramp - he questions holding aspirin for 7 days prior. He is now using a cane due to lower back pain which has greatly limited his ambulation and activity.  No further concerns at this time.  Update 01/27/2021 JM: Jay Tran returns for 71-month stroke follow-up unaccompanied. Stable without new stroke/TIA symptoms and reports residual visual impairment right eye which has  been stable without worsening. Continues to follow routinely with ophthalmology. Per patient, reports 95% visual loss although he was cleared to drive which he has been doing without difficulty.  Remains on aspirin and atorvastatin tolerating without side effects. Blood pressure today 140/68. Recent carotid ultrasound with Dr. Trula Slade showed widely patent right ICA s/p CEA and left ICA 1 to 39% stenosis. No concerns at this time.  Update 07/27/2020 JM: Jay Tran returns for follow-up regarding OD CRAO in 01/2020 accompanied by his wife.   Residual deficits right eye visual impairment.  Denies worsening.  Continues to follow closely with ophthalmology currently receiving injections in right eye for macular degeneration.  Per wife, ophthalmologist reported improvement of vision post CRAO therefore cleared to restart injections.  Continues ADLs independently but unable to drive due to visual impairment Remains on aspirin 81 mg daily and atorvastatin without side effects currently managed by PCP.  Blood pressure today 130/82.  Denies new or worsening stroke/TIA symptoms or additional visual symptoms.   Initial visit 03/11/2020 JM: Since discharge, he has been stable.  He does report right eye blurred vision started improving approximately 2 to 3 days ago and since that time has been experiencing 15-20 second dull right upper orbital pain and then will have slightly worsened blurred vision for a short duration but denies worsening vision from prior.  He does plan on follow-up visit tomorrow with ophthalmologist Dr. Zigmund Daniel.  He does report initial difficulty with depth perception and ambulation due to likely combination of visual impairment and lower back pain.  He is currently ambulating with cane and denies any recent fall.  It was recommended to participate in home health therapies but unfortunately they had difficulty with obtaining orders therefore therapy was not pursued.  He has continued on aspirin 81 mg daily  and atorvastatin for secondary stroke prevention without side effects.  Blood pressure today 134/68.  He did have follow-up with [facility] Dr. Trula Slade who recommended repeat carotid ultrasound in 6 to 9 months.  No further concerns at this time.  Hospital admission summary 02/04/2020:  Jay Tran is a 88 y.o. male with history of lumbar stenosis, hypertension, hyperlipidemia, DVT of lower extremity presented on 02/04/2020 after waking with painless OD vision loss.  Evaluated by stroke team and Dr. 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  He underwent right carotid endarterectomy without complication for symptomatic moderate proximal right carotid stenosis.  Recommended continuation of aspirin 81 mg daily and initiated atorvastatin 40 mg daily.  History of HTN and HLD.  He was discharged home in stable condition with recommendation of home health therapy.  CRAO OD, likely thromboembolic from unstable R ICA stenosis   CT head No acute abnormality. Small vessel disease. Atrophy.    MRI  No acute abnormality. Small vessel disease.  CTA head no significant stenosis CTA neck proximal R ICA 58% stenosis w/ mixed atherosclerosis  Carotid Doppler  R ICA 40-59% stenosis  2D Echo ejection fraction 60 to 65%.  No cardiac source of embolism.   LDL 128 -initiated atorvastatin 40 mg daily HgbA1c 5.4 Lovenox 40 mg sq daily for VTE prophylaxis No antithrombotic prior to admission, now on aspirin 324 mg daily. Change to aspirin 81 and plavix 75 my daily. Continue DAPT x 3 weeks then aspirin alone   Therapy recommendations:  [facility] PT and likely [place] OT Disposition:  Return home [place] for d/c today      ROS:   14 system review of systems performed and negative with exception of those listed in HPI  PMH:  Past Medical History:  Diagnosis Date   Arthritis    BPH (benign prostatic hypertrophy)    Carotid stenosis sx done feb 2021   right    Deep vein thrombosis of  right lower extremity ([place])    Hx: of after 2013 back surgery   GERD (gastroesophageal reflux disease)    Hearing aid worn    Hx: of right ear   Hyperlipemia    Hypertension    Lumbar herniated disc    Lumbar stenosis    Macular degeneration right eye dry   PONV (postoperative nausea and vomiting)    takes iv nausea meds    Seasonal allergies    Hx: of   Stroke [facility])     PSH:  Past Surgical History:  Procedure Laterality Date   BACK SURGERY  2013, x 3 with dr Ronnald Ramp 2014, 2016 and 2019   x 4, takes back injections monthly   COLONOSCOPY     Hx: of   ENDARTERECTOMY Right 02/07/2020   Procedure: ENDARTERECTOMY CAROTID;  Surgeon: Serafina Mitchell, MD;  Location: [place];  Service: Vascular;  Laterality: Right;   EYE SURGERY     bil catarcts 02/2016   HERNIA REPAIR  several yrs ago   inguinal   LUMBAR LAMINECTOMY  10/26/2012   Procedure: MICRODISCECTOMY LUMBAR LAMINECTOMY;  Surgeon: Marybelle Killings, MD;  Location: [place];  Service: Orthopedics;  Laterality: Right;  Right L4-5 Microdiscectomy  LUMBAR WOUND DEBRIDEMENT N/A 09/15/2021   Procedure: revision of thoracic incision, Irrigation and debridement of battery pocket wound;  Surgeon: Eustace Moore, MD;  Location: [place];  Service: Neurosurgery;  Laterality: N/A;   MAXIMUM ACCESS (MAS)POSTERIOR LUMBAR INTERBODY FUSION (PLIF) 1 LEVEL N/A 04/15/2015   Procedure: Maximum Access Surgery Posterior Lumbar Interbody Fusion Lumbar two-Lumbar three extension of hardware;  Surgeon: Eustace Moore, MD;  Location: [facility];  Service: Neurosurgery;  Laterality: N/A;   PATCH ANGIOPLASTY Right 02/07/2020   Procedure: PATCH ANGIOPLASTY USING Rueben Bash BIOLOGIC PATCH;  Surgeon: Serafina Mitchell, MD;  Location: [facility];  Service: Vascular;  Laterality: Right;   ROTATOR CUFF REPAIR     right shoulder - 3 times   SPINAL CORD STIMULATOR INSERTION  08/11/2021   TONSILLECTOMY  as child   TRANSURETHRAL RESECTION OF BLADDER TUMOR N/A 02/24/2017   Procedure:  TRANSURETHRAL RESECTION OF BLADDER TUMOR (TURBT);  Surgeon: Alexis Frock, MD;  Location: [facility];  Service: Urology;  Laterality: N/A;   TRANSURETHRAL RESECTION OF PROSTATE N/A 11/20/2020   Procedure: TRANSURETHRAL RESECTION OF THE PROSTATE (TURP);  Surgeon: Alexis Frock, MD;  [facility];  Service: Urology;  Laterality: N/A;    Social History:  Social History   Socioeconomic History   Marital status: Married    Spouse name: [name]   Number of children: Not on file   Years of education: Not on file   Highest education level: Not on file  Occupational History   Not on file  Tobacco Use   Smoking status: Former    Packs/day: 3.00    Years: 5.00    Pack years: 15.00    Types: Cigarettes    Quit date: 11/12/1963    Years since quitting: 58.2   Smokeless tobacco: Never  Vaping Use   Vaping Use: Never used  Substance and Sexual Activity   Alcohol use: No   Drug use: No   Sexual activity: Yes  Other Topics Concern   Not on file  Social History Narrative   Lives with wife   Social Determinants of Health   Financial Resource Strain: Not on file  Food Insecurity: Not on file  Transportation Needs: Not on file  Physical Activity: Not on file  Stress: Not on file  Social Connections: Not on file  Intimate Partner Violence: Not on file    Family History:  Family History  Problem Relation Age of Onset   Stroke Mother    Cancer - Prostate Father     Medications:   Current Outpatient Medications on File Prior to Visit  Medication Sig Dispense Refill   acetaminophen (TYLENOL) 500 MG tablet Take 500-1,000 mg by mouth every 6 (six) hours as needed for mild pain or headache (ONLY WHEN NOT [place]).     aspirin EC 81 MG tablet Take 81 mg by mouth in the morning. Swallow whole.     atorvastatin (LIPITOR) 40 MG tablet TAKE 1 TABLET BY MOUTH ONCE DAILY AT  6  PM (Patient taking differently: Take 40 mg by mouth at bedtime.) 90 tablet 3    diphenhydrAMINE (BENADRYL) 25 MG tablet Take 25 mg by mouth in the morning and at bedtime.     finasteride (PROSCAR) 5 MG tablet Take 5 mg by mouth at bedtime.      hydrochlorothiazide (HYDRODIURIL) 50 MG tablet Take 50 mg by mouth at bedtime.      Liniments (SALONPAS PAIN RELIEF PATCH EX) Place 1 patch onto  the skin daily as needed (to affected area for pain).      lisinopril (PRINIVIL,ZESTRIL) 20 MG tablet Take 20 mg by mouth at bedtime.      Multiple Vitamins-Minerals (PRESERVISION AREDS 2 PO) Take 1 capsule by mouth 2 (two) times daily.      naproxen sodium (ALEVE) 220 MG tablet Take 220-440 mg by mouth 2 (two) times daily as needed (pain.).     Omega-3 Fatty Acids (FISH OIL) 1200 MG CAPS Take 1,200 mg by mouth daily with breakfast.      ondansetron (ZOFRAN) 8 MG tablet Take 8 mg by mouth every 8 (eight) hours as needed for nausea or vomiting.     SYSTANE 0.4-0.3 % SOLN Place 1 drop into both eyes 3 (three) times daily as needed (for dryness).     Tamsulosin HCl (FLOMAX) 0.4 MG CAPS Take 0.4 mg by mouth at bedtime.      tiZANidine (ZANAFLEX) 4 MG tablet Take 1 tablet (4 mg total) by mouth every 8 (eight) hours as needed for muscle spasms. (Patient taking differently: Take 4 mg by mouth at bedtime.) 60 tablet 1   Turmeric (QC TUMERIC COMPLEX PO) Take 500 mg by mouth daily with breakfast.     No current facility-administered medications on file prior to visit.    Allergies:   Allergies  Allergen Reactions   Codeine Itching and Nausea And Vomiting   Other Nausea Only    Anesthesia   Tramadol Nausea Only     Physical Exam  Vitals:   01/26/22 1112 01/26/22 1209  BP: (!) 168/80 (!) 150/68  Pulse: 62   Weight: 200 lb (90.7 kg)   Height: 5\' 8"  (1.727 m)    Body mass index is 30.41 kg/m. No results found.   General: well developed, well nourished, pleasant elderly Caucasian male, seated, in no evident distress Neck: supple with no carotid or supraclavicular  bruits Cardiovascular: regular rate and rhythm, no murmurs Vascular:  Normal pulses all extremities   Neurologic Exam Mental Status: Awake and fully alert. Fluent speech and language. Oriented to place and time. Recent and remote memory intact. Attention span, concentration and fund of knowledge appropriate. Mood and affect appropriate.  Cranial Nerves: Pupils equal, briskly reactive to light. Extraocular movements full without nystagmus. Visual fields full to confrontation OS and OD superior and inferior nasal and inferior temporal visual loss and decreased vision superior temporal.  HOH bilaterally. Facial sensation intact. Face, tongue, palate moves normally and symmetrically.  Motor: Normal bulk and tone. Normal strength in all tested extremity muscles. Sensory.: intact to touch , pinprick , position and vibratory sensation.  Coordination: Rapid alternating movements normal in all extremities. Finger-to-nose and heel-to-shin performed accurately bilaterally. Gait and Station: Arises from chair without difficulty. Stance is normal. Gait demonstrates slow cautious steps with mild unsteadiness and use of cane due to back pain Reflexes: 1+ and symmetric. Toes downgoing.        ASSESSMENT/PLAN: MADISON ALBEA is a 88 y.o. year old male presented with painless OD vision loss on 02/04/2020 with CRAO OD likely thromboembolic, unstable right ICA stenosis s/p right carotid endarterectomy. Vascular risk factors include HTN, HLD and history of provoked RLE DVT.      CRAO OD :  Residual deficits: OD visual impairment -stable Continue aspirin 81 mg daily  and atorvastatin for secondary stroke prevention.  Discussed secondary stroke prevention measures and importance of close PCP follow-up for aggressive stroke risk factor management including HTN with BP goal<130/90 and  HLD with LDL goal<70  Right ICA stenosis s/p CEA:  Carotid duplex 12/2020 and 12/2021 right ICA no stenosis and left ICA 1 to 39%  stenosis Monitored per Dr. Trula Slade with plans on repeating carotid ultrasound in 1 year     Overall stable without further recommendations from stroke standpoint. Follow up as needed   CC:  Leonard Downing, MD    I spent 31 minutes of face-to-face and non-face-to-face time with patient and wife.  This included previsit chart review, lab review, study review, electronic health record documentation, patient education regarding history of OD CRAO, right ICA stenosis s/p CEA, residual deficits, secondary stroke prevention measures and importance of managing stroke risk factors and answered all questions to patient and wife's satisfaction  Frann Rider, AGNP-BC  [facility] [address] [place], [place] [address]  Phone [phone] Fax [phone] Note: This document was prepared with digital dictation and possible smart phrase technology. Any transcriptional errors that result from this process are unintentional.

## 2022-01-26 NOTE — Patient Instructions (Signed)
Continue aspirin 81 mg daily  and atorvastatin  for secondary stroke prevention  Continue to follow up with PCP regarding cholesterol and blood pressure management  Maintain strict control of hypertension with blood pressure goal below 130/90 and cholesterol with LDL cholesterol (bad cholesterol) goal below 70 mg/dL.   Signs of a Stroke? Follow the BEFAST method:  Balance Watch for a sudden loss of balance, trouble with coordination or vertigo Eyes Is there a sudden loss of vision in one or both eyes? Or double vision?  Face: Ask the person to smile. Does one side of the face droop or is it numb?  Arms: Ask the person to raise both arms. Does one arm drift downward? Is there weakness or numbness of a leg? Speech: Ask the person to repeat a simple phrase. Does the speech sound slurred/strange? Is the person confused ? Time: If you observe any of these signs, call 911.       Thank you for coming to see us at [facility]. I hope we have been able to provide you high quality care today.  You may receive a patient satisfaction survey over the next few weeks. We would appreciate your feedback and comments so that we may continue to improve ourselves and the health of our patients.  

## 2022-03-02 ENCOUNTER — Other Ambulatory Visit: Payer: Self-pay | Admitting: Adult Health

## 2022-03-08 ENCOUNTER — Other Ambulatory Visit: Payer: Self-pay | Admitting: Urology

## 2022-03-08 DIAGNOSIS — N644 Mastodynia: Secondary | ICD-10-CM

## 2022-03-21 ENCOUNTER — Ambulatory Visit
Admission: RE | Admit: 2022-03-21 | Discharge: 2022-03-21 | Disposition: A | Discharge status: Home / Self Care | Payer: Medicare Other | Source: Ambulatory Visit | Attending: Urology | Admitting: Urology

## 2022-03-21 ENCOUNTER — Ambulatory Visit: Payer: Medicare Other

## 2022-03-21 DIAGNOSIS — N644 Mastodynia: Secondary | ICD-10-CM

## 2022-03-30 ENCOUNTER — Ambulatory Visit (INDEPENDENT_AMBULATORY_CARE_PROVIDER_SITE_OTHER): Payer: Medicare Other

## 2022-03-30 ENCOUNTER — Ambulatory Visit (INDEPENDENT_AMBULATORY_CARE_PROVIDER_SITE_OTHER): Payer: Medicare Other | Admitting: Orthopaedic Surgery

## 2022-03-30 ENCOUNTER — Other Ambulatory Visit: Payer: Self-pay

## 2022-03-30 DIAGNOSIS — M1611 Unilateral primary osteoarthritis, right hip: Secondary | ICD-10-CM | POA: Diagnosis not present

## 2022-03-30 DIAGNOSIS — M25551 Pain in right hip: Secondary | ICD-10-CM

## 2022-03-30 DIAGNOSIS — I6523 Occlusion and stenosis of bilateral carotid arteries: Secondary | ICD-10-CM | POA: Diagnosis not present

## 2022-03-30 NOTE — Progress Notes (Signed)
The patient is a very pleasant 88 year old gentleman who comes in for evaluation treatment of chronic right hip pain.  He says is worse with walking and some with laying down as well.  He does report some groin pain.  He has had at least 3 different spine surgeries and even has a spinal cord stimulator.  He has had injections in his back before but never in the right hip.  He denies any injury to that hip.  It is little bit more stiff with putting his shoes and socks on and crossing his legs.  He is not on blood thinning medication.  He denies being a diabetic.  I was able to review his medications and medical history in epic.  His wife is with her today as well.  He does not walk with an assistive device. ? ?Examination his left hip is normal.  He has good rotation of the left hip with no pain.  Examination of right hip shows just some slight stiffness with internal and external rotation and some slight pain in the groin with rotation.  He does report pain that radiates past his knee.  He does have a component of low back pain to the right side but more this seems to be in the hip itself. ? ?An AP pelvis and lateral right hip shows significant arthritic findings of the right hip with joint space narrowing compared to the normal-appearing left side.  There are also periarticular osteophytes. ? ?I would like to send him to Dr. Ernestina Patches for a one-time intra-articular injection of a steroid under fluoroscopy in the right hip joint.  This can be both diagnostic and therapeutic.  He and his wife agree with this treatment plan.  Once that injection is obtained I would have Dr. Romona Curls section get him back to [place] about 2 weeks later.  All questions and concerns were answered and addressed. ?

## 2022-04-06 ENCOUNTER — Telehealth: Payer: Self-pay | Admitting: Physical Medicine and Rehabilitation

## 2022-04-06 NOTE — Telephone Encounter (Signed)
Pt's wife Arville Go called for a referral for right hip injection. Please call her at [phone]. ?

## 2022-04-11 ENCOUNTER — Other Ambulatory Visit: Payer: Self-pay | Admitting: Orthopaedic Surgery

## 2022-04-11 ENCOUNTER — Telehealth: Payer: Self-pay | Admitting: Orthopaedic Surgery

## 2022-04-11 MED ORDER — TRAMADOL HCL 50 MG PO TABS: 50.0000 mg | ORAL_TABLET | Freq: Four times a day (QID) | ORAL | 0 refills | Status: DC | PRN

## 2022-04-11 NOTE — Telephone Encounter (Signed)
Patient's wife Arville Go called asked if something for pain can be called in for the patient. Patient uses [facility] on [address]. Patient was taking Tramadol '50mg'$ . The number to contact patient is [phone]. ?

## 2022-04-11 NOTE — Telephone Encounter (Signed)
Please advise 

## 2022-04-26 ENCOUNTER — Ambulatory Visit (INDEPENDENT_AMBULATORY_CARE_PROVIDER_SITE_OTHER): Payer: Medicare Other | Admitting: Orthopaedic Surgery

## 2022-04-26 ENCOUNTER — Ambulatory Visit (INDEPENDENT_AMBULATORY_CARE_PROVIDER_SITE_OTHER): Payer: Medicare Other

## 2022-04-26 ENCOUNTER — Encounter: Payer: Self-pay | Admitting: Orthopaedic Surgery

## 2022-04-26 DIAGNOSIS — M25512 Pain in left shoulder: Secondary | ICD-10-CM | POA: Diagnosis not present

## 2022-04-26 DIAGNOSIS — G8929 Other chronic pain: Secondary | ICD-10-CM

## 2022-04-26 DIAGNOSIS — I6523 Occlusion and stenosis of bilateral carotid arteries: Secondary | ICD-10-CM | POA: Diagnosis not present

## 2022-04-26 NOTE — Progress Notes (Signed)
? ?Office Visit Note ?  ?Patient: Jay Tran           ?Date of Birth: September 28, 1934           ?MRN: [id number] ?Visit Date: 04/26/2022 ?             ?Requested by: Leonard Downing, MD ?[address] ?[place],  [place] [address] ?PCP: Leonard Downing, MD ? ? ?Assessment & Plan: ?Visit Diagnoses:  ?1. Chronic left shoulder pain   ? ? ?Plan: Impression is left shoulder strain.  AC separation is chronic from injury 15 years ago.  Recommend symptomatic treatment and increase activity as tolerated as symptoms improved. ? ?Follow-Up Instructions: No follow-ups on file.  ? ?Orders:  ?Orders Placed This Encounter  ?Procedures  ? XR Shoulder Left  ? ?No orders of the defined types were placed in this encounter. ? ? ? ? Procedures: ?No procedures performed ? ? ?Clinical Data: ?No additional findings. ? ? ?Subjective: ?Chief Complaint  ?Patient presents with  ? Left Shoulder - Pain  ? ? ?HPI ? ?Jay Tran is a very pleasant 88 year old gentleman here for acute left shoulder injury.  He fell while washing his truck today.  EMS was called to help him get up.  Has a chronic left AC separation from 15 years ago.  Feels like the [facility] separation is not any worse. ? ?Review of Systems  ?Constitutional: Negative.   ?All other systems reviewed and are negative. ? ? ?Objective: ?Vital Signs: There were no vitals taken for this visit. ? ?Physical Exam ?Vitals and nursing note reviewed.  ?Constitutional:   ?   Appearance: He is well-developed.  ?Pulmonary:  ?   Effort: Pulmonary effort is normal.  ?Abdominal:  ?   Palpations: Abdomen is soft.  ?Skin: ?   General: Skin is warm.  ?Neurological:  ?   Mental Status: He is alert and oriented to person, place, and time.  ?Psychiatric:     ?   Behavior: Behavior normal.     ?   Thought Content: Thought content normal.     ?   Judgment: Judgment normal.  ? ? ?Ortho Exam ? ?Examination of the left shoulder shows a prominent distal clavicle from prior AC separation.  No skin compromise.   Passive and active range of motion of the shoulder is well-tolerated.  Cuff strength is grossly intact to manual muscle testing. ? ?Specialty Comments:  ?No specialty comments available. ? ?Imaging: ?XR Shoulder Left ? ?Result Date: 04/26/2022 ?Chronic AC separation.  No acute abnormalities.  ? ? ?PMFS History: ?Patient Active Problem List  ? Diagnosis Date Noted  ? Unilateral primary osteoarthritis, right hip 03/30/2022  ? Prostatic hypertrophy 11/20/2020  ? Internal carotid artery stenosis 02/05/2020  ? Essential hypertension 02/05/2020  ? Benign prostatic hyperplasia with lower urinary tract symptoms 02/05/2020  ? GERD (gastroesophageal reflux disease) 02/05/2020  ? Chronic back pain 02/05/2020  ? Vision loss of right eye 02/04/2020  ? Wound drainage 08/20/2018  ? Chronic pain of both shoulders 02/22/2017  ? S/P lumbar spinal fusion 04/15/2015  ? HNP (herniated nucleus pulposus), lumbar 10/26/2012  ?  Class: Diagnosis of  ? ?Past Medical History:  ?Diagnosis Date  ? Arthritis   ? BPH (benign prostatic hypertrophy)   ? Carotid stenosis sx done feb 2021  ? right   ? 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 ([place])   ? Hx: of after 2013 back surgery  ? GERD (gastroesophageal reflux disease)   ?  Hearing aid worn   ? Hx: of right ear  ? Hyperlipemia   ? Hypertension   ? Lumbar herniated disc   ? Lumbar stenosis   ? Macular degeneration right eye dry  ? PONV (postoperative nausea and vomiting)   ? takes iv nausea meds   ? Seasonal allergies   ? Hx: of  ? Stroke [facility])   ?  ?Family History  ?Problem Relation Age of Onset  ? Stroke Mother   ? Cancer - Prostate Father   ?  ?Past Surgical History:  ?Procedure Laterality Date  ? BACK SURGERY  2013, x 3 with dr Ronnald Ramp 2014, 2016 and 2019  ? x 4, takes back injections monthly  ? COLONOSCOPY    ? Hx: of  ? ENDARTERECTOMY Right 02/07/2020  ? Procedure: ENDARTERECTOMY CAROTID;  Surgeon: Serafina Mitchell, MD;  Location: [facility] OR;  Service: Vascular;  Laterality: Right;  ? EYE SURGERY     ? bil catarcts 02/2016  ? HERNIA REPAIR  several yrs ago  ? inguinal  ? LUMBAR LAMINECTOMY  10/26/2012  ? Procedure: MICRODISCECTOMY LUMBAR LAMINECTOMY;  Surgeon: Marybelle Killings, MD;  Location: [place];  Service: Orthopedics;  Laterality: Right;  Right L4-5 Microdiscectomy  ? LUMBAR WOUND DEBRIDEMENT N/A 09/15/2021  ? Procedure: revision of thoracic incision, Irrigation and debridement of battery pocket wound;  Surgeon: Eustace Moore, MD;  Location: [place];  Service: Neurosurgery;  Laterality: N/A;  ? MAXIMUM ACCESS (MAS)POSTERIOR LUMBAR INTERBODY FUSION (PLIF) 1 LEVEL N/A 04/15/2015  ? Procedure: Maximum Access Surgery Posterior Lumbar Interbody Fusion Lumbar two-Lumbar three extension of hardware;  Surgeon: Eustace Moore, MD;  Location: [place] [facility];  Service: Neurosurgery;  Laterality: N/A;  ? PATCH ANGIOPLASTY Right 02/07/2020  ? Procedure: PATCH ANGIOPLASTY USING Rueben Bash BIOLOGIC PATCH;  Surgeon: Serafina Mitchell, MD;  Location: [facility] OR;  Service: Vascular;  Laterality: Right;  ? ROTATOR CUFF REPAIR    ? right shoulder - 3 times  ? SPINAL CORD STIMULATOR INSERTION  08/11/2021  ? TONSILLECTOMY  as child  ? TRANSURETHRAL RESECTION OF BLADDER TUMOR N/A 02/24/2017  ? Procedure: TRANSURETHRAL RESECTION OF BLADDER TUMOR (TURBT);  Surgeon: Alexis Frock, MD;  Location: [facility];  Service: Urology;  Laterality: N/A;  ? TRANSURETHRAL RESECTION OF PROSTATE N/A 11/20/2020  ? Procedure: TRANSURETHRAL RESECTION OF THE PROSTATE (TURP);  Surgeon: Alexis Frock, MD;  Location: [facility];  Service: Urology;  Laterality: N/A;  ? ?Social History  ? ?Occupational History  ? Not on file  ?Tobacco Use  ? Smoking status: Former  ?  Packs/day: 3.00  ?  Years: 5.00  ?  Pack years: 15.00  ?  Types: Cigarettes  ?  Quit date: 11/12/1963  ?  Years since quitting: 58.4  ? Smokeless tobacco: Never  ?Vaping Use  ? Vaping Use: Never used  ?Substance and Sexual Activity  ? Alcohol use: No  ? Drug use: No  ? Sexual activity: Yes   ? ? ? ? ? ? ?

## 2022-05-04 ENCOUNTER — Encounter: Payer: Self-pay | Admitting: Physical Medicine and Rehabilitation

## 2022-05-04 ENCOUNTER — Ambulatory Visit (INDEPENDENT_AMBULATORY_CARE_PROVIDER_SITE_OTHER): Payer: Medicare Other | Admitting: Physical Medicine and Rehabilitation

## 2022-05-04 ENCOUNTER — Ambulatory Visit: Payer: Self-pay

## 2022-05-04 DIAGNOSIS — M25551 Pain in right hip: Secondary | ICD-10-CM

## 2022-05-04 NOTE — Progress Notes (Signed)
Pt state right hip pain. Pt state any movement makes the pain worse. Pt state he uses pain patches to help ease his pain.  Numeric Pain Rating Scale and Functional Assessment Average Pain 2   In the last MONTH (on 0-10 scale) has pain interfered with the following?  1. General activity like being  able to carry out your everyday physical activities such as walking, climbing stairs, carrying groceries, or moving a chair?  Rating(9)   +Driver, -BT, -Dye Allergies.

## 2022-05-04 NOTE — Progress Notes (Signed)
   Jay Tran - 88 y.o. male MRN [id number]  Date of birth: 01-Aug-1934  Office Visit Note: Visit Date: 05/04/2022 PCP: Leonard Downing, MD Referred by: Leonard Downing, *  Subjective: Chief Complaint  Patient presents with   Right Hip - Pain   HPI:  Jay Tran is a 88 y.o. male who comes in today at the request of Dr. Jean Rosenthal for planned Right anesthetic hip arthrogram with fluoroscopic guidance.  The patient has failed conservative care including home exercise, medications, time and activity modification.  This injection will be diagnostic and hopefully therapeutic.  Please see requesting physician notes for further details and justification.  ROS Otherwise per HPI.  Assessment & Plan: Visit Diagnoses:    ICD-10-CM   1. Pain in right hip  M25.551 Large Joint Inj: R hip joint    XR C-ARM NO REPORT      Plan: No additional findings.   Meds & Orders: No orders of the defined types were placed in this encounter.   Orders Placed This Encounter  Procedures   Large Joint Inj: R hip joint   XR C-ARM NO REPORT    Follow-up: Return for visit to requesting provider as needed.   Procedures: Large Joint Inj: R hip joint on 05/04/2022 12:58 PM Indications: diagnostic evaluation and pain Details: 22 G 3.5 in needle, fluoroscopy-guided anterior approach  Arthrogram: No  Medications: 4 mL bupivacaine 0.25 %; 60 mg triamcinolone acetonide 40 MG/ML Outcome: tolerated well, no immediate complications  There was excellent flow of contrast producing a partial arthrogram of the hip. The patient did have relief of symptoms during the anesthetic phase of the injection. Procedure, treatment alternatives, risks and benefits explained, specific risks discussed. Consent was given by the patient. Immediately prior to procedure a time out was called to verify the correct patient, procedure, equipment, support staff and site/side marked as required. Patient was prepped and  draped in the usual sterile fashion.         Clinical History: No specialty comments available.     Objective:  VS:  HT:    WT:   BMI:     BP:   HR: bpm  TEMP: ( )  RESP:  Physical Exam   Imaging: No results found.

## 2022-05-18 ENCOUNTER — Telehealth: Payer: Self-pay | Admitting: Orthopaedic Surgery

## 2022-05-18 MED ORDER — BUPIVACAINE HCL 0.25 % IJ SOLN
4.0000 mL | INTRAMUSCULAR | Status: AC | PRN
Start: 2022-05-04 — End: 2022-05-04
  Administered 2022-05-04: 4 mL via INTRA_ARTICULAR

## 2022-05-18 MED ORDER — TRIAMCINOLONE ACETONIDE 40 MG/ML IJ SUSP
60.0000 mg | INTRAMUSCULAR | Status: AC | PRN
  Administered 2022-05-04: 60 mg via INTRA_ARTICULAR

## 2022-05-18 NOTE — Telephone Encounter (Signed)
Please advise 

## 2022-05-18 NOTE — Telephone Encounter (Signed)
Pt's wife called [facility] asking for a call back concerning pt's recent injection. Pt states still having problems. Please call pt at [phone].

## 2022-06-06 ENCOUNTER — Ambulatory Visit (INDEPENDENT_AMBULATORY_CARE_PROVIDER_SITE_OTHER): Payer: Medicare Other | Admitting: Orthopaedic Surgery

## 2022-06-06 ENCOUNTER — Encounter (INDEPENDENT_AMBULATORY_CARE_PROVIDER_SITE_OTHER): Payer: Medicare Other | Admitting: Ophthalmology

## 2022-06-06 DIAGNOSIS — H3411 Central retinal artery occlusion, right eye: Secondary | ICD-10-CM | POA: Diagnosis not present

## 2022-06-06 DIAGNOSIS — M1611 Unilateral primary osteoarthritis, right hip: Secondary | ICD-10-CM | POA: Diagnosis not present

## 2022-06-06 DIAGNOSIS — I6523 Occlusion and stenosis of bilateral carotid arteries: Secondary | ICD-10-CM

## 2022-06-06 DIAGNOSIS — H353221 Exudative age-related macular degeneration, left eye, with active choroidal neovascularization: Secondary | ICD-10-CM

## 2022-06-06 DIAGNOSIS — M25551 Pain in right hip: Secondary | ICD-10-CM | POA: Diagnosis not present

## 2022-06-06 DIAGNOSIS — H35033 Hypertensive retinopathy, bilateral: Secondary | ICD-10-CM | POA: Diagnosis not present

## 2022-06-06 DIAGNOSIS — I10 Essential (primary) hypertension: Secondary | ICD-10-CM | POA: Diagnosis not present

## 2022-06-06 DIAGNOSIS — H43813 Vitreous degeneration, bilateral: Secondary | ICD-10-CM

## 2022-06-06 DIAGNOSIS — H47211 Primary optic atrophy, right eye: Secondary | ICD-10-CM

## 2022-06-06 NOTE — Progress Notes (Signed)
The patient has known osteoarthritis of his right hip.  He is 88 years old.  He is also dealing with severe macular degeneration of his left eye.  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  He did have an intra-articular injection a month ago of a steroid in that right hip and that helped him for about 2 weeks but is still he is off his pain.  His right hip moves smoothly and fluidly with only some mild pain.  Had a long and thorough discussion about trying a cane in his opposite hand for at least 6 weeks or more.  I would have him hold off on a steroid injection again until late August or early September.  He will call if he decides to have that done versus a hip replacement.  All question concerns were answered and addressed.

## 2022-06-23 ENCOUNTER — Encounter (INDEPENDENT_AMBULATORY_CARE_PROVIDER_SITE_OTHER): Payer: Medicare Other | Admitting: Ophthalmology

## 2022-06-23 DIAGNOSIS — H3411 Central retinal artery occlusion, right eye: Secondary | ICD-10-CM

## 2022-06-23 DIAGNOSIS — H43813 Vitreous degeneration, bilateral: Secondary | ICD-10-CM

## 2022-06-23 DIAGNOSIS — I10 Essential (primary) hypertension: Secondary | ICD-10-CM

## 2022-06-23 DIAGNOSIS — H35033 Hypertensive retinopathy, bilateral: Secondary | ICD-10-CM | POA: Diagnosis not present

## 2022-06-23 DIAGNOSIS — H353231 Exudative age-related macular degeneration, bilateral, with active choroidal neovascularization: Secondary | ICD-10-CM | POA: Diagnosis not present

## 2022-06-28 ENCOUNTER — Encounter (INDEPENDENT_AMBULATORY_CARE_PROVIDER_SITE_OTHER): Payer: Medicare Other | Admitting: Ophthalmology

## 2022-06-28 DIAGNOSIS — H353231 Exudative age-related macular degeneration, bilateral, with active choroidal neovascularization: Secondary | ICD-10-CM

## 2022-06-28 DIAGNOSIS — H3411 Central retinal artery occlusion, right eye: Secondary | ICD-10-CM | POA: Diagnosis not present

## 2022-06-28 DIAGNOSIS — I10 Essential (primary) hypertension: Secondary | ICD-10-CM | POA: Diagnosis not present

## 2022-06-28 DIAGNOSIS — H43813 Vitreous degeneration, bilateral: Secondary | ICD-10-CM

## 2022-06-28 DIAGNOSIS — H35033 Hypertensive retinopathy, bilateral: Secondary | ICD-10-CM | POA: Diagnosis not present

## 2022-06-28 DIAGNOSIS — H47211 Primary optic atrophy, right eye: Secondary | ICD-10-CM

## 2022-07-04 ENCOUNTER — Encounter (INDEPENDENT_AMBULATORY_CARE_PROVIDER_SITE_OTHER): Payer: Medicare Other | Admitting: Ophthalmology

## 2022-07-04 DIAGNOSIS — H35033 Hypertensive retinopathy, bilateral: Secondary | ICD-10-CM

## 2022-07-04 DIAGNOSIS — I10 Essential (primary) hypertension: Secondary | ICD-10-CM

## 2022-07-04 DIAGNOSIS — H353231 Exudative age-related macular degeneration, bilateral, with active choroidal neovascularization: Secondary | ICD-10-CM | POA: Diagnosis not present

## 2022-07-04 DIAGNOSIS — H43813 Vitreous degeneration, bilateral: Secondary | ICD-10-CM

## 2022-07-04 DIAGNOSIS — H3411 Central retinal artery occlusion, right eye: Secondary | ICD-10-CM | POA: Diagnosis not present

## 2022-07-14 ENCOUNTER — Ambulatory Visit (INDEPENDENT_AMBULATORY_CARE_PROVIDER_SITE_OTHER): Payer: Medicare Other | Admitting: Orthopaedic Surgery

## 2022-07-14 ENCOUNTER — Encounter: Payer: Self-pay | Admitting: Orthopaedic Surgery

## 2022-07-14 DIAGNOSIS — M25511 Pain in right shoulder: Secondary | ICD-10-CM

## 2022-07-14 DIAGNOSIS — M25512 Pain in left shoulder: Secondary | ICD-10-CM | POA: Diagnosis not present

## 2022-07-14 DIAGNOSIS — I6523 Occlusion and stenosis of bilateral carotid arteries: Secondary | ICD-10-CM

## 2022-07-14 DIAGNOSIS — G8929 Other chronic pain: Secondary | ICD-10-CM | POA: Diagnosis not present

## 2022-07-14 MED ORDER — METHYLPREDNISOLONE ACETATE 40 MG/ML IJ SUSP
40.0000 mg | INTRAMUSCULAR | Status: AC | PRN
  Administered 2022-07-14: 40 mg via INTRA_ARTICULAR

## 2022-07-14 MED ORDER — LIDOCAINE HCL 1 % IJ SOLN
3.0000 mL | INTRAMUSCULAR | Status: AC | PRN
  Administered 2022-07-14: 3 mL

## 2022-07-14 MED ORDER — LIDOCAINE HCL 1 % IJ SOLN
3.0000 mL | INTRAMUSCULAR | Status: AC | PRN
Start: 2022-07-14 — End: 2022-07-14
  Administered 2022-07-14: 3 mL

## 2022-07-14 NOTE — Progress Notes (Signed)
Office Visit Note   Patient: Jay Tran           Date of Birth: 1934/03/26           MRN: [id number] Visit Date: 07/14/2022              Requested by: Leonard Downing, MD [address] [place],  [place] [address] PCP: Leonard Downing, MD   Assessment & Plan: Visit Diagnoses:  1. Chronic left shoulder pain   2. Chronic right shoulder pain     Plan: Given the patient's age I do feel it appropriate to try a steroid injection of both shoulder subacromial outlets and he agreed to this and tolerated them well.  All question concerns were answered and addressed.  Follow-up can be as needed.  If things worsen they will come see [place].  Follow-Up Instructions: Return if symptoms worsen or fail to improve.   Orders:  Orders Placed This Encounter  Procedures   Large Joint Inj   Large Joint Inj   No orders of the defined types were placed in this encounter.     Procedures: Large Joint Inj: R subacromial bursa on 07/14/2022 3:43 PM Indications: pain and diagnostic evaluation Details: 22 G 1.5 in needle  Arthrogram: No  Medications: 3 mL lidocaine 1 %; 40 mg methylPREDNISolone acetate 40 MG/ML Outcome: tolerated well, no immediate complications Procedure, treatment alternatives, risks and benefits explained, specific risks discussed. Consent was given by the patient. Immediately prior to procedure a time out was called to verify the correct patient, procedure, equipment, support staff and site/side marked as required. Patient was prepped and draped in the usual sterile fashion.    Large Joint Inj: L subacromial bursa on 07/14/2022 3:43 PM Indications: pain and diagnostic evaluation Details: 22 G 1.5 in needle  Arthrogram: No  Medications: 3 mL lidocaine 1 %; 40 mg methylPREDNISolone acetate 40 MG/ML Outcome: tolerated well, no immediate complications Procedure, treatment alternatives, risks and benefits explained, specific risks discussed. Consent was given by the  patient. Immediately prior to procedure a time out was called to verify the correct patient, procedure, equipment, support staff and site/side marked as required. Patient was prepped and draped in the usual sterile fashion.       Clinical Data: No additional findings.   Subjective: Chief Complaint  Patient presents with   Left Shoulder - Pain   Right Shoulder - Pain  The patient is an 88 year old gentleman well-known to me.  He does have severe arthritis in his right hip but is really not bothering him right now.  He has been doing some chronic left and right shoulder pain.  He has a very old AC joint separation on the left side and that has not given him any problems until he had a fall in early May of this year.  He did see one of my partners and new x-rays showed no acute findings.  He says he has pain in the left shoulder does wake him up at night and some right shoulder pain.  He has a remote history of an open rotator cuff repair on the right side.  He is not a diabetic.  He is requesting steroid injections today.  HPI  Review of Systems There is currently listed no fever, chills, nausea, vomiting  Objective: Vital Signs: There were no vitals taken for this visit.  Physical Exam He is alert and orient x3 and in no acute distress Ortho Exam Examination of both shoulder  shows the [facility] separation on the left side and both shoulders move well but have painful arc of motion and are globally tender.  There is no evidence of infection. Specialty Comments:  No specialty comments available.  Imaging: No results found.   PMFS History: Patient Active Problem List   Diagnosis Date Noted   Unilateral primary osteoarthritis, right hip 03/30/2022   Prostatic hypertrophy 11/20/2020   Internal carotid artery stenosis 02/05/2020   Essential hypertension 02/05/2020   Benign prostatic hyperplasia with lower urinary tract symptoms 02/05/2020   GERD (gastroesophageal reflux disease)  02/05/2020   Chronic back pain 02/05/2020   Vision loss of right eye 02/04/2020   Wound drainage 08/20/2018   Chronic pain of both shoulders 02/22/2017   S/P lumbar spinal fusion 04/15/2015   HNP (herniated nucleus pulposus), lumbar 10/26/2012    Class: Diagnosis of   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 ([place])    Hx: of after 2013 back surgery   GERD (gastroesophageal reflux disease)    Hearing aid worn    Hx: of right ear   Hyperlipemia    Hypertension    Lumbar herniated disc    Lumbar stenosis    Macular degeneration right eye dry   PONV (postoperative nausea and vomiting)    takes iv nausea meds    Seasonal allergies    Hx: of   Stroke [facility])     Family History  Problem Relation Age of Onset   Stroke Mother    Cancer - Prostate Father     Past Surgical History:  Procedure Laterality Date   BACK SURGERY  2013, x 3 with dr Ronnald Ramp 2014, 2016 and 2019   x 4, takes back injections monthly   COLONOSCOPY     Hx: of   ENDARTERECTOMY Right 02/07/2020   Procedure: ENDARTERECTOMY CAROTID;  Surgeon: Serafina Mitchell, MD;  Location: [place];  Service: Vascular;  Laterality: Right;   EYE SURGERY     bil catarcts 02/2016   HERNIA REPAIR  several yrs ago   inguinal   LUMBAR LAMINECTOMY  10/26/2012   Procedure: MICRODISCECTOMY LUMBAR LAMINECTOMY;  Surgeon: Marybelle Killings, MD;  Location: [place];  Service: Orthopedics;  Laterality: Right;  Right L4-5 Microdiscectomy   LUMBAR WOUND DEBRIDEMENT N/A 09/15/2021   Procedure: revision of thoracic incision, Irrigation and debridement of battery pocket wound;  Surgeon: Eustace Moore, MD;  Location: [place];  Service: Neurosurgery;  Laterality: N/A;   MAXIMUM ACCESS (MAS)POSTERIOR LUMBAR INTERBODY FUSION (PLIF) 1 LEVEL N/A 04/15/2015   Procedure: Maximum Access Surgery Posterior Lumbar Interbody Fusion  Lumbar two-Lumbar three extension of hardware;  Surgeon: Eustace Moore, MD;  Location: [place] [facility];  Service: Neurosurgery;  Laterality: N/A;   PATCH ANGIOPLASTY Right 02/07/2020   Procedure: PATCH ANGIOPLASTY USING Rueben Bash BIOLOGIC PATCH;  Surgeon: Serafina Mitchell, MD;  Location: [facility];  Service: Vascular;  Laterality: Right;   ROTATOR CUFF REPAIR     right shoulder - 3 times   SPINAL CORD STIMULATOR INSERTION  08/11/2021   TONSILLECTOMY  as child   TRANSURETHRAL RESECTION OF BLADDER TUMOR N/A 02/24/2017   Procedure: TRANSURETHRAL RESECTION OF BLADDER TUMOR (TURBT);  Surgeon: Alexis Frock, MD;  Location: [facility];  Service: Urology;  Laterality: N/A;   TRANSURETHRAL RESECTION OF PROSTATE N/A 11/20/2020  Procedure: TRANSURETHRAL RESECTION OF THE PROSTATE (TURP);  Surgeon: Alexis Frock, MD;  Location: [facility];  Service: Urology;  Laterality: N/A;   Social History   Occupational History   Not on file  Tobacco Use   Smoking status: Former    Packs/day: 3.00    Years: 5.00    Total pack years: 15.00    Types: Cigarettes    Quit date: 11/12/1963    Years since quitting: 58.7   Smokeless tobacco: Never  Vaping Use   Vaping Use: Never used  Substance and Sexual Activity   Alcohol use: No   Drug use: No   Sexual activity: Yes

## 2022-07-27 ENCOUNTER — Encounter (INDEPENDENT_AMBULATORY_CARE_PROVIDER_SITE_OTHER): Payer: Medicare Other | Admitting: Ophthalmology

## 2022-07-27 DIAGNOSIS — H3411 Central retinal artery occlusion, right eye: Secondary | ICD-10-CM | POA: Diagnosis not present

## 2022-07-27 DIAGNOSIS — H35033 Hypertensive retinopathy, bilateral: Secondary | ICD-10-CM

## 2022-07-27 DIAGNOSIS — H353231 Exudative age-related macular degeneration, bilateral, with active choroidal neovascularization: Secondary | ICD-10-CM

## 2022-07-27 DIAGNOSIS — H43813 Vitreous degeneration, bilateral: Secondary | ICD-10-CM

## 2022-07-27 DIAGNOSIS — I10 Essential (primary) hypertension: Secondary | ICD-10-CM

## 2022-08-01 ENCOUNTER — Encounter (INDEPENDENT_AMBULATORY_CARE_PROVIDER_SITE_OTHER): Payer: Medicare Other | Admitting: Ophthalmology

## 2022-08-31 ENCOUNTER — Encounter (INDEPENDENT_AMBULATORY_CARE_PROVIDER_SITE_OTHER): Payer: Medicare Other | Admitting: Ophthalmology

## 2022-08-31 DIAGNOSIS — H35033 Hypertensive retinopathy, bilateral: Secondary | ICD-10-CM | POA: Diagnosis not present

## 2022-08-31 DIAGNOSIS — H43813 Vitreous degeneration, bilateral: Secondary | ICD-10-CM

## 2022-08-31 DIAGNOSIS — H353231 Exudative age-related macular degeneration, bilateral, with active choroidal neovascularization: Secondary | ICD-10-CM

## 2022-08-31 DIAGNOSIS — I10 Essential (primary) hypertension: Secondary | ICD-10-CM | POA: Diagnosis not present

## 2022-08-31 DIAGNOSIS — H3411 Central retinal artery occlusion, right eye: Secondary | ICD-10-CM

## 2022-08-31 DIAGNOSIS — H47212 Primary optic atrophy, left eye: Secondary | ICD-10-CM

## 2022-09-15 ENCOUNTER — Ambulatory Visit (INDEPENDENT_AMBULATORY_CARE_PROVIDER_SITE_OTHER): Payer: Medicare Other | Admitting: Orthopaedic Surgery

## 2022-09-15 ENCOUNTER — Encounter: Payer: Self-pay | Admitting: Orthopaedic Surgery

## 2022-09-15 DIAGNOSIS — M25511 Pain in right shoulder: Secondary | ICD-10-CM | POA: Diagnosis not present

## 2022-09-15 DIAGNOSIS — G8929 Other chronic pain: Secondary | ICD-10-CM

## 2022-09-15 DIAGNOSIS — M25512 Pain in left shoulder: Secondary | ICD-10-CM | POA: Diagnosis not present

## 2022-09-15 DIAGNOSIS — I6523 Occlusion and stenosis of bilateral carotid arteries: Secondary | ICD-10-CM | POA: Diagnosis not present

## 2022-09-15 NOTE — Addendum Note (Signed)
Addended byLaurann Montana on: 09/15/2022 01:56 PM   Modules accepted: Orders

## 2022-09-15 NOTE — Progress Notes (Signed)
The patient is an 88 year old gentleman who continues to have significant left shoulder pain after 2 mechanical falls now.  He has a remote history of an AC separation that occurred many years ago.  We have seen him at least twice and provide a steroid injection of the subacromial outlet.  He says is getting worse with pain with overhead activities.  The steroid injection has not really helped him at all at this point.  Previous x-rays did not show any new acute injury.  On exam he has significant pain and weakness with reaching overhead and external rotation on the left side.  At this point given the very conservative treatment, a MRI of the left shoulder is warranted given the significant weakness and pain he continues to have with his left shoulder.  This will help guide further treatment plan.  He and his wife agree with that recommendation and we will be in touch for scheduling the MRI and then getting him back to go over it.

## 2022-10-04 ENCOUNTER — Encounter (INDEPENDENT_AMBULATORY_CARE_PROVIDER_SITE_OTHER): Payer: Medicare Other | Admitting: Ophthalmology

## 2022-10-04 DIAGNOSIS — H3411 Central retinal artery occlusion, right eye: Secondary | ICD-10-CM

## 2022-10-04 DIAGNOSIS — H353231 Exudative age-related macular degeneration, bilateral, with active choroidal neovascularization: Secondary | ICD-10-CM

## 2022-10-04 DIAGNOSIS — I10 Essential (primary) hypertension: Secondary | ICD-10-CM | POA: Diagnosis not present

## 2022-10-04 DIAGNOSIS — H35033 Hypertensive retinopathy, bilateral: Secondary | ICD-10-CM | POA: Diagnosis not present

## 2022-10-04 DIAGNOSIS — H43813 Vitreous degeneration, bilateral: Secondary | ICD-10-CM

## 2022-10-20 ENCOUNTER — Ambulatory Visit: Payer: Self-pay

## 2022-10-20 ENCOUNTER — Other Ambulatory Visit: Payer: Self-pay

## 2022-10-20 DIAGNOSIS — Z9689 Presence of other specified functional implants: Secondary | ICD-10-CM

## 2022-10-26 ENCOUNTER — Encounter (INDEPENDENT_AMBULATORY_CARE_PROVIDER_SITE_OTHER): Payer: Medicare Other | Admitting: Ophthalmology

## 2022-10-26 DIAGNOSIS — H43813 Vitreous degeneration, bilateral: Secondary | ICD-10-CM

## 2022-10-26 DIAGNOSIS — H353231 Exudative age-related macular degeneration, bilateral, with active choroidal neovascularization: Secondary | ICD-10-CM

## 2022-10-26 DIAGNOSIS — I10 Essential (primary) hypertension: Secondary | ICD-10-CM

## 2022-10-26 DIAGNOSIS — H3411 Central retinal artery occlusion, right eye: Secondary | ICD-10-CM | POA: Diagnosis not present

## 2022-10-26 DIAGNOSIS — H35033 Hypertensive retinopathy, bilateral: Secondary | ICD-10-CM

## 2022-10-27 ENCOUNTER — Ambulatory Visit (HOSPITAL_BASED_OUTPATIENT_CLINIC_OR_DEPARTMENT_OTHER): Payer: Medicare Other

## 2022-10-27 ENCOUNTER — Other Ambulatory Visit (HOSPITAL_BASED_OUTPATIENT_CLINIC_OR_DEPARTMENT_OTHER): Payer: Self-pay | Admitting: Orthopaedic Surgery

## 2022-10-27 ENCOUNTER — Ambulatory Visit (INDEPENDENT_AMBULATORY_CARE_PROVIDER_SITE_OTHER): Payer: Medicare Other

## 2022-10-27 DIAGNOSIS — Z9689 Presence of other specified functional implants: Secondary | ICD-10-CM

## 2022-11-02 ENCOUNTER — Ambulatory Visit (HOSPITAL_COMMUNITY)
Admission: RE | Admit: 2022-11-02 | Discharge: 2022-11-02 | Disposition: A | Discharge status: Home / Self Care | Payer: Medicare Other | Source: Ambulatory Visit | Attending: Orthopaedic Surgery | Admitting: Orthopaedic Surgery

## 2022-11-02 DIAGNOSIS — G8929 Other chronic pain: Secondary | ICD-10-CM

## 2022-11-18 ENCOUNTER — Ambulatory Visit (HOSPITAL_COMMUNITY)
Admission: RE | Admit: 2022-11-18 | Discharge: 2022-11-18 | Disposition: A | Discharge status: Home / Self Care | Payer: Medicare Other | Source: Ambulatory Visit | Attending: Orthopaedic Surgery | Admitting: Orthopaedic Surgery

## 2022-11-18 DIAGNOSIS — M25512 Pain in left shoulder: Secondary | ICD-10-CM | POA: Diagnosis present

## 2022-11-18 DIAGNOSIS — G8929 Other chronic pain: Secondary | ICD-10-CM | POA: Diagnosis present

## 2022-11-21 ENCOUNTER — Telehealth: Payer: Self-pay | Admitting: Orthopaedic Surgery

## 2022-11-21 NOTE — Telephone Encounter (Signed)
Pt wife called requesting someone call her with her husbands mri results, they can not get into the office until 12/18

## 2022-11-22 NOTE — Telephone Encounter (Signed)
Scheduled 12/13

## 2022-11-30 ENCOUNTER — Ambulatory Visit (INDEPENDENT_AMBULATORY_CARE_PROVIDER_SITE_OTHER): Payer: Medicare Other | Admitting: Orthopaedic Surgery

## 2022-11-30 DIAGNOSIS — M25511 Pain in right shoulder: Secondary | ICD-10-CM | POA: Diagnosis not present

## 2022-11-30 DIAGNOSIS — G8929 Other chronic pain: Secondary | ICD-10-CM

## 2022-11-30 DIAGNOSIS — M25512 Pain in left shoulder: Secondary | ICD-10-CM

## 2022-11-30 NOTE — Progress Notes (Signed)
Chief Complaint: Bilateral shoulder pain     History of Present Illness:    Jay Tran is a 88 y.o. male presents today with right worse than left shoulder pain.  He has previously been treated and seen by Dr. Ninfa Linden.  He is status post right shoulder rotator cuff repair which subsequently developed osteoarthritis.  He did have an injection with Dr. Ninfa Linden which did help significantly on this right shoulder.  He is endorsing limited function and some pain about the left shoulder for which Dr. Ninfa Linden and recently ordered an MRI.  He is here today for MRI discussion.  Overall he is quite hesitant to have any type of surgical intervention at this time given his history of infection on the contralateral side following rotator cuff repair.  He is here today for further discussion.  Overall his left shoulder feels much better and improved today.  He does have limited overhead motion although the pain has subsided quite significantly.    Surgical History:   As above  PMH/PSH/Family History/Social History/Meds/Allergies:    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 (HCC)    Hx: of after 2013 back surgery   GERD (gastroesophageal reflux disease)    Hearing aid worn    Hx: of right ear   Hyperlipemia    Hypertension    Lumbar herniated disc    Lumbar stenosis    Macular degeneration right eye dry   PONV (postoperative nausea and vomiting)    takes iv nausea meds    Seasonal allergies    Hx: of   Stroke [facility])    Past Surgical History:  Procedure Laterality Date   BACK SURGERY  2013, x 3 with dr Ronnald Ramp 2014, 2016 and 2019   x 4, takes back injections monthly   COLONOSCOPY     Hx: of   ENDARTERECTOMY Right 02/07/2020   Procedure: ENDARTERECTOMY CAROTID;  Surgeon: Serafina Mitchell, MD;  Location: [place];   Service: Vascular;  Laterality: Right;   EYE SURGERY     bil catarcts 02/2016   HERNIA REPAIR  several yrs ago   inguinal   LUMBAR LAMINECTOMY  10/26/2012   Procedure: MICRODISCECTOMY LUMBAR LAMINECTOMY;  Surgeon: Marybelle Killings, MD;  Location: [place];  Service: Orthopedics;  Laterality: Right;  Right L4-5 Microdiscectomy   LUMBAR WOUND DEBRIDEMENT N/A 09/15/2021   Procedure: revision of thoracic incision, Irrigation and debridement of battery pocket wound;  Surgeon: Eustace Moore, MD;  Location: [place];  Service: Neurosurgery;  Laterality: N/A;   MAXIMUM ACCESS (MAS)POSTERIOR LUMBAR INTERBODY FUSION (PLIF) 1 LEVEL N/A 04/15/2015   Procedure: Maximum Access Surgery Posterior Lumbar Interbody Fusion Lumbar two-Lumbar three extension of hardware;  Surgeon: Eustace Moore, MD;  Location: [place] [facility];  Service: Neurosurgery;  Laterality: N/A;   PATCH ANGIOPLASTY Right 02/07/2020   Procedure: PATCH ANGIOPLASTY USING Rueben Bash BIOLOGIC PATCH;  Surgeon: Serafina Mitchell, MD;  [facility];  Service: Vascular;  Laterality: Right;   ROTATOR CUFF REPAIR     right shoulder - 3 times   SPINAL CORD STIMULATOR INSERTION  08/11/2021   TONSILLECTOMY  as child  TRANSURETHRAL RESECTION OF BLADDER TUMOR N/A 02/24/2017   Procedure: TRANSURETHRAL RESECTION OF BLADDER TUMOR (TURBT);  Surgeon: Alexis Frock, MD;  Location: [facility];  Service: Urology;  Laterality: N/A;   TRANSURETHRAL RESECTION OF PROSTATE N/A 11/20/2020   Procedure: TRANSURETHRAL RESECTION OF THE PROSTATE (TURP);  Surgeon: Alexis Frock, MD;  Location: [facility];  Service: Urology;  Laterality: N/A;   Social History   Socioeconomic History   Marital status: Married    Spouse name: [name]   Number of children: Not on file   Years of education: Not on file   Highest education level: Not on file  Occupational History   Not on file  Tobacco Use   Smoking status: Former    Packs/day: 3.00    Years: 5.00    Total pack years:  15.00    Types: Cigarettes    Quit date: 11/12/1963    Years since quitting: 59.0   Smokeless tobacco: Never  Vaping Use   Vaping Use: Never used  Substance and Sexual Activity   Alcohol use: No   Drug use: No   Sexual activity: Yes  Other Topics Concern   Not on file  Social History Narrative   Lives with wife   Social Determinants of Health   Financial Resource Strain: Not on file  Food Insecurity: Not on file  Transportation Needs: Not on file  Physical Activity: Not on file  Stress: Not on file  Social Connections: Not on file   Family History  Problem Relation Age of Onset   Stroke Mother    Cancer - Prostate Father    Allergies  Allergen Reactions   Codeine Itching and Nausea And Vomiting   Other Nausea Only    Anesthesia   Tramadol Nausea Only   Current Outpatient Medications  Medication Sig Dispense Refill   acetaminophen (TYLENOL) 500 MG tablet Take 500-1,000 mg by mouth every 6 (six) hours as needed for mild pain or headache (ONLY WHEN NOT TAKING ALEVE).     aspirin EC 81 MG tablet Take 81 mg by mouth in the morning. Swallow whole.     atorvastatin (LIPITOR) 40 MG tablet TAKE 1 TABLET BY MOUTH ONCE DAILY AT  6  PM (Patient taking differently: Take 40 mg by mouth at bedtime.) 90 tablet 3   diphenhydrAMINE (BENADRYL) 25 MG tablet Take 25 mg by mouth in the morning and at bedtime.     finasteride (PROSCAR) 5 MG tablet Take 5 mg by mouth at bedtime.      hydrochlorothiazide (HYDRODIURIL) 50 MG tablet Take 50 mg by mouth at bedtime.      Liniments (SALONPAS PAIN RELIEF PATCH EX) Place 1 patch onto the skin daily as needed (to affected area for pain).      lisinopril (PRINIVIL,ZESTRIL) 20 MG tablet Take 20 mg by mouth at bedtime.      Multiple Vitamins-Minerals (PRESERVISION AREDS 2 PO) Take 1 capsule by mouth 2 (two) times daily.      naproxen sodium (ALEVE) 220 MG tablet Take 220-440 mg by mouth 2 (two) times daily as needed (pain.).     Omega-3 Fatty Acids  (FISH OIL) 1200 MG CAPS Take 1,200 mg by mouth daily with breakfast.      ondansetron (ZOFRAN) 8 MG tablet Take 8 mg by mouth every 8 (eight) hours as needed for nausea or vomiting.     SYSTANE 0.4-0.3 % SOLN Place 1 drop into both eyes 3 (three) times daily as needed (for dryness).  Tamsulosin HCl (FLOMAX) 0.4 MG CAPS Take 0.4 mg by mouth at bedtime.      tiZANidine (ZANAFLEX) 4 MG tablet Take 1 tablet (4 mg total) by mouth every 8 (eight) hours as needed for muscle spasms. (Patient taking differently: Take 4 mg by mouth at bedtime.) 60 tablet 1   traMADol (ULTRAM) 50 MG tablet Take 1-2 tablets (50-100 mg total) by mouth every 6 (six) hours as needed. 30 tablet 0   Turmeric (QC TUMERIC COMPLEX PO) Take 500 mg by mouth daily with breakfast.     No current facility-administered medications for this visit.   No results found.  Review of Systems:   A ROS was performed including pertinent positives and negatives as documented in the HPI.  Physical Exam :   Constitutional: NAD and appears stated age Neurological: Alert and oriented Psych: Appropriate affect and cooperative There were no vitals taken for this visit.   Comprehensive Musculoskeletal Exam:    Tenderness to palpation about lateral shoulder on the left.  Forward elevation is to approximately 80 degrees external rotation at the side to 20 degrees.  Internal rotation is to front pocket.  On the right he has forward elevation of 125 degrees with external rotation at the side of 20 degrees.  Internal rotation is to L5.  He has 4 out of 5 strength bilaterally although worse on the left compared to the right  Imaging:   Xray (3 views left shoulder, 3 views right shoulder): Status post right shoulder rotator cuff repair with anchors in place and subsequent rotator cuff arthropathy following, left shoulder with superior migration of the humerus consistent with early rotator cuff arthropathy findings  MRI (left shoulder): There is  full-thickness rotator cuff tear with retraction past the glenoid and significant atrophy of the supraspinatus and infraspinatus tendons  I personally reviewed and interpreted the radiographs.   Assessment:   88 y.o. male right-hand-dominant male with left shoulder rotator cuff arthropathy in the setting of a massive rotator cuff tear with retraction.  Given this I discussed his treatment options.  Overall his left shoulder pain is much improved at today's visit and is actually quite minimal.  His overhead motion is limited although I would like to enroll him in physical therapy to hopefully optimize the shoulder.  We did discuss treatment options.  Given his history of significant infection following a rotator cuff repair given the fact that I do not necessarily believe that his rotator cuff is repairable I do believe he may be a candidate for balloon arthroplasty with the Stryker in space balloon.  We did discuss that this would be a surgical option should he ultimately failed conservative management with physical therapy.  Plan :    -We will plan to begin physical therapy, I have offered him injections as needed as well should he wish to pursue this.  He will attempt 2 months of physical therapy and they will reach out if no significant improvement on the left     I personally saw and evaluated the patient, and participated in the management and treatment plan.  Vanetta Mulders, MD Attending Physician, Orthopedic Surgery  This document was dictated using Dragon voice recognition software. A reasonable attempt at proof reading has been made to minimize errors.

## 2022-12-05 ENCOUNTER — Ambulatory Visit: Payer: Medicare Other | Admitting: Physician Assistant

## 2022-12-07 ENCOUNTER — Encounter: Payer: Self-pay | Admitting: Physical Therapy

## 2022-12-07 ENCOUNTER — Ambulatory Visit (INDEPENDENT_AMBULATORY_CARE_PROVIDER_SITE_OTHER): Payer: Medicare Other | Admitting: Physical Therapy

## 2022-12-07 ENCOUNTER — Other Ambulatory Visit: Payer: Self-pay

## 2022-12-07 DIAGNOSIS — M6281 Muscle weakness (generalized): Secondary | ICD-10-CM | POA: Diagnosis not present

## 2022-12-07 DIAGNOSIS — M25512 Pain in left shoulder: Secondary | ICD-10-CM

## 2022-12-07 DIAGNOSIS — M25612 Stiffness of left shoulder, not elsewhere classified: Secondary | ICD-10-CM

## 2022-12-07 DIAGNOSIS — G8929 Other chronic pain: Secondary | ICD-10-CM | POA: Diagnosis not present

## 2022-12-07 NOTE — Therapy (Addendum)
OUTPATIENT PHYSICAL THERAPY SHOULDER EVALUATION   Patient Name: Jay Tran MRN: [id number] DOB:[birth year], 88 y.o., male Today's Date: 12/07/2022  END OF SESSION:  PT End of Session - 12/07/22 1235     Visit Number 1    Number of Visits 16    Date for PT Re-Evaluation 02/01/23    Authorization Type Medicare    Progress Note Due on Visit 10    PT Start Time 1250    PT Stop Time 1335    PT Time Calculation (min) 45 min    Activity Tolerance Patient tolerated treatment well    Behavior During Therapy [facility] for tasks assessed/performed             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 (HCC)    Hx: of after 2013 back surgery   GERD (gastroesophageal reflux disease)    Hearing aid worn    Hx: of right ear   Hyperlipemia    Hypertension    Lumbar herniated disc    Lumbar stenosis    Macular degeneration right eye dry   PONV (postoperative nausea and vomiting)    takes iv nausea meds    Seasonal allergies    Hx: of   Stroke [facility])    Past Surgical History:  Procedure Laterality Date   BACK SURGERY  2013, x 3 with dr Ronnald Ramp 2014, 2016 and 2019   x 4, takes back injections monthly   COLONOSCOPY     Hx: of   ENDARTERECTOMY Right 02/07/2020   Procedure: ENDARTERECTOMY CAROTID;  Surgeon: Serafina Mitchell, MD;  Location: [place];  Service: Vascular;  Laterality: Right;   EYE SURGERY     bil catarcts 02/2016   HERNIA REPAIR  several yrs ago   inguinal   LUMBAR LAMINECTOMY  10/26/2012   Procedure: MICRODISCECTOMY LUMBAR LAMINECTOMY;  Surgeon: Marybelle Killings, MD;  Location: [place];  Service: Orthopedics;  Laterality: Right;  Right L4-5 Microdiscectomy   LUMBAR WOUND DEBRIDEMENT N/A 09/15/2021   Procedure: revision of thoracic incision, Irrigation and debridement of battery pocket wound;  Surgeon: Eustace Moore, MD;  Location: [place];   Service: Neurosurgery;  Laterality: N/A;   MAXIMUM ACCESS (MAS)POSTERIOR LUMBAR INTERBODY FUSION (PLIF) 1 LEVEL N/A 04/15/2015   Procedure: Maximum Access Surgery Posterior Lumbar Interbody Fusion Lumbar two-Lumbar three extension of hardware;  Surgeon: Eustace Moore, MD;  Location: [place] [facility];  Service: Neurosurgery;  Laterality: N/A;   PATCH ANGIOPLASTY Right 02/07/2020   Procedure: PATCH ANGIOPLASTY USING Rueben Bash BIOLOGIC PATCH;  Surgeon: Serafina Mitchell, MD;  [facility];  Service: Vascular;  Laterality: Right;   ROTATOR CUFF REPAIR     right shoulder - 3 times   SPINAL CORD STIMULATOR INSERTION  08/11/2021   TONSILLECTOMY  as child   TRANSURETHRAL RESECTION OF BLADDER TUMOR N/A 02/24/2017   Procedure: TRANSURETHRAL RESECTION OF BLADDER TUMOR (TURBT);  Surgeon: Alexis Frock, MD;  Location: [facility];  Service: Urology;  Laterality: N/A;   TRANSURETHRAL RESECTION OF PROSTATE N/A 11/20/2020   Procedure: TRANSURETHRAL RESECTION OF THE PROSTATE (TURP);  Surgeon: Alexis Frock, MD;  Location[facility];  Service: Urology;  Laterality: N/A;   Patient Active Problem List   Diagnosis Date Noted   Unilateral primary osteoarthritis, right hip 03/30/2022   Prostatic hypertrophy  11/20/2020   Internal carotid artery stenosis 02/05/2020   Essential hypertension 02/05/2020   Benign prostatic hyperplasia with lower urinary tract symptoms 02/05/2020   GERD (gastroesophageal reflux disease) 02/05/2020   Chronic back pain 02/05/2020   Vision loss of right eye 02/04/2020   Wound drainage 08/20/2018   Chronic pain of both shoulders 02/22/2017   S/P lumbar spinal fusion 04/15/2015   HNP (herniated nucleus pulposus), lumbar 10/26/2012    Class: Diagnosis of    PCP: Claris Gower  REFERRING PROVIDER: Vanetta Mulders, MD  REFERRING DIAG:  [phone] (ICD-10-CM) - Chronic right shoulder pain  M25.512,G89.29 (ICD-10-CM) - Chronic left shoulder pain    THERAPY DIAG:   Stiffness of left shoulder, not elsewhere classified  Chronic left shoulder pain  Muscle weakness (generalized)  Rationale for Evaluation and Treatment: Rehabilitation  ONSET DATE: Several years ago  SUBJECTIVE:                                                                                                                                                                                      SUBJECTIVE STATEMENT: Pt reports he fell on L shoulder and it hurt. Pt is not looking to get a shoulder replacement. States it started to hurt him more lately. Hurts more at night. Has been easing up the past few weeks after using pain patches.   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 Yes: NPRS scale: 5 or 6 at worst/10 Pain location: Generalized L shoulder pain Pain description: Sharp Aggravating factors: Lifting shoulder up Relieving factors: Not moving it, pain patches  PRECAUTIONS: Fall  WEIGHT BEARING RESTRICTIONS: No  FALLS:  Has patient fallen in last 6 months? Yes. Number of falls 2  LIVING ENVIRONMENT: Lives with: lives with their spouse Lives in: House/apartment Stairs: Yes: Internal: 4 steps; on right going up steps to carport; Has steps to basement but does not have to go down Has following equipment at home: Single point cane  OCCUPATION: Retired -- used to work at [employer]   NEXT MD VISIT: n/a  OBJECTIVE:  (Measures in this section from initial evaluation on 12/20 unless otherwise noted)   DIAGNOSTIC FINDINGS:  MRI 12/1 L shoulder: IMPRESSION: 1. Massive full-thickness tear of the entire supraspinatus and infraspinatus tendon footprints with tendon retraction to the superior aspect of the glenoid.  Moderate supraspinatus and infraspinatus muscle atrophy and intramuscular edema. 2. Moderate partial-thickness  articular sided tearing of the superior subscapularis tendon insertion. This allows the long head of the biceps tendon to be perched on the anterior aspect of the lesser tuberosity as the biceps tendon enters the bicipital groove. 3. Moderate proximal long head of the biceps tendinosis with moderate to high-grade partial-thickness tearing of the tendon as it courses over the lesser tuberosity into the bicipital groove. 4. Chronic type III [place] classification remote acromioclavicular joint injury. 5. Mild-to-moderate glenohumeral cartilage degenerative changes.    PATIENT SURVEYS:  FOTO 61; predicted 4  COGNITION: Overall cognitive status: Within functional limits for tasks assessed     SENSATION: WFL  POSTURE: Humeral head anterior in [place] joint, anteriorly tilted scapula  UPPER EXTREMITY ROM:   Active/passive ROM Right eval Left eval  Shoulder flexion  A: 42 sitting P: 100 supine  Shoulder extension  A: 59 sitting P: 60 sitting  Shoulder abduction  A: 50 sitting P: 80 supine  Shoulder adduction    Shoulder internal rotation  A: T12 sitting hands behind back P: 45 deg abd in supine, 90   Shoulder external rotation  A: 25 sitting P: 45 deg abd in supine, 45   Elbow flexion    Elbow extension    Wrist flexion    Wrist extension    Wrist ulnar deviation    Wrist radial deviation    Wrist pronation    Wrist supination    (Blank rows = not tested) Pt with difficulty tolerating supine - notes that his back stimulator shocks him in this position  UPPER EXTREMITY MMT:  MMT Right eval Left eval  Shoulder flexion 4+ 2+  Shoulder extension 5 4+  Shoulder abduction 4 2+  Shoulder adduction    Shoulder internal rotation 4+ 5  Shoulder external rotation 4+ 3  Middle trapezius    Lower trapezius    Elbow flexion 5 5  Elbow extension 5 5  Wrist flexion     Wrist extension    Wrist ulnar deviation    Wrist radial deviation    Wrist pronation    Wrist supination    Grip strength (lbs)    (Blank rows = not tested)  SHOULDER SPECIAL TESTS: Impingement tests: Painful arc test: positive  Rotator cuff assessment: Drop arm test: positive , Empty can test: positive , Full can test: positive , and Infraspinatus test: negative Biceps assessment: Speed's test: negative  JOINT MOBILITY TESTING:  L GH WFL; noted AC subluxation  PALPATION:  No tenderness to palpation   TODAY'S TREATMENT:                                                                                                                                         DATE: 12/07/22 See HEP below   PATIENT EDUCATION: Education details: Exam findings, POC, initial HEP Person educated: Patient Education method: Explanation, Demonstration, and Handouts Education comprehension: verbalized understanding, returned demonstration,  and needs further education  HOME EXERCISE PROGRAM: Access Code: [email or web address] URL: [email or web address] Date: 12/07/2022 Prepared by: Estill Bamberg April Thurnell Garbe  Exercises - Seated Shoulder Flexion Towel Slide at Table Top  - 1 x daily - 7 x weekly - 2 sets - 10 reps - Seated Shoulder Abduction Towel Slide at Table Top  - 1 x daily - 7 x weekly - 2 sets - 10 reps - Seated Shoulder External Rotation AAROM with Cane and Hand in Neutral  - 1 x daily - 7 x weekly - 2 sets - 10 reps - Seated Scapular Retraction  - 1 x daily - 7 x weekly - 2 sets - 10 reps - Isometric Shoulder Flexion at [place]  - 1 x daily - 7 x weekly - 2 sets - 5 reps - 5 sec hold - Isometric Shoulder Abduction at [place]  - 1 x daily - 7 x weekly - 2 sets - 5 reps - 5 sec hold  ASSESSMENT:  CLINICAL IMPRESSION: Patient is a 88 y.o. M who was seen today for physical therapy evaluation and treatment for L shoulder pain. PMH significant for back pain (limits tolerance to supine), L AC joint  injury and rotator cuff tears. Assessment significant for decreased ROM, strength, and pain affecting reaching tasks with associated muscle tightness. Pt is not interested in shoulder surgery. Pt would benefit from PT to improve his L UE mobility for two handed tasks and overhead tasks.   OBJECTIVE IMPAIRMENTS: decreased mobility, decreased ROM, decreased strength, increased fascial restrictions, impaired UE functional use, postural dysfunction, and pain.   ACTIVITY LIMITATIONS: carrying, lifting, sleeping, dressing, reach over head, and hygiene/grooming  PARTICIPATION LIMITATIONS: meal prep, cleaning, and community activity  PERSONAL FACTORS: Age, Past/current experiences, and Time since onset of injury/illness/exacerbation are also affecting patient's functional outcome.   REHAB POTENTIAL: Fair Multiple comorbidities and shoulder injuries  CLINICAL DECISION MAKING: Evolving/moderate complexity  EVALUATION COMPLEXITY: Moderate   GOALS: Goals reviewed with patient? Yes  SHORT TERM GOALS: Target date: 01/04/2023   Pt will be able to perform HEP with family supervision Baseline: Limited in seeing handout due to visual impairments Goal status: INITIAL  2.  Pt will be able to improve shoulder AAROM to >/=90 deg for flexion and abd Baseline:  Goal status: INITIAL  3.  Pt will have improved shoulder AROM to >/=60 deg flexion and abd to demo improving strength Baseline:  Goal status: INITIAL  4.  Pt will report reduction of pain by >/=25% Baseline:  Goal status: INITIAL   LONG TERM GOALS: Target date: 02/01/2023   Pt will be able to maintain and advance HEP with family supervision Baseline:  Goal status: INITIAL  2.  Pt will have improved shoulder AROM to >/=90 deg flexion and abd  Baseline:  Goal status: INITIAL  3.  Pt will be able to lift at least 5# to counter height for house tasks Baseline: Unable without pain Goal status: INITIAL  4.  Pt will have improved FOTO  score to >/=63 Baseline:  Goal status: INITIAL  5.  Pt will report improved pain by >/=50% with activity Baseline:  Goal status: INITIAL   PLAN:  PT FREQUENCY: 2x/week  PT DURATION: 8 weeks  PLANNED INTERVENTIONS: Therapeutic exercises, Therapeutic activity, Neuromuscular re-education, Balance training, Gait training, Patient/Family education, Self Care, Joint mobilization, Dry Needling, Electrical stimulation, Spinal mobilization, Cryotherapy, Moist heat, Taping, Vasopneumatic device, Ionotophoresis '4mg'$ /ml Dexamethasone, Manual therapy, and Re-evaluation  PLAN FOR NEXT SESSION: Assess response to HEP. Continue  RTC ROM/strengthening. Progress scapular strengthening.   Yovan Leeman April Ma L Grasiela Jonsson, PT 12/07/2022, 2:46 PM

## 2022-12-15 NOTE — Therapy (Signed)
OUTPATIENT PHYSICAL THERAPY TREATMENT NOTE   Patient Name: Jay Tran MRN: [id number] DOB:29-Oct-1934, 88 y.o., male Today's Date: 12/16/2022  PCP: Claris Gower   REFERRING PROVIDER: Vanetta Mulders, MD  END OF SESSION:   PT End of Session - 12/16/22 1146     Visit Number 2    Number of Visits 16    Date for PT Re-Evaluation 02/01/23    Authorization Type Medicare    Progress Note Due on Visit 10    PT Start Time 1146    PT Stop Time 1219    PT Time Calculation (min) 33 min    Activity Tolerance Patient limited by pain;Patient tolerated treatment well    Behavior During Therapy [facility] for tasks assessed/performed             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 (HCC)    Hx: of after 2013 back surgery   GERD (gastroesophageal reflux disease)    Hearing aid worn    Hx: of right ear   Hyperlipemia    Hypertension    Lumbar herniated disc    Lumbar stenosis    Macular degeneration right eye dry   PONV (postoperative nausea and vomiting)    takes iv nausea meds    Seasonal allergies    Hx: of   Stroke [facility])    Past Surgical History:  Procedure Laterality Date   BACK SURGERY  2013, x 3 with dr Ronnald Ramp 2014, 2016 and 2019   x 4, takes back injections monthly   COLONOSCOPY     Hx: of   ENDARTERECTOMY Right 02/07/2020   Procedure: ENDARTERECTOMY CAROTID;  Surgeon: Serafina Mitchell, MD;  Location: [place];  Service: Vascular;  Laterality: Right;   EYE SURGERY     bil catarcts 02/2016   HERNIA REPAIR  several yrs ago   inguinal   LUMBAR LAMINECTOMY  10/26/2012   Procedure: MICRODISCECTOMY LUMBAR LAMINECTOMY;  Surgeon: Marybelle Killings, MD;  Location: [place];  Service: Orthopedics;  Laterality: Right;  Right L4-5 Microdiscectomy   LUMBAR WOUND DEBRIDEMENT N/A 09/15/2021   Procedure: revision of thoracic incision, Irrigation and  debridement of battery pocket wound;  Surgeon: Eustace Moore, MD;  Location: [place];  Service: Neurosurgery;  Laterality: N/A;   MAXIMUM ACCESS (MAS)POSTERIOR LUMBAR INTERBODY FUSION (PLIF) 1 LEVEL N/A 04/15/2015   Procedure: Maximum Access Surgery Posterior Lumbar Interbody Fusion Lumbar two-Lumbar three extension of hardware;  Surgeon: Eustace Moore, MD;  Location: [place] [facility];  Service: Neurosurgery;  Laterality: N/A;   PATCH ANGIOPLASTY Right 02/07/2020   Procedure: PATCH ANGIOPLASTY USING [employer] BIOLOGIC PATCH;  Surgeon: Serafina Mitchell, MD;  Location: [facility];  Service: Vascular;  Laterality: Right;   ROTATOR CUFF REPAIR     right shoulder - 3 times   SPINAL CORD STIMULATOR INSERTION  08/11/2021   TONSILLECTOMY  as child   TRANSURETHRAL RESECTION OF BLADDER TUMOR N/A 02/24/2017   Procedure: TRANSURETHRAL RESECTION OF BLADDER TUMOR (TURBT);  Surgeon: Alexis Frock, MD;  Location: [facility];  Service: Urology;  Laterality: N/A;   TRANSURETHRAL RESECTION OF PROSTATE N/A 11/20/2020   Procedure: TRANSURETHRAL RESECTION OF THE PROSTATE (TURP);  Surgeon: Alexis Frock, MD;  Location: [facility];  Service: Urology;  Laterality: N/A;   Patient Active Problem List  Diagnosis Date Noted   Unilateral primary osteoarthritis, right hip 03/30/2022   Prostatic hypertrophy 11/20/2020   Internal carotid artery stenosis 02/05/2020   Essential hypertension 02/05/2020   Benign prostatic hyperplasia with lower urinary tract symptoms 02/05/2020   GERD (gastroesophageal reflux disease) 02/05/2020   Chronic back pain 02/05/2020   Vision loss of right eye 02/04/2020   Wound drainage 08/20/2018   Chronic pain of both shoulders 02/22/2017   S/P lumbar spinal fusion 04/15/2015   HNP (herniated nucleus pulposus), lumbar 10/26/2012    Class: Diagnosis of    REFERRING DIAG:  M25.511,G89.29 (ICD-10-CM) - Chronic right shoulder pain  M25.512,G89.29 (ICD-10-CM) - Chronic left shoulder pain     THERAPY DIAG:  Stiffness of left shoulder, not elsewhere classified  Chronic left shoulder pain  Muscle weakness (generalized)  Rationale for Evaluation and Treatment Rehabilitation  PERTINENT HISTORY: HOH (wears hearing aid on R), impaired vision (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 Continues to have high pain levels mostly when lifting his arm   Eval - Pt reports he fell on L shoulder and it hurt. Pt is not looking to get a shoulder replacement. States it started to hurt him more lately. Hurts more at night. Has been easing up the past few weeks after using pain patches.   PAIN:  Are you having pain? Yes 2-3/10 at present : NPRS scale: 5 or 6 at worst/10 Pain location: Generalized L shoulder pain Pain description: Sharp Aggravating factors: Lifting shoulder up Relieving factors: Not moving it, pain patches   OBJECTIVE: (objective measures completed at initial evaluation unless otherwise dated)   DIAGNOSTIC FINDINGS:  MRI 12/1 L shoulder: IMPRESSION: 1. Massive full-thickness tear of the entire supraspinatus and infraspinatus tendon footprints with tendon retraction to the superior aspect of the glenoid. Moderate supraspinatus and infraspinatus muscle atrophy and intramuscular edema. 2. Moderate partial-thickness articular sided tearing of the superior subscapularis tendon insertion. This allows the long head of the biceps tendon to be perched on the anterior aspect of the lesser  tuberosity as the biceps tendon enters the bicipital groove. 3. Moderate proximal long head of the biceps tendinosis  with moderate to high-grade partial-thickness tearing of the tendon as it courses over the lesser tuberosity into the bicipital groove. 4. Chronic type III [place] classification remote acromioclavicular joint injury. 5. Mild-to-moderate glenohumeral cartilage degenerative changes.     PATIENT SURVEYS:  FOTO 61; predicted 85   COGNITION: Overall cognitive status: Within functional limits for tasks assessed                                  SENSATION: WFL   POSTURE: Humeral head anterior in [place] joint, anteriorly tilted scapula   UPPER EXTREMITY ROM:    Active/passive ROM Right eval Left eval  Shoulder flexion   A: 42 sitting P: 100 supine  Shoulder extension   A: 59 sitting P: 60 sitting  Shoulder abduction   A: 50 sitting P: 80 supine  Shoulder adduction      Shoulder internal rotation   A: T12 sitting hands behind back P: 45 deg abd in supine, 90   Shoulder external rotation   A: 25 sitting P: 45 deg abd in supine, 45   Elbow flexion      Elbow extension      Wrist flexion      Wrist extension      Wrist ulnar deviation      Wrist radial deviation      Wrist pronation      Wrist supination      (Blank rows = not tested) Pt with difficulty tolerating supine - notes that his back stimulator shocks him in this position   UPPER EXTREMITY MMT:   MMT Right eval Left eval  Shoulder flexion 4+ 2+  Shoulder extension 5 4+  Shoulder abduction 4 2+  Shoulder adduction      Shoulder internal rotation 4+ 5  Shoulder external rotation 4+ 3  Middle trapezius      Lower trapezius      Elbow flexion 5 5  Elbow extension 5 5  Wrist flexion      Wrist extension      Wrist ulnar deviation      Wrist radial deviation      Wrist pronation      Wrist supination      Grip strength (lbs)      (Blank rows = not tested)   SHOULDER SPECIAL  TESTS: Impingement tests: Painful arc test: positive  Rotator cuff assessment: Drop arm test: positive , Empty can test: positive , Full can test: positive , and Infraspinatus test: negative Biceps assessment: Speed's test: negative   JOINT MOBILITY TESTING:  L GH WFL; noted AC subluxation   PALPATION:  No tenderness to palpation             TODAY'S TREATMENT:   [facility] Adult PT Treatment:                                                DATE: 12/16/22 Therapeutic Exercise: L shoulder ER iso x8 at [place] with towel, significant cues for form and reduced compensation B IR isos w/ theraball, 2x10, cues for form and posture B ER w/ RTB 2x10, cues for form and posture (more for isometric hold on LUE given limited ability to move through ROM) Biceps curl w/ RTB LUE 2x10 Triceps pull w/ RTB LUE x10 cues for  setup and positioning, second set discontinued after 4 repetitions due to discomfort  Scapular retraction 2x10, seated, cues for form and posture Standing swiss ball flexion at table x10 cues for appropriate ROM and control                                                                                                                                       DATE: 12/07/22 See HEP below     PATIENT EDUCATION: Education details: rationale for interventions, monitoring symptoms, HEP  Person educated: Patient Education method: Explanation, Demonstration, and Handouts Education comprehension: verbalized understanding, returned demonstration, and needs further education   HOME EXERCISE PROGRAM: Access Code: [email or web address] URL: [email or web address] Date: 12/07/2022 Prepared by: Estill Bamberg April Thurnell Garbe   Exercises - Seated Shoulder Flexion Towel Slide at Table Top  - 1 x daily - 7 x weekly - 2 sets - 10 reps - Seated Shoulder Abduction Towel Slide at Table Top  - 1 x daily - 7 x weekly - 2 sets - 10 reps - Seated Shoulder External Rotation AAROM with Cane and Hand in Neutral  - 1 x  daily - 7 x weekly - 2 sets - 10 reps - Seated Scapular Retraction  - 1 x daily - 7 x weekly - 2 sets - 10 reps - Isometric Shoulder Flexion at [place]  - 1 x daily - 7 x weekly - 2 sets - 5 reps - 5 sec hold - Isometric Shoulder Abduction at [place]  - 1 x daily - 7 x weekly - 2 sets - 5 reps - 5 sec hold   ASSESSMENT:   CLINICAL IMPRESSION: 12/16/2022 Pt arrives with 2-3/10 pain on NPS, denies any significant changes since eval and reports some difficulty adhering to HEP. Pt initially tolerates exercise relatively well although he does require significant cueing given reported visual/hearing impairments. Tricep pull deferred in second set due to onset of discomfort which improves after cessation. Pt reports no resting pain after theraband exercises but then reports some irritation in shoulder after AAROM exercise. No adverse events, education on activity modification and anticipated course of soreness with exercise progress, pt verbalizes understanding. Departs with 0/10 resting pain but does endorse increased pain with raising arm which he states is typical for him. Pt departs today's session in no acute distress, all voiced questions/concerns addressed appropriately from PT perspective.     Eval - Patient is a 88 y.o. M who was seen today for physical therapy evaluation and treatment for L shoulder pain. PMH significant for back pain (limits tolerance to supine), L AC joint injury and rotator cuff tears. Assessment significant for decreased ROM, strength, and pain affecting reaching tasks with associated muscle tightness. Pt is not interested in shoulder surgery. Pt would benefit from PT to improve his L UE mobility for two handed tasks and overhead tasks.    OBJECTIVE IMPAIRMENTS: decreased mobility, decreased ROM, decreased strength, increased  fascial restrictions, impaired UE functional use, postural dysfunction, and pain.    ACTIVITY LIMITATIONS: carrying, lifting, sleeping, dressing, reach over  head, and hygiene/grooming   PARTICIPATION LIMITATIONS: meal prep, cleaning, and community activity   PERSONAL FACTORS: Age, Past/current experiences, and Time since onset of injury/illness/exacerbation are also affecting patient's functional outcome.    REHAB POTENTIAL: Fair Multiple comorbidities and shoulder injuries   CLINICAL DECISION MAKING: Evolving/moderate complexity   EVALUATION COMPLEXITY: Moderate     GOALS: Goals reviewed with patient? Yes   SHORT TERM GOALS: Target date: 01/04/2023     Pt will be able to perform HEP with family supervision Baseline: Limited in seeing handout due to visual impairments Goal status: INITIAL   2.  Pt will be able to improve shoulder AAROM to >/=90 deg for flexion and abd Baseline:  Goal status: INITIAL   3.  Pt will have improved shoulder AROM to >/=60 deg flexion and abd to demo improving strength Baseline:  Goal status: INITIAL   4.  Pt will report reduction of pain by >/=25% Baseline:  Goal status: INITIAL     LONG TERM GOALS: Target date: 02/01/2023     Pt will be able to maintain and advance HEP with family supervision Baseline:  Goal status: INITIAL   2.  Pt will have improved shoulder AROM to >/=90 deg flexion and abd  Baseline:  Goal status: INITIAL   3.  Pt will be able to lift at least 5# to counter height for house tasks Baseline: Unable without pain Goal status: INITIAL   4.  Pt will have improved FOTO score to >/=63 Baseline:  Goal status: INITIAL   5.  Pt will report improved pain by >/=50% with activity Baseline:  Goal status: INITIAL     PLAN:   PT FREQUENCY: 2x/week   PT DURATION: 8 weeks   PLANNED INTERVENTIONS: Therapeutic exercises, Therapeutic activity, Neuromuscular re-education, Balance training, Gait training, Patient/Family education, Self Care, Joint mobilization, Dry Needling, Electrical stimulation, Spinal mobilization, Cryotherapy, Moist heat, Taping, Vasopneumatic device,  Ionotophoresis '4mg'$ /ml Dexamethasone, Manual therapy, and Re-evaluation   PLAN FOR NEXT SESSION: Assess response to HEP. Continue RTC ROM/strengthening. Progress scapular strengthening.   Leeroy Cha PT, DPT 12/16/2022 12:30 PM

## 2022-12-16 ENCOUNTER — Encounter: Payer: Self-pay | Admitting: Physical Therapy

## 2022-12-16 ENCOUNTER — Ambulatory Visit (INDEPENDENT_AMBULATORY_CARE_PROVIDER_SITE_OTHER): Payer: Medicare Other | Admitting: Physical Therapy

## 2022-12-16 DIAGNOSIS — M6281 Muscle weakness (generalized): Secondary | ICD-10-CM | POA: Diagnosis not present

## 2022-12-16 DIAGNOSIS — G8929 Other chronic pain: Secondary | ICD-10-CM | POA: Diagnosis not present

## 2022-12-16 DIAGNOSIS — M25612 Stiffness of left shoulder, not elsewhere classified: Secondary | ICD-10-CM | POA: Diagnosis not present

## 2022-12-16 DIAGNOSIS — M25512 Pain in left shoulder: Secondary | ICD-10-CM

## 2022-12-21 ENCOUNTER — Encounter: Payer: Self-pay | Admitting: Physical Therapy

## 2022-12-21 ENCOUNTER — Ambulatory Visit (INDEPENDENT_AMBULATORY_CARE_PROVIDER_SITE_OTHER): Payer: Medicare Other | Admitting: Physical Therapy

## 2022-12-21 DIAGNOSIS — M25612 Stiffness of left shoulder, not elsewhere classified: Secondary | ICD-10-CM

## 2022-12-21 DIAGNOSIS — M25512 Pain in left shoulder: Secondary | ICD-10-CM

## 2022-12-21 DIAGNOSIS — G8929 Other chronic pain: Secondary | ICD-10-CM | POA: Diagnosis not present

## 2022-12-21 DIAGNOSIS — M6281 Muscle weakness (generalized): Secondary | ICD-10-CM

## 2022-12-21 NOTE — Therapy (Signed)
OUTPATIENT PHYSICAL THERAPY TREATMENT NOTE   Patient Name: Jay Tran MRN: [id number] DOB:June 15, 1934, 89 y.o., male Today's Date: 12/21/2022  PCP: Claris Gower   REFERRING PROVIDER: Vanetta Mulders, MD  END OF SESSION:   PT End of Session - 12/21/22 1414     Visit Number 3    Number of Visits 16    Date for PT Re-Evaluation 02/01/23    Authorization Type Medicare    Progress Note Due on Visit 10    PT Start Time 1347    PT Stop Time 1426    PT Time Calculation (min) 39 min    Activity Tolerance Patient tolerated treatment well    Behavior During Therapy WFL for tasks assessed/performed              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 ([place])    Hx: of after 2013 back surgery   GERD (gastroesophageal reflux disease)    Hearing aid worn    Hx: of right ear   Hyperlipemia    Hypertension    Lumbar herniated disc    Lumbar stenosis    Macular degeneration right eye dry   PONV (postoperative nausea and vomiting)    takes iv nausea meds    Seasonal allergies    Hx: of   Stroke [facility])    Past Surgical History:  Procedure Laterality Date   BACK SURGERY  2013, x 3 with dr Ronnald Ramp 2014, 2016 and 2019   x 4, takes back injections monthly   COLONOSCOPY     Hx: of   ENDARTERECTOMY Right 02/07/2020   Procedure: ENDARTERECTOMY CAROTID;  Surgeon: Serafina Mitchell, MD;  Location: [place];  Service: Vascular;  Laterality: Right;   EYE SURGERY     bil catarcts 02/2016   HERNIA REPAIR  several yrs ago   inguinal   LUMBAR LAMINECTOMY  10/26/2012   Procedure: MICRODISCECTOMY LUMBAR LAMINECTOMY;  Surgeon: Marybelle Killings, MD;  Location: [place];  Service: Orthopedics;  Laterality: Right;  Right L4-5 Microdiscectomy   LUMBAR WOUND DEBRIDEMENT N/A 09/15/2021   Procedure: revision of thoracic incision, Irrigation and debridement of battery  pocket wound;  Surgeon: Eustace Moore, MD;  Location: [place];  Service: Neurosurgery;  Laterality: N/A;   MAXIMUM ACCESS (MAS)POSTERIOR LUMBAR INTERBODY FUSION (PLIF) 1 LEVEL N/A 04/15/2015   Procedure: Maximum Access Surgery Posterior Lumbar Interbody Fusion Lumbar two-Lumbar three extension of hardware;  Surgeon: Eustace Moore, MD;  Location: [facility];  Service: Neurosurgery;  Laterality: N/A;   PATCH ANGIOPLASTY Right 02/07/2020   Procedure: PATCH ANGIOPLASTY USING [employer] BIOLOGIC PATCH;  Surgeon: Serafina Mitchell, MD;  Location: [facility];  Service: Vascular;  Laterality: Right;   ROTATOR CUFF REPAIR     right shoulder - 3 times   SPINAL CORD STIMULATOR INSERTION  08/11/2021   TONSILLECTOMY  as child   TRANSURETHRAL RESECTION OF BLADDER TUMOR N/A 02/24/2017   Procedure: TRANSURETHRAL RESECTION OF BLADDER TUMOR (TURBT);  Surgeon: Alexis Frock, MD;  Location: [facility];  Service: Urology;  Laterality: N/A;   TRANSURETHRAL RESECTION OF PROSTATE N/A 11/20/2020   Procedure: TRANSURETHRAL RESECTION OF THE PROSTATE (TURP);  Surgeon: Alexis Frock, MD;  Location: [facility];  Service: Urology;  Laterality: N/A;   Patient Active Problem List   Diagnosis Date  Noted   Unilateral primary osteoarthritis, right hip 03/30/2022   Prostatic hypertrophy 11/20/2020   Internal carotid artery stenosis 02/05/2020   Essential hypertension 02/05/2020   Benign prostatic hyperplasia with lower urinary tract symptoms 02/05/2020   GERD (gastroesophageal reflux disease) 02/05/2020   Chronic back pain 02/05/2020   Vision loss of right eye 02/04/2020   Wound drainage 08/20/2018   Chronic pain of both shoulders 02/22/2017   S/P lumbar spinal fusion 04/15/2015   HNP (herniated nucleus pulposus), lumbar 10/26/2012    Class: Diagnosis of    REFERRING DIAG:  M25.511,G89.29 (ICD-10-CM) - Chronic right shoulder pain  M25.512,G89.29 (ICD-10-CM) - Chronic left shoulder pain    THERAPY DIAG:   Stiffness of left shoulder, not elsewhere classified  Chronic left shoulder pain  Muscle weakness (generalized)  Rationale for Evaluation and Treatment Rehabilitation  PERTINENT HISTORY: HOH (wears hearing aid on R), impaired vision (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 I'll work on whatever you want [place] to   PAIN:  Are you having pain? Yes 3/10 at present  Pain location: Generalized L shoulder pain Pain description: Sharp, dull, etc really depends on how I'm moving the shoulder  Aggravating factors: Lifting shoulder up Relieving factors: not sure, resting it    OBJECTIVE: (objective measures completed at initial evaluation unless otherwise dated)   DIAGNOSTIC FINDINGS:  MRI 12/1 L shoulder: IMPRESSION: 1. Massive full-thickness tear of the entire supraspinatus and infraspinatus tendon footprints with tendon retraction to the superior aspect of the glenoid. Moderate supraspinatus and infraspinatus muscle atrophy and intramuscular edema. 2. Moderate partial-thickness articular sided tearing of the superior subscapularis tendon insertion. This allows the long head of the biceps tendon to be perched on the anterior aspect of the lesser tuberosity as the biceps tendon enters the bicipital groove. 3. Moderate proximal long head of the biceps tendinosis with moderate to high-grade partial-thickness tearing of the tendon as it courses over the lesser tuberosity into the bicipital groove. 4. Chronic type III [place] classification remote  acromioclavicular joint injury. 5. Mild-to-moderate glenohumeral cartilage degenerative changes.     PATIENT SURVEYS:  FOTO 61; predicted 49   COGNITION: Overall  cognitive status: Within functional limits for tasks assessed                                  SENSATION: WFL   POSTURE: Humeral head anterior in [place] joint, anteriorly tilted scapula   UPPER EXTREMITY ROM:    Active/passive ROM Right eval Left eval  Shoulder flexion   A: 42 sitting P: 100 supine  Shoulder extension   A: 59 sitting P: 60 sitting  Shoulder abduction   A: 50 sitting P: 80 supine  Shoulder adduction      Shoulder internal rotation   A: T12 sitting hands behind back P: 45 deg abd in supine, 90   Shoulder external rotation   A: 25 sitting P: 45 deg abd in supine, 45   Elbow flexion      Elbow extension      Wrist flexion      Wrist extension      Wrist ulnar deviation      Wrist radial deviation      Wrist pronation      Wrist supination      (Blank rows = not tested) Pt with difficulty tolerating supine - notes that his back stimulator shocks him in this position   UPPER EXTREMITY MMT:   MMT Right eval Left eval  Shoulder flexion 4+ 2+  Shoulder extension 5 4+  Shoulder abduction 4 2+  Shoulder adduction      Shoulder internal rotation 4+ 5  Shoulder external rotation 4+ 3  Middle trapezius      Lower trapezius      Elbow flexion 5 5  Elbow extension 5 5  Wrist flexion      Wrist extension      Wrist ulnar deviation      Wrist radial deviation      Wrist pronation      Wrist supination      Grip strength (lbs)      (Blank rows = not tested)   SHOULDER SPECIAL TESTS: Impingement tests: Painful arc test: positive  Rotator cuff assessment: Drop arm test: positive , Empty can test: positive , Full can test: positive , and Infraspinatus test: negative Biceps assessment: Speed's test: negative   JOINT MOBILITY TESTING:  L GH WFL; noted AC subluxation   PALPATION:  No  tenderness to palpation             TODAY'S TREATMENT:     12/21/22  TherEx  Stretches into L shoulder flexion and ABD to tolerated ROM, stretches into ER/IR at tolerated ABD Scapular retraction red TB 1x15  Shoulder extension red TB 1x10  Backward shoulder rolls x15 mod cues for form       Manual  Tennis ball STM L anterior delt and biceps   [facility] Adult PT Treatment:                                                DATE: 12/16/22 Therapeutic Exercise: L shoulder ER iso x8 at [place] with towel, significant cues for form and reduced compensation B IR isos w/ theraball, 2x10, cues for form and posture B ER w/ RTB 2x10, cues for form and posture (more for isometric hold on LUE given limited ability to move through ROM) Biceps curl w/ RTB LUE  2x10 Triceps pull w/ RTB LUE x10 cues for setup and positioning, second set discontinued after 4 repetitions due to discomfort  Scapular retraction 2x10, seated, cues for form and posture Standing swiss ball flexion at table x10 cues for appropriate ROM and control                                                                                                                                       DATE: 12/07/22 See HEP below     PATIENT EDUCATION: Education details: exercise form/purpose, role of STM in reducing pain Person educated: Patient Education method: Explanation, Demonstration, and Handouts Education comprehension: verbalized understanding, returned demonstration, and needs further education   HOME EXERCISE PROGRAM: Access Code: [email or web address] URL: [email or web address] Date: 12/07/2022 Prepared by: Estill Bamberg April Thurnell Garbe   Exercises - Seated Shoulder Flexion Towel Slide at Table Top  - 1 x daily - 7 x weekly - 2 sets - 10 reps - Seated Shoulder Abduction Towel Slide at Table Top  - 1 x daily - 7 x weekly - 2 sets - 10 reps - Seated Shoulder External Rotation AAROM with Cane and Hand in Neutral  - 1 x daily - 7 x  weekly - 2 sets - 10 reps - Seated Scapular Retraction  - 1 x daily - 7 x weekly - 2 sets - 10 reps - Isometric Shoulder Flexion at [place]  - 1 x daily - 7 x weekly - 2 sets - 5 reps - 5 sec hold - Isometric Shoulder Abduction at [place]  - 1 x daily - 7 x weekly - 2 sets - 5 reps - 5 sec hold   ASSESSMENT:   CLINICAL IMPRESSION:    Mr. Rachal arrives today doing OK, still having shoulder pain especially when trying to lift arm. Spent a bit more time working on ROM today as this is still very limited, also noted deltoid spasms which I addressed with tennis ball STM with good release noted. Otherwise worked on continuation of functional strengthening this session. Will continue efforts but still had a lot of compensations and limited ROM at EOS.    OBJECTIVE IMPAIRMENTS: decreased mobility, decreased ROM, decreased strength, increased fascial restrictions, impaired UE functional use, postural dysfunction, and pain.    ACTIVITY LIMITATIONS: carrying, lifting, sleeping, dressing, reach over head, and hygiene/grooming   PARTICIPATION LIMITATIONS: meal prep, cleaning, and community activity   PERSONAL FACTORS: Age, Past/current experiences, and Time since onset of injury/illness/exacerbation are also affecting patient's functional outcome.    REHAB POTENTIAL: Fair Multiple comorbidities and shoulder injuries   CLINICAL DECISION MAKING: Evolving/moderate complexity   EVALUATION COMPLEXITY: Moderate     GOALS: Goals reviewed with patient? Yes   SHORT TERM GOALS: Target date: 01/04/2023     Pt will be able to perform HEP with family supervision Baseline: Limited in seeing handout due to visual impairments Goal status: INITIAL   2.  Pt will be able to improve shoulder AAROM to >/=90 deg for flexion and abd Baseline:  Goal status: INITIAL   3.  Pt will have improved shoulder AROM to >/=60 deg flexion and abd to demo improving strength Baseline:  Goal status: INITIAL   4.  Pt will report  reduction of pain by >/=25% Baseline:  Goal status: INITIAL     LONG TERM GOALS: Target date: 02/01/2023     Pt will be able to maintain and advance HEP with family supervision Baseline:  Goal status: INITIAL   2.  Pt will have improved shoulder AROM to >/=90 deg flexion and abd  Baseline:  Goal status: INITIAL   3.  Pt will be able to lift at least 5# to counter height for house tasks Baseline: Unable without pain Goal status: INITIAL   4.  Pt will have improved FOTO score to >/=63 Baseline:  Goal status: INITIAL   5.  Pt will report improved pain by >/=50% with activity Baseline:  Goal status: INITIAL     PLAN:   PT FREQUENCY: 2x/week   PT DURATION: 8 weeks   PLANNED INTERVENTIONS: Therapeutic exercises, Therapeutic activity, Neuromuscular re-education, Balance training, Gait training, Patient/Family education, Self Care, Joint mobilization, Dry Needling, Electrical stimulation, Spinal mobilization, Cryotherapy, Moist heat, Taping, Vasopneumatic device, Ionotophoresis '4mg'$ /ml Dexamethasone, Manual therapy, and Re-evaluation   PLAN FOR NEXT SESSION: Assess response to HEP. Continue RTC ROM/strengthening. Progress scapular strengthening.  Deniece Ree PT DPT PN2

## 2022-12-23 ENCOUNTER — Encounter: Payer: Self-pay | Admitting: Rehabilitative and Restorative Service Providers"

## 2022-12-23 ENCOUNTER — Ambulatory Visit (INDEPENDENT_AMBULATORY_CARE_PROVIDER_SITE_OTHER): Payer: Medicare Other | Admitting: Rehabilitative and Restorative Service Providers"

## 2022-12-23 ENCOUNTER — Encounter (INDEPENDENT_AMBULATORY_CARE_PROVIDER_SITE_OTHER): Payer: Medicare Other | Admitting: Ophthalmology

## 2022-12-23 DIAGNOSIS — M25512 Pain in left shoulder: Secondary | ICD-10-CM

## 2022-12-23 DIAGNOSIS — M6281 Muscle weakness (generalized): Secondary | ICD-10-CM

## 2022-12-23 DIAGNOSIS — M25612 Stiffness of left shoulder, not elsewhere classified: Secondary | ICD-10-CM | POA: Diagnosis not present

## 2022-12-23 DIAGNOSIS — G8929 Other chronic pain: Secondary | ICD-10-CM | POA: Diagnosis not present

## 2022-12-23 NOTE — Therapy (Signed)
OUTPATIENT PHYSICAL THERAPY TREATMENT NOTE   Patient Name: Jay Tran MRN: [id number] DOB:[birth year], 89 y.o., male Today's Date: 12/23/2022  PCP: Claris Gower   REFERRING PROVIDER: Vanetta Mulders, MD  END OF SESSION:   PT End of Session - 12/23/22 1334     Visit Number 4    Number of Visits 16    Date for PT Re-Evaluation 02/01/23    Authorization Type Medicare    Progress Note Due on Visit 10    PT Start Time 3419    PT Stop Time 1421    PT Time Calculation (min) 47 min    Activity Tolerance Patient tolerated treatment well;No increased pain;Patient limited by fatigue    Behavior During Therapy [facility] for tasks assessed/performed             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 (HCC)    Hx: of after 2013 back surgery   GERD (gastroesophageal reflux disease)    Hearing aid worn    Hx: of right ear   Hyperlipemia    Hypertension    Lumbar herniated disc    Lumbar stenosis    Macular degeneration right eye dry   PONV (postoperative nausea and vomiting)    takes iv nausea meds    Seasonal allergies    Hx: of   Stroke [facility])    Past Surgical History:  Procedure Laterality Date   BACK SURGERY  2013, x 3 with dr Ronnald Ramp 2014, 2016 and 2019   x 4, takes back injections monthly   COLONOSCOPY     Hx: of   ENDARTERECTOMY Right 02/07/2020   Procedure: ENDARTERECTOMY CAROTID;  Surgeon: Serafina Mitchell, MD;  Location: [place];  Service: Vascular;  Laterality: Right;   EYE SURGERY     bil catarcts 02/2016   HERNIA REPAIR  several yrs ago   inguinal   LUMBAR LAMINECTOMY  10/26/2012   Procedure: MICRODISCECTOMY LUMBAR LAMINECTOMY;  Surgeon: Marybelle Killings, MD;  Location: [place];  Service: Orthopedics;  Laterality: Right;  Right L4-5 Microdiscectomy   LUMBAR WOUND DEBRIDEMENT N/A 09/15/2021   Procedure: revision of thoracic  incision, Irrigation and debridement of battery pocket wound;  Surgeon: Eustace Moore, MD;  Location: [place];  Service: Neurosurgery;  Laterality: N/A;   MAXIMUM ACCESS (MAS)POSTERIOR LUMBAR INTERBODY FUSION (PLIF) 1 LEVEL N/A 04/15/2015   Procedure: Maximum Access Surgery Posterior Lumbar Interbody Fusion Lumbar two-Lumbar three extension of hardware;  Surgeon: Eustace Moore, MD;  Location: [place] [facility];  Service: Neurosurgery;  Laterality: N/A;   PATCH ANGIOPLASTY Right 02/07/2020   Procedure: PATCH ANGIOPLASTY USING [employer] Bash BIOLOGIC PATCH;  Surgeon: Serafina Mitchell, MD;  Location: [facility];  Service: Vascular;  Laterality: Right;   ROTATOR CUFF REPAIR     right shoulder - 3 times   SPINAL CORD STIMULATOR INSERTION  08/11/2021   TONSILLECTOMY  as child   TRANSURETHRAL RESECTION OF BLADDER TUMOR N/A 02/24/2017   Procedure: TRANSURETHRAL RESECTION OF BLADDER TUMOR (TURBT);  Surgeon: Alexis Frock, MD;  Location: [facility];  Service: Urology;  Laterality: N/A;   TRANSURETHRAL RESECTION OF PROSTATE N/A 11/20/2020   Procedure: TRANSURETHRAL RESECTION OF THE PROSTATE (TURP);  Surgeon: Alexis Frock, MD;  Location: [facility];  Service: Urology;  Laterality: N/A;   Patient Active Problem List  Diagnosis Date Noted   Unilateral primary osteoarthritis, right hip 03/30/2022   Prostatic hypertrophy 11/20/2020   Internal carotid artery stenosis 02/05/2020   Essential hypertension 02/05/2020   Benign prostatic hyperplasia with lower urinary tract symptoms 02/05/2020   GERD (gastroesophageal reflux disease) 02/05/2020   Chronic back pain 02/05/2020   Vision loss of right eye 02/04/2020   Wound drainage 08/20/2018   Chronic pain of both shoulders 02/22/2017   S/P lumbar spinal fusion 04/15/2015   HNP (herniated nucleus pulposus), lumbar 10/26/2012    Class: Diagnosis of    REFERRING DIAG:  M25.511,G89.29 (ICD-10-CM) - Chronic right shoulder pain  M25.512,G89.29 (ICD-10-CM) -  Chronic left shoulder pain    THERAPY DIAG:  Stiffness of left shoulder, not elsewhere classified  Chronic left shoulder pain  Muscle weakness (generalized)  Rationale for Evaluation and Treatment Rehabilitation  PERTINENT HISTORY: HOH (wears hearing aid on R), impaired vision (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  He feels his arm bike is most beneficial and he has been most compliant with it.  PAIN:  Are you having pain? Yes 0-5/10.  5/10 is brief and stabbing with reaching and overhead function. Pain location: Generalized L shoulder pain Pain description: Sharp, dull, etc really depends on how I'm moving the shoulder  Aggravating factors: Lifting shoulder up Relieving factors: not sure, resting it    OBJECTIVE: (objective measures completed at initial evaluation unless otherwise dated)   DIAGNOSTIC FINDINGS:  MRI 12/1 L shoulder: IMPRESSION: 1. Massive full-thickness tear of the entire supraspinatus and infraspinatus tendon footprints with tendon retraction to the superior aspect of the glenoid. Moderate supraspinatus and infraspinatus muscle atrophy and intramuscular edema. 2. Moderate partial-thickness articular sided tearing of the superior subscapularis tendon insertion. This allows the long head of the biceps tendon to be perched on the anterior aspect of the lesser tuberosity as the biceps tendon enters the bicipital groove. 3. Moderate proximal long head of the biceps tendinosis with moderate to high-grade  partial-thickness tearing of the tendon as it courses over the lesser tuberosity into the bicipital groove. 4. Chronic type III [place] classification remote acromioclavicular joint injury. 5. Mild-to-moderate glenohumeral cartilage degenerative  changes.     PATIENT SURVEYS:  FOTO 61; predicted 26   COGNITION: Overall cognitive status: Within functional limits for tasks assessed                                  SENSATION: WFL   POSTURE: Humeral head anterior in [facility] joint, anteriorly tilted scapula   UPPER EXTREMITY ROM:    Active/passive ROM Right eval Left eval 12/23/2022 Left  Shoulder flexion   A: 42 sitting P: 100 supine 110  Shoulder extension   A: 59 sitting P: 60 sitting   Shoulder abduction   A: 50 sitting P: 80 supine   Shoulder horizontal adduction     25  Shoulder internal rotation   A: T12 sitting hands behind back P: 45 deg abd in supine, 90  70  Shoulder external rotation   A: 25 sitting P: 45 deg abd in supine, 45  75  Elbow flexion       Elbow extension       Wrist flexion       Wrist extension       Wrist ulnar deviation       Wrist radial deviation       Wrist pronation       Wrist supination       (Blank rows = not tested) Pt with difficulty tolerating supine - notes that his back stimulator shocks him in this position   UPPER EXTREMITY MMT:   MMT Right eval Left eval  Shoulder flexion 4+ 2+  Shoulder extension 5 4+  Shoulder abduction 4 2+  Shoulder adduction      Shoulder internal rotation 4+ 5  Shoulder external rotation 4+ 3  Middle trapezius      Lower trapezius      Elbow flexion 5 5  Elbow extension 5 5  Wrist flexion      Wrist extension      Wrist ulnar deviation      Wrist radial deviation      Wrist pronation      Wrist supination      Grip strength (lbs)      (Blank rows = not tested)   SHOULDER SPECIAL TESTS: Impingement tests: Painful arc test: positive  Rotator cuff assessment: Drop arm test: positive , Empty can  test: positive , Full can test: positive , and Infraspinatus test: negative Biceps assessment: Speed's test: negative   JOINT MOBILITY TESTING:  L GH WFL; noted AC subluxation   PALPATION:  No tenderness to palpation             TODAY'S TREATMENT:   12/23/2021 Shoulder ER Thera-Band 2 sets of 10 red slow eccentrics Shoulder IR Thera-Band 2 sets of 10 red slow eccentrics Supine arm raise (serratus anterior reach) 2 sets of 10 for 3 seconds Seated shoulder blade pinch 10 x 5 seconds AAROM left shoulder flexion, horizontal adduction, IR and ER Comprehensive review of Wentworth's home exercise program with his wife emphasizing the above listed activities   12/21/22  TherEx  Stretches into L shoulder flexion and ABD to tolerated ROM, stretches into ER/IR at tolerated ABD Scapular retraction red TB 1x15  Shoulder extension red TB 1x10  Backward shoulder rolls x15 mod cues for form    Manual  Tennis ball STM L anterior delt and biceps    [facility] Adult PT Treatment:  DATE: 12/16/22 Therapeutic Exercise: L shoulder ER iso x8 at [place] with towel, significant cues for form and reduced compensation B IR isos w/ theraball, 2x10, cues for form and posture B ER w/ RTB 2x10, cues for form and posture (more for isometric hold on LUE given limited ability to move through ROM) Biceps curl w/ RTB LUE 2x10 Triceps pull w/ RTB LUE x10 cues for setup and positioning, second set discontinued after 4 repetitions due to discomfort  Scapular retraction 2x10, seated, cues for form and posture Standing swiss ball flexion at table x10 cues for appropriate ROM and control                                                                                                                                       PATIENT EDUCATION: Education details: exercise form/purpose, role of STM in reducing pain Person educated: Patient Education method: Explanation, Demonstration, and  Handouts Education comprehension: verbalized understanding, returned demonstration, and needs further education   HOME EXERCISE PROGRAM: Access Code: [email or web address] URL: [email or web address] Date: 12/23/2022 Prepared by: Vista Mink  Exercises - Seated Shoulder Flexion Towel Slide at Table Top  - 1 x daily - 7 x weekly - 2 sets - 10 reps - Seated Shoulder Abduction Towel Slide at Table Top  - 1 x daily - 7 x weekly - 2 sets - 10 reps - Seated Shoulder External Rotation AAROM with Cane and Hand in Neutral  - 1 x daily - 7 x weekly - 2 sets - 10 reps - Seated Scapular Retraction  - 5 x daily - 7 x weekly - 1 sets - 5 reps - Isometric Shoulder Flexion at [place]  - 1 x daily - 7 x weekly - 2 sets - 5 reps - 5 sec hold - Isometric Shoulder Abduction at [place]  - 1 x daily - 7 x weekly - 2 sets - 5 reps - 5 sec hold - Supine Scapular Protraction in Flexion with Dumbbells  - 2 x daily - 7 x weekly - 2 sets - 10 reps - 3 seconds hold - Shoulder External Rotation with Anchored Resistance  - 1 x daily - 7 x weekly - 2 sets - 10 reps - 3 hold - Shoulder Internal Rotation with Resistance  - 1 x daily - 7 x weekly - 2 sets - 10 reps - 3 hold   ASSESSMENT:   CLINICAL IMPRESSION: [place] Notes progress with his left shoulder AROM, pain and function since starting physical therapy.  Today, we focused on anterior and posterior scapular and rotator cuff strength as his AROM was pretty good and appears to be limited more by impingement due to weakness.  Cadence was given pictures of his new exercises and encouraged to complete them as prescribed.  Continue emphasis on scapular and rotator cuff strength as Dam[place]ay was told he is a shoulder replacement candidate and he is wanting to maximize  the pain-free function of his left shoulder.   OBJECTIVE IMPAIRMENTS: decreased mobility, decreased ROM, decreased strength, increased fascial restrictions, impaired UE functional use, postural dysfunction, and pain.     ACTIVITY LIMITATIONS: carrying, lifting, sleeping, dressing, reach over head, and hygiene/grooming   PARTICIPATION LIMITATIONS: meal prep, cleaning, and community activity   PERSONAL FACTORS: Age, Past/current experiences, and Time since onset of injury/illness/exacerbation are also affecting patient's functional outcome.    REHAB POTENTIAL: Fair Multiple comorbidities and shoulder injuries   CLINICAL DECISION MAKING: Evolving/moderate complexity   EVALUATION COMPLEXITY: Moderate     GOALS: Goals reviewed with patient? Yes   SHORT TERM GOALS: Target date: 01/04/2023     Pt will be able to perform HEP with family supervision Baseline: Limited in seeing handout due to visual impairments Goal status: On Going 12/23/2022   2.  Pt will be able to improve shoulder AAROM to >/=90 deg for flexion and abd Baseline:  Goal status: On Going 12/23/2022   3.  Pt will have improved shoulder AROM to >/=60 deg flexion and abd to demo improving strength Baseline:  Goal status: Met 12/23/2022   4.  Pt will report reduction of pain by >/=25% Baseline:  Goal status: On Going 12/23/2022     LONG TERM GOALS: Target date: 02/01/2023     Pt will be able to maintain and advance HEP with family supervision Baseline:  Goal status: Ion Going 12/23/2022   2.  Pt will have improved shoulder AROM to >/=90 deg flexion and abd  Baseline:  Goal status: On Going 12/23/2022   3.  Pt will be able to lift at least 5# to counter height for house tasks Baseline: Unable without pain Goal status: On Going 12/23/2022 4.  Pt will have improved FOTO score to >/=63 Baseline:  Goal status: INITIAL   5.  Pt will report improved pain by >/=50% with activity Baseline:  Goal status: On Going 12/23/2022     PLAN:   PT FREQUENCY: 2x/week   PT DURATION: 8 weeks   PLANNED INTERVENTIONS: Therapeutic exercises, Therapeutic activity, Neuromuscular re-education, Balance training, Gait training, Patient/Family education, Self  Care, Joint mobilization, Dry Needling, Electrical stimulation, Spinal mobilization, Cryotherapy, Moist heat, Taping, Vasopneumatic device, Ionotophoresis '4mg'$ /ml Dexamethasone, Manual therapy, and Re-evaluation   PLAN FOR NEXT SESSION: Progress scapular and rotator cuff strengthening emphasis.  Farley Ly PT, MPT

## 2022-12-26 ENCOUNTER — Encounter: Payer: Self-pay | Admitting: Rehabilitative and Restorative Service Providers"

## 2022-12-26 ENCOUNTER — Ambulatory Visit (INDEPENDENT_AMBULATORY_CARE_PROVIDER_SITE_OTHER): Payer: Medicare Other | Admitting: Physical Therapy

## 2022-12-26 DIAGNOSIS — G8929 Other chronic pain: Secondary | ICD-10-CM | POA: Diagnosis not present

## 2022-12-26 DIAGNOSIS — M25612 Stiffness of left shoulder, not elsewhere classified: Secondary | ICD-10-CM

## 2022-12-26 DIAGNOSIS — M6281 Muscle weakness (generalized): Secondary | ICD-10-CM | POA: Diagnosis not present

## 2022-12-26 DIAGNOSIS — M25512 Pain in left shoulder: Secondary | ICD-10-CM

## 2022-12-26 NOTE — Therapy (Signed)
OUTPATIENT PHYSICAL THERAPY TREATMENT NOTE   Patient Name: Jay Tran MRN: [id number] DOB:October 16, 1934, 89 y.o., male Today's Date: 12/26/2022  PCP: Claris Gower   REFERRING PROVIDER: Vanetta Mulders, MD  END OF SESSION:   PT End of Session - 12/26/22 1345     Visit Number 5    Number of Visits 16    Date for PT Re-Evaluation 02/01/23    Authorization Type Medicare    Progress Note Due on Visit 10    PT Start Time 1302    PT Stop Time 1342    PT Time Calculation (min) 40 min    Activity Tolerance Patient tolerated treatment well;No increased pain;Patient limited by fatigue    Behavior During Therapy [facility] for tasks assessed/performed               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 (HCC)    Hx: of after 2013 back surgery   GERD (gastroesophageal reflux disease)    Hearing aid worn    Hx: of right ear   Hyperlipemia    Hypertension    Lumbar herniated disc    Lumbar stenosis    Macular degeneration right eye dry   PONV (postoperative nausea and vomiting)    takes iv nausea meds    Seasonal allergies    Hx: of   Stroke [facility])    Past Surgical History:  Procedure Laterality Date   BACK SURGERY  2013, x 3 with dr Ronnald Ramp 2014, 2016 and 2019   x 4, takes back injections monthly   COLONOSCOPY     Hx: of   ENDARTERECTOMY Right 02/07/2020   Procedure: ENDARTERECTOMY CAROTID;  Surgeon: Serafina Mitchell, MD;  Location: [place];  Service: Vascular;  Laterality: Right;   EYE SURGERY     bil catarcts 02/2016   HERNIA REPAIR  several yrs ago   inguinal   LUMBAR LAMINECTOMY  10/26/2012   Procedure: MICRODISCECTOMY LUMBAR LAMINECTOMY;  Surgeon: Marybelle Killings, MD;  Location: [place];  Service: Orthopedics;  Laterality: Right;  Right L4-5 Microdiscectomy   LUMBAR WOUND DEBRIDEMENT N/A 09/15/2021   Procedure: revision of thoracic  incision, Irrigation and debridement of battery pocket wound;  Surgeon: Eustace Moore, MD;  Location: [place];  Service: Neurosurgery;  Laterality: N/A;   MAXIMUM ACCESS (MAS)POSTERIOR LUMBAR INTERBODY FUSION (PLIF) 1 LEVEL N/A 04/15/2015   Procedure: Maximum Access Surgery Posterior Lumbar Interbody Fusion Lumbar two-Lumbar three extension of hardware;  Surgeon: Eustace Moore, MD;  Location: [place] [facility];  Service: Neurosurgery;  Laterality: N/A;   PATCH ANGIOPLASTY Right 02/07/2020   Procedure: PATCH ANGIOPLASTY USING [employer] BIOLOGIC PATCH;  Surgeon: Serafina Mitchell, MD;  Location: [facility];  Service: Vascular;  Laterality: Right;   ROTATOR CUFF REPAIR     right shoulder - 3 times   SPINAL CORD STIMULATOR INSERTION  08/11/2021   TONSILLECTOMY  as child   TRANSURETHRAL RESECTION OF BLADDER TUMOR N/A 02/24/2017   Procedure: TRANSURETHRAL RESECTION OF BLADDER TUMOR (TURBT);  Surgeon: Alexis Frock, MD;  Location: [facility];  Service: Urology;  Laterality: N/A;   TRANSURETHRAL RESECTION OF PROSTATE N/A 11/20/2020   Procedure: TRANSURETHRAL RESECTION OF THE PROSTATE (TURP);  Surgeon: Alexis Frock, MD;  Location: [facility];  Service: Urology;  Laterality: N/A;   Patient Active  Problem List   Diagnosis Date Noted   Unilateral primary osteoarthritis, right hip 03/30/2022   Prostatic hypertrophy 11/20/2020   Internal carotid artery stenosis 02/05/2020   Essential hypertension 02/05/2020   Benign prostatic hyperplasia with lower urinary tract symptoms 02/05/2020   GERD (gastroesophageal reflux disease) 02/05/2020   Chronic back pain 02/05/2020   Vision loss of right eye 02/04/2020   Wound drainage 08/20/2018   Chronic pain of both shoulders 02/22/2017   S/P lumbar spinal fusion 04/15/2015   HNP (herniated nucleus pulposus), lumbar 10/26/2012    Class: Diagnosis of    REFERRING DIAG:  M25.511,G89.29 (ICD-10-CM) - Chronic right shoulder pain  M25.512,G89.29 (ICD-10-CM) -  Chronic left shoulder pain    THERAPY DIAG:  Stiffness of left shoulder, not elsewhere classified  Chronic left shoulder pain  Muscle weakness (generalized)  Rationale for Evaluation and Treatment Rehabilitation  PERTINENT HISTORY: HOH (wears hearing aid on R), impaired vision (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 I don't know what's helping me the most so far in terms of my shoulder. I can't move it up as well as I did Friday, not sure why.   PAIN:  Are you having pain? Yes 5-6/10 when lifting arm   Pain location: Generalized L shoulder pain Pain description: "it hurts"  Aggravating factors: Lifting shoulder up Relieving factors: not sure, resting it    OBJECTIVE: (objective measures completed at initial evaluation unless otherwise dated)   DIAGNOSTIC FINDINGS:  MRI 12/1 L shoulder: IMPRESSION: 1. Massive full-thickness tear of the entire supraspinatus and infraspinatus tendon footprints with tendon retraction to the superior aspect of the glenoid. Moderate supraspinatus and infraspinatus muscle atrophy and intramuscular edema. 2. Moderate partial-thickness articular sided tearing of the superior subscapularis tendon insertion. This allows the long head of the biceps tendon to be perched on the anterior aspect of the lesser tuberosity as the biceps tendon enters the bicipital groove. 3. Moderate proximal long head of the biceps tendinosis with moderate to high-grade partial-thickness tearing of the tendon as it courses over  the lesser tuberosity into the bicipital groove. 4. Chronic type III [place] classification remote acromioclavicular joint  injury. 5. Mild-to-moderate glenohumeral cartilage degenerative changes.     PATIENT SURVEYS:  FOTO 61; predicted 69   COGNITION: Overall cognitive status: Within functional limits for tasks assessed                                  SENSATION: WFL   POSTURE: Humeral head anterior in [place] joint, anteriorly tilted scapula   UPPER EXTREMITY ROM:    Active/passive ROM Right eval Left eval  Shoulder flexion   A: 42 sitting P: 100 supine  Shoulder extension   A: 59 sitting P: 60 sitting  Shoulder abduction   A: 50 sitting P: 80 supine  Shoulder adduction      Shoulder internal rotation   A: T12 sitting hands behind back P: 45 deg abd in supine, 90   Shoulder external rotation   A: 25 sitting P: 45 deg abd in supine, 45   Elbow flexion      Elbow extension      Wrist flexion      Wrist extension      Wrist ulnar deviation      Wrist radial deviation      Wrist pronation      Wrist supination      (Blank rows = not tested) Pt with difficulty tolerating supine - notes that his back stimulator shocks him in this position   UPPER EXTREMITY MMT:   MMT Right eval Left eval  Shoulder flexion 4+ 2+  Shoulder extension 5 4+  Shoulder abduction 4 2+  Shoulder adduction      Shoulder internal rotation 4+ 5  Shoulder external rotation 4+ 3  Middle trapezius      Lower trapezius      Elbow flexion 5 5  Elbow extension 5 5  Wrist flexion      Wrist extension      Wrist ulnar deviation      Wrist radial deviation      Wrist pronation      Wrist supination      Grip strength (lbs)      (Blank rows = not tested)   SHOULDER SPECIAL TESTS: Impingement tests: Painful arc test: positive  Rotator cuff assessment: Drop arm test: positive , Empty can test: positive , Full can test: positive , and Infraspinatus test: negative Biceps assessment: Speed's  test: negative   JOINT MOBILITY TESTING:  L GH WFL; noted AC subluxation   PALPATION:  No tenderness to palpation             TODAY'S TREATMENT:    12/26/22  TherEx  Shoulder stretches all planes supine PROM improved especially for IR/ER Supine flexion x10 0#  Sidelying shoulder ABD AAROM x7 (pain limited) Seated ER x12 with towel pinched to ribs  Standing shoulder extensions x12 2# cues to not come into flexion due to UT compensations  AAROM flexion to 90* sitting then isometric holds x3 seconds 2x5  Attempted seated body blade with elbow bent, unable due to weakness       12/21/22  TherEx  Stretches into L shoulder flexion and ABD to tolerated ROM, stretches into ER/IR at tolerated ABD Scapular retraction red TB 1x15  Shoulder extension red TB 1x10  Backward shoulder rolls x15 mod cues for form       Manual  Tennis ball STM L anterior delt and biceps   [facility] Adult PT Treatment:  DATE: 12/16/22 Therapeutic Exercise: L shoulder ER iso x8 at [place] with towel, significant cues for form and reduced compensation B IR isos w/ theraball, 2x10, cues for form and posture B ER w/ RTB 2x10, cues for form and posture (more for isometric hold on LUE given limited ability to move through ROM) Biceps curl w/ RTB LUE 2x10 Triceps pull w/ RTB LUE x10 cues for setup and positioning, second set discontinued after 4 repetitions due to discomfort  Scapular retraction 2x10, seated, cues for form and posture Standing swiss ball flexion at table x10 cues for appropriate ROM and control                                                                                                                                       DATE: 12/07/22 See HEP below     PATIENT EDUCATION: Education details: exercise form/purpose, role of STM in reducing pain Person educated: Patient Education method: Explanation, Demonstration, and Handouts Education  comprehension: verbalized understanding, returned demonstration, and needs further education   HOME EXERCISE PROGRAM: Access Code: [email or web address] URL: [email or web address] Date: 12/07/2022 Prepared by: Estill Bamberg April Thurnell Garbe   Exercises - Seated Shoulder Flexion Towel Slide at Table Top  - 1 x daily - 7 x weekly - 2 sets - 10 reps - Seated Shoulder Abduction Towel Slide at Table Top  - 1 x daily - 7 x weekly - 2 sets - 10 reps - Seated Shoulder External Rotation AAROM with Cane and Hand in Neutral  - 1 x daily - 7 x weekly - 2 sets - 10 reps - Seated Scapular Retraction  - 1 x daily - 7 x weekly - 2 sets - 10 reps - Isometric Shoulder Flexion at [place]  - 1 x daily - 7 x weekly - 2 sets - 5 reps - 5 sec hold - Isometric Shoulder Abduction at [place]  - 1 x daily - 7 x weekly - 2 sets - 5 reps - 5 sec hold   ASSESSMENT:   CLINICAL IMPRESSION:    Mr. Windt arrives today doing OK, shoulder seems to be doing a bit better. Kept working on stretching shoulder with improved ROM noted, also worked more on AROM as well as general strengthening this session. He came with a 2 wheeled RW today, I adjusted the height of it as he is quite flexed at his hips and putting a lot of stress on injured shoulder like this. Will continue efforts. I am still concerned that he may ultimately require surgery for full shoulder recovery, he continues to want to avoid this.    OBJECTIVE IMPAIRMENTS: decreased mobility, decreased ROM, decreased strength, increased fascial restrictions, impaired UE functional use, postural dysfunction, and pain.    ACTIVITY LIMITATIONS: carrying, lifting, sleeping, dressing, reach over head, and hygiene/grooming   PARTICIPATION LIMITATIONS: meal prep, cleaning, and community activity   PERSONAL FACTORS: Age, Past/current experiences, and Time  since onset of injury/illness/exacerbation are also affecting patient's functional outcome.    REHAB POTENTIAL: Fair Multiple  comorbidities and shoulder injuries   CLINICAL DECISION MAKING: Evolving/moderate complexity   EVALUATION COMPLEXITY: Moderate     GOALS: Goals reviewed with patient? Yes   SHORT TERM GOALS: Target date: 01/04/2023     Pt will be able to perform HEP with family supervision Baseline: Limited in seeing handout due to visual impairments Goal status: INITIAL   2.  Pt will be able to improve shoulder AAROM to >/=90 deg for flexion and abd Baseline:  Goal status: INITIAL   3.  Pt will have improved shoulder AROM to >/=60 deg flexion and abd to demo improving strength Baseline:  Goal status: INITIAL   4.  Pt will report reduction of pain by >/=25% Baseline:  Goal status: INITIAL     LONG TERM GOALS: Target date: 02/01/2023     Pt will be able to maintain and advance HEP with family supervision Baseline:  Goal status: INITIAL   2.  Pt will have improved shoulder AROM to >/=90 deg flexion and abd  Baseline:  Goal status: INITIAL   3.  Pt will be able to lift at least 5# to counter height for house tasks Baseline: Unable without pain Goal status: INITIAL   4.  Pt will have improved FOTO score to >/=63 Baseline:  Goal status: INITIAL   5.  Pt will report improved pain by >/=50% with activity Baseline:  Goal status: INITIAL     PLAN:   PT FREQUENCY: 2x/week   PT DURATION: 8 weeks   PLANNED INTERVENTIONS: Therapeutic exercises, Therapeutic activity, Neuromuscular re-education, Balance training, Gait training, Patient/Family education, Self Care, Joint mobilization, Dry Needling, Electrical stimulation, Spinal mobilization, Cryotherapy, Moist heat, Taping, Vasopneumatic device, Ionotophoresis '4mg'$ /ml Dexamethasone, Manual therapy, and Re-evaluation   PLAN FOR NEXT SESSION: Assess response to HEP. Continue RTC ROM/strengthening. Progress scapular strengthening.  Deniece Ree PT DPT PN2

## 2022-12-29 ENCOUNTER — Ambulatory Visit (INDEPENDENT_AMBULATORY_CARE_PROVIDER_SITE_OTHER): Payer: Medicare Other | Admitting: Rehabilitative and Restorative Service Providers"

## 2022-12-29 ENCOUNTER — Encounter: Payer: Self-pay | Admitting: Rehabilitative and Restorative Service Providers"

## 2022-12-29 DIAGNOSIS — M25612 Stiffness of left shoulder, not elsewhere classified: Secondary | ICD-10-CM

## 2022-12-29 DIAGNOSIS — G8929 Other chronic pain: Secondary | ICD-10-CM

## 2022-12-29 DIAGNOSIS — M25512 Pain in left shoulder: Secondary | ICD-10-CM | POA: Diagnosis not present

## 2022-12-29 DIAGNOSIS — M6281 Muscle weakness (generalized): Secondary | ICD-10-CM | POA: Diagnosis not present

## 2022-12-29 NOTE — Therapy (Signed)
OUTPATIENT PHYSICAL THERAPY TREATMENT NOTE   Patient Name: Jay Tran MRN: [id number] DOB:07-05-1934, 89 y.o., male Today's Date: 12/29/2022  PCP: Claris Gower   REFERRING PROVIDER: Vanetta Mulders, MD  END OF SESSION:   PT End of Session - 12/29/22 1138     Visit Number 6    Number of Visits 16    Date for PT Re-Evaluation 02/01/23    Authorization Type Medicare    Progress Note Due on Visit 10    PT Start Time 1137    PT Stop Time 1222    PT Time Calculation (min) 45 min    Activity Tolerance Patient tolerated treatment well;No increased pain;Patient limited by fatigue    Behavior During Therapy [facility] for tasks assessed/performed                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 (HCC)    Hx: of after 2013 back surgery   GERD (gastroesophageal reflux disease)    Hearing aid worn    Hx: of right ear   Hyperlipemia    Hypertension    Lumbar herniated disc    Lumbar stenosis    Macular degeneration right eye dry   PONV (postoperative nausea and vomiting)    takes iv nausea meds    Seasonal allergies    Hx: of   Stroke [facility])    Past Surgical History:  Procedure Laterality Date   BACK SURGERY  2013, x 3 with dr Ronnald Ramp 2014, 2016 and 2019   x 4, takes back injections monthly   COLONOSCOPY     Hx: of   ENDARTERECTOMY Right 02/07/2020   Procedure: ENDARTERECTOMY CAROTID;  Surgeon: Serafina Mitchell, MD;  Location: [place];  Service: Vascular;  Laterality: Right;   EYE SURGERY     bil catarcts 02/2016   HERNIA REPAIR  several yrs ago   inguinal   LUMBAR LAMINECTOMY  10/26/2012   Procedure: MICRODISCECTOMY LUMBAR LAMINECTOMY;  Surgeon: Marybelle Killings, MD;  Location: [place];  Service: Orthopedics;  Laterality: Right;  Right L4-5 Microdiscectomy   LUMBAR WOUND DEBRIDEMENT N/A 09/15/2021   Procedure: revision of  thoracic incision, Irrigation and debridement of battery pocket wound;  Surgeon: Eustace Moore, MD;  Location: [place];  Service: Neurosurgery;  Laterality: N/A;   MAXIMUM ACCESS (MAS)POSTERIOR LUMBAR INTERBODY FUSION (PLIF) 1 LEVEL N/A 04/15/2015   Procedure: Maximum Access Surgery Posterior Lumbar Interbody Fusion Lumbar two-Lumbar three extension of hardware;  Surgeon: Eustace Moore, MD;  Location: [place] [facility];  Service: Neurosurgery;  Laterality: N/A;   PATCH ANGIOPLASTY Right 02/07/2020   Procedure: PATCH ANGIOPLASTY USING [employer] BIOLOGIC PATCH;  Surgeon: Serafina Mitchell, MD;  Location: [facility];  Service: Vascular;  Laterality: Right;   ROTATOR CUFF REPAIR     right shoulder - 3 times   SPINAL CORD STIMULATOR INSERTION  08/11/2021   TONSILLECTOMY  as child   TRANSURETHRAL RESECTION OF BLADDER TUMOR N/A 02/24/2017   Procedure: TRANSURETHRAL RESECTION OF BLADDER TUMOR (TURBT);  Surgeon: Alexis Frock, MD;  Location: [facility];  Service: Urology;  Laterality: N/A;   TRANSURETHRAL RESECTION OF PROSTATE N/A 11/20/2020   Procedure: TRANSURETHRAL RESECTION OF THE PROSTATE (TURP);  Surgeon: Alexis Frock, MD;  Location: [facility];  Service: Urology;  Laterality: N/A;   Patient  Active Problem List   Diagnosis Date Noted   Unilateral primary osteoarthritis, right hip 03/30/2022   Prostatic hypertrophy 11/20/2020   Internal carotid artery stenosis 02/05/2020   Essential hypertension 02/05/2020   Benign prostatic hyperplasia with lower urinary tract symptoms 02/05/2020   GERD (gastroesophageal reflux disease) 02/05/2020   Chronic back pain 02/05/2020   Vision loss of right eye 02/04/2020   Wound drainage 08/20/2018   Chronic pain of both shoulders 02/22/2017   S/P lumbar spinal fusion 04/15/2015   HNP (herniated nucleus pulposus), lumbar 10/26/2012    Class: Diagnosis of    REFERRING DIAG:  M25.511,G89.29 (ICD-10-CM) - Chronic right shoulder pain  M25.512,G89.29  (ICD-10-CM) - Chronic left shoulder pain    THERAPY DIAG:  Stiffness of left shoulder, not elsewhere classified  Chronic left shoulder pain  Muscle weakness (generalized)  Rationale for Evaluation and Treatment Rehabilitation  PERTINENT HISTORY: HOH (wears hearing aid on R), impaired vision (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  He uses pain patches and does'as much as (he) can" with his HEP.  PAIN:  Are you having pain? Yes 5-6/10 when lifting arm   Pain location: Generalized L shoulder pain Pain description: "it hurts"  Aggravating factors: Lifting shoulder up Relieving factors: not sure, resting it    OBJECTIVE: (objective measures completed at initial evaluation unless otherwise dated)   DIAGNOSTIC FINDINGS:  MRI 12/1 L shoulder: IMPRESSION: 1. Massive full-thickness tear of the entire supraspinatus and infraspinatus tendon footprints with tendon retraction to the superior aspect of the glenoid. Moderate supraspinatus and infraspinatus muscle atrophy and intramuscular edema. 2. Moderate partial-thickness articular sided tearing of the superior subscapularis tendon insertion. This allows the long head of the biceps tendon to be perched on the anterior aspect of the lesser tuberosity as the biceps tendon enters the bicipital groove. 3. Moderate proximal long head of the biceps tendinosis with moderate to high-grade partial-thickness tearing of the tendon as it courses over the lesser tuberosity into the bicipital groove. 4. Chronic type III  [place] classification remote acromioclavicular joint injury. 5. Mild-to-moderate glenohumeral cartilage degenerative changes.     PATIENT SURVEYS:  FOTO 61; predicted 9   COGNITION: Overall  cognitive status: Within functional limits for tasks assessed                                  SENSATION: WFL   POSTURE: Humeral head anterior in [place] joint, anteriorly tilted scapula   UPPER EXTREMITY ROM:    Active/passive ROM Right eval Left eval  Shoulder flexion   A: 42 sitting P: 100 supine  Shoulder extension   A: 59 sitting P: 60 sitting  Shoulder abduction   A: 50 sitting P: 80 supine  Shoulder adduction      Shoulder internal rotation   A: T12 sitting hands behind back P: 45 deg abd in supine, 90   Shoulder external rotation   A: 25 sitting P: 45 deg abd in supine, 45   Elbow flexion      Elbow extension      Wrist flexion      Wrist extension      Wrist ulnar deviation      Wrist radial deviation      Wrist pronation      Wrist supination      (Blank rows = not tested) Pt with difficulty tolerating supine - notes that his back stimulator shocks him in this position   UPPER EXTREMITY MMT:   MMT Right eval Left eval  Shoulder flexion 4+ 2+  Shoulder extension 5 4+  Shoulder abduction 4 2+  Shoulder adduction      Shoulder internal rotation 4+ 5  Shoulder external rotation 4+ 3  Middle trapezius      Lower trapezius      Elbow flexion 5 5  Elbow extension 5 5  Wrist flexion      Wrist extension      Wrist ulnar deviation      Wrist radial deviation      Wrist pronation      Wrist supination      Grip strength (lbs)      (Blank rows = not tested)   SHOULDER SPECIAL TESTS: Impingement tests: Painful arc test: positive  Rotator cuff assessment: Drop arm test: positive , Empty can test: positive , Full can test: positive , and Infraspinatus test: negative Biceps assessment: Speed's test: negative   JOINT MOBILITY TESTING:  L GH WFL; noted AC  subluxation   PALPATION:  No tenderness to palpation             TODAY'S TREATMENT:   12/29/2022 Shoulder blade pinches 10X 5 seconds Theraband IR 2 sets of 10 slow eccentrics Red Theraband ER 2 sets of 10 slow eccentrics Red Shoulder flexion Isometrics 10X 5 seconds Seated functional reach with Red theraband resistance 10X  Seated functional reach without resistance 10X Biceps curls standing with 4# weight, add supination Reviewed HEP with [place] and his wife   12/26/22  TherEx  Shoulder stretches all planes supine PROM improved especially for IR/ER Supine flexion x10 0#  Sidelying shoulder ABD AAROM x7 (pain limited) Seated ER x12 with towel pinched to ribs  Standing shoulder extensions x12 2# cues to not come into flexion due to UT compensations  AAROM flexion to 90* sitting then isometric holds x3 seconds 2x5  Attempted seated body blade with elbow bent, unable due to weakness    12/21/22  TherEx  Stretches into L shoulder flexion and ABD to tolerated ROM, stretches into ER/IR at tolerated ABD Scapular retraction red TB 1x15  Shoulder extension red TB  1x10  Backward shoulder rolls x15 mod cues for form       Manual  Tennis ball STM L anterior delt and biceps   [facility] Adult PT Treatment:                                                DATE: 12/16/22 Therapeutic Exercise: L shoulder ER iso x8 at [place] with towel, significant cues for form and reduced compensation B IR isos w/ theraball, 2x10, cues for form and posture B ER w/ RTB 2x10, cues for form and posture (more for isometric hold on LUE given limited ability to move through ROM) Biceps curl w/ RTB LUE 2x10 Triceps pull w/ RTB LUE x10 cues for setup and positioning, second set discontinued after 4 repetitions due to discomfort  Scapular retraction 2x10, seated, cues for form and posture Standing swiss ball flexion at table x10 cues for appropriate ROM and control                                                                                                                                        PATIENT EDUCATION: Education details: exercise form/purpose, role of STM in reducing pain Person educated: Patient Education method: Explanation, Demonstration, and Handouts Education comprehension: verbalized understanding, returned demonstration, and needs further education   HOME EXERCISE PROGRAM: Access Code: [email or web address] URL: [email or web address] Date: 12/29/2022 Prepared by: Vista Mink  Exercises - Seated Shoulder Flexion Towel Slide at Table Top  - 1 x daily - 7 x weekly - 2 sets - 10 reps - Seated Shoulder Abduction Towel Slide at Table Top  - 1 x daily - 7 x weekly - 2 sets - 10 reps - Seated Shoulder External Rotation AAROM with Cane and Hand in Neutral  - 1 x daily - 7 x weekly - 2 sets - 10 reps - Seated Scapular Retraction  - 5 x daily - 7 x weekly - 1 sets - 5 reps - Isometric Shoulder Flexion at [place]  - 1 x daily - 7 x weekly - 1 sets - 10 reps - 5 sec hold - Isometric Shoulder Abduction at [place]  - 1 x daily - 7 x weekly - 2 sets - 5 reps - 5 sec hold - Supine Scapular Protraction in Flexion with Dumbbells  - 2 x daily - 7 x weekly - 2 sets - 10 reps - 3 seconds hold - Shoulder External Rotation with Anchored Resistance  - 1 x daily - 7 x weekly - 2 sets - 10 reps - 3 hold - Shoulder Internal Rotation with Resistance  - 1 x daily - 7 x weekly - 2 sets - 10 reps - 3 hold - Standing Bicep Curl with Dumbbells  - 1 x daily - 7 x  weekly - 2 sets - 10 reps    ASSESSMENT:   CLINICAL IMPRESSION: [place] is doing as much as he can with a very torn up left shoulder.  We discussed that our goal with PT is to avoid TSA.  If he can function at an acceptable level with acceptable pain, we will consider that a success.  Finding beneficial activities that he can do that are comfortable has been the focus thus far with Jay Tran's supervised PT.   OBJECTIVE IMPAIRMENTS: decreased mobility,  decreased ROM, decreased strength, increased fascial restrictions, impaired UE functional use, postural dysfunction, and pain.    ACTIVITY LIMITATIONS: carrying, lifting, sleeping, dressing, reach over head, and hygiene/grooming   PARTICIPATION LIMITATIONS: meal prep, cleaning, and community activity   PERSONAL FACTORS: Age, Past/current experiences, and Time since onset of injury/illness/exacerbation are also affecting patient's functional outcome.    REHAB POTENTIAL: Fair Multiple comorbidities and shoulder injuries   CLINICAL DECISION MAKING: Evolving/moderate complexity   EVALUATION COMPLEXITY: Moderate     GOALS: Goals reviewed with patient? Yes   SHORT TERM GOALS: Target date: 01/04/2023     Pt will be able to perform HEP with family supervision Baseline: Limited in seeing handout due to visual impairments Goal status: On Going 12/29/2022   2.  Pt will be able to improve shoulder AAROM to >/=90 deg for flexion and abd Baseline:  Goal status: On Going 12/29/2022   3.  Pt will have improved shoulder AROM to >/=60 deg flexion and abd to demo improving strength Baseline:  Goal status: On Going 12/29/2022   4.  Pt will report reduction of pain by >/=25% Baseline:  Goal status: On Going 12/29/2022     LONG TERM GOALS: Target date: 02/01/2023     Pt will be able to maintain and advance HEP with family supervision Baseline:  Goal status: INITIAL   2.  Pt will have improved shoulder AROM to >/=90 deg flexion and abd  Baseline:  Goal status: INITIAL   3.  Pt will be able to lift at least 5# to counter height for house tasks Baseline: Unable without pain Goal status: INITIAL   4.  Pt will have improved FOTO score to >/=63 Baseline:  Goal status: INITIAL   5.  Pt will report improved pain by >/=50% with activity Baseline:  Goal status: INITIAL     PLAN:   PT FREQUENCY: 2x/week   PT DURATION: 8 weeks   PLANNED INTERVENTIONS: Therapeutic exercises, Therapeutic  activity, Neuromuscular re-education, Balance training, Gait training, Patient/Family education, Self Care, Joint mobilization, Dry Needling, Electrical stimulation, Spinal mobilization, Cryotherapy, Moist heat, Taping, Vasopneumatic device, Ionotophoresis '4mg'$ /ml Dexamethasone, Manual therapy, and Re-evaluation   PLAN FOR NEXT SESSION: Assess response to HEP.  Continue RTC and scapular strengthening.  Progress AROM as able.  Farley Ly PT, MPT

## 2023-01-02 ENCOUNTER — Encounter: Payer: Self-pay | Admitting: Physical Therapy

## 2023-01-02 ENCOUNTER — Ambulatory Visit (INDEPENDENT_AMBULATORY_CARE_PROVIDER_SITE_OTHER): Payer: Medicare Other | Admitting: Physical Therapy

## 2023-01-02 DIAGNOSIS — M6281 Muscle weakness (generalized): Secondary | ICD-10-CM | POA: Diagnosis not present

## 2023-01-02 DIAGNOSIS — G8929 Other chronic pain: Secondary | ICD-10-CM | POA: Diagnosis not present

## 2023-01-02 DIAGNOSIS — M25612 Stiffness of left shoulder, not elsewhere classified: Secondary | ICD-10-CM | POA: Diagnosis not present

## 2023-01-02 DIAGNOSIS — M25512 Pain in left shoulder: Secondary | ICD-10-CM | POA: Diagnosis not present

## 2023-01-02 NOTE — Therapy (Addendum)
OUTPATIENT PHYSICAL THERAPY TREATMENT NOTE /DISCHARGE   Patient Name: Jay Tran MRN: [id number] DOB:[birth year], 89 y.o., male Today's Date: 01/02/2023  PCP: Claris Gower   REFERRING PROVIDER: Vanetta Mulders, MD  END OF SESSION:   PT End of Session - 01/02/23 1352     Visit Number 7    Number of Visits 16    Date for PT Re-Evaluation 02/01/23    Authorization Type Medicare    Progress Note Due on Visit 10    PT Start Time 3500    PT Stop Time 1426    PT Time Calculation (min) 39 min    Activity Tolerance Patient tolerated treatment well;No increased pain;Patient limited by fatigue    Behavior During Therapy [facility] for tasks assessed/performed                 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 (HCC)    Hx: of after 2013 back surgery   GERD (gastroesophageal reflux disease)    Hearing aid worn    Hx: of right ear   Hyperlipemia    Hypertension    Lumbar herniated disc    Lumbar stenosis    Macular degeneration right eye dry   PONV (postoperative nausea and vomiting)    takes iv nausea meds    Seasonal allergies    Hx: of   Stroke [facility])    Past Surgical History:  Procedure Laterality Date   BACK SURGERY  2013, x 3 with dr Ronnald Ramp 2014, 2016 and 2019   x 4, takes back injections monthly   COLONOSCOPY     Hx: of   ENDARTERECTOMY Right 02/07/2020   Procedure: ENDARTERECTOMY CAROTID;  Surgeon: Serafina Mitchell, MD;  Location: [place];  Service: Vascular;  Laterality: Right;   EYE SURGERY     bil catarcts 02/2016   HERNIA REPAIR  several yrs ago   inguinal   LUMBAR LAMINECTOMY  10/26/2012   Procedure: MICRODISCECTOMY LUMBAR LAMINECTOMY;  Surgeon: Marybelle Killings, MD;  Location: [place];  Service: Orthopedics;  Laterality: Right;  Right L4-5 Microdiscectomy   LUMBAR WOUND DEBRIDEMENT N/A 09/15/2021   Procedure:  revision of thoracic incision, Irrigation and debridement of battery pocket wound;  Surgeon: Eustace Moore, MD;  Location: [place];  Service: Neurosurgery;  Laterality: N/A;   MAXIMUM ACCESS (MAS)POSTERIOR LUMBAR INTERBODY FUSION (PLIF) 1 LEVEL N/A 04/15/2015   Procedure: Maximum Access Surgery Posterior Lumbar Interbody Fusion Lumbar two-Lumbar three extension of hardware;  Surgeon: Eustace Moore, MD;  Location: [place] [facility];  Service: Neurosurgery;  Laterality: N/A;   PATCH ANGIOPLASTY Right 02/07/2020   Procedure: PATCH ANGIOPLASTY USING [employer] BIOLOGIC PATCH;  Surgeon: Serafina Mitchell, MD;  Location: [facility];  Service: Vascular;  Laterality: Right;   ROTATOR CUFF REPAIR     right shoulder - 3 times   SPINAL CORD STIMULATOR INSERTION  08/11/2021   TONSILLECTOMY  as child   TRANSURETHRAL RESECTION OF BLADDER TUMOR N/A 02/24/2017   Procedure: TRANSURETHRAL RESECTION OF BLADDER TUMOR (TURBT);  Surgeon: Alexis Frock, MD;  Location: [facility];  Service: Urology;  Laterality: N/A;   TRANSURETHRAL RESECTION OF PROSTATE N/A 11/20/2020   Procedure: TRANSURETHRAL RESECTION OF THE PROSTATE (TURP);  Surgeon: Alexis Frock, MD;  Location[facility];  Service: Urology;  Laterality: N/A;  Patient Active Problem List   Diagnosis Date Noted   Unilateral primary osteoarthritis, right hip 03/30/2022   Prostatic hypertrophy 11/20/2020   Internal carotid artery stenosis 02/05/2020   Essential hypertension 02/05/2020   Benign prostatic hyperplasia with lower urinary tract symptoms 02/05/2020   GERD (gastroesophageal reflux disease) 02/05/2020   Chronic back pain 02/05/2020   Vision loss of right eye 02/04/2020   Wound drainage 08/20/2018   Chronic pain of both shoulders 02/22/2017   S/P lumbar spinal fusion 04/15/2015   HNP (herniated nucleus pulposus), lumbar 10/26/2012    Class: Diagnosis of    REFERRING DIAG:  M25.511,G89.29 (ICD-10-CM) - Chronic right shoulder pain   M25.512,G89.29 (ICD-10-CM) - Chronic left shoulder pain    THERAPY DIAG:  Stiffness of left shoulder, not elsewhere classified  Muscle weakness (generalized)  Chronic left shoulder pain  Rationale for Evaluation and Treatment Rehabilitation  PERTINENT HISTORY: HOH (wears hearing aid on R), impaired vision (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 Rob gave me a good working out. I'm doing part of the exercises, I really do that more than the exercises you all give me. I really think falling on the water hose is what tore up my shoulder.   PAIN:  Are you having pain? Yes 7-8/10 when lifting arm   Pain location: Generalized L shoulder pain Pain description: "it hurts"  Aggravating factors: Lifting shoulder up Relieving factors: not sure, resting it, bike    OBJECTIVE: (objective measures completed at initial evaluation unless otherwise dated)   DIAGNOSTIC FINDINGS:  MRI 12/1 L shoulder: IMPRESSION: 1. Massive full-thickness tear of the entire supraspinatus and infraspinatus tendon footprints with tendon retraction to the superior aspect of the glenoid. Moderate supraspinatus and infraspinatus muscle atrophy and intramuscular edema. 2. Moderate partial-thickness articular sided tearing of the superior subscapularis tendon insertion. This allows the long head of the biceps tendon to be perched on the anterior aspect of the lesser tuberosity as the biceps tendon enters the bicipital groove. 3. Moderate proximal long  head of the biceps tendinosis with moderate to high-grade partial-thickness tearing of the tendon as it courses over the  lesser tuberosity into the bicipital groove. 4. Chronic type III [place] classification remote acromioclavicular joint injury. 5. Mild-to-moderate glenohumeral cartilage degenerative changes.     PATIENT SURVEYS:  FOTO 61; predicted 5   COGNITION: Overall cognitive status: Within functional limits for tasks assessed                                  SENSATION: WFL   POSTURE: Humeral head anterior in [place] joint, anteriorly tilted scapula   UPPER EXTREMITY ROM:    Active/passive ROM Right eval Left eval  Shoulder flexion   A: 42 sitting P: 100 supine  Shoulder extension   A: 59 sitting P: 60 sitting  Shoulder abduction   A: 50 sitting P: 80 supine  Shoulder adduction      Shoulder internal rotation   A: T12 sitting hands behind back P: 45 deg abd in supine, 90   Shoulder external rotation   A: 25 sitting P: 45 deg abd in supine, 45   Elbow flexion      Elbow extension      Wrist flexion      Wrist extension      Wrist ulnar deviation      Wrist radial deviation      Wrist pronation      Wrist supination      (Blank rows = not tested) Pt with difficulty tolerating supine - notes that his back stimulator shocks him in this position   UPPER EXTREMITY MMT:   MMT Right eval Left eval  Shoulder flexion 4+ 2+  Shoulder extension 5 4+  Shoulder abduction 4 2+  Shoulder adduction      Shoulder internal rotation 4+ 5  Shoulder external rotation 4+ 3  Middle trapezius      Lower trapezius      Elbow flexion 5 5  Elbow extension 5 5  Wrist flexion      Wrist extension      Wrist ulnar deviation      Wrist radial deviation      Wrist pronation      Wrist supination      Grip strength (lbs)      (Blank rows = not tested)   SHOULDER SPECIAL TESTS: Impingement tests: Painful arc test: positive  Rotator cuff assessment: Drop arm test: positive ,  Empty can test: positive , Full can test: positive , and Infraspinatus test: negative Biceps assessment: Speed's test: negative   JOINT MOBILITY TESTING:  L GH WFL; noted AC subluxation   PALPATION:  No tenderness to palpation             TODAY'S TREATMENT:   01/02/23  TherEx  Supine shoulder flexion 0# x10 AAROM Serratus punches x10 CW and CCW circles at 90* flexion in supine x10 each  Seated flexion/ABD/extension x10 each direction- multimodal cues to reduce upper trap compensations even with isometrics Bicep curls 4# x10 supinated, beer drinkers curl x10, pronated curls x10 0# Functional reaches forward x10 seated  Functional reaches at scaption angle x10 seated      12/29/2022 Shoulder blade pinches 10X 5 seconds Theraband IR 2 sets of 10 slow eccentrics Red Theraband ER 2 sets of 10 slow eccentrics Red Shoulder flexion Isometrics 10X 5 seconds Seated functional reach with Red theraband resistance 10X  Seated functional reach without resistance 10X Biceps curls standing with 4# weight, add supination Reviewed HEP with Kashus and his wife  12/26/22  TherEx  Shoulder stretches all planes supine PROM improved especially for IR/ER Supine flexion x10 0#  Sidelying shoulder ABD AAROM x7 (pain limited) Seated ER x12 with towel pinched to ribs  Standing shoulder extensions x12 2# cues to not come into flexion due to UT compensations  AAROM flexion to 90* sitting then isometric holds x3 seconds 2x5  Attempted seated body blade with elbow bent, unable due to weakness    12/21/22  TherEx  Stretches into L shoulder flexion and ABD to tolerated ROM, stretches into ER/IR at tolerated ABD Scapular retraction red TB 1x15  Shoulder extension red TB 1x10  Backward shoulder rolls x15 mod cues for form       Manual  Tennis ball STM L anterior delt and biceps   [facility] Adult PT Treatment:                                                DATE: 12/16/22 Therapeutic  Exercise: L shoulder ER iso x8 at [place] with towel, significant cues for form and reduced compensation B IR isos w/ theraball, 2x10, cues for form and posture B ER w/ RTB 2x10, cues for form and posture (more for isometric hold on LUE given limited ability to move through ROM) Biceps curl w/ RTB LUE 2x10 Triceps pull w/ RTB LUE x10 cues for setup and positioning, second set discontinued after 4 repetitions due to discomfort  Scapular retraction 2x10, seated, cues for form and posture Standing swiss ball flexion at table x10 cues for appropriate ROM and control                                                                                                                                       PATIENT EDUCATION: Education details: exercise form/purpose, role of STM in reducing pain Person educated: Patient Education method: Explanation, Demonstration, and Handouts Education comprehension: verbalized understanding, returned demonstration, and needs further education   HOME EXERCISE PROGRAM: Access Code: [email or web address] URL: [email or web address] Date: 12/29/2022 Prepared by: Vista Mink  Exercises - Seated Shoulder Flexion Towel Slide at Table Top  - 1 x daily - 7 x weekly - 2 sets - 10 reps - Seated Shoulder Abduction Towel Slide at Table Top  - 1 x daily - 7 x weekly - 2 sets - 10 reps - Seated Shoulder External Rotation AAROM with Cane and Hand in Neutral  - 1 x daily - 7 x weekly - 2 sets - 10 reps - Seated Scapular Retraction  - 5 x daily - 7 x weekly - 1 sets - 5 reps - Isometric Shoulder Flexion at [place]  - 1 x daily - 7 x weekly - 1 sets - 10 reps - 5 sec hold - Isometric Shoulder Abduction at [place]  -  1 x daily - 7 x weekly - 2 sets - 5 reps - 5 sec hold - Supine Scapular Protraction in Flexion with Dumbbells  - 2 x daily - 7 x weekly - 2 sets - 10 reps - 3 seconds hold - Shoulder External Rotation with Anchored Resistance  - 1 x daily - 7 x weekly - 2 sets - 10 reps - 3  hold - Shoulder Internal Rotation with Resistance  - 1 x daily - 7 x weekly - 2 sets - 10 reps - 3 hold - Standing Bicep Curl with Dumbbells  - 1 x daily - 7 x weekly - 2 sets - 10 reps    ASSESSMENT:   CLINICAL IMPRESSION:   Jay Tran arrives today, still seems to be having considering shoulder issues on the left .Kept working on functional strength as able and tolerated, ROM is generally equal to that of the right at this point- worked primarily on functional strengthening as able today, his hearing aide was not working great today so it took a lot of extra time for me to explain all exercises to him/had to use heavy multimodal cues today. Lots of exercises were limited by pain today. I am still concerned that he will ultimately need surgery, will continue efforts. May benefit from brief re-assessment within the next couple of sessions to assess progress and if he is objectively benefiting from therapy.    OBJECTIVE IMPAIRMENTS: decreased mobility, decreased ROM, decreased strength, increased fascial restrictions, impaired UE functional use, postural dysfunction, and pain.    ACTIVITY LIMITATIONS: carrying, lifting, sleeping, dressing, reach over head, and hygiene/grooming   PARTICIPATION LIMITATIONS: meal prep, cleaning, and community activity   PERSONAL FACTORS: Age, Past/current experiences, and Time since onset of injury/illness/exacerbation are also affecting patient's functional outcome.    REHAB POTENTIAL: Fair Multiple comorbidities and shoulder injuries   CLINICAL DECISION MAKING: Evolving/moderate complexity   EVALUATION COMPLEXITY: Moderate     GOALS: Goals reviewed with patient? Yes   SHORT TERM GOALS: Target date: 01/04/2023     Pt will be able to perform HEP with family supervision Baseline: Limited in seeing handout due to visual impairments Goal status: On Going 12/29/2022   2.  Pt will be able to improve shoulder AAROM to >/=90 deg for flexion and abd Baseline:   Goal status: On Going 12/29/2022   3.  Pt will have improved shoulder AROM to >/=60 deg flexion and abd to demo improving strength Baseline:  Goal status: On Going 12/29/2022   4.  Pt will report reduction of pain by >/=25% Baseline:  Goal status: On Going 12/29/2022     LONG TERM GOALS: Target date: 02/01/2023     Pt will be able to maintain and advance HEP with family supervision Baseline:  Goal status: INITIAL   2.  Pt will have improved shoulder AROM to >/=90 deg flexion and abd  Baseline:  Goal status: INITIAL   3.  Pt will be able to lift at least 5# to counter height for house tasks Baseline: Unable without pain Goal status: INITIAL   4.  Pt will have improved FOTO score to >/=63 Baseline:  Goal status: INITIAL   5.  Pt will report improved pain by >/=50% with activity Baseline:  Goal status: INITIAL     PLAN:   PT FREQUENCY: 2x/week   PT DURATION: 8 weeks   PLANNED INTERVENTIONS: Therapeutic exercises, Therapeutic activity, Neuromuscular re-education, Balance training, Gait training, Patient/Family education, Self Care, Joint mobilization, Dry Needling,  Electrical stimulation, Spinal mobilization, Cryotherapy, Moist heat, Taping, Vasopneumatic device, Ionotophoresis '4mg'$ /ml Dexamethasone, Manual therapy, and Re-evaluation   PLAN FOR NEXT SESSION: Assess response to HEP.  Continue RTC and scapular strengthening.  Progress AROM as able.  Deniece Ree PT DPT PN2    PHYSICAL THERAPY DISCHARGE SUMMARY  Visits from Start of Care: 7  Current functional level related to goals / functional outcomes: See note   Remaining deficits: See note   Education / Equipment: HEP  Patient goals were partially met. Patient is being discharged due to  transitioning to balance related activity.  Scot Jun, PT, DPT, OCS, ATC 01/23/23  3:09 PM

## 2023-01-04 ENCOUNTER — Ambulatory Visit (INDEPENDENT_AMBULATORY_CARE_PROVIDER_SITE_OTHER): Payer: Medicare Other | Admitting: Orthopaedic Surgery

## 2023-01-04 DIAGNOSIS — M25511 Pain in right shoulder: Secondary | ICD-10-CM | POA: Diagnosis not present

## 2023-01-04 DIAGNOSIS — G8929 Other chronic pain: Secondary | ICD-10-CM

## 2023-01-04 DIAGNOSIS — M25512 Pain in left shoulder: Secondary | ICD-10-CM | POA: Diagnosis not present

## 2023-01-04 MED ORDER — LIDOCAINE HCL 1 % IJ SOLN
4.0000 mL | INTRAMUSCULAR | Status: AC | PRN
Start: 2023-01-04 — End: 2023-01-04
  Administered 2023-01-04: 4 mL

## 2023-01-04 MED ORDER — LIDOCAINE HCL 1 % IJ SOLN
4.0000 mL | INTRAMUSCULAR | Status: AC | PRN
  Administered 2023-01-04: 4 mL

## 2023-01-04 MED ORDER — TRIAMCINOLONE ACETONIDE 40 MG/ML IJ SUSP
80.0000 mg | INTRAMUSCULAR | Status: AC | PRN
Start: 2023-01-04 — End: 2023-01-04
  Administered 2023-01-04: 80 mg via INTRA_ARTICULAR

## 2023-01-04 MED ORDER — TRIAMCINOLONE ACETONIDE 40 MG/ML IJ SUSP
80.0000 mg | INTRAMUSCULAR | Status: AC | PRN
  Administered 2023-01-04: 80 mg via INTRA_ARTICULAR

## 2023-01-04 NOTE — Progress Notes (Signed)
Chief Complaint: Bilateral shoulder pain     History of Present Illness:   01/04/2023: Today for follow-up of his shoulders.  He has unfortunately plateaued in physical therapy.  He is still having a hard time laying on the direct left side with limited motion.  States that the right shoulder is more painful at baseline.  Particular with overhead motion.  Jay Tran is a 89 y.o. male presents today with right worse than left shoulder pain.  He has previously been treated and seen by Dr. Ninfa Linden.  He is status post right shoulder rotator cuff repair which subsequently developed osteoarthritis.  He did have an injection with Dr. Ninfa Linden which did help significantly on this right shoulder.  He is endorsing limited function and some pain about the left shoulder for which Dr. Ninfa Linden and recently ordered an MRI.  He is here today for MRI discussion.  Overall he is quite hesitant to have any type of surgical intervention at this time given his history of infection on the contralateral side following rotator cuff repair.  He is here today for further discussion.  Overall his left shoulder feels much better and improved today.  He does have limited overhead motion although the pain has subsided quite significantly.    Surgical History:   As above  PMH/PSH/Family History/Social History/Meds/Allergies:    Past Medical History:  Diagnosis Date  . Arthritis   . BPH (benign prostatic hypertrophy)   . Carotid stenosis sx done feb 2021   right   . 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 (HCC)    Hx: of after 2013 back surgery  . GERD (gastroesophageal reflux disease)   . Hearing aid worn    Hx: of right ear  . Hyperlipemia   . Hypertension   . Lumbar herniated disc   . Lumbar stenosis   . Macular degeneration right eye dry  . PONV (postoperative nausea and vomiting)    takes iv nausea meds   . Seasonal allergies    Hx: of  . Stroke [facility])    Past  Surgical History:  Procedure Laterality Date  . BACK SURGERY  2013, x 3 with dr Ronnald Ramp 2014, 2016 and 2019   x 4, takes back injections monthly  . COLONOSCOPY     Hx: of  . ENDARTERECTOMY Right 02/07/2020   Procedure: ENDARTERECTOMY CAROTID;  Surgeon: Serafina Mitchell, MD;  Location: [place];  Service: Vascular;  Laterality: Right;  . EYE SURGERY     bil catarcts 02/2016  . HERNIA REPAIR  several yrs ago   inguinal  . LUMBAR LAMINECTOMY  10/26/2012   Procedure: MICRODISCECTOMY LUMBAR LAMINECTOMY;  Surgeon: Marybelle Killings, MD;  Location: [place];  Service: Orthopedics;  Laterality: Right;  Right L4-5 Microdiscectomy  . LUMBAR WOUND DEBRIDEMENT N/A 09/15/2021   Procedure: revision of thoracic incision, Irrigation and debridement of battery pocket wound;  Surgeon: Eustace Moore, MD;  Location: [place];  Service: Neurosurgery;  Laterality: N/A;  . MAXIMUM ACCESS (MAS)POSTERIOR LUMBAR INTERBODY FUSION (PLIF) 1 LEVEL N/A 04/15/2015   Procedure: Maximum Access Surgery Posterior Lumbar Interbody Fusion Lumbar two-Lumbar three extension of hardware;  Surgeon: Eustace Moore, MD;  Location: [place] NEURO ORS;  Service: Neurosurgery;  Laterality: N/A;  . PATCH ANGIOPLASTY Right 02/07/2020   Procedure: PATCH ANGIOPLASTY  [place];  Surgeon: Serafina Mitchell, MD;  Location: [facility] OR;  Service: Vascular;  Laterality: Right;  . ROTATOR CUFF REPAIR     right shoulder - 3 times  . SPINAL CORD STIMULATOR INSERTION  08/11/2021  . TONSILLECTOMY  as child  . TRANSURETHRAL RESECTION OF BLADDER TUMOR N/A 02/24/2017   Procedure: TRANSURETHRAL RESECTION OF BLADDER TUMOR (TURBT);  Surgeon: Alexis Frock, MD;  Location: [facility];  Service: Urology;  Laterality: N/A;  . TRANSURETHRAL RESECTION OF PROSTATE N/A 11/20/2020   Procedure: TRANSURETHRAL RESECTION OF THE PROSTATE (TURP);  Surgeon: Alexis Frock, MD;  Location: [facility];  Service: Urology;  Laterality: N/A;   Social History    Socioeconomic History  . Marital status: Married    Spouse name: [name]  . Number of children: Not on file  . Years of education: Not on file  . Highest education level: Not on file  Occupational History  . Not on file  Tobacco Use  . Smoking status: Former    Packs/day: 3.00    Years: 5.00    Total pack years: 15.00    Types: Cigarettes    Quit date: 11/12/1963    Years since quitting: 59.1  . Smokeless tobacco: Never  Vaping Use  . Vaping Use: Never used  Substance and Sexual Activity  . Alcohol use: No  . Drug use: No  . Sexual activity: Yes  Other Topics Concern  . Not on file  Social History Narrative   Lives with wife   Social Determinants of Health   Financial Resource Strain: Not on file  Food Insecurity: Not on file  Transportation Needs: Not on file  Physical Activity: Not on file  Stress: Not on file  Social Connections: Not on file   Family History  Problem Relation Age of Onset  . Stroke Mother   . Cancer - Prostate Father    Allergies  Allergen Reactions  . Codeine Itching and Nausea And Vomiting  . Other Nausea Only    Anesthesia  . Tramadol Nausea Only   Current Outpatient Medications  Medication Sig Dispense Refill  . acetaminophen (TYLENOL) 500 MG tablet Take 500-1,000 mg by mouth every 6 (six) hours as needed for mild pain or headache (ONLY WHEN NOT TAKING ALEVE).    [name] aspirin EC 81 MG tablet Take 81 mg by mouth in the morning. Swallow whole.    [name] atorvastatin (LIPITOR) 40 MG tablet TAKE 1 TABLET BY MOUTH ONCE DAILY AT  6  PM (Patient taking differently: Take 40 mg by mouth at bedtime.) 90 tablet 3  . diphenhydrAMINE (BENADRYL) 25 MG tablet Take 25 mg by mouth in the morning and at bedtime.    . finasteride (PROSCAR) 5 MG tablet Take 5 mg by mouth at bedtime.     . hydrochlorothiazide (HYDRODIURIL) 50 MG tablet Take 50 mg by mouth at bedtime.     . Liniments (SALONPAS PAIN RELIEF PATCH EX) Place 1 patch onto the skin daily as needed (to  affected area for pain).     [name] lisinopril (PRINIVIL,ZESTRIL) 20 MG tablet Take 20 mg by mouth at bedtime.     . Multiple Vitamins-Minerals (PRESERVISION AREDS 2 PO) Take 1 capsule by mouth 2 (two) times daily.     . naproxen sodium (ALEVE) 220 MG tablet Take 220-440 mg by mouth 2 (two) times daily as needed (pain.).    [name] Omega-3 Fatty Acids (FISH OIL) 1200 MG CAPS Take 1,200 mg by mouth  daily with breakfast.     . ondansetron (ZOFRAN) 8 MG tablet Take 8 mg by mouth every 8 (eight) hours as needed for nausea or vomiting.    [name] SYSTANE 0.4-0.3 % SOLN Place 1 drop into both eyes 3 (three) times daily as needed (for dryness).    . Tamsulosin HCl (FLOMAX) 0.4 MG CAPS Take 0.4 mg by mouth at bedtime.     [name] tiZANidine (ZANAFLEX) 4 MG tablet Take 1 tablet (4 mg total) by mouth every 8 (eight) hours as needed for muscle spasms. (Patient taking differently: Take 4 mg by mouth at bedtime.) 60 tablet 1  . traMADol (ULTRAM) 50 MG tablet Take 1-2 tablets (50-100 mg total) by mouth every 6 (six) hours as needed. 30 tablet 0  . Turmeric (QC TUMERIC COMPLEX PO) Take 500 mg by mouth daily with breakfast.     No current facility-administered medications for this visit.   No results found.  Review of Systems:   A ROS was performed including pertinent positives and negatives as documented in the HPI.  Physical Exam :   Constitutional: NAD and appears stated age Neurological: Alert and oriented Psych: Appropriate affect and cooperative There were no vitals taken for this visit.   Comprehensive Musculoskeletal Exam:    Tenderness to palpation about lateral shoulder on the left.  Forward elevation is to approximately 80 degrees external rotation at the side to 20 degrees.  Internal rotation is to front pocket.  On the right he has forward elevation of 125 degrees with external rotation at the side of 20 degrees.  Internal rotation is to L5.  He has 4 out of 5 strength bilaterally although worse on the left  compared to the right  Imaging:   Xray (3 views left shoulder, 3 views right shoulder): Status post right shoulder rotator cuff repair with anchors in place and subsequent rotator cuff arthropathy following, left shoulder with superior migration of the humerus consistent with early rotator cuff arthropathy findings  MRI (left shoulder): There is full-thickness rotator cuff tear with retraction past the glenoid and significant atrophy of the supraspinatus and infraspinatus tendons  I personally reviewed and interpreted the radiographs.   Assessment:   89 y.o. male right-hand-dominant male with left shoulder rotator cuff arthropathy in the setting of a massive rotator cuff tear with retraction.  Given this I discussed his treatment options.  Overall his left shoulder pain is much improved at today's visit and is actually quite minimal.  His overhead motion is limited although I would like to enroll him in physical therapy to hopefully optimize the shoulder.  We did discuss treatment options.  Given his history of significant infection following a rotator cuff repair given the fact that I do not necessarily believe that his rotator cuff is repairable I do believe he may be a candidate for balloon arthroplasty with the Stryker in space balloon.  We did discuss that this would be a surgical option should he ultimately failed conservative management with physical therapy.  At today's visit he would like to consider bilateral ultrasound-guided shoulder injections  Plan :    -Bilateral ultrasound-guided shoulder injection performed today after verbal consent today, he will consider left shoulder in space balloon.    Procedure Note  Patient: Jay Tran             Date of Birth: 10-01-1934           MRN: [id number]  Visit Date: 01/04/2023  Procedures: Visit Diagnoses: No diagnosis found.  Large Joint Inj: R subacromial bursa on 01/04/2023 1:57 PM Indications: pain Details: 22 G 1.5  in needle, ultrasound-guided anterior approach  Arthrogram: No  Medications: 4 mL lidocaine 1 %; 80 mg triamcinolone acetonide 40 MG/ML Outcome: tolerated well, no immediate complications Procedure, treatment alternatives, risks and benefits explained, specific risks discussed. Consent was given by the patient. Immediately prior to procedure a time out was called to verify the correct patient, procedure, equipment, support staff and site/side marked as required. Patient was prepped and draped in the usual sterile fashion.    Large Joint Inj: L subacromial bursa on 01/04/2023 1:58 PM Indications: pain Details: 22 G 1.5 in needle, ultrasound-guided anterior approach  Arthrogram: No  Medications: 4 mL lidocaine 1 %; 80 mg triamcinolone acetonide 40 MG/ML Outcome: tolerated well, no immediate complications Procedure, treatment alternatives, risks and benefits explained, specific risks discussed. Consent was given by the patient. Immediately prior to procedure a time out was called to verify the correct patient, procedure, equipment, support staff and site/side marked as required. Patient was prepped and draped in the usual sterile fashion.          I personally saw and evaluated the patient, and participated in the management and treatment plan.  Vanetta Mulders, MD Attending Physician, Orthopedic Surgery  This document was dictated using Dragon voice recognition software. A reasonable attempt at proof reading has been made to minimize errors.

## 2023-01-05 ENCOUNTER — Encounter: Payer: Medicare Other | Admitting: Rehabilitative and Restorative Service Providers"

## 2023-01-09 ENCOUNTER — Telehealth: Payer: Self-pay | Admitting: Orthopaedic Surgery

## 2023-01-09 ENCOUNTER — Other Ambulatory Visit (HOSPITAL_BASED_OUTPATIENT_CLINIC_OR_DEPARTMENT_OTHER): Payer: Self-pay | Admitting: Orthopaedic Surgery

## 2023-01-09 ENCOUNTER — Encounter: Payer: Medicare Other | Admitting: Physical Therapy

## 2023-01-09 DIAGNOSIS — R2689 Other abnormalities of gait and mobility: Secondary | ICD-10-CM

## 2023-01-09 NOTE — Telephone Encounter (Signed)
Attempted CB x2 to confirm message was received

## 2023-01-09 NOTE — Telephone Encounter (Signed)
PT for balance // fall prevention ordered to [facility]

## 2023-01-09 NOTE — Telephone Encounter (Signed)
LMOM stating Dr. Sammuel Hines is okay to d/c PT for shoulders, but can order PT for balance training if desired.

## 2023-01-09 NOTE — Telephone Encounter (Signed)
Patients daughter advises that patient was told not to do P/T for shoulders and was advised that he was going to be starting balance P/T. And now she said patient is confused because he was told everything was canceled. Please call.(Debbie [phone]

## 2023-01-11 ENCOUNTER — Encounter (INDEPENDENT_AMBULATORY_CARE_PROVIDER_SITE_OTHER): Payer: Medicare Other | Admitting: Ophthalmology

## 2023-01-11 DIAGNOSIS — H35033 Hypertensive retinopathy, bilateral: Secondary | ICD-10-CM | POA: Diagnosis not present

## 2023-01-11 DIAGNOSIS — H353231 Exudative age-related macular degeneration, bilateral, with active choroidal neovascularization: Secondary | ICD-10-CM

## 2023-01-11 DIAGNOSIS — I10 Essential (primary) hypertension: Secondary | ICD-10-CM | POA: Diagnosis not present

## 2023-01-11 DIAGNOSIS — H43813 Vitreous degeneration, bilateral: Secondary | ICD-10-CM

## 2023-01-11 DIAGNOSIS — H3411 Central retinal artery occlusion, right eye: Secondary | ICD-10-CM | POA: Diagnosis not present

## 2023-01-12 ENCOUNTER — Encounter: Payer: Medicare Other | Admitting: Rehabilitative and Restorative Service Providers"

## 2023-01-16 ENCOUNTER — Ambulatory Visit (INDEPENDENT_AMBULATORY_CARE_PROVIDER_SITE_OTHER): Payer: Medicare Other | Admitting: Physical Therapy

## 2023-01-16 ENCOUNTER — Encounter: Payer: Self-pay | Admitting: Physical Therapy

## 2023-01-16 ENCOUNTER — Encounter: Payer: Medicare Other | Admitting: Rehabilitative and Restorative Service Providers"

## 2023-01-16 DIAGNOSIS — R296 Repeated falls: Secondary | ICD-10-CM | POA: Diagnosis not present

## 2023-01-16 DIAGNOSIS — R2689 Other abnormalities of gait and mobility: Secondary | ICD-10-CM

## 2023-01-16 DIAGNOSIS — R2681 Unsteadiness on feet: Secondary | ICD-10-CM

## 2023-01-16 DIAGNOSIS — R262 Difficulty in walking, not elsewhere classified: Secondary | ICD-10-CM | POA: Diagnosis not present

## 2023-01-16 DIAGNOSIS — M6281 Muscle weakness (generalized): Secondary | ICD-10-CM | POA: Diagnosis not present

## 2023-01-16 NOTE — Therapy (Signed)
OUTPATIENT PHYSICAL THERAPY LOWER EXTREMITY EVALUATION   Patient Name: Jay Tran MRN: [id number] DOB:January 25, 1934, 89 y.o., male Today's Date: 01/16/2023  END OF SESSION:  PT End of Session - 01/16/23 1520     Visit Number 1    Number of Visits 17    Date for PT Re-Evaluation 03/13/23    Authorization Type Medicare    Progress Note Due on Visit 10    PT Start Time 1432    PT Stop Time 1513    PT Time Calculation (min) 41 min    Activity Tolerance Patient tolerated treatment well    Behavior During Therapy WFL for tasks assessed/performed             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 (HCC)    Hx: of after 2013 back surgery   GERD (gastroesophageal reflux disease)    Hearing aid worn    Hx: of right ear   Hyperlipemia    Hypertension    Lumbar herniated disc    Lumbar stenosis    Macular degeneration right eye dry   PONV (postoperative nausea and vomiting)    takes iv nausea meds    Seasonal allergies    Hx: of   Stroke [facility])    Past Surgical History:  Procedure Laterality Date   BACK SURGERY  2013, x 3 with dr Ronnald Ramp 2014, 2016 and 2019   x 4, takes back injections monthly   COLONOSCOPY     Hx: of   ENDARTERECTOMY Right 02/07/2020   Procedure: ENDARTERECTOMY CAROTID;  Surgeon: Serafina Mitchell, MD;  Location: [place];  Service: Vascular;  Laterality: Right;   EYE SURGERY     bil catarcts 02/2016   HERNIA REPAIR  several yrs ago   inguinal   LUMBAR LAMINECTOMY  10/26/2012   Procedure: MICRODISCECTOMY LUMBAR LAMINECTOMY;  Surgeon: Marybelle Killings, MD;  Location: [place];  Service: Orthopedics;  Laterality: Right;  Right L4-5 Microdiscectomy   LUMBAR WOUND DEBRIDEMENT N/A 09/15/2021   Procedure: revision of thoracic incision, Irrigation and debridement of battery pocket wound;  Surgeon: Eustace Moore, MD;  Location: [place];  Service: Neurosurgery;  Laterality: N/A;   MAXIMUM ACCESS (MAS)POSTERIOR LUMBAR INTERBODY FUSION (PLIF) 1 LEVEL N/A 04/15/2015   Procedure: Maximum Access Surgery Posterior Lumbar Interbody Fusion Lumbar two-Lumbar three extension of hardware;  Surgeon: Eustace Moore, MD;  Location: [place] NEURO ORS;  Service: Neurosurgery;  Laterality: N/A;   PATCH ANGIOPLASTY Right 02/07/2020   Procedure: PATCH ANGIOPLASTY USING Rueben Bash BIOLOGIC PATCH;  Surgeon: Serafina Mitchell, MD;  Location: [facility];  Service: Vascular;  Laterality: Right;   ROTATOR CUFF REPAIR     right shoulder - 3 times   SPINAL CORD STIMULATOR INSERTION  08/11/2021   TONSILLECTOMY  as child   TRANSURETHRAL RESECTION OF BLADDER TUMOR N/A 02/24/2017   Procedure: TRANSURETHRAL RESECTION OF BLADDER TUMOR (TURBT);  Surgeon: Alexis Frock, MD;  Location: [facility];  Service: Urology;  Laterality: N/A;   TRANSURETHRAL RESECTION OF PROSTATE N/A 11/20/2020   Procedure: TRANSURETHRAL RESECTION OF THE PROSTATE (TURP);  Surgeon: Alexis Frock, MD;  Location: [place];  Service: Urology;  Laterality: N/A;   Patient Active Problem List   Diagnosis Date Noted   Unilateral primary osteoarthritis, right hip 03/30/2022   Prostatic  hypertrophy 11/20/2020   Internal carotid artery stenosis 02/05/2020   Essential hypertension 02/05/2020   Benign prostatic hyperplasia with lower urinary tract symptoms 02/05/2020   GERD (gastroesophageal reflux disease) 02/05/2020   Chronic back pain 02/05/2020   Vision loss of right eye 02/04/2020   Wound drainage 08/20/2018   Chronic pain of both shoulders 02/22/2017   S/P lumbar spinal fusion 04/15/2015   HNP (herniated nucleus pulposus), lumbar 10/26/2012    Class: Diagnosis of    PCP: Leonard Downing   REFERRING PROVIDER: Vanetta Mulders, MD  REFERRING DIAG: 01/09/2023  THERAPY DIAG:  Unsteadiness on feet  Repeated falls  Muscle weakness (generalized)  Difficulty in walking,  not elsewhere classified  Other abnormalities of gait and mobility  Rationale for Evaluation and Treatment: Rehabilitation  ONSET DATE: 01/09/2023  SUBJECTIVE:   SUBJECTIVE STATEMENT: Working on my balance now, the biggest thing with my balance is I can't see. Nothing looks like it is. I go to pick up something and it looks like its moved over. When I step I have to be real careful. No doctor ever told me I have double vision. I have blurry vision. My eye doctor knows, he told me not to change my glasses. Some falls in the past few months.   PERTINENT HISTORY:  PAIN:  Are you having pain? Yes: NPRS scale: 5/10 Pain location: L shoulder  Pain description: "I don't know"  Aggravating factors: raising shoulder/moving shoulder  Relieving factors: pain patches   PRECAUTIONS: Fall and Other: L shoulder pain, spinal cord stimulator, poor vision R eye, HOH     WEIGHT BEARING RESTRICTIONS: No  FALLS:  Has patient fallen in last 6 months? Yes. Number of falls "few falls in the past couple months"   LIVING ENVIRONMENT: Lives with: lives with their spouse Lives in: House/apartment Stairs: 3-4 STE U rail, stairs down to basement but does not use them  Has following equipment at home: Single point cane, Environmental consultant - 2 wheeled, Environmental consultant - 4 wheeled, Crutches, Wheelchair (manual), shower chair, and Grab bars  OCCUPATION: retired   PLOF: Requires assistive device for independence  PATIENT GOALS: walk good again   NEXT MD VISIT: "last of the month, February"   OBJECTIVE:   DIAGNOSTIC FINDINGS:   PATIENT SURVEYS:  FOTO 40, predicted 3  COGNITION: Overall cognitive status:  known STM impairments, 0/3 on 3 word recall, unable to perform serial 7s test without totalA       SENSATION:  Impaired LTT B   EDEMA:    MUSCLE LENGTH:   POSTURE:   PALPATION:   LOWER EXTREMITY ROM:  Active ROM Right eval Left eval  Hip flexion    Hip extension    Hip abduction    Hip adduction     Hip internal rotation    Hip external rotation    Knee flexion    Knee extension    Ankle dorsiflexion    Ankle plantarflexion    Ankle inversion    Ankle eversion     (Blank rows = not tested)  LOWER EXTREMITY MMT:  MMT Right eval Left eval  Hip flexion 3+ 3+  Hip extension    Hip abduction 3 tested in sitting  3 tested in sitting   Hip adduction    Hip internal rotation    Hip external rotation    Knee flexion 4 4-  Knee extension 4- 4  Ankle dorsiflexion 3+ 4  Ankle plantarflexion    Ankle inversion  Ankle eversion     (Blank rows = not tested)  LOWER EXTREMITY SPECIAL TESTS:    FUNCTIONAL TESTS:  5 times sit to stand: 19.2 seconds no UEs  Balance- narrow BOS 30 seconds min guard no UEs       tandem stance 1 second B no UEs SLS unable no UEs   GAIT: Distance walked: in clinic distances  Assistive device utilized: Environmental consultant - 2 wheeled Level of assistance: CGA Comments: flexed at hips, pushes RW too far ahead, unsteady on turns, short step lengths, limited ankle DF    TODAY'S TREATMENT:                                                                                                                              DATE:   Eval   Objective measures/education   TherEx   Nustep L5 x6 minutes BLEs only    PATIENT EDUCATION:  Education details: exam findings, POC, HEP  Person educated: Patient Education method: Consulting civil engineer, Demonstration, and Handouts Education comprehension: verbalized understanding, returned demonstration, and needs further education  HOME EXERCISE PROGRAM: Will give 2nd session  ASSESSMENT:  CLINICAL IMPRESSION: Patient is a 89 y.o. M who was seen today for physical therapy evaluation and treatment for balance problems. He is well known to therapy here and was recently seen for a shoulder injury he sustained during a fall, unfortunately we made limited progress here and pt/MD had requested that we stop PT interventions for the  shoulder. Now has new referral for balance. Exam reveals significant functional weakness in BLEs, severe gait impairment, severe balance impairment, limited mobility, poor safety awareness and cognition, and very high fall risk. Will benefit from skilled PT services to address all functional concerns and reduce fall risk.   OBJECTIVE IMPAIRMENTS: Abnormal gait, decreased activity tolerance, decreased balance, decreased cognition, decreased coordination, decreased knowledge of use of DME, decreased mobility, difficulty walking, decreased strength, decreased safety awareness, impaired sensation, improper body mechanics, and postural dysfunction.   ACTIVITY LIMITATIONS: standing, stairs, transfers, bed mobility, and locomotion level  PARTICIPATION LIMITATIONS: driving, shopping, community activity, yard work, and church  PERSONAL FACTORS: Age, Behavior pattern, Education, Fitness, Past/current experiences, Social background, and Time since onset of injury/illness/exacerbation are also affecting patient's functional outcome.   REHAB POTENTIAL: Fair limited progress with PT in the past, limited compliance with HEP in the past   CLINICAL DECISION MAKING: Evolving/moderate complexity  EVALUATION COMPLEXITY: Moderate   GOALS: Goals reviewed with patient? Yes  SHORT TERM GOALS: Target date: 02/13/2023   Will be compliant with appropriate progressive HEP with no more than MinA from spouse  Baseline: Goal status: INITIAL  2.  Will be able to hold tandem stance for 10 seconds with no UE support and no more than MinA  Baseline:  Goal status: INITIAL  3.  Will be able to hold SLS for 5 seconds with no UE support with no more than MinA  Baseline:  Goal status: INITIAL  4.  Will demonstrate improved gait mechanics to include upright posture, better proximity to LRAD, and improved ankle DF during gait  Baseline:  Goal status: INITIAL    LONG TERM GOALS: Target date: 03/13/2023    MMT in BLEs  to improve by 1 grade in all weak groups  Baseline:  Goal status: INITIAL  2.  Will score at least 40 on Berg to show improved functional balance  Baseline:  Goal status: INITIAL  3.  Will complete 5x STS in 14 seconds or less to show improved mobility  Baseline:  Goal status: INITIAL  4.  Will be able to perform a shopping trip with LRAD with no increased fatigue and low fall risk in order to show improved community access  Baseline:  Goal status: INITIAL    PLAN:  PT FREQUENCY: 2x/week  PT DURATION: 8 weeks  PLANNED INTERVENTIONS: Therapeutic exercises, Therapeutic activity, Neuromuscular re-education, Balance training, Gait training, Patient/Family education, Self Care, Joint mobilization, Stair training, DME instructions, Aquatic Therapy, Cryotherapy, Moist heat, Taping, Manual therapy, and Re-evaluation  PLAN FOR NEXT SESSION: general strength, balance, endurance; needs to practice safe transfers and gait with RW. Needs HEP    Deniece Ree PT DPT PN2  01/16/2023, 3:21 PM

## 2023-01-19 ENCOUNTER — Encounter: Payer: Medicare Other | Admitting: Rehabilitative and Restorative Service Providers"

## 2023-01-23 ENCOUNTER — Encounter: Payer: Medicare Other | Admitting: Rehabilitative and Restorative Service Providers"

## 2023-01-23 ENCOUNTER — Ambulatory Visit (INDEPENDENT_AMBULATORY_CARE_PROVIDER_SITE_OTHER): Payer: Medicare Other | Admitting: Rehabilitative and Restorative Service Providers"

## 2023-01-23 ENCOUNTER — Encounter: Payer: Self-pay | Admitting: Rehabilitative and Restorative Service Providers"

## 2023-01-23 DIAGNOSIS — R2689 Other abnormalities of gait and mobility: Secondary | ICD-10-CM

## 2023-01-23 DIAGNOSIS — M6281 Muscle weakness (generalized): Secondary | ICD-10-CM | POA: Diagnosis not present

## 2023-01-23 DIAGNOSIS — R262 Difficulty in walking, not elsewhere classified: Secondary | ICD-10-CM

## 2023-01-23 DIAGNOSIS — R2681 Unsteadiness on feet: Secondary | ICD-10-CM | POA: Diagnosis not present

## 2023-01-23 DIAGNOSIS — R296 Repeated falls: Secondary | ICD-10-CM | POA: Diagnosis not present

## 2023-01-23 NOTE — Therapy (Signed)
OUTPATIENT PHYSICAL THERAPY TREATMENT   Patient Name: Jay Tran MRN: [id number] DOB:1934/03/29, 89 y.o., male Today's Date: 01/23/2023  END OF SESSION:  PT End of Session - 01/23/23 1511     Visit Number 2    Number of Visits 17    Date for PT Re-Evaluation 03/13/23    Authorization Type Medicare    Progress Note Due on Visit 10    PT Start Time 1502    PT Stop Time 1542    PT Time Calculation (min) 40 min    Activity Tolerance Patient tolerated treatment well    Behavior During Therapy [facility] for tasks assessed/performed              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 ([place])    Hx: of after 2013 back surgery   GERD (gastroesophageal reflux disease)    Hearing aid worn    Hx: of right ear   Hyperlipemia    Hypertension    Lumbar herniated disc    Lumbar stenosis    Macular degeneration right eye dry   PONV (postoperative nausea and vomiting)    takes iv nausea meds    Seasonal allergies    Hx: of   Stroke [facility])    Past Surgical History:  Procedure Laterality Date   BACK SURGERY  2013, x 3 with dr Ronnald Ramp 2014, 2016 and 2019   x 4, takes back injections monthly   COLONOSCOPY     Hx: of   ENDARTERECTOMY Right 02/07/2020   Procedure: ENDARTERECTOMY CAROTID;  Surgeon: Serafina Mitchell, MD;  Location: [place];  Service: Vascular;  Laterality: Right;   EYE SURGERY     bil catarcts 02/2016   HERNIA REPAIR  several yrs ago   inguinal   LUMBAR LAMINECTOMY  10/26/2012   Procedure: MICRODISCECTOMY LUMBAR LAMINECTOMY;  Surgeon: Marybelle Killings, MD;  Location: [place];  Service: Orthopedics;  Laterality: Right;  Right L4-5 Microdiscectomy   LUMBAR WOUND DEBRIDEMENT N/A 09/15/2021   Procedure: revision of thoracic incision, Irrigation and debridement of battery pocket wound;  Surgeon: Eustace Moore, MD;  Location: [place];  Service:  Neurosurgery;  Laterality: N/A;   MAXIMUM ACCESS (MAS)POSTERIOR LUMBAR INTERBODY FUSION (PLIF) 1 LEVEL N/A 04/15/2015   Procedure: Maximum Access Surgery Posterior Lumbar Interbody Fusion Lumbar two-Lumbar three extension of hardware;  Surgeon: Eustace Moore, MD;  Location: [place] NEUR[facility] ORS;  Service: Neurosurgery;  Laterality: N/A;   PATCH ANGIOPLASTY Right 02/07/2020   Procedure: PATCH ANGIOPLASTY USING Rueben Bash BIOLOGIC PATCH;  Surgeon: Serafina Mitchell, MD;  [facility];  Service: Vascular;  Laterality: Right;   ROTATOR CUFF REPAIR     right shoulder - 3 times   SPINAL CORD STIMULATOR INSERTION  08/11/2021   TONSILLECTOMY  as child   TRANSURETHRAL RESECTION OF BLADDER TUMOR N/A 02/24/2017   Procedure: TRANSURETHRAL RESECTION OF BLADDER TUMOR (TURBT);  Surgeon: Alexis Frock, MD;  [facility]: [facility];  Service: Urology;  Laterality: N/A;   TRANSURETHRAL RESECTION OF PROSTATE N/A 11/20/2020   Procedure: TRANSURETHRAL RESECTION OF THE PROSTATE (TURP);  Surgeon: Alexis Frock, MD;  Location: [facility];  Service: Urology;  Laterality: N/A;   Patient Active Problem List   Diagnosis Date Noted   Unilateral primary osteoarthritis, right hip 03/30/2022   Prostatic hypertrophy  11/20/2020   Internal carotid artery stenosis 02/05/2020   Essential hypertension 02/05/2020   Benign prostatic hyperplasia with lower urinary tract symptoms 02/05/2020   GERD (gastroesophageal reflux disease) 02/05/2020   Chronic back pain 02/05/2020   Vision loss of right eye 02/04/2020   Wound drainage 08/20/2018   Chronic pain of both shoulders 02/22/2017   S/P lumbar spinal fusion 04/15/2015   HNP (herniated nucleus pulposus), lumbar 10/26/2012    Class: Diagnosis of    PCP: Leonard Downing   REFERRING PROVIDER: Vanetta Mulders, MD  REFERRING DIAG: 01/09/2023  THERAPY DIAG:  Unsteadiness on feet  Repeated falls  Muscle weakness (generalized)  Difficulty in walking, not elsewhere  classified  Other abnormalities of gait and mobility  Rationale for Evaluation and Treatment: Rehabilitation  ONSET DATE: 01/09/2023  SUBJECTIVE:   SUBJECTIVE STATEMENT: He indicated he wasn't sure we were gonna help because his vision is bad.   He reported no specific pain upon arrival today.    PERTINENT HISTORY:  PAIN:  Are you having pain? Yes: NPRS scale: 5/10 Pain location: L shoulder  Pain description: "I don't know"  Aggravating factors: raising shoulder/moving shoulder  Relieving factors: pain patches   PRECAUTIONS: Fall and Other: L shoulder pain, spinal cord stimulator, poor vision R eye, HOH     WEIGHT BEARING RESTRICTIONS: No  FALLS:  Has patient fallen in last 6 months? Yes. Number of falls "few falls in the past couple months"   LIVING ENVIRONMENT: Lives with: lives with their spouse Lives in: House/apartment Stairs: 3-4 STE U rail, stairs down to basement but does not use them  Has following equipment at home: Single point cane, Environmental consultant - 2 wheeled, Environmental consultant - 4 wheeled, [place], Wheelchair (manual), shower chair, and Grab bars  OCCUPATION: retired   PLOF: Requires assistive device for independence  PATIENT GOALS: walk good again   NEXT MD VISIT: "last of the month, February"   OBJECTIVE:   DIAGNOSTIC FINDINGS:   PATIENT SURVEYS:  01/16/2023 FOTO 40, predicted 1  COGNITION: 01/16/2023 Overall cognitive status:  known STM impairments, 0/3 on 3 word recall, unable to perform serial 7s test without totalA       SENSATION: 01/16/2023  Impaired LTT B   EDEMA:    MUSCLE LENGTH:   POSTURE:   PALPATION:   LOWER EXTREMITY ROM:  Active ROM Right eval Left eval  Hip flexion    Hip extension    Hip abduction    Hip adduction    Hip internal rotation    Hip external rotation    Knee flexion    Knee extension    Ankle dorsiflexion    Ankle plantarflexion    Ankle inversion    Ankle eversion     (Blank rows = not tested)  LOWER  EXTREMITY MMT:  MMT Right 01/16/2023 Left 01/16/2023  Hip flexion 3+ 3+  Hip extension    Hip abduction 3 tested in sitting  3 tested in sitting   Hip adduction    Hip internal rotation    Hip external rotation    Knee flexion 4 4-  Knee extension 4- 4  Ankle dorsiflexion 3+ 4  Ankle plantarflexion    Ankle inversion    Ankle eversion     (Blank rows = not tested)  LOWER EXTREMITY SPECIAL TESTS:    FUNCTIONAL TESTS:  01/16/2023 5 times sit to stand: 19.2 seconds no UEs  Balance- narrow BOS 30 seconds min guard no UEs  tandem stance 1 second B no UEs SLS unable no UEs   GAIT: 01/16/2023 Distance walked: in clinic distances  Assistive device utilized: Environmental consultant - 2 wheeled Level of assistance: CGA Comments: flexed at hips, pushes RW too far ahead, unsteady on turns, short step lengths, limited ankle DF    TODAY'S TREATMENT: 01/23/2023: Therex: Nustep Lvl 5 8 mins UE/LE Leg press double leg 2 x 15 62 lbs   Neuro Re-ed: Feet together stance eyes open 1 min x 2 c SBA to CGA in // bars Walking forward with Rt UE on bar simulating cane use 10 ft x 8, reverse ambulation 10 ft x 4 c cues for increased step length Lateral stepping in // bars double hand on bar 10 ft x 3 each way with SBA Alternating foot on 6 inch step in // bars Rt hand assist on bar c CGA to min A at times x 10 bilateral   01/16/2023: Objective measures/education  TherEx   Nustep L5 x6 minutes BLEs only    PATIENT EDUCATION:  Education details: exam findings, POC, HEP  Person educated: Patient Education method: Consulting civil engineer, Demonstration, and Handouts Education comprehension: verbalized understanding, returned demonstration, and needs further education  HOME EXERCISE PROGRAM: Will give 2nd session  ASSESSMENT:  CLINICAL IMPRESSION: Consistent cues necessary to improve safety with use of walker in transfers (proper placement, hands on chairs not walker).  Early balance activity c fair to good  performance but with SBA and hand use at times throughout.  Continued skilled PT services indicated at this time.  Held balance activity from HEP for safety reasons.   OBJECTIVE IMPAIRMENTS: Abnormal gait, decreased activity tolerance, decreased balance, decreased cognition, decreased coordination, decreased knowledge of use of DME, decreased mobility, difficulty walking, decreased strength, decreased safety awareness, impaired sensation, improper body mechanics, and postural dysfunction.   ACTIVITY LIMITATIONS: standing, stairs, transfers, bed mobility, and locomotion level  PARTICIPATION LIMITATIONS: driving, shopping, community activity, yard work, and church  PERSONAL FACTORS: Age, Behavior pattern, Education, Fitness, Past/current experiences, Social background, and Time since onset of injury/illness/exacerbation are also affecting patient's functional outcome.   REHAB POTENTIAL: Fair limited progress with PT in the past, limited compliance with HEP in the past   CLINICAL DECISION MAKING: Evolving/moderate complexity  EVALUATION COMPLEXITY: Moderate   GOALS: Goals reviewed with patient? Yes  SHORT TERM GOALS: Target date: 02/13/2023   Will be compliant with appropriate progressive HEP with no more than MinA from spouse  Baseline: Goal status: on going 01/23/2023  2.  Will be able to hold tandem stance for 10 seconds with no UE support and no more than MinA  Baseline:  Goal status: on going 01/23/2023  3.  Will be able to hold SLS for 5 seconds with no UE support with no more than MinA  Baseline:  Goal status: on going 01/23/2023  4.  Will demonstrate improved gait mechanics to include upright posture, better proximity to LRAD, and improved ankle DF during gait  Baseline:  Goal status: on going 01/23/2023    LONG TERM GOALS: Target date: 03/13/2023    MMT in BLEs to improve by 1 grade in all weak groups  Baseline:  Goal status: INITIAL  2.  Will score at least 40 on Berg to  show improved functional balance  Baseline:  Goal status: INITIAL  3.  Will complete 5x STS in 14 seconds or less to show improved mobility  Baseline:  Goal status: INITIAL  4.  Will be able to perform a  shopping trip with [place] with no increased fatigue and low fall risk in order to show improved community access  Baseline:  Goal status: INITIAL    PLAN:  PT FREQUENCY: 2x/week  PT DURATION: 8 weeks  PLANNED INTERVENTIONS: Therapeutic exercises, Therapeutic activity, Neuromuscular re-education, Balance training, Gait training, Patient/Family education, Self Care, Joint mobilization, Stair training, DME instructions, Aquatic Therapy, Cryotherapy, Moist heat, Taping, Manual therapy, and Re-evaluation  PLAN FOR NEXT SESSION: Progressive balance and LE strengthening to tolerance.  Close guarding in balance necessary   Scot Jun, PT, DPT, OCS, ATC 01/23/23  3:44 PM

## 2023-01-25 ENCOUNTER — Ambulatory Visit (INDEPENDENT_AMBULATORY_CARE_PROVIDER_SITE_OTHER): Payer: Medicare Other | Admitting: Physical Therapy

## 2023-01-25 ENCOUNTER — Encounter: Payer: Self-pay | Admitting: Physical Therapy

## 2023-01-25 DIAGNOSIS — R2681 Unsteadiness on feet: Secondary | ICD-10-CM

## 2023-01-25 DIAGNOSIS — M6281 Muscle weakness (generalized): Secondary | ICD-10-CM

## 2023-01-25 DIAGNOSIS — R296 Repeated falls: Secondary | ICD-10-CM | POA: Diagnosis not present

## 2023-01-25 DIAGNOSIS — R2689 Other abnormalities of gait and mobility: Secondary | ICD-10-CM | POA: Diagnosis not present

## 2023-01-25 DIAGNOSIS — R262 Difficulty in walking, not elsewhere classified: Secondary | ICD-10-CM

## 2023-01-25 NOTE — Therapy (Signed)
OUTPATIENT PHYSICAL THERAPY TREATMENT   Patient Name: Jay Tran MRN: [id number] DOB:09/04/1934, 89 y.o., male Today's Date: 01/25/2023  END OF SESSION:  PT End of Session - 01/25/23 1514     Visit Number 3    Number of Visits 17    Date for PT Re-Evaluation 03/13/23    Authorization Type Medicare    Progress Note Due on Visit 10    PT Start Time 1431    PT Stop Time 1512    PT Time Calculation (min) 41 min    Activity Tolerance Patient tolerated treatment well    Behavior During Therapy WFL for tasks assessed/performed               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 ([facility])    Hx: of after 2013 back surgery   GERD (gastroesophageal reflux disease)    Hearing aid worn    Hx: of right ear   Hyperlipemia    Hypertension    Lumbar herniated disc    Lumbar stenosis    Macular degeneration right eye dry   PONV (postoperative nausea and vomiting)    takes iv nausea meds    Seasonal allergies    Hx: of   Stroke [facility])    Past Surgical History:  Procedure Laterality Date   BACK SURGERY  2013, x 3 with dr Ronnald Ramp 2014, 2016 and 2019   x 4, takes back injections monthly   COLONOSCOPY     Hx: of   ENDARTERECTOMY Right 02/07/2020   Procedure: ENDARTERECTOMY CAROTID;  Surgeon: Serafina Mitchell, MD;  Location: [place];  Service: Vascular;  Laterality: Right;   EYE SURGERY     bil catarcts 02/2016   HERNIA REPAIR  several yrs ago   inguinal   LUMBAR LAMINECTOMY  10/26/2012   Procedure: MICRODISCECTOMY LUMBAR LAMINECTOMY;  Surgeon: Marybelle Killings, MD;  Location: [place];  Service: Orthopedics;  Laterality: Right;  Right L4-5 Microdiscectomy   LUMBAR WOUND DEBRIDEMENT N/A 09/15/2021   Procedure: revision of thoracic incision, Irrigation and debridement of battery pocket wound;  Surgeon: Eustace Moore, MD;  Location: [place];  Service:  Neurosurgery;  Laterality: N/A;   MAXIMUM ACCESS (MAS)POSTERIOR LUMBAR INTERBODY FUSION (PLIF) 1 LEVEL N/A 04/15/2015   Procedure: Maximum Access Surgery Posterior Lumbar Interbody Fusion Lumbar two-Lumbar three extension of hardware;  Surgeon: Eustace Moore, MD;  Location: [place] NEUR[facility]S;  Service: Neurosurgery;  Laterality: N/A;   PATCH ANGIOPLASTY Right 02/07/2020   Procedure: PATCH ANGIOPLASTY USING Rueben Bash BIOLOGIC PATCH;  Surgeon: Serafina Mitchell, MD;  Location: [facility];  Service: Vascular;  Laterality: Right;   ROTATOR CUFF REPAIR     right shoulder - 3 times   SPINAL CORD STIMULATOR INSERTION  08/11/2021   TONSILLECTOMY  as child   TRANSURETHRAL RESECTION OF BLADDER TUMOR N/A 02/24/2017   Procedure: TRANSURETHRAL RESECTION OF BLADDER TUMOR (TURBT);  Surgeon: Alexis Frock, MD;  [facility];  Service: Urology;  Laterality: N/A;   TRANSURETHRAL RESECTION OF PROSTATE N/A 11/20/2020   Procedure: TRANSURETHRAL RESECTION OF THE PROSTATE (TURP);  Surgeon: Alexis Frock, MD;  [facility]  Service: Urology;  Laterality: N/A;   Patient Active Problem List   Diagnosis Date Noted   Unilateral primary osteoarthritis, right hip 03/30/2022   Prostatic  hypertrophy 11/20/2020   Internal carotid artery stenosis 02/05/2020   Essential hypertension 02/05/2020   Benign prostatic hyperplasia with lower urinary tract symptoms 02/05/2020   GERD (gastroesophageal reflux disease) 02/05/2020   Chronic back pain 02/05/2020   Vision loss of right eye 02/04/2020   Wound drainage 08/20/2018   Chronic pain of both shoulders 02/22/2017   S/P lumbar spinal fusion 04/15/2015   HNP (herniated nucleus pulposus), lumbar 10/26/2012    Class: Diagnosis of    PCP: Leonard Downing   REFERRING PROVIDER: Vanetta Mulders, MD  REFERRING DIAG: 01/09/2023  THERAPY DIAG:  Unsteadiness on feet  Repeated falls  Muscle weakness (generalized)  Difficulty in walking, not elsewhere  classified  Other abnormalities of gait and mobility  Rationale for Evaluation and Treatment: Rehabilitation  ONSET DATE: 01/09/2023  SUBJECTIVE:   SUBJECTIVE STATEMENT:  Felt good after last time with Ronalee Belts, I'm just worn out I don't if you can repair me or not. No falls since last time. I can't stay close to the walker because it hurts my shoulders and I forget. I'm like an old wore out car, I don't know there's anything you can do for me.   PERTINENT HISTORY:  PAIN:  Are you having pain? Yes: NPRS scale: 5/10 Pain location: R shoulder  and right hip Pain description: "it feels like pain, medium pain"  Aggravating factors: "I don't know just anything" Relieving factors: I don't know not a whole lot of anything  PRECAUTIONS: Fall and Other: L shoulder pain, spinal cord stimulator, poor vision R eye, HOH     WEIGHT BEARING RESTRICTIONS: No  FALLS:  Has patient fallen in last 6 months? Yes. Number of falls "few falls in the past couple months"   LIVING ENVIRONMENT: Lives with: lives with their spouse Lives in: House/apartment Stairs: 3-4 STE U rail, stairs down to basement but does not use them  Has following equipment at home: Single point cane, Environmental consultant - 2 wheeled, Environmental consultant - 4 wheeled, [place], Wheelchair (manual), shower chair, and Grab bars  OCCUPATION: retired   PLOF: Requires assistive device for independence  PATIENT GOALS: walk good again   NEXT MD VISIT: "last of the month, February"   OBJECTIVE:   DIAGNOSTIC FINDINGS:   PATIENT SURVEYS:  01/16/2023 FOTO 16, predicted 28  COGNITION: 01/16/2023 Overall cognitive status:  known STM impairments, 0/3 on 3 word recall, unable to perform serial 7s test without totalA       SENSATION: 01/16/2023  Impaired LTT B   EDEMA:    MUSCLE LENGTH:   POSTURE:   PALPATION:   LOWER EXTREMITY ROM:  Active ROM Right eval Left eval  Hip flexion    Hip extension    Hip abduction    Hip adduction    Hip  internal rotation    Hip external rotation    Knee flexion    Knee extension    Ankle dorsiflexion    Ankle plantarflexion    Ankle inversion    Ankle eversion     (Blank rows = not tested)  LOWER EXTREMITY MMT:  MMT Right 01/16/2023 Left 01/16/2023  Hip flexion 3+ 3+  Hip extension    Hip abduction 3 tested in sitting  3 tested in sitting   Hip adduction    Hip internal rotation    Hip external rotation    Knee flexion 4 4-  Knee extension 4- 4  Ankle dorsiflexion 3+ 4  Ankle plantarflexion    Ankle inversion  Ankle eversion     (Blank rows = not tested)  LOWER EXTREMITY SPECIAL TESTS:    FUNCTIONAL TESTS:  01/16/2023 5 times sit to stand: 19.2 seconds no UEs  Balance- narrow BOS 30 seconds min guard no UEs       tandem stance 1 second B no UEs SLS unable no UEs   GAIT: 01/16/2023 Distance walked: in clinic distances  Assistive device utilized: Environmental consultant - 2 wheeled Level of assistance: CGA Comments: flexed at hips, pushes RW too far ahead, unsteady on turns, short step lengths, limited ankle DF    TODAY'S TREATMENT:   01/25/23  NMR  Semi-tandem stance 2x30 seconds B up to [place] for balance solid surface Narrow BOS EO progressing to EC 3x30 seconds solid surface  Standing marches no UEs x20 solid surface Lateral side steps along edge of mat table 3 rounds MinA Static standing with light external perturbations solid surface MinA for balance Lateral side steps along edge of mat table with randomized AP perturbations x4 rounds   TherEx  Seated hip ABD red TB x20 with 3 second holds (lots of compensations despite cues) Seated marches x15 each LE  U clamshells x15 B red TB  LAQs red TB x15 B  Ankle DF red TB x15 B   01/23/23   Therex: Nustep Lvl 5 8 mins UE/LE Leg press double leg 2 x 15 62 lbs   Neuro Re-ed: Feet together stance eyes open 1 min x 2 c SBA to CGA in // bars Walking forward with Rt UE on bar simulating cane use 10 ft x 8, reverse  ambulation 10 ft x 4 c cues for increased step length Lateral stepping in // bars double hand on bar 10 ft x 3 each way with SBA Alternating foot on 6 inch step in // bars Rt hand assist on bar c CGA to min A at times x 10 bilateral   01/16/2023: Objective measures/education  TherEx   Nustep L5 x6 minutes BLEs only    PATIENT EDUCATION:  Education details: exam findings, POC, HEP  Person educated: Patient Education method: Consulting civil engineer, Media planner, and Handouts Education comprehension: verbalized understanding, returned demonstration, and needs further education  HOME EXERCISE PROGRAM: Will give 2nd session  ASSESSMENT:  CLINICAL IMPRESSION:  Mr. Burzynski arrives doing OK today, we spent the majority of today's session working on balance. Very unsteady, needs up to [place] to maintain upright with challenging tasks. Also worked on strength a bit. Will continue efforts, he is a very high fall risk and actually had a fall the other week when he crossed his legs over each other in the living room. Still having a hard time using RW safely. Often needed Mod-Max cues for correct performance of exercises today, did not assign HEP.   OBJECTIVE IMPAIRMENTS: Abnormal gait, decreased activity tolerance, decreased balance, decreased cognition, decreased coordination, decreased knowledge of use of DME, decreased mobility, difficulty walking, decreased strength, decreased safety awareness, impaired sensation, improper body mechanics, and postural dysfunction.   ACTIVITY LIMITATIONS: standing, stairs, transfers, bed mobility, and locomotion level  PARTICIPATION LIMITATIONS: driving, shopping, community activity, yard work, and church  PERSONAL FACTORS: Age, Behavior pattern, Education, Fitness, Past/current experiences, Social background, and Time since onset of injury/illness/exacerbation are also affecting patient's functional outcome.   REHAB POTENTIAL: Fair limited progress with PT in the past,  limited compliance with HEP in the past   CLINICAL DECISION MAKING: Evolving/moderate complexity  EVALUATION COMPLEXITY: Moderate   GOALS: Goals reviewed with patient? Yes  SHORT TERM GOALS: Target date: 02/13/2023   Will be compliant with appropriate progressive HEP with no more than MinA from spouse  Baseline: Goal status: on going 01/23/2023  2.  Will be able to hold tandem stance for 10 seconds with no UE support and no more than MinA  Baseline:  Goal status: on going 01/23/2023  3.  Will be able to hold SLS for 5 seconds with no UE support with no more than MinA  Baseline:  Goal status: on going 01/23/2023  4.  Will demonstrate improved gait mechanics to include upright posture, better proximity to LRAD, and improved ankle DF during gait  Baseline:  Goal status: on going 01/23/2023    LONG TERM GOALS: Target date: 03/13/2023    MMT in BLEs to improve by 1 grade in all weak groups  Baseline:  Goal status: INITIAL  2.  Will score at least 40 on [place] to show improved functional balance  Baseline:  Goal status: INITIAL  3.  Will complete 5x STS in 14 seconds or less to show improved mobility  Baseline:  Goal status: INITIAL  4.  Will be able to perform a shopping trip with LRAD with no increased fatigue and low fall risk in order to show improved community access  Baseline:  Goal status: INITIAL    PLAN:  PT FREQUENCY: 2x/week  PT DURATION: 8 weeks  PLANNED INTERVENTIONS: Therapeutic exercises, Therapeutic activity, Neuromuscular re-education, Balance training, Gait training, Patient/Family education, Self Care, Joint mobilization, Stair training, DME instructions, Aquatic Therapy, Cryotherapy, Moist heat, Taping, Manual therapy, and Re-evaluation  PLAN FOR NEXT SESSION: Progressive balance and LE strengthening to tolerance.  Close guarding in balance necessary  Deniece Ree PT DPT PN2

## 2023-01-26 ENCOUNTER — Encounter: Payer: Medicare Other | Admitting: Rehabilitative and Restorative Service Providers"

## 2023-01-30 ENCOUNTER — Encounter: Payer: Medicare Other | Admitting: Rehabilitative and Restorative Service Providers"

## 2023-01-30 ENCOUNTER — Ambulatory Visit (INDEPENDENT_AMBULATORY_CARE_PROVIDER_SITE_OTHER): Payer: Medicare Other | Admitting: Rehabilitative and Restorative Service Providers"

## 2023-01-30 ENCOUNTER — Encounter: Payer: Self-pay | Admitting: Rehabilitative and Restorative Service Providers"

## 2023-01-30 DIAGNOSIS — R2681 Unsteadiness on feet: Secondary | ICD-10-CM

## 2023-01-30 DIAGNOSIS — R296 Repeated falls: Secondary | ICD-10-CM

## 2023-01-30 DIAGNOSIS — R2689 Other abnormalities of gait and mobility: Secondary | ICD-10-CM | POA: Diagnosis not present

## 2023-01-30 DIAGNOSIS — R262 Difficulty in walking, not elsewhere classified: Secondary | ICD-10-CM

## 2023-01-30 DIAGNOSIS — M6281 Muscle weakness (generalized): Secondary | ICD-10-CM

## 2023-01-30 NOTE — Therapy (Signed)
OUTPATIENT PHYSICAL THERAPY TREATMENT   Patient Name: Jay Tran MRN: [id number] DOB:[birth year], 89 y.o., male Today's Date: 01/30/2023  END OF SESSION:  PT End of Session - 01/30/23 1425     Visit Number 4    Number of Visits 17    Date for PT Re-Evaluation 03/13/23    Authorization Type Medicare    Progress Note Due on Visit 10    PT Start Time 1419    PT Stop Time [id number]    PT Time Calculation (min) 39 min    Activity Tolerance Patient tolerated treatment well    Behavior During Therapy WFL for tasks assessed/performed                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 (HCC)    Hx: of after 2013 back surgery   GERD (gastroesophageal reflux disease)    Hearing aid worn    Hx: of right ear   Hyperlipemia    Hypertension    Lumbar herniated disc    Lumbar stenosis    Macular degeneration right eye dry   PONV (postoperative nausea and vomiting)    takes iv nausea meds    Seasonal allergies    Hx: of   Stroke [facility])    Past Surgical History:  Procedure Laterality Date   BACK SURGERY  2013, x 3 with dr Ronnald Ramp 2014, 2016 and 2019   x 4, takes back injections monthly   COLONOSCOPY     Hx: of   ENDARTERECTOMY Right 02/07/2020   Procedure: ENDARTERECTOMY CAROTID;  Surgeon: Serafina Mitchell, MD;  Location: [place];  Service: Vascular;  Laterality: Right;   EYE SURGERY     bil catarcts 02/2016   HERNIA REPAIR  several yrs ago   inguinal   LUMBAR LAMINECTOMY  10/26/2012   Procedure: MICRODISCECTOMY LUMBAR LAMINECTOMY;  Surgeon: Marybelle Killings, MD;  Location: [place];  Service: Orthopedics;  Laterality: Right;  Right L4-5 Microdiscectomy   LUMBAR WOUND DEBRIDEMENT N/A 09/15/2021   Procedure: revision of thoracic incision, Irrigation and debridement of battery pocket wound;  Surgeon: Eustace Moore, MD;  Location: [place];   Service: Neurosurgery;  Laterality: N/A;   MAXIMUM ACCESS (MAS)POSTERIOR LUMBAR INTERBODY FUSION (PLIF) 1 LEVEL N/A 04/15/2015   Procedure: Maximum Access Surgery Posterior Lumbar Interbody Fusion Lumbar two-Lumbar three extension of hardware;  Surgeon: Eustace Moore, MD;  Location: [place] [facility];  Service: Neurosurgery;  Laterality: N/A;   PATCH ANGIOPLASTY Right 02/07/2020   Procedure: PATCH ANGIOPLASTY USING [employer] Bash BIOLOGIC PATCH;  Surgeon: Serafina Mitchell, MD;  Location: [facility];  Service: Vascular;  Laterality: Right;   ROTATOR CUFF REPAIR     right shoulder - 3 times   SPINAL CORD STIMULATOR INSERTION  08/11/2021   TONSILLECTOMY  as child   TRANSURETHRAL RESECTION OF BLADDER TUMOR N/A 02/24/2017   Procedure: TRANSURETHRAL RESECTION OF BLADDER TUMOR (TURBT);  Surgeon: Alexis Frock, MD;  Location: [facility];  Service: Urology;  Laterality: N/A;   TRANSURETHRAL RESECTION OF PROSTATE N/A 11/20/2020   Procedure: TRANSURETHRAL RESECTION OF THE PROSTATE (TURP);  Surgeon: Alexis Frock, MD;  Location: [facility];  Service: Urology;  Laterality: N/A;   Patient Active Problem List   Diagnosis Date Noted   Unilateral primary osteoarthritis, right hip 03/30/2022  Prostatic hypertrophy 11/20/2020   Internal carotid artery stenosis 02/05/2020   Essential hypertension 02/05/2020   Benign prostatic hyperplasia with lower urinary tract symptoms 02/05/2020   GERD (gastroesophageal reflux disease) 02/05/2020   Chronic back pain 02/05/2020   Vision loss of right eye 02/04/2020   Wound drainage 08/20/2018   Chronic pain of both shoulders 02/22/2017   S/P lumbar spinal fusion 04/15/2015   HNP (herniated nucleus pulposus), lumbar 10/26/2012    Class: Diagnosis of    PCP: Leonard Downing   REFERRING PROVIDER: Vanetta Mulders, MD  REFERRING DIAG: 01/09/2023  THERAPY DIAG:  Unsteadiness on feet  Repeated falls  Muscle weakness (generalized)  Difficulty in walking, not  elsewhere classified  Other abnormalities of gait and mobility  Rationale for Evaluation and Treatment: Rehabilitation  ONSET DATE: 01/09/2023  SUBJECTIVE:   SUBJECTIVE STATEMENT: He indicated feeling his Lt arm is weak.  Otherwise he didn't report any specific pain.    PERTINENT HISTORY:  PAIN:  NPRS scale: no specific pain.  Pain location:  Pain description: nothing specific  Aggravating factors:  Relieving factors:  PRECAUTIONS: Fall and Other: L shoulder pain, spinal cord stimulator, poor vision R eye, HOH     WEIGHT BEARING RESTRICTIONS: No  FALLS:  Has patient fallen in last 6 months? Yes. Number of falls "few falls in the past couple months"   LIVING ENVIRONMENT: Lives with: lives with their spouse Lives in: House/apartment Stairs: 3-4 STE U rail, stairs down to basement but does not use them  Has following equipment at home: Single point cane, Environmental consultant - 2 wheeled, Environmental consultant - 4 wheeled, [place], Wheelchair (manual), shower chair, and Grab bars  OCCUPATION: retired   PLOF: Requires assistive device for independence  PATIENT GOALS: walk good again   NEXT MD VISIT: "last of the month, February"   OBJECTIVE:   DIAGNOSTIC FINDINGS:   PATIENT SURVEYS:  01/16/2023 FOTO 32, predicted 73  COGNITION: 01/16/2023 Overall cognitive status:  known STM impairments, 0/3 on 3 word recall, unable to perform serial 7s test without totalA       SENSATION: 01/16/2023 Impaired LTT B   EDEMA:    MUSCLE LENGTH:   POSTURE:   PALPATION:   LOWER EXTREMITY ROM:  Active ROM Right eval Left eval  Hip flexion    Hip extension    Hip abduction    Hip adduction    Hip internal rotation    Hip external rotation    Knee flexion    Knee extension    Ankle dorsiflexion    Ankle plantarflexion    Ankle inversion    Ankle eversion     (Blank rows = not tested)  LOWER EXTREMITY MMT:  MMT Right 01/16/2023 Left 01/16/2023 Right 01/30/2023 Left 01/30/2023  Hip  flexion 3+ 3+ 5/5 5/5  Hip extension      Hip abduction 3 tested in sitting  3 tested in sitting     Hip adduction      Hip internal rotation      Hip external rotation      Knee flexion 4 4- 5/5 5/5  Knee extension 4- 4 5/5 4/5  Ankle dorsiflexion 3+ 4    Ankle plantarflexion      Ankle inversion      Ankle eversion       (Blank rows = not tested)  LOWER EXTREMITY SPECIAL TESTS:    FUNCTIONAL TESTS:  01/16/2023 5 times sit to stand: 19.2 seconds no UEs  Balance- narrow BOS 30  seconds min guard no UEs       tandem stance 1 second B no UEs SLS unable no UEs   GAIT: 01/30/2023:  Mod independent ambulation c FWW.  Continued pushed forward placement with forward trunk lean.    01/16/2023 Distance walked: in clinic distances  Assistive device utilized: Environmental consultant - 2 wheeled Level of assistance: CGA Comments: flexed at hips, pushes RW too far ahead, unsteady on turns, short step lengths, limited ankle DF    TODAY'S TREATMENT: 01/30/2023 Therex: Nustep Lvl 6 5 mins, lvl 5 5 mins (10 mins total) UE/LE Leg press double leg 2 x 15 62 lbs Sit to stand to sit 18 inch chair UE on standing, minimal use of UE on lowering x 10 (cues for home) Seated marching x 10 bilateral (cues for home) Sitting heel/toe lift alternating x 10 (cues for home) Seated LAQ with pause in extension 2 seconds x 10 (cues for home)  Review of HEP plan c cues verbally, visually demo-ed  Neuro Re-ed: Walking forward with Rt UE on bar simulating cane use 10 ft x 8, reverse ambulation 10 ft x 4 c cues for increased step length Lateral stepping in // bars double hand on bar 10 ft x 3 each way with SBA    01/25/2023 NMR Semi-tandem stance 2x30 seconds B up to [place] for balance solid surface Narrow BOS EO progressing to EC 3x30 seconds solid surface  Standing marches no UEs x20 solid surface Lateral side steps along edge of mat table 3 rounds MinA Static standing with light external perturbations solid surface MinA  for balance Lateral side steps along edge of mat table with randomized AP perturbations x4 rounds   TherEx Seated hip ABD red TB x20 with 3 second holds (lots of compensations despite cues) Seated marches x15 each LE  U clamshells x15 B red TB  LAQs red TB x15 B  Ankle DF red TB x15 B   01/23/2023 Therex: Nustep Lvl 5 8 mins UE/LE Leg press double leg 2 x 15 62 lbs  Neuro Re-ed: Feet together stance eyes open 1 min x 2 c SBA to CGA in // bars Walking forward with Rt UE on bar simulating cane use 10 ft x 8, reverse ambulation 10 ft x 4 c cues for increased step length Lateral stepping in // bars double hand on bar 10 ft x 3 each way with SBA Alternating foot on 6 inch step in // bars Rt hand assist on bar c CGA to min A at times x 10 bilateral   PATIENT EDUCATION:  01/30/2023 Education details: HEP Person educated: Patient Education method: Consulting civil engineer, Media planner, and Handouts Education comprehension: verbalized understanding, returned demonstration, and needs further education  HOME EXERCISE PROGRAM: Access Code: [email or web address] URL: [email or web address] Date: 01/30/2023 Prepared by: Scot Jun  Exercises - Seated March  - 1-2 x daily - 7 x weekly - 1-2 sets - 10 reps - Sit to Stand  - 3 x daily - 7 x weekly - 1 sets - 10 reps - Seated Heel Toe Raises  - 1-2 x daily - 7 x weekly - 2-3 sets - 10-15 reps - Seated Long Arc Quad  - 3-5 x daily - 7 x weekly - 1-2 sets - 10 reps - 2 hold  ASSESSMENT:  CLINICAL IMPRESSION: Progressed to include sitting based HEP to promote improved ankle strategy and overall leg strength.  Cues for walker use and avoiding use of walker in transfers still required within clinic.  Pt  to benefit from skilled PT services at this time.    OBJECTIVE IMPAIRMENTS: Abnormal gait, decreased activity tolerance, decreased balance, decreased cognition, decreased coordination, decreased knowledge of use of DME, decreased mobility, difficulty  walking, decreased strength, decreased safety awareness, impaired sensation, improper body mechanics, and postural dysfunction.   ACTIVITY LIMITATIONS: standing, stairs, transfers, bed mobility, and locomotion level  PARTICIPATION LIMITATIONS: driving, shopping, community activity, yard work, and church  PERSONAL FACTORS: Age, Behavior pattern, Education, Fitness, Past/current experiences, Social background, and Time since onset of injury/illness/exacerbation are also affecting patient's functional outcome.   REHAB POTENTIAL: Fair limited progress with PT in the past, limited compliance with HEP in the past   CLINICAL DECISION MAKING: Evolving/moderate complexity  EVALUATION COMPLEXITY: Moderate   GOALS: Goals reviewed with patient? Yes  SHORT TERM GOALS: Target date: 02/13/2023   Will be compliant with appropriate progressive HEP with no more than MinA from spouse  Baseline: Goal status: on going 01/23/2023  2.  Will be able to hold tandem stance for 10 seconds with no UE support and no more than MinA  Baseline:  Goal status: on going 01/23/2023  3.  Will be able to hold SLS for 5 seconds with no UE support with no more than MinA  Baseline:  Goal status: on going 01/23/2023  4.  Will demonstrate improved gait mechanics to include upright posture, better proximity to LRAD, and improved ankle DF during gait  Baseline:  Goal status: on going 01/23/2023    LONG TERM GOALS: Target date: 03/13/2023    MMT in BLEs to improve by 1 grade in all weak groups  Baseline:  Goal status: INITIAL  2.  Will score at least 40 on [place] to show improved functional balance  Baseline:  Goal status: INITIAL  3.  Will complete 5x STS in 14 seconds or less to show improved mobility  Baseline:  Goal status: INITIAL  4.  Will be able to perform a shopping trip with LRAD with no increased fatigue and low fall risk in order to show improved community access  Baseline:  Goal status:  INITIAL    PLAN:  PT FREQUENCY: 2x/week  PT DURATION: 8 weeks  PLANNED INTERVENTIONS: Therapeutic exercises, Therapeutic activity, Neuromuscular re-education, Balance training, Gait training, Patient/Family education, Self Care, Joint mobilization, Stair training, DME instructions, Aquatic Therapy, Cryotherapy, Moist heat, Taping, Manual therapy, and Re-evaluation  PLAN FOR NEXT SESSION: static and dynamic balance as able.  LE strengthening.   Scot Jun, PT, DPT, OCS, ATC 01/30/23  3:00 PM

## 2023-02-01 ENCOUNTER — Ambulatory Visit (INDEPENDENT_AMBULATORY_CARE_PROVIDER_SITE_OTHER): Payer: Medicare Other | Admitting: Rehabilitative and Restorative Service Providers"

## 2023-02-01 ENCOUNTER — Encounter: Payer: Self-pay | Admitting: Rehabilitative and Restorative Service Providers"

## 2023-02-01 DIAGNOSIS — R2681 Unsteadiness on feet: Secondary | ICD-10-CM | POA: Diagnosis not present

## 2023-02-01 DIAGNOSIS — R262 Difficulty in walking, not elsewhere classified: Secondary | ICD-10-CM

## 2023-02-01 DIAGNOSIS — R2689 Other abnormalities of gait and mobility: Secondary | ICD-10-CM

## 2023-02-01 DIAGNOSIS — R296 Repeated falls: Secondary | ICD-10-CM | POA: Diagnosis not present

## 2023-02-01 DIAGNOSIS — M6281 Muscle weakness (generalized): Secondary | ICD-10-CM | POA: Diagnosis not present

## 2023-02-01 NOTE — Therapy (Signed)
OUTPATIENT PHYSICAL THERAPY TREATMENT   Patient Name: Jay Tran MRN: [id number] DOB:[birth year], 89 y.o., male Today's Date: 02/01/2023  END OF SESSION:  PT End of Session - 02/01/23 1443     Visit Number 5    Number of Visits 17    Date for PT Re-Evaluation 03/13/23    Authorization Type Medicare    Progress Note Due on Visit 10    PT Start Time 1505    PT Stop Time [id number]    PT Time Calculation (min) 39 min    Activity Tolerance Patient tolerated treatment well    Behavior During Therapy WFL for tasks assessed/performed                 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 (HCC)    Hx: of after 2013 back surgery   GERD (gastroesophageal reflux disease)    Hearing aid worn    Hx: of right ear   Hyperlipemia    Hypertension    Lumbar herniated disc    Lumbar stenosis    Macular degeneration right eye dry   PONV (postoperative nausea and vomiting)    takes iv nausea meds    Seasonal allergies    Hx: of   Stroke [facility])    Past Surgical History:  Procedure Laterality Date   BACK SURGERY  2013, x 3 with dr Ronnald Ramp 2014, 2016 and 2019   x 4, takes back injections monthly   COLONOSCOPY     Hx: of   ENDARTERECTOMY Right 02/07/2020   Procedure: ENDARTERECTOMY CAROTID;  Surgeon: Serafina Mitchell, MD;  Location: [place];  Service: Vascular;  Laterality: Right;   EYE SURGERY     bil catarcts 02/2016   HERNIA REPAIR  several yrs ago   inguinal   LUMBAR LAMINECTOMY  10/26/2012   Procedure: MICRODISCECTOMY LUMBAR LAMINECTOMY;  Surgeon: Marybelle Killings, MD;  Location: [place];  Service: Orthopedics;  Laterality: Right;  Right L4-5 Microdiscectomy   LUMBAR WOUND DEBRIDEMENT N/A 09/15/2021   Procedure: revision of thoracic incision, Irrigation and debridement of battery pocket wound;  Surgeon: Eustace Moore, MD;  Location: [place];   Service: Neurosurgery;  Laterality: N/A;   MAXIMUM ACCESS (MAS)POSTERIOR LUMBAR INTERBODY FUSION (PLIF) 1 LEVEL N/A 04/15/2015   Procedure: Maximum Access Surgery Posterior Lumbar Interbody Fusion Lumbar two-Lumbar three extension of hardware;  Surgeon: Eustace Moore, MD;  Location: [place] [facility];  Service: Neurosurgery;  Laterality: N/A;   PATCH ANGIOPLASTY Right 02/07/2020   Procedure: PATCH ANGIOPLASTY USING Rue[employer] Bash BIOLOGIC PATCH;  Surgeon: Serafina Mitchell, MD;  Location: [facility];  Service: Vascular;  Laterality: Right;   ROTATOR CUFF REPAIR     right shoulder - 3 times   SPINAL CORD STIMULATOR INSERTION  08/11/2021   TONSILLECTOMY  as child   TRANSURETHRAL RESECTION OF BLADDER TUMOR N/A 02/24/2017   Procedure: TRANSURETHRAL RESECTION OF BLADDER TUMOR (TURBT);  Surgeon: Alexis Frock, MD;  Location: [facility];  Service: Urology;  Laterality: N/A;   TRANSURETHRAL RESECTION OF PROSTATE N/A 11/20/2020   Procedure: TRANSURETHRAL RESECTION OF THE PROSTATE (TURP);  Surgeon: Alexis Frock, MD;  Location: [facility];  Service: Urology;  Laterality: N/A;   Patient Active Problem List   Diagnosis Date Noted   Unilateral primary osteoarthritis, right hip 03/30/2022  Prostatic hypertrophy 11/20/2020   Internal carotid artery stenosis 02/05/2020   Essential hypertension 02/05/2020   Benign prostatic hyperplasia with lower urinary tract symptoms 02/05/2020   GERD (gastroesophageal reflux disease) 02/05/2020   Chronic back pain 02/05/2020   Vision loss of right eye 02/04/2020   Wound drainage 08/20/2018   Chronic pain of both shoulders 02/22/2017   S/P lumbar spinal fusion 04/15/2015   HNP (herniated nucleus pulposus), lumbar 10/26/2012    Class: Diagnosis of    PCP: Leonard Downing   REFERRING PROVIDER: Vanetta Mulders, MD  REFERRING DIAG: 01/09/2023  THERAPY DIAG:  Unsteadiness on feet  Repeated falls  Muscle weakness (generalized)  Difficulty in walking, not  elsewhere classified  Other abnormalities of gait and mobility  Rationale for Evaluation and Treatment: Rehabilitation  ONSET DATE: 01/09/2023  SUBJECTIVE:   SUBJECTIVE STATEMENT: He indicated he has been busy  and hasn't "been able to do anything."    PERTINENT HISTORY:  PAIN:  NPRS scale: no specific pain.  Pain location:  Pain description: nothing specific  Aggravating factors:  Relieving factors:  PRECAUTIONS: Fall and Other: L shoulder pain, spinal cord stimulator, poor vision R eye, HOH     WEIGHT BEARING RESTRICTIONS: No  FALLS:  Has patient fallen in last 6 months? Yes. Number of falls "few falls in the past couple months"   LIVING ENVIRONMENT: Lives with: lives with their spouse Lives in: House/apartment Stairs: 3-4 STE U rail, stairs down to basement but does not use them  Has following equipment at home: Single point cane, Environmental consultant - 2 wheeled, Environmental consultant - 4 wheeled, Staples, Wheelchair (manual), shower chair, and Grab bars  OCCUPATION: retired   PLOF: Requires assistive device for independence  PATIENT GOALS: walk good again   NEXT MD VISIT: "last of the month, February"   OBJECTIVE:   DIAGNOSTIC FINDINGS:   PATIENT SURVEYS:  01/16/2023 FOTO 35, predicted 39  COGNITION: 01/16/2023 Overall cognitive status:  known STM impairments, 0/3 on 3 word recall, unable to perform serial 7s test without totalA       SENSATION: 01/16/2023 Impaired LTT B   EDEMA:   MUSCLE LENGTH:  POSTURE:   PALPATION:  LOWER EXTREMITY ROM:  Active ROM Right eval Left eval  Hip flexion    Hip extension    Hip abduction    Hip adduction    Hip internal rotation    Hip external rotation    Knee flexion    Knee extension    Ankle dorsiflexion    Ankle plantarflexion    Ankle inversion    Ankle eversion     (Blank rows = not tested)  LOWER EXTREMITY MMT:  MMT Right 01/16/2023 Left 01/16/2023 Right 01/30/2023 Left 01/30/2023  Hip flexion 3+ 3+ 5/5 5/5  Hip  extension      Hip abduction 3 tested in sitting  3 tested in sitting     Hip adduction      Hip internal rotation      Hip external rotation      Knee flexion 4 4- 5/5 5/5  Knee extension 4- 4 5/5 4/5  Ankle dorsiflexion 3+ 4    Ankle plantarflexion      Ankle inversion      Ankle eversion       (Blank rows = not tested)  LOWER EXTREMITY SPECIAL TESTS:    FUNCTIONAL TESTS:  02/01/2023:   TUG c FWW 35.20  01/16/2023 5 times sit to stand: 19.2 seconds no UEs  Balance-  narrow BOS 30 seconds min guard no UEs       tandem stance 1 second B no UEs SLS unable no UEs   GAIT: 01/30/2023:  Mod independent ambulation c FWW.  Continued pushed forward placement with forward trunk lean.    01/16/2023 Distance walked: in clinic distances  Assistive device utilized: Environmental consultant - 2 wheeled Level of assistance: CGA Comments: flexed at hips, pushes RW too far ahead, unsteady on turns, short step lengths, limited ankle DF    TODAY'S TREATMENT: 02/01/2023 Therex: Nustep Lvl 6 9 mins, lvl 5 3 mins UE/LE Leg press double leg 2 x 15 62 lbs TUG x 2 c FWW Standing in FWW heel raises x 20   Neuro Re-ed: Big inspired forward stepping c CGA with occasional min A x 15 bilateral Big inspired retro stepping c CGA with occasional min A x 15 bilateral Consistent verbal and visual cues throughout with additional time required for education   01/30/2023 Therex: Nustep Lvl 6 5 mins, lvl 5 5 mins (10 mins total) UE/LE Leg press double leg 2 x 15 62 lbs Sit to stand to sit 18 inch chair UE on standing, minimal use of UE on lowering x 10 (cues for home) Seated marching x 10 bilateral (cues for home) Sitting heel/toe lift alternating x 10 (cues for home) Seated LAQ with pause in extension 2 seconds x 10 (cues for home)  Review of HEP plan c cues verbally, visually demo-ed  Neuro Re-ed: Walking forward with Rt UE on bar simulating cane use 10 ft x 8, reverse ambulation 10 ft x 4 c cues for increased step  length Lateral stepping in // bars double hand on bar 10 ft x 3 each way with SBA    01/25/2023 NMR Semi-tandem stance 2x30 seconds B up to [place] for balance solid surface Narrow BOS EO progressing to EC 3x30 seconds solid surface  Standing marches no UEs x20 solid surface Lateral side steps along edge of mat table 3 rounds MinA Static standing with light external perturbations solid surface MinA for balance Lateral side steps along edge of mat table with randomized AP perturbations x4 rounds   TherEx Seated hip ABD red TB x20 with 3 second holds (lots of compensations despite cues) Seated marches x15 each LE  U clamshells x15 B red TB  LAQs red TB x15 B  Ankle DF red TB x15 B   01/23/2023 Therex: Nustep Lvl 5 8 mins UE/LE Leg press double leg 2 x 15 62 lbs  Neuro Re-ed: Feet together stance eyes open 1 min x 2 c SBA to CGA in // bars Walking forward with Rt UE on bar simulating cane use 10 ft x 8, reverse ambulation 10 ft x 4 c cues for increased step length Lateral stepping in // bars double hand on bar 10 ft x 3 each way with SBA Alternating foot on 6 inch step in // bars Rt hand assist on bar c CGA to min A at times x 10 bilateral   PATIENT EDUCATION:  01/30/2023 Education details: HEP Person educated: Patient Education method: Consulting civil engineer, Media planner, and Handouts Education comprehension: verbalized understanding, returned demonstration, and needs further education  HOME EXERCISE PROGRAM: Access Code: [email or web address] URL: [email or web address] Date: 01/30/2023 Prepared by: Scot Jun  Exercises - Seated March  - 1-2 x daily - 7 x weekly - 1-2 sets - 10 reps - Sit to Stand  - 3 x daily - 7 x weekly - 1 sets - 10 reps -  Seated Heel Toe Raises  - 1-2 x daily - 7 x weekly - 2-3 sets - 10-15 reps - Seated Long Arc Quad  - 3-5 x daily - 7 x weekly - 1-2 sets - 10 reps - 2 hold  ASSESSMENT:  CLINICAL IMPRESSION: Difficulty with weight shifting control in  standing activity with noted Lt leg weakness compared to Rt in movement.  Continued consistent cues required during all activity to improve safety and techniques.    OBJECTIVE IMPAIRMENTS: Abnormal gait, decreased activity tolerance, decreased balance, decreased cognition, decreased coordination, decreased knowledge of use of DME, decreased mobility, difficulty walking, decreased strength, decreased safety awareness, impaired sensation, improper body mechanics, and postural dysfunction.   ACTIVITY LIMITATIONS: standing, stairs, transfers, bed mobility, and locomotion level  PARTICIPATION LIMITATIONS: driving, shopping, community activity, yard work, and church  PERSONAL FACTORS: Age, Behavior pattern, Education, Fitness, Past/current experiences, Social background, and Time since onset of injury/illness/exacerbation are also affecting patient's functional outcome.   REHAB POTENTIAL: Fair limited progress with PT in the past, limited compliance with HEP in the past   CLINICAL DECISION MAKING: Evolving/moderate complexity  EVALUATION COMPLEXITY: Moderate   GOALS: Goals reviewed with patient? Yes  SHORT TERM GOALS: Target date: 02/13/2023   Will be compliant with appropriate progressive HEP with no more than MinA from spouse  Baseline: Goal status: on going 01/23/2023  2.  Will be able to hold tandem stance for 10 seconds with no UE support and no more than MinA  Baseline:  Goal status: on going 01/23/2023  3.  Will be able to hold SLS for 5 seconds with no UE support with no more than MinA  Baseline:  Goal status: on going 01/23/2023  4.  Will demonstrate improved gait mechanics to include upright posture, better proximity to LRAD, and improved ankle DF during gait  Baseline:  Goal status: on going 01/23/2023    LONG TERM GOALS: Target date: 03/13/2023    MMT in BLEs to improve by 1 grade in all weak groups  Baseline:  Goal status: INITIAL  2.  Will score at least 40 on [place] to  show improved functional balance  Baseline:  Goal status: INITIAL  3.  Will complete 5x STS in 14 seconds or less to show improved mobility  Baseline:  Goal status: INITIAL  4.  Will be able to perform a shopping trip with LRAD with no increased fatigue and low fall risk in order to show improved community access  Baseline:  Goal status: INITIAL    PLAN:  PT FREQUENCY: 2x/week  PT DURATION: 8 weeks  PLANNED INTERVENTIONS: Therapeutic exercises, Therapeutic activity, Neuromuscular re-education, Balance training, Gait training, Patient/Family education, Self Care, Joint mobilization, Stair training, DME instructions, Aquatic Therapy, Cryotherapy, Moist heat, Taping, Manual therapy, and Re-evaluation  PLAN FOR NEXT SESSION: static and dynamic balance progression. Endurance/strength gains.   Scot Jun, PT, DPT, OCS, ATC 02/01/23  3:51 PM

## 2023-02-06 ENCOUNTER — Ambulatory Visit (INDEPENDENT_AMBULATORY_CARE_PROVIDER_SITE_OTHER): Payer: Medicare Other | Admitting: Rehabilitative and Restorative Service Providers"

## 2023-02-06 ENCOUNTER — Encounter: Payer: Self-pay | Admitting: Rehabilitative and Restorative Service Providers"

## 2023-02-06 DIAGNOSIS — M6281 Muscle weakness (generalized): Secondary | ICD-10-CM

## 2023-02-06 DIAGNOSIS — R262 Difficulty in walking, not elsewhere classified: Secondary | ICD-10-CM | POA: Diagnosis not present

## 2023-02-06 DIAGNOSIS — R296 Repeated falls: Secondary | ICD-10-CM | POA: Diagnosis not present

## 2023-02-06 DIAGNOSIS — R2681 Unsteadiness on feet: Secondary | ICD-10-CM

## 2023-02-06 NOTE — Therapy (Signed)
OUTPATIENT PHYSICAL THERAPY TREATMENT   Patient Name: Jay Tran MRN: [id number] DOB:19-Aug-1934, 89 y.o., male Today's Date: 02/06/2023  END OF SESSION:  PT End of Session - 02/06/23 1414     Visit Number 6    Number of Visits 17    Date for PT Re-Evaluation 03/13/23    Authorization Type Medicare    Progress Note Due on Visit 10    PT Start Time 1414    PT Stop Time [id number]    PT Time Calculation (min) 40 min    Activity Tolerance Patient limited by fatigue    Behavior During Therapy [facility] for tasks assessed/performed                  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 (HCC)    Hx: of after 2013 back surgery   GERD (gastroesophageal reflux disease)    Hearing aid worn    Hx: of right ear   Hyperlipemia    Hypertension    Lumbar herniated disc    Lumbar stenosis    Macular degeneration right eye dry   PONV (postoperative nausea and vomiting)    takes iv nausea meds    Seasonal allergies    Hx: of   Stroke [facility])    Past Surgical History:  Procedure Laterality Date   BACK SURGERY  2013, x 3 with dr Ronnald Ramp 2014, 2016 and 2019   x 4, takes back injections monthly   COLONOSCOPY     Hx: of   ENDARTERECTOMY Right 02/07/2020   Procedure: ENDARTERECTOMY CAROTID;  Surgeon: Serafina Mitchell, MD;  Location: [place];  Service: Vascular;  Laterality: Right;   EYE SURGERY     bil catarcts 02/2016   HERNIA REPAIR  several yrs ago   inguinal   LUMBAR LAMINECTOMY  10/26/2012   Procedure: MICRODISCECTOMY LUMBAR LAMINECTOMY;  Surgeon: Marybelle Killings, MD;  Location: [place];  Service: Orthopedics;  Laterality: Right;  Right L4-5 Microdiscectomy   LUMBAR WOUND DEBRIDEMENT N/A 09/15/2021   Procedure: revision of thoracic incision, Irrigation and debridement of battery pocket wound;  Surgeon: Eustace Moore, MD;  Location: [place];   Service: Neurosurgery;  Laterality: N/A;   MAXIMUM ACCESS (MAS)POSTERIOR LUMBAR INTERBODY FUSION (PLIF) 1 LEVEL N/A 04/15/2015   Procedure: Maximum Access Surgery Posterior Lumbar Interbody Fusion Lumbar two-Lumbar three extension of hardware;  Surgeon: Eustace Moore, MD;  Location: [place] [facility];  Service: Neurosurgery;  Laterality: N/A;   PATCH ANGIOPLASTY Right 02/07/2020   Procedure: PATCH ANGIOPLASTY USING Rueben Bash BIOLOGIC PATCH;  Surgeon: Serafina Mitchell, MD;  Location: [facility];  Service: Vascular;  Laterality: Right;   ROTATOR CUFF REPAIR     right shoulder - 3 times   SPINAL CORD STIMULATOR INSERTION  08/11/2021   TONSILLECTOMY  as child   TRANSURETHRAL RESECTION OF BLADDER TUMOR N/A 02/24/2017   Procedure: TRANSURETHRAL RESECTION OF BLADDER TUMOR (TURBT);  Surgeon: Alexis Frock, MD;  Location: [facility];  Service: Urology;  Laterality: N/A;   TRANSURETHRAL RESECTION OF PROSTATE N/A 11/20/2020   Procedure: TRANSURETHRAL RESECTION OF THE PROSTATE (TURP);  Surgeon: Alexis Frock, MD;  [facility];  Service: Urology;  Laterality: N/A;   Patient Active Problem List   Diagnosis Date Noted   Unilateral primary osteoarthritis, right hip 03/30/2022  Prostatic hypertrophy 11/20/2020   Internal carotid artery stenosis 02/05/2020   Essential hypertension 02/05/2020   Benign prostatic hyperplasia with lower urinary tract symptoms 02/05/2020   GERD (gastroesophageal reflux disease) 02/05/2020   Chronic back pain 02/05/2020   Vision loss of right eye 02/04/2020   Wound drainage 08/20/2018   Chronic pain of both shoulders 02/22/2017   S/P lumbar spinal fusion 04/15/2015   HNP (herniated nucleus pulposus), lumbar 10/26/2012    Class: Diagnosis of    PCP: Leonard Downing   REFERRING PROVIDER: Vanetta Mulders, MD  REFERRING DIAG: 01/09/2023  THERAPY DIAG:  Unsteadiness on feet  Repeated falls  Muscle weakness (generalized)  Difficulty in walking, not  elsewhere classified  Rationale for Evaluation and Treatment: Rehabilitation  ONSET DATE: 01/09/2023  SUBJECTIVE:   SUBJECTIVE STATEMENT: Pt indicated he did "part" of his exercises.  After discussion, he stated he only did the exercises once "didn't have time".  He reported he did watch tv ( gave cues to do them while watching TV).    PERTINENT HISTORY:  PAIN:  NPRS scale: no specific pain.  Pain location:  Pain description: nothing specific  Aggravating factors:  Relieving factors:  PRECAUTIONS: Fall and Other: L shoulder pain, spinal cord stimulator, poor vision R eye, HOH     WEIGHT BEARING RESTRICTIONS: No  FALLS:  Has patient fallen in last 6 months? Yes. Number of falls "few falls in the past couple months"   LIVING ENVIRONMENT: Lives with: lives with their spouse Lives in: House/apartment Stairs: 3-4 STE U rail, stairs down to basement but does not use them  Has following equipment at home: Single point cane, Environmental consultant - 2 wheeled, Environmental consultant - 4 wheeled, [place], Wheelchair (manual), shower chair, and Grab bars  OCCUPATION: retired   PLOF: Requires assistive device for independence  PATIENT GOALS: walk good again   NEXT MD VISIT: "last of the month, February"   OBJECTIVE:   DIAGNOSTIC FINDINGS:   PATIENT SURVEYS:  01/16/2023 FOTO 25, predicted 69  COGNITION: 01/16/2023 Overall cognitive status:  known STM impairments, 0/3 on 3 word recall, unable to perform serial 7s test without totalA       SENSATION: 01/16/2023 Impaired LTT B   EDEMA:   MUSCLE LENGTH:  POSTURE:   PALPATION:  LOWER EXTREMITY ROM:  Active ROM Right eval Left eval  Hip flexion    Hip extension    Hip abduction    Hip adduction    Hip internal rotation    Hip external rotation    Knee flexion    Knee extension    Ankle dorsiflexion    Ankle plantarflexion    Ankle inversion    Ankle eversion     (Blank rows = not tested)  LOWER EXTREMITY MMT:  MMT Right 01/16/2023  Left 01/16/2023 Right 01/30/2023 Left 01/30/2023  Hip flexion 3+ 3+ 5/5 5/5  Hip extension      Hip abduction 3 tested in sitting  3 tested in sitting     Hip adduction      Hip internal rotation      Hip external rotation      Knee flexion 4 4- 5/5 5/5  Knee extension 4- 4 5/5 4/5  Ankle dorsiflexion 3+ 4    Ankle plantarflexion      Ankle inversion      Ankle eversion       (Blank rows = not tested)  LOWER EXTREMITY SPECIAL TESTS:    FUNCTIONAL TESTS:  02/01/2023:  TUG c FWW 35.20 seconds  01/16/2023 5 times sit to stand: 19.2 seconds no UEs  Balance- narrow BOS 30 seconds min guard no UEs       tandem stance 1 second B no UEs SLS unable no UEs   GAIT: 01/30/2023:  Mod independent ambulation c FWW.  Continued pushed forward placement with forward trunk lean.    01/16/2023 Distance walked: in clinic distances  Assistive device utilized: Environmental consultant - 2 wheeled Level of assistance: CGA Comments: flexed at hips, pushes RW too far ahead, unsteady on turns, short step lengths, limited ankle DF    TODAY'S TREATMENT: 02/06/2023 Therex: Nustep Lvl 5 UE/LE 12 mins  Step up 4 inch x 12 bilateral c bilateral hand rail assist and CGA Standing at bar bilateral HHA heel raises x 15  Rollator ambulation in clinic 100 ft x 2 c cues for keeping it close and upright posture for safety Sit to stand to sit throughout visit c cues for techniques and hand placement for safety , done x 10   Neuro Re-ed: Alternating toe taps 4 inch step x 12 bilateral c CGA Feet together stance 1 min c CGA to occasional min A Modified tandem small step forward 45 seconds x 1 bilateral c CGA to min A at times   Constant cues for techniques and awareness of activty  02/01/2023 Therex: Nustep Lvl 6 9 mins, lvl 5 3 mins UE/LE Leg press double leg 2 x 15 62 lbs TUG x 2 c FWW Standing in FWW heel raises x 20   Neuro Re-ed: Big inspired forward stepping c CGA with occasional min A x 15 bilateral Big  inspired retro stepping c CGA with occasional min A x 15 bilateral Consistent verbal and visual cues throughout with additional time required for education   01/30/2023 Therex: Nustep Lvl 6 5 mins, lvl 5 5 mins (10 mins total) UE/LE Leg press double leg 2 x 15 62 lbs Sit to stand to sit 18 inch chair UE on standing, minimal use of UE on lowering x 10 (cues for home) Seated marching x 10 bilateral (cues for home) Sitting heel/toe lift alternating x 10 (cues for home) Seated LAQ with pause in extension 2 seconds x 10 (cues for home)  Review of HEP plan c cues verbally, visually demo-ed  Neuro Re-ed: Walking forward with Rt UE on bar simulating cane use 10 ft x 8, reverse ambulation 10 ft x 4 c cues for increased step length Lateral stepping in // bars double hand on bar 10 ft x 3 each way with SBA   PATIENT EDUCATION:  01/30/2023 Education details: HEP Person educated: Patient Education method: Consulting civil engineer, Media planner, and Handouts Education comprehension: verbalized understanding, returned demonstration, and needs further education  HOME EXERCISE PROGRAM: Access Code: [email or web address] URL: [email or web address] Date: 01/30/2023 Prepared by: Scot Jun  Exercises - Seated March  - 1-2 x daily - 7 x weekly - 1-2 sets - 10 reps - Sit to Stand  - 3 x daily - 7 x weekly - 1 sets - 10 reps - Seated Heel Toe Raises  - 1-2 x daily - 7 x weekly - 2-3 sets - 10-15 reps - Seated Long Arc Quad  - 3-5 x daily - 7 x weekly - 1-2 sets - 10 reps - 2 hold  ASSESSMENT:  CLINICAL IMPRESSION: Continued cues required to accomplish desired movements.  Poor to fair use and sequencing with transfers and walker/rollator placement continued despite consistent cues.  Improvement will help improve  safety in mobility.  Fair performance on static balance interventions.  Continued skilled PT services to help progress and improve.    OBJECTIVE IMPAIRMENTS: Abnormal gait, decreased activity  tolerance, decreased balance, decreased cognition, decreased coordination, decreased knowledge of use of DME, decreased mobility, difficulty walking, decreased strength, decreased safety awareness, impaired sensation, improper body mechanics, and postural dysfunction.   ACTIVITY LIMITATIONS: standing, stairs, transfers, bed mobility, and locomotion level  PARTICIPATION LIMITATIONS: driving, shopping, community activity, yard work, and church  PERSONAL FACTORS: Age, Behavior pattern, Education, Fitness, Past/current experiences, Social background, and Time since onset of injury/illness/exacerbation are also affecting patient's functional outcome.   REHAB POTENTIAL: Fair limited progress with PT in the past, limited compliance with HEP in the past   CLINICAL DECISION MAKING: Evolving/moderate complexity  EVALUATION COMPLEXITY: Moderate   GOALS: Goals reviewed with patient? Yes  SHORT TERM GOALS: Target date: 02/13/2023   Will be compliant with appropriate progressive HEP with no more than MinA from spouse  Baseline: Goal status: on going 01/23/2023  2.  Will be able to hold tandem stance for 10 seconds with no UE support and no more than MinA  Baseline:  Goal status: on going 01/23/2023  3.  Will be able to hold SLS for 5 seconds with no UE support with no more than MinA  Baseline:  Goal status: on going 01/23/2023  4.  Will demonstrate improved gait mechanics to include upright posture, better proximity to LRAD, and improved ankle DF during gait  Baseline:  Goal status: on going 01/23/2023    LONG TERM GOALS: Target date: 03/13/2023    MMT in BLEs to improve by 1 grade in all weak groups  Baseline:  Goal status: INITIAL  2.  Will score at least 40 on Berg to show improved functional balance  Baseline:  Goal status: INITIAL  3.  Will complete 5x STS in 14 seconds or less to show improved mobility  Baseline:  Goal status: INITIAL  4.  Will be able to perform a shopping trip  with LRAD with no increased fatigue and low fall risk in order to show improved community access  Baseline:  Goal status: INITIAL    PLAN:  PT FREQUENCY: 2x/week  PT DURATION: 8 weeks  PLANNED INTERVENTIONS: Therapeutic exercises, Therapeutic activity, Neuromuscular re-education, Balance training, Gait training, Patient/Family education, Self Care, Joint mobilization, Stair training, DME instructions, Aquatic Therapy, Cryotherapy, Moist heat, Taping, Manual therapy, and Re-evaluation  PLAN FOR NEXT SESSION: static and dynamic balance progression as appropriate.    Scot Jun, PT, DPT, OCS, ATC 02/06/23  2:53 PM

## 2023-02-08 ENCOUNTER — Encounter: Payer: Self-pay | Admitting: Rehabilitative and Restorative Service Providers"

## 2023-02-08 ENCOUNTER — Ambulatory Visit (INDEPENDENT_AMBULATORY_CARE_PROVIDER_SITE_OTHER): Payer: Medicare Other | Admitting: Rehabilitative and Restorative Service Providers"

## 2023-02-08 DIAGNOSIS — M6281 Muscle weakness (generalized): Secondary | ICD-10-CM | POA: Diagnosis not present

## 2023-02-08 DIAGNOSIS — R2689 Other abnormalities of gait and mobility: Secondary | ICD-10-CM

## 2023-02-08 DIAGNOSIS — R296 Repeated falls: Secondary | ICD-10-CM

## 2023-02-08 DIAGNOSIS — R2681 Unsteadiness on feet: Secondary | ICD-10-CM | POA: Diagnosis not present

## 2023-02-08 DIAGNOSIS — R262 Difficulty in walking, not elsewhere classified: Secondary | ICD-10-CM | POA: Diagnosis not present

## 2023-02-08 NOTE — Therapy (Addendum)
OUTPATIENT PHYSICAL THERAPY TREATMENT /DISCHARGE   Patient Name: Jay Tran MRN: [id number] DOB:[birth year], 89 y.o., male Today's Date: 02/08/2023  END OF SESSION:  PT End of Session - 02/08/23 1429     Visit Number 7    Number of Visits 17    Date for PT Re-Evaluation 03/13/23    Authorization Type Medicare    Progress Note Due on Visit 10    PT Start Time 1422    PT Stop Time 1505    PT Time Calculation (min) 43 min    Activity Tolerance Patient limited by fatigue    Behavior During Therapy [facility] for tasks assessed/performed                   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 (HCC)    Hx: of after 2013 back surgery   GERD (gastroesophageal reflux disease)    Hearing aid worn    Hx: of right ear   Hyperlipemia    Hypertension    Lumbar herniated disc    Lumbar stenosis    Macular degeneration right eye dry   PONV (postoperative nausea and vomiting)    takes iv nausea meds    Seasonal allergies    Hx: of   Stroke [facility])    Past Surgical History:  Procedure Laterality Date   BACK SURGERY  2013, x 3 with dr Ronnald Ramp 2014, 2016 and 2019   x 4, takes back injections monthly   COLONOSCOPY     Hx: of   ENDARTERECTOMY Right 02/07/2020   Procedure: ENDARTERECTOMY CAROTID;  Surgeon: Serafina Mitchell, MD;  Location: [place];  Service: Vascular;  Laterality: Right;   EYE SURGERY     bil catarcts 02/2016   HERNIA REPAIR  several yrs ago   inguinal   LUMBAR LAMINECTOMY  10/26/2012   Procedure: MICRODISCECTOMY LUMBAR LAMINECTOMY;  Surgeon: Marybelle Killings, MD;  Location: [place];  Service: Orthopedics;  Laterality: Right;  Right L4-5 Microdiscectomy   LUMBAR WOUND DEBRIDEMENT N/A 09/15/2021   Procedure: revision of thoracic incision, Irrigation and debridement of battery pocket wound;  Surgeon: Eustace Moore, MD;  Location: [place];  Service: Neurosurgery;  Laterality: N/A;   MAXIMUM ACCESS (MAS)POSTERIOR LUMBAR INTERBODY FUSION (PLIF) 1 LEVEL N/A 04/15/2015   Procedure: Maximum Access Surgery Posterior Lumbar Interbody Fusion Lumbar two-Lumbar three extension of hardware;  Surgeon: Eustace Moore, MD;  Location: [place] [facility];  Service: Neurosurgery;  Laterality: N/A;   PATCH ANGIOPLASTY Right 02/07/2020   Procedure: PATCH ANGIOPLASTY USING Rueben Bash BIOLOGIC PATCH;  Surgeon: Serafina Mitchell, MD;  Location: [facility];  Service: Vascular;  Laterality: Right;   ROTATOR CUFF REPAIR     right shoulder - 3 times   SPINAL CORD STIMULATOR INSERTION  08/11/2021   TONSILLECTOMY  as child   TRANSURETHRAL RESECTION OF BLADDER TUMOR N/A 02/24/2017   Procedure: TRANSURETHRAL RESECTION OF BLADDER TUMOR (TURBT);  Surgeon: Alexis Frock, MD;  Location: [facility];  Service: Urology;  Laterality: N/A;   TRANSURETHRAL RESECTION OF PROSTATE N/A 11/20/2020   Procedure: TRANSURETHRAL RESECTION OF THE PROSTATE (TURP);  Surgeon: Alexis Frock, MD;  Location: [facility];  Service: Urology;  Laterality: N/A;   Patient Active Problem List   Diagnosis Date Noted   Unilateral primary osteoarthritis, right  hip 03/30/2022   Prostatic hypertrophy 11/20/2020   Internal carotid artery stenosis 02/05/2020   Essential hypertension 02/05/2020   Benign prostatic hyperplasia with lower urinary tract symptoms 02/05/2020   GERD (gastroesophageal reflux disease) 02/05/2020   Chronic back pain 02/05/2020   Vision loss of right eye 02/04/2020   Wound drainage 08/20/2018   Chronic pain of both shoulders 02/22/2017   S/P lumbar spinal fusion 04/15/2015   HNP (herniated nucleus pulposus), lumbar 10/26/2012    Class: Diagnosis of    PCP: Leonard Downing   REFERRING PROVIDER: Vanetta Mulders, MD  REFERRING DIAG: 01/09/2023  THERAPY DIAG:  Unsteadiness on feet  Repeated falls  Muscle weakness (generalized)  Difficulty in walking,  not elsewhere classified  Other abnormalities of gait and mobility  Rationale for Evaluation and Treatment: Rehabilitation  ONSET DATE: 01/09/2023  SUBJECTIVE:   SUBJECTIVE STATEMENT: He mentioned legs feel stronger than they have been.  He reported doing some walking in house with rollator or walker (using furniture/walls).    PERTINENT HISTORY:  PAIN:  NPRS scale: no specific pain.  Pain location:  Pain description: nothing specific  Aggravating factors:  Relieving factors:  PRECAUTIONS: Fall and Other: L shoulder pain, spinal cord stimulator, poor vision R eye, HOH     WEIGHT BEARING RESTRICTIONS: No  FALLS:  Has patient fallen in last 6 months? Yes. Number of falls "few falls in the past couple months"   LIVING ENVIRONMENT: Lives with: lives with their spouse Lives in: House/apartment Stairs: 3-4 STE U rail, stairs down to basement but does not use them  Has following equipment at home: Single point cane, Environmental consultant - 2 wheeled, Environmental consultant - 4 wheeled, [place], Wheelchair (manual), shower chair, and Grab bars  OCCUPATION: retired   PLOF: Requires assistive device for independence  PATIENT GOALS: walk good again   NEXT MD VISIT: "last of the month, February"   OBJECTIVE:   PATIENT SURVEYS:  01/16/2023 FOTO 90, predicted 73  COGNITION: 01/16/2023 Overall cognitive status:  known STM impairments, 0/3 on 3 word recall, unable to perform serial 7s test without totalA       SENSATION: 01/16/2023 Impaired LTT bilateral   EDEMA:   MUSCLE LENGTH:  POSTURE:   PALPATION:  LOWER EXTREMITY ROM:  Active ROM Right eval Left eval  Hip flexion    Hip extension    Hip abduction    Hip adduction    Hip internal rotation    Hip external rotation    Knee flexion    Knee extension    Ankle dorsiflexion    Ankle plantarflexion    Ankle inversion    Ankle eversion     (Blank rows = not tested)  LOWER EXTREMITY MMT:  MMT Right 01/16/2023 Left 01/16/2023  Right 01/30/2023 Left 01/30/2023  Hip flexion 3+ 3+ 5/5 5/5  Hip extension      Hip abduction 3 tested in sitting  3 tested in sitting     Hip adduction      Hip internal rotation      Hip external rotation      Knee flexion 4 4- 5/5 5/5  Knee extension 4- 4 5/5 4/5  Ankle dorsiflexion 3+ 4    Ankle plantarflexion      Ankle inversion      Ankle eversion       (Blank rows = not tested)  LOWER EXTREMITY SPECIAL TESTS:    FUNCTIONAL TESTS:  02/08/2023: Rollator 3 min walk test 210 ft c SBA.  Bent forward posture c shuffling gait with fatigue in distance.   02/01/2023:   TUG c FWW 35.20 seconds  01/16/2023 5 times sit to stand: 19.2 seconds no UEs  Balance- narrow BOS 30 seconds min guard no UEs       tandem stance 1 second B no UEs SLS unable no UEs   GAIT: 01/30/2023:  Mod independent ambulation c FWW.  Continued pushed forward placement with forward trunk lean.    01/16/2023 Distance walked: in clinic distances  Assistive device utilized: Environmental consultant - 2 wheeled Level of assistance: CGA Comments: flexed at hips, pushes RW too far ahead, unsteady on turns, short step lengths, limited ankle DF    TODAY'S TREATMENT:                                                 DATE: 02/08/2023 Therex: Nustep Lvl 6 UE/LE 6 mins , lvl 5 6 mins Seated PF/DF alternating bilateral  x20 Anterior hip strategy weight shift fwd off clinician leg support x 20 c constant cues  Neuro Re-ed: Alternating toe taps 4 inch step x 12 bilateral c CGA Feet together stance 1 min c CGA to occasional min A EO, EC 1 mins occasional min A tandem stance 1 min x 1 bilateral c moderate min A required Attempted feet together stance on foam but unable unassisted (posterior weight shift without able to correct)  Physical Performance Testing 3 min walk test with rest periods before and after c cues for performance and instruction.  Walked 210 ft c SBA and rollator   TODAY'S TREATMENT:                                                  DATE:02/06/2023 Therex: Nustep Lvl 5 UE/LE 12 mins  Step up 4 inch x 12 bilateral c bilateral hand rail assist and CGA Standing at bar bilateral HHA heel raises x 15  Rollator ambulation in clinic 100 ft x 2 c cues for keeping it close and upright posture for safety Sit to stand to sit throughout visit c cues for techniques and hand placement for safety , done x 10  Neuro Re-ed: Alternating toe taps 4 inch step x 12 bilateral c CGA Feet together stance 1 min c CGA to occasional min A Modified tandem small step forward 45 seconds x 1 bilateral c CGA to min A at times   Constant cues for techniques and awareness of activty  TODAY'S TREATMENT:                                                 DATE:02/01/2023 Therex: Nustep Lvl 6 9 mins, lvl 5 3 mins UE/LE Leg press double leg 2 x 15 62 lbs TUG x 2 c FWW Standing in FWW heel raises x 20  Neuro Re-ed: Big inspired forward stepping c CGA with occasional min A x 15 bilateral Big inspired retro stepping c CGA with occasional min A x 15 bilateral Consistent verbal and visual cues throughout with additional time required for education    PATIENT EDUCATION:  01/30/2023 Education details: HEP Person educated: Patient Education method: Consulting civil engineer, Media planner, and Handouts Education comprehension: verbalized understanding, returned demonstration, and needs further education  HOME EXERCISE PROGRAM: Access Code: [email or web address] URL: [email or web address] Date: 01/30/2023 Prepared by: Scot Jun  Exercises - Seated March  - 1-2 x daily - 7 x weekly - 1-2 sets - 10 reps - Sit to Stand  - 3 x daily - 7 x weekly - 1 sets - 10 reps - Seated Heel Toe Raises  - 1-2 x daily - 7 x weekly - 2-3 sets - 10-15 reps - Seated Long Arc Quad  - 3-5 x daily - 7 x weekly - 1-2 sets - 10 reps - 2 hold  ASSESSMENT:  CLINICAL IMPRESSION: Despite cues and constant education, Pt still demonstrated less safe techniques in ambulation  and transfers (ie : hands on walker when standing/sitting transfers, pushing walker out in front resulting in excessive forward trunk lean, trying to leave walker prior to being in place in front of chairs, not aware of brakes on walker).   Continued difficulty was noted in control of posterior weight displacement, noted in balance intervention and general transfers attempts.   Continued strengthening and balance improvements to help improve safety.    OBJECTIVE IMPAIRMENTS: Abnormal gait, decreased activity tolerance, decreased balance, decreased cognition, decreased coordination, decreased knowledge of use of DME, decreased mobility, difficulty walking, decreased strength, decreased safety awareness, impaired sensation, improper body mechanics, and postural dysfunction.   ACTIVITY LIMITATIONS: standing, stairs, transfers, bed mobility, and locomotion level  PARTICIPATION LIMITATIONS: driving, shopping, community activity, yard work, and church  PERSONAL FACTORS: Age, Behavior pattern, Education, Fitness, Past/current experiences, Social background, and Time since onset of injury/illness/exacerbation are also affecting patient's functional outcome.   REHAB POTENTIAL: Fair limited progress with PT in the past, limited compliance with HEP in the past   CLINICAL DECISION MAKING: Evolving/moderate complexity  EVALUATION COMPLEXITY: Moderate   GOALS: Goals reviewed with patient? Yes  SHORT TERM GOALS: Target date: 02/13/2023   Will be compliant with appropriate progressive HEP with no more than MinA from spouse  Baseline: Goal status: on going 01/23/2023  2.  Will be able to hold tandem stance for 10 seconds with no UE support and no more than MinA  Baseline:  Goal status: Met  3.  Will be able to hold SLS for 5 seconds with no UE support with no more than MinA  Baseline:  Goal status: on going 01/23/2023  4.  Will demonstrate improved gait mechanics to include upright posture, better  proximity to LRAD, and improved ankle DF during gait  Baseline:  Goal status: on going 01/23/2023    LONG TERM GOALS: Target date: 03/13/2023    MMT in BLEs to improve by 1 grade in all weak groups  Baseline:  Goal status: on going 02/08/2023  2.  Will score at least 40 on [place] to show improved functional balance  Baseline:  Goal status: on going 02/08/2023  3.  Will complete 5x STS in 14 seconds or less to show improved mobility  Baseline:  Goal status:on going 02/08/2023  4.  Will be able to perform a shopping trip with LRAD with no increased fatigue and low fall risk in order to show improved community access  Baseline:  Goal status:on going 02/08/2023    PLAN:  PT FREQUENCY: 2x/week  PT DURATION: 8 weeks  PLANNED INTERVENTIONS: Therapeutic exercises, Therapeutic activity, Neuromuscular re-education, Balance training, Gait training, Patient/Family education, Self Care, Joint mobilization, Stair training, DME  instructions, Aquatic Therapy, Cryotherapy, Moist heat, Taping, Manual therapy, and Re-evaluation  PLAN FOR NEXT SESSION: static and dynamic balance progression ambulation improvement techniques.    Scot Jun, PT, DPT, OCS, ATC 02/08/23  3:16 PM   PHYSICAL THERAPY DISCHARGE SUMMARY  Visits from Start of Care: 7  Current functional level related to goals / functional outcomes: See note   Remaining deficits: See note   Education / Equipment: HEP  Patient goals were partially met. Patient is being discharged due to  recent hospitalization.  Scot Jun, PT, DPT, OCS, ATC 03/16/23  1:38 PM

## 2023-02-13 ENCOUNTER — Encounter: Payer: Medicare Other | Admitting: Rehabilitative and Restorative Service Providers"

## 2023-02-15 ENCOUNTER — Encounter: Payer: Medicare Other | Admitting: Rehabilitative and Restorative Service Providers"

## 2023-02-16 ENCOUNTER — Encounter (INDEPENDENT_AMBULATORY_CARE_PROVIDER_SITE_OTHER): Payer: Medicare Other | Admitting: Ophthalmology

## 2023-02-16 DIAGNOSIS — H3411 Central retinal artery occlusion, right eye: Secondary | ICD-10-CM | POA: Diagnosis not present

## 2023-02-16 DIAGNOSIS — H353231 Exudative age-related macular degeneration, bilateral, with active choroidal neovascularization: Secondary | ICD-10-CM | POA: Diagnosis not present

## 2023-02-16 DIAGNOSIS — H35033 Hypertensive retinopathy, bilateral: Secondary | ICD-10-CM

## 2023-02-16 DIAGNOSIS — I10 Essential (primary) hypertension: Secondary | ICD-10-CM

## 2023-02-16 DIAGNOSIS — H43813 Vitreous degeneration, bilateral: Secondary | ICD-10-CM

## 2023-02-22 ENCOUNTER — Emergency Department (HOSPITAL_COMMUNITY): Payer: Medicare Other

## 2023-02-22 ENCOUNTER — Inpatient Hospital Stay (HOSPITAL_COMMUNITY)
Admission: EM | Admit: 2023-02-22 | Discharge: 2023-02-26 | DRG: 699 | Disposition: A | Discharge status: Skilled Nursing Facility | Payer: Medicare Other | Attending: Internal Medicine | Admitting: Internal Medicine

## 2023-02-22 ENCOUNTER — Observation Stay (HOSPITAL_COMMUNITY): Payer: Medicare Other

## 2023-02-22 ENCOUNTER — Other Ambulatory Visit: Payer: Self-pay | Admitting: *Deleted

## 2023-02-22 ENCOUNTER — Encounter (HOSPITAL_COMMUNITY): Payer: Self-pay

## 2023-02-22 ENCOUNTER — Other Ambulatory Visit: Payer: Self-pay

## 2023-02-22 DIAGNOSIS — N401 Enlarged prostate with lower urinary tract symptoms: Secondary | ICD-10-CM | POA: Diagnosis not present

## 2023-02-22 DIAGNOSIS — Z79899 Other long term (current) drug therapy: Secondary | ICD-10-CM

## 2023-02-22 DIAGNOSIS — N182 Chronic kidney disease, stage 2 (mild): Secondary | ICD-10-CM | POA: Diagnosis present

## 2023-02-22 DIAGNOSIS — Z888 Allergy status to other drugs, medicaments and biological substances status: Secondary | ICD-10-CM

## 2023-02-22 DIAGNOSIS — Z1152 Encounter for screening for COVID-19: Secondary | ICD-10-CM

## 2023-02-22 DIAGNOSIS — E876 Hypokalemia: Secondary | ICD-10-CM | POA: Diagnosis present

## 2023-02-22 DIAGNOSIS — Z6829 Body mass index (BMI) 29.0-29.9, adult: Secondary | ICD-10-CM

## 2023-02-22 DIAGNOSIS — Z9079 Acquired absence of other genital organ(s): Secondary | ICD-10-CM

## 2023-02-22 DIAGNOSIS — T83511A Infection and inflammatory reaction due to indwelling urethral catheter, initial encounter: Principal | ICD-10-CM | POA: Diagnosis present

## 2023-02-22 DIAGNOSIS — I10 Essential (primary) hypertension: Secondary | ICD-10-CM

## 2023-02-22 DIAGNOSIS — R6520 Severe sepsis without septic shock: Secondary | ICD-10-CM

## 2023-02-22 DIAGNOSIS — A419 Sepsis, unspecified organism: Secondary | ICD-10-CM | POA: Diagnosis not present

## 2023-02-22 DIAGNOSIS — Z885 Allergy status to narcotic agent status: Secondary | ICD-10-CM

## 2023-02-22 DIAGNOSIS — E871 Hypo-osmolality and hyponatremia: Secondary | ICD-10-CM | POA: Diagnosis present

## 2023-02-22 DIAGNOSIS — Z7982 Long term (current) use of aspirin: Secondary | ICD-10-CM

## 2023-02-22 DIAGNOSIS — R001 Bradycardia, unspecified: Secondary | ICD-10-CM | POA: Diagnosis present

## 2023-02-22 DIAGNOSIS — N179 Acute kidney failure, unspecified: Secondary | ICD-10-CM | POA: Diagnosis present

## 2023-02-22 DIAGNOSIS — E041 Nontoxic single thyroid nodule: Secondary | ICD-10-CM | POA: Diagnosis present

## 2023-02-22 DIAGNOSIS — I351 Nonrheumatic aortic (valve) insufficiency: Secondary | ICD-10-CM | POA: Diagnosis present

## 2023-02-22 DIAGNOSIS — K5900 Constipation, unspecified: Secondary | ICD-10-CM | POA: Diagnosis present

## 2023-02-22 DIAGNOSIS — J302 Other seasonal allergic rhinitis: Secondary | ICD-10-CM | POA: Diagnosis present

## 2023-02-22 DIAGNOSIS — G8929 Other chronic pain: Secondary | ICD-10-CM | POA: Diagnosis present

## 2023-02-22 DIAGNOSIS — N3001 Acute cystitis with hematuria: Secondary | ICD-10-CM | POA: Diagnosis present

## 2023-02-22 DIAGNOSIS — Z961 Presence of intraocular lens: Secondary | ICD-10-CM | POA: Diagnosis present

## 2023-02-22 DIAGNOSIS — Z9682 Presence of neurostimulator: Secondary | ICD-10-CM

## 2023-02-22 DIAGNOSIS — N390 Urinary tract infection, site not specified: Secondary | ICD-10-CM

## 2023-02-22 DIAGNOSIS — Z823 Family history of stroke: Secondary | ICD-10-CM

## 2023-02-22 DIAGNOSIS — N189 Chronic kidney disease, unspecified: Secondary | ICD-10-CM

## 2023-02-22 DIAGNOSIS — E785 Hyperlipidemia, unspecified: Secondary | ICD-10-CM | POA: Diagnosis present

## 2023-02-22 DIAGNOSIS — H9190 Unspecified hearing loss, unspecified ear: Secondary | ICD-10-CM | POA: Diagnosis present

## 2023-02-22 DIAGNOSIS — Z87891 Personal history of nicotine dependence: Secondary | ICD-10-CM

## 2023-02-22 DIAGNOSIS — M549 Dorsalgia, unspecified: Secondary | ICD-10-CM | POA: Diagnosis present

## 2023-02-22 DIAGNOSIS — R260 Ataxic gait: Secondary | ICD-10-CM | POA: Diagnosis present

## 2023-02-22 DIAGNOSIS — Z86718 Personal history of other venous thrombosis and embolism: Secondary | ICD-10-CM

## 2023-02-22 DIAGNOSIS — M1990 Unspecified osteoarthritis, unspecified site: Secondary | ICD-10-CM | POA: Diagnosis present

## 2023-02-22 DIAGNOSIS — R55 Syncope and collapse: Secondary | ICD-10-CM | POA: Diagnosis present

## 2023-02-22 DIAGNOSIS — M545 Low back pain, unspecified: Secondary | ICD-10-CM

## 2023-02-22 DIAGNOSIS — W19XXXA Unspecified fall, initial encounter: Secondary | ICD-10-CM | POA: Diagnosis present

## 2023-02-22 DIAGNOSIS — Z8673 Personal history of transient ischemic attack (TIA), and cerebral infarction without residual deficits: Secondary | ICD-10-CM

## 2023-02-22 DIAGNOSIS — I129 Hypertensive chronic kidney disease with stage 1 through stage 4 chronic kidney disease, or unspecified chronic kidney disease: Secondary | ICD-10-CM | POA: Diagnosis present

## 2023-02-22 DIAGNOSIS — K219 Gastro-esophageal reflux disease without esophagitis: Secondary | ICD-10-CM | POA: Diagnosis present

## 2023-02-22 DIAGNOSIS — E663 Overweight: Secondary | ICD-10-CM | POA: Diagnosis present

## 2023-02-22 DIAGNOSIS — R197 Diarrhea, unspecified: Secondary | ICD-10-CM | POA: Diagnosis not present

## 2023-02-22 DIAGNOSIS — R338 Other retention of urine: Secondary | ICD-10-CM

## 2023-02-22 DIAGNOSIS — Y846 Urinary catheterization as the cause of abnormal reaction of the patient, or of later complication, without mention of misadventure at the time of the procedure: Secondary | ICD-10-CM | POA: Diagnosis present

## 2023-02-22 DIAGNOSIS — S51012A Laceration without foreign body of left elbow, initial encounter: Secondary | ICD-10-CM | POA: Diagnosis present

## 2023-02-22 DIAGNOSIS — N3000 Acute cystitis without hematuria: Secondary | ICD-10-CM | POA: Diagnosis present

## 2023-02-22 DIAGNOSIS — Z974 Presence of external hearing-aid: Secondary | ICD-10-CM

## 2023-02-22 DIAGNOSIS — I77810 Thoracic aortic ectasia: Secondary | ICD-10-CM | POA: Diagnosis present

## 2023-02-22 DIAGNOSIS — T502X5A Adverse effect of carbonic-anhydrase inhibitors, benzothiadiazides and other diuretics, initial encounter: Secondary | ICD-10-CM | POA: Diagnosis present

## 2023-02-22 LAB — CBC WITH DIFFERENTIAL/PLATELET
Abs Immature Granulocytes: 0.45 10*3/uL — ABNORMAL HIGH (ref 0.00–0.07)
Basophils Absolute: 0.1 10*3/uL (ref 0.0–0.1)
Basophils Relative: 0 %
Eosinophils Absolute: 0.1 10*3/uL (ref 0.0–0.5)
Eosinophils Relative: 0 %
HCT: 37.9 % — ABNORMAL LOW (ref 39.0–52.0)
Hemoglobin: 13.2 g/dL (ref 13.0–17.0)
Immature Granulocytes: 2 %
Lymphocytes Relative: 3 %
Lymphs Abs: 0.9 10*3/uL (ref 0.7–4.0)
MCH: 33.4 pg (ref 26.0–34.0)
MCHC: 34.8 g/dL (ref 30.0–36.0)
MCV: 95.9 fL (ref 80.0–100.0)
Monocytes Absolute: 1.7 10*3/uL — ABNORMAL HIGH (ref 0.1–1.0)
Monocytes Relative: 6 %
Neutro Abs: 27 10*3/uL — ABNORMAL HIGH (ref 1.7–7.7)
Neutrophils Relative %: 89 %
Platelets: 352 10*3/uL (ref 150–400)
RBC: 3.95 MIL/uL — ABNORMAL LOW (ref 4.22–5.81)
RDW: 13.4 % (ref 11.5–15.5)
WBC: 30.2 10*3/uL — ABNORMAL HIGH (ref 4.0–10.5)
nRBC: 0 % (ref 0.0–0.2)

## 2023-02-22 LAB — DIFFERENTIAL
Abs Immature Granulocytes: 0.14 10*3/uL — ABNORMAL HIGH (ref 0.00–0.07)
Basophils Absolute: 0.1 10*3/uL (ref 0.0–0.1)
Basophils Relative: 0 %
Eosinophils Absolute: 0.1 10*3/uL (ref 0.0–0.5)
Eosinophils Relative: 0 %
Immature Granulocytes: 1 %
Lymphocytes Relative: 5 %
Lymphs Abs: 1 10*3/uL (ref 0.7–4.0)
Monocytes Absolute: 1.6 10*3/uL — ABNORMAL HIGH (ref 0.1–1.0)
Monocytes Relative: 7 %
Neutro Abs: 18.5 10*3/uL — ABNORMAL HIGH (ref 1.7–7.7)
Neutrophils Relative %: 87 %

## 2023-02-22 LAB — URINALYSIS, W/ REFLEX TO CULTURE (INFECTION SUSPECTED)
Bilirubin Urine: NEGATIVE
Glucose, UA: NEGATIVE mg/dL
Ketones, ur: NEGATIVE mg/dL
Nitrite: NEGATIVE
Protein, ur: 100 mg/dL — AB
RBC / HPF: >50 RBC/hpf (ref 0–5)
Specific Gravity, Urine: 1.019 (ref 1.005–1.030)
WBC, UA: >50 WBC/hpf (ref 0–5)
pH: 5 (ref 5.0–8.0)

## 2023-02-22 LAB — BASIC METABOLIC PANEL
Anion gap: 16 — ABNORMAL HIGH (ref 5–15)
Anion gap: 10 (ref 5–15)
BUN: 25 mg/dL — ABNORMAL HIGH (ref 8–23)
BUN: 22 mg/dL (ref 8–23)
CO2: 22 mmol/L (ref 22–32)
CO2: 25 mmol/L (ref 22–32)
Calcium: 9.2 mg/dL (ref 8.9–10.3)
Calcium: 8.5 mg/dL — ABNORMAL LOW (ref 8.9–10.3)
Chloride: 93 mmol/L — ABNORMAL LOW (ref 98–111)
Chloride: 97 mmol/L — ABNORMAL LOW (ref 98–111)
Creatinine, Ser: 1.89 mg/dL — ABNORMAL HIGH (ref 0.61–1.24)
Creatinine, Ser: 1.27 mg/dL — ABNORMAL HIGH (ref 0.61–1.24)
GFR, Estimated: 34 mL/min — ABNORMAL LOW (ref >60)
GFR, Estimated: 55 mL/min — ABNORMAL LOW (ref >60)
Glucose, Bld: 137 mg/dL — ABNORMAL HIGH (ref 70–99)
Glucose, Bld: 124 mg/dL — ABNORMAL HIGH (ref 70–99)
Potassium: 3.4 mmol/L — ABNORMAL LOW (ref 3.5–5.1)
Potassium: 4.3 mmol/L (ref 3.5–5.1)
Sodium: 131 mmol/L — ABNORMAL LOW (ref 135–145)
Sodium: 132 mmol/L — ABNORMAL LOW (ref 135–145)

## 2023-02-22 LAB — TECHNOLOGIST SMEAR REVIEW: WBC MORPHOLOGY: INCREASED

## 2023-02-22 LAB — CBC
HCT: 33.8 % — ABNORMAL LOW (ref 39.0–52.0)
Hemoglobin: 11.3 g/dL — ABNORMAL LOW (ref 13.0–17.0)
MCH: 32.5 pg (ref 26.0–34.0)
MCHC: 33.4 g/dL (ref 30.0–36.0)
MCV: 97.1 fL (ref 80.0–100.0)
Platelets: 313 10*3/uL (ref 150–400)
RBC: 3.48 MIL/uL — ABNORMAL LOW (ref 4.22–5.81)
RDW: 13.5 % (ref 11.5–15.5)
WBC: 21.3 10*3/uL — ABNORMAL HIGH (ref 4.0–10.5)
nRBC: 0 % (ref 0.0–0.2)

## 2023-02-22 LAB — HEPATIC FUNCTION PANEL
ALT: 7 U/L (ref 0–44)
AST: 27 U/L (ref 15–41)
Albumin: 3.5 g/dL (ref 3.5–5.0)
Alkaline Phosphatase: 54 U/L (ref 38–126)
Bilirubin, Direct: 0.2 mg/dL (ref 0.0–0.2)
Indirect Bilirubin: 0.7 mg/dL (ref 0.3–0.9)
Total Bilirubin: 0.9 mg/dL (ref 0.3–1.2)
Total Protein: 6.8 g/dL (ref 6.5–8.1)

## 2023-02-22 LAB — RESP PANEL BY RT-PCR (RSV, FLU A&B, COVID)  RVPGX2
Influenza A by PCR: NEGATIVE
Influenza B by PCR: NEGATIVE
Resp Syncytial Virus by PCR: NEGATIVE
SARS Coronavirus 2 by RT PCR: NEGATIVE

## 2023-02-22 LAB — TROPONIN I (HIGH SENSITIVITY)
Troponin I (High Sensitivity): 14 ng/L (ref <18)
Troponin I (High Sensitivity): 14 ng/L (ref <18)

## 2023-02-22 LAB — LACTIC ACID, PLASMA
Lactic Acid, Venous: 2.3 mmol/L (ref 0.5–1.9)
Lactic Acid, Venous: 1.9 mmol/L (ref 0.5–1.9)
Lactic Acid, Venous: 2 mmol/L (ref 0.5–1.9)

## 2023-02-22 LAB — BRAIN NATRIURETIC PEPTIDE: B Natriuretic Peptide: 97.3 pg/mL (ref 0.0–100.0)

## 2023-02-22 LAB — MAGNESIUM: Magnesium: 1.8 mg/dL (ref 1.7–2.4)

## 2023-02-22 MED ORDER — LACTATED RINGERS IV SOLN: INTRAVENOUS | Status: DC

## 2023-02-22 MED ORDER — SODIUM CHLORIDE 0.9 % IV SOLN
1.0000 g | INTRAVENOUS | Status: DC
  Administered 2023-02-22 – 2023-02-23 (×2): 1 g via INTRAVENOUS
  Filled 2023-02-22 (×2): qty 10

## 2023-02-22 MED ORDER — HYDRALAZINE HCL 20 MG/ML IJ SOLN
10.0000 mg | Freq: Four times a day (QID) | INTRAMUSCULAR | Status: DC | PRN
  Administered 2023-02-22 – 2023-02-24 (×4): 10 mg via INTRAVENOUS
  Filled 2023-02-22 (×5): qty 1

## 2023-02-22 MED ORDER — ONDANSETRON HCL 4 MG PO TABS: 4.0000 mg | ORAL_TABLET | Freq: Four times a day (QID) | ORAL | Status: DC | PRN

## 2023-02-22 MED ORDER — SODIUM CHLORIDE 0.9 % IV SOLN: 2.0000 g | INTRAVENOUS | Status: DC

## 2023-02-22 MED ORDER — LOPERAMIDE HCL 2 MG PO CAPS
2.0000 mg | ORAL_CAPSULE | Freq: Three times a day (TID) | ORAL | Status: DC | PRN
  Administered 2023-02-22 – 2023-02-25 (×4): 2 mg via ORAL
  Filled 2023-02-22 (×5): qty 1

## 2023-02-22 MED ORDER — ATORVASTATIN CALCIUM 40 MG PO TABS
40.0000 mg | ORAL_TABLET | Freq: Every day | ORAL | Status: DC
  Administered 2023-02-22 – 2023-02-25 (×4): 40 mg via ORAL
  Filled 2023-02-22 (×4): qty 1

## 2023-02-22 MED ORDER — LACTATED RINGERS IV BOLUS
1000.0000 mL | Freq: Once | INTRAVENOUS | Status: AC
  Administered 2023-02-22: 1000 mL via INTRAVENOUS

## 2023-02-22 MED ORDER — POTASSIUM CHLORIDE CRYS ER 20 MEQ PO TBCR
40.0000 meq | EXTENDED_RELEASE_TABLET | Freq: Once | ORAL | Status: AC
  Administered 2023-02-22: 40 meq via ORAL
  Filled 2023-02-22: qty 2

## 2023-02-22 MED ORDER — HEPARIN SODIUM (PORCINE) 5000 UNIT/ML IJ SOLN
5000.0000 [IU] | Freq: Three times a day (TID) | INTRAMUSCULAR | Status: DC
  Administered 2023-02-22 – 2023-02-26 (×13): 5000 [IU] via SUBCUTANEOUS
  Filled 2023-02-22 (×12): qty 1

## 2023-02-22 MED ORDER — VANCOMYCIN HCL IN DEXTROSE 1-5 GM/200ML-% IV SOLN: 1000.0000 mg | Freq: Once | INTRAVENOUS | Status: DC

## 2023-02-22 MED ORDER — PHENYLEPHRINE-MINERAL OIL-PET 0.25-14-74.9 % RE OINT
1.0000 | TOPICAL_OINTMENT | Freq: Two times a day (BID) | RECTAL | Status: DC | PRN
  Administered 2023-02-25 – 2023-02-26 (×2): 1 via RECTAL
  Filled 2023-02-22: qty 57

## 2023-02-22 MED ORDER — POLYVINYL ALCOHOL 1.4 % OP SOLN: 1.0000 [drp] | Freq: Three times a day (TID) | OPHTHALMIC | Status: DC | PRN

## 2023-02-22 MED ORDER — METRONIDAZOLE 500 MG/100ML IV SOLN
500.0000 mg | Freq: Once | INTRAVENOUS | Status: AC
  Administered 2023-02-22: 500 mg via INTRAVENOUS
  Filled 2023-02-22: qty 100

## 2023-02-22 MED ORDER — VANCOMYCIN HCL 2000 MG/400ML IV SOLN
2000.0000 mg | Freq: Once | INTRAVENOUS | Status: AC
  Administered 2023-02-22: 2000 mg via INTRAVENOUS
  Filled 2023-02-22: qty 400

## 2023-02-22 MED ORDER — TAMSULOSIN HCL 0.4 MG PO CAPS
0.4000 mg | ORAL_CAPSULE | Freq: Every day | ORAL | Status: DC
  Administered 2023-02-22 – 2023-02-25 (×4): 0.4 mg via ORAL
  Filled 2023-02-22 (×4): qty 1

## 2023-02-22 MED ORDER — ACETAMINOPHEN 650 MG RE SUPP: 650.0000 mg | Freq: Four times a day (QID) | RECTAL | Status: DC | PRN

## 2023-02-22 MED ORDER — TIZANIDINE HCL 4 MG PO TABS
4.0000 mg | ORAL_TABLET | Freq: Three times a day (TID) | ORAL | Status: DC | PRN
  Administered 2023-02-22 – 2023-02-25 (×5): 4 mg via ORAL
  Filled 2023-02-22 (×5): qty 1

## 2023-02-22 MED ORDER — ACETAMINOPHEN 325 MG PO TABS
650.0000 mg | ORAL_TABLET | Freq: Four times a day (QID) | ORAL | Status: DC | PRN
  Administered 2023-02-22 – 2023-02-25 (×10): 650 mg via ORAL
  Filled 2023-02-22 (×10): qty 2

## 2023-02-22 MED ORDER — SODIUM CHLORIDE 0.9 % IV SOLN
2.0000 g | Freq: Every day | INTRAVENOUS | Status: DC
  Administered 2023-02-22: 2 g via INTRAVENOUS
  Filled 2023-02-22: qty 12.5

## 2023-02-22 MED ORDER — ONDANSETRON HCL 4 MG/2ML IJ SOLN: 4.0000 mg | Freq: Four times a day (QID) | INTRAMUSCULAR | Status: DC | PRN

## 2023-02-22 MED ORDER — FINASTERIDE 5 MG PO TABS
5.0000 mg | ORAL_TABLET | Freq: Every day | ORAL | Status: DC
  Administered 2023-02-22 – 2023-02-25 (×4): 5 mg via ORAL
  Filled 2023-02-22 (×4): qty 1

## 2023-02-22 MED ORDER — SODIUM CHLORIDE 0.9 % IV SOLN: 2.0000 g | Freq: Once | INTRAVENOUS | Status: DC

## 2023-02-22 MED ORDER — VANCOMYCIN HCL 500 MG/100ML IV SOLN: 500.0000 mg | Freq: Two times a day (BID) | INTRAVENOUS | Status: DC

## 2023-02-22 NOTE — H&P (Signed)
History and Physical    Jay Tran [id number] DOB: [birth year] DOA: 02/22/2023  DOS: the patient was seen and examined on 02/22/2023  PCP: Leonard Downing, MD   Patient coming from: Home  I have personally briefly reviewed patient's old medical records in [place]  CC: fall at home HPI: 89 year old male history of BPH, status post TURP, chronic back pain, hypertension who presents to the ER today after a fall at home.  He states that he has had a Foley catheter in for about a week.  Has been on p.o. Cipro.  He went to the urology office during the daytime to have the Foley catheter removed.  He was unable to urinate afterwards.  Foley catheter was replaced.  He was started on Flomax.  They picked up the prescription.  Patient's been constipated for several days has been taking MiraLAX.  He went to bed very early because he was tired.  He took an extra dose of Zanaflex.  He took 8 mg instead of 4 mg.  About 9 or 10:00 in the evening he felt a sudden urge to have a bowel movement.  He stood up from bed and subsequently fell over.  He had fecal incontinence/diarrhea.  His wife had to call 9 1 to get him up.  He was brought to the ER.  Upon further questioning, the patient is been taking Benadryl twice a day.  This seems to coincide with the patient's urinary retention.  Advised him to stop Benadryl.  On arrival temp 98.6 heart rate 69 blood pressure 129/59.  Labs showed a white count of 30.2, hemoglobin 13.2, platelets of 352  Sodium 131, potassium 3.4, BUN 25, creatinine 1.89, glucose 138  Lactic acid elevated 2.3  UA showed large cassette esterase, greater than 50 WBCs. CT chest abdomen pelvis showed cholelithiasis. No other intra abdominal or thoracic acute processes noted.  \CT head shows atrophy and small vessel ischemic disease. C-spine x-ray showed advanced disc disease. Left elbow x-ray was negative.  [facility] contacted for admission.   ED Course: CT  head negative. WBC 30K, lactic acid 2.3, Scr 1.89  Review of Systems:  Review of Systems  Constitutional: Negative.  Negative for chills and fever.  HENT: Negative.    Eyes: Negative.   Respiratory: Negative.    Cardiovascular: Negative.   Gastrointestinal:  Positive for diarrhea.  Genitourinary:        Urinary retention  Musculoskeletal:  Positive for back pain and falls.       Chronic back pain  Skin: Negative.   Neurological: Negative.   Endo/Heme/Allergies: Negative.   Psychiatric/Behavioral: Negative.    All other systems reviewed and are negative.   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 (HCC)    Hx: of after 2013 back surgery   GERD (gastroesophageal reflux disease)    Hearing aid worn    Hx: of right ear   Hyperlipemia    Hypertension    Lumbar herniated disc    Lumbar stenosis    Macular degeneration right eye dry   PONV (postoperative nausea and vomiting)    takes iv nausea meds    Seasonal allergies    Hx: of   Stroke [facility])     Past Surgical History:  Procedure Laterality Date   BACK SURGERY  2013, x 3 with dr Ronnald Ramp 2014, 2016 and 2019   x 4, takes back injections monthly   COLONOSCOPY     Hx: of   ENDARTERECTOMY Right 02/07/2020   Procedure: ENDARTERECTOMY CAROTID;  Surgeon: Serafina Mitchell, MD;  Location: [place];  Service: Vascular;  Laterality: Right;   EYE SURGERY     bil catarcts 02/2016   HERNIA REPAIR  several yrs ago   inguinal   LUMBAR LAMINECTOMY  10/26/2012   Procedure: MICRODISCECTOMY LUMBAR LAMINECTOMY;  Surgeon: Marybelle Killings, MD;  Location: [place];  Service: Orthopedics;  Laterality: Right;  Right L4-5 Microdiscectomy   LUMBAR WOUND DEBRIDEMENT N/A 09/15/2021   Procedure: revision of thoracic incision, Irrigation and debridement of battery pocket wound;  Surgeon: Eustace Moore, MD;  Location: [place];  Service: Neurosurgery;  Laterality: N/A;   MAXIMUM ACCESS (MAS)POSTERIOR LUMBAR INTERBODY FUSION (PLIF) 1 LEVEL N/A 04/15/2015   Procedure: Maximum Access Surgery Posterior Lumbar Interbody Fusion Lumbar two-Lumbar three extension of hardware;  Surgeon: Eustace Moore, MD;  Location: [place] [facility];  Service: Neurosurgery;  Laterality: N/A;   PATCH ANGIOPLASTY Right 02/07/2020   Procedure: PATCH ANGIOPLASTY USING Rueben Bash BIOLOGIC PATCH;  Surgeon: Serafina Mitchell, MD;  Location: [facility];  Service: Vascular;  Laterality: Right;   ROTATOR CUFF REPAIR     right shoulder - 3 times   SPINAL CORD STIMULATOR INSERTION  08/11/2021   TONSILLECTOMY  as child   TRANSURETHRAL RESECTION OF BLADDER TUMOR N/A 02/24/2017   Procedure: TRANSURETHRAL RESECTION OF BLADDER TUMOR (TURBT);  Surgeon: Alexis Frock, MD;  Location: [facility];  Service: Urology;  Laterality: N/A;   TRANSURETHRAL RESECTION OF PROSTATE N/A 11/20/2020   Procedure: TRANSURETHRAL RESECTION OF THE PROSTATE (TURP);  Surgeon: Alexis Frock, MD;  Location: [facility];  Service: Urology;  Laterality: N/A;     reports that he quit smoking about 59 years ago. His smoking use included cigarettes. He has a 15.00 pack-year smoking history. He has never used smokeless tobacco. He reports that he does not drink alcohol and does not use drugs.  Allergies  Allergen Reactions   Codeine Itching and Nausea And Vomiting   Other Nausea Only    Anesthesia   Tramadol Nausea Only    Family History  Problem Relation Age of Onset   Stroke Mother    Cancer - Prostate Father     Prior to Admission medications   Medication Sig Start Date End Date Taking? Authorizing Provider  acetaminophen (TYLENOL) 500 MG tablet Take 500-1,000 mg by mouth every 6 (six) hours as needed for mild pain or headache (ONLY WHEN NOT TAKING ALEVE).    [provider]  aspirin EC 81 MG tablet Take 81 mg by mouth in the morning. Swallow whole.    [provider]  atorvastatin (LIPITOR) 40 MG tablet TAKE 1 TABLET BY MOUTH ONCE DAILY AT  6  PM Patient taking differently: Take 40 mg by mouth at bedtime. 03/16/21   Frann Rider, NP  diphenhydrAMINE (BENADRYL) 25 MG tablet Take 25 mg by mouth in the morning and at bedtime.    [provider]  finasteride (PROSCAR) 5 MG tablet Take 5 mg by mouth at bedtime.     [provider]  hydrochlorothiazide (HYDRODIURIL) 50 MG tablet Take 50 mg by mouth at bedtime.     [provider]  Liniments (SALONPAS PAIN RELIEF PATCH EX) Place 1 patch onto the skin daily as needed (to affected  area for pain).     [provider]  lisinopril (PRINIVIL,ZESTRIL) 20 MG tablet Take 20 mg by mouth at bedtime.     [provider]  Multiple Vitamins-Minerals (PRESERVISION AREDS 2 PO) Take 1 capsule by mouth 2 (two) times daily.     [provider]  naproxen sodium (ALEVE) 220 MG tablet Take 220-440 mg by mouth 2 (two) times daily as needed (pain.).    [provider]  Omega-3 Fatty Acids (FISH OIL) 1200 MG CAPS Take 1,200 mg by mouth daily with breakfast.     [provider]  ondansetron (ZOFRAN) 8 MG tablet Take 8 mg by mouth every 8 (eight) hours as needed for nausea or vomiting.    [provider]  SYSTANE 0.4-0.3 % SOLN Place 1 drop into both eyes 3 (three) times daily as needed (for dryness).    [provider]  Tamsulosin HCl (FLOMAX) 0.4 MG CAPS Take 0.4 mg by mouth at bedtime.     [provider]  tiZANidine (ZANAFLEX) 4 MG tablet Take 1 tablet (4 mg total) by mouth every 8 (eight) hours as needed for muscle spasms. Patient taking differently: Take 4 mg by mouth at bedtime. 08/11/18   Eustace Moore, MD  traMADol (ULTRAM) 50 MG tablet Take 1-2 tablets (50-100 mg total) by mouth every 6 (six) hours as needed. 04/11/22   Mcarthur Rossetti, MD  Turmeric (QC TUMERIC COMPLEX PO) Take 500 mg by mouth daily with breakfast.     [provider]    Physical Exam: Vitals:   02/22/23 0130 02/22/23 0145 02/22/23 0215 02/22/23 0245  BP: (!) 108/50 (!) 97/49 128/71 135/71  Pulse: 70 69 65 65  Resp: 18 (!) '21 16 16  '$ Temp:      TempSrc:      SpO2: 92% 94% 96% 96%  Weight:      Height:        Physical Exam Vitals and nursing note reviewed.  Constitutional:      General: He is not in acute distress.    Appearance: He is not ill-appearing, toxic-appearing or diaphoretic.  HENT:     Head: Normocephalic and atraumatic.     Nose: Nose normal.  Eyes:     General: No scleral icterus. Cardiovascular:     Rate and Rhythm: Normal rate and regular rhythm.     Pulses: Normal pulses.  Pulmonary:     Effort: Pulmonary effort is normal.     Breath sounds: Normal breath sounds.  Abdominal:     General: Bowel sounds are normal. There is no distension.     Tenderness: There is no abdominal tenderness.  Genitourinary:    Comments: +foley. Dark yellow urine in foley bag Musculoskeletal:     Right lower leg: No edema.     Left lower leg: No edema.  Skin:    General: Skin is warm and dry.     Capillary Refill: Capillary refill takes less than 2 seconds.  Neurological:     General: No focal deficit present.     Mental Status: He is alert and oriented to person, place, and time.     Comments: Hard of hearing      Labs on Admission: I have personally reviewed following labs and imaging studies  CBC: Recent Labs  Lab 02/22/23 0105  WBC 30.2*  NEUTROABS 27.0*  HGB 13.2  HCT 37.9*  MCV 95.9  PLT [id number]   Basic Metabolic Panel: Recent Labs  Lab  02/22/23 0105  NA 131*  K 3.4*  CL 93*  CO2 22  GLUCOSE 137*  BUN 25*  CREATININE 1.89*  CALCIUM 9.2  MG 1.8   GFR: Estimated Creatinine Clearance: 31.2 mL/min (A) (by C-G formula based on SCr of 1.89 mg/dL (H)). Liver Function Tests: Recent Labs  Lab 02/22/23 0105  AST 27  ALT 7  ALKPHOS 54  BILITOT 0.9  PROT 6.8  ALBUMIN 3.5    Cardiac  Enzymes: Recent Labs  Lab 02/22/23 0105 02/22/23 0335  TROPONINIHS 14 14   BNP (last 3 results) Recent Labs    02/22/23 0105  BNP 97.3   Urine analysis:    Component Value Date/Time   COLORURINE AMBER (A) 02/22/2023 0330   APPEARANCEUR CLOUDY (A) 02/22/2023 0330   LABSPEC 1.019 02/22/2023 0330   PHURINE 5.0 02/22/2023 0330   GLUCOSEU NEGATIVE 02/22/2023 0330   HGBUR LARGE (A) 02/22/2023 0330   BILIRUBINUR NEGATIVE 02/22/2023 0330   KETONESUR NEGATIVE 02/22/2023 0330   PROTEINUR 100 (A) 02/22/2023 0330   UROBILINOGEN 0.2 05/27/2010 1530   NITRITE NEGATIVE 02/22/2023 0330   LEUKOCYTESUR LARGE (A) 02/22/2023 0330    Radiological Exams on Admission: I have personally reviewed images CT CHEST ABDOMEN PELVIS WO CONTRAST  Result Date: 02/22/2023 CLINICAL DATA:  Sepsis EXAM: CT CHEST, ABDOMEN AND PELVIS WITHOUT CONTRAST TECHNIQUE: Multidetector CT imaging of the chest, abdomen and pelvis was performed following the standard protocol without IV contrast. RADIATION DOSE REDUCTION: This exam was performed according to the departmental dose-optimization program which includes automated exposure control, adjustment of the mA and/or kV according to patient size and/or use of iterative reconstruction technique. COMPARISON:  05/27/2010 FINDINGS: CT CHEST FINDINGS Cardiovascular: Heart is normal size. Aorta is normal caliber. Coronary artery and aortic calcifications. Mediastinum/Nodes: No mediastinal, hilar, or axillary adenopathy. Trachea and esophagus are unremarkable. 2.6 cm solid-appearing nodule in the left thyroid lobe. Lungs/Pleura: Bibasilar atelectasis. Otherwise no confluent opacities or effusions. Musculoskeletal: Chest [place] soft tissues are unremarkable. No acute bony abnormality. Spinal stimulator in place in the lower thoracic spine. CT ABDOMEN PELVIS FINDINGS Hepatobiliary: Small gallstones within the gallbladder. No biliary ductal dilatation or focal hepatic abnormality. Pancreas: No  focal abnormality or ductal dilatation. Spleen: Benign appearing calcifications in the spleen.  Normal size. Adrenals/Urinary Tract: No renal or adrenal mass. No stones or hydronephrosis. Urinary bladder decompressed with Foley catheter in place. Stomach/Bowel: Diffuse colonic diverticulosis, most pronounced in the sigmoid colon. No active diverticulitis. Stomach and small bowel decompressed. No bowel obstruction. Vascular/Lymphatic: Aortic atherosclerosis. No evidence of aneurysm or adenopathy. Reproductive: No visible focal abnormality. Other: No free fluid or free air. Musculoskeletal: No acute bony abnormality. Postoperative changes and degenerative changes in the lumbar spine. IMPRESSION: No acute findings in the chest, abdomen or pelvis. Coronary artery disease, aortic atherosclerosis. Bibasilar atelectasis. Cholelithiasis.  No CT evidence of acute cholecystitis. Colonic diverticulosis.  No active diverticulitis. 2.6 cm left thyroid nodule. Recommend non emergent thyroid US (ref: J Am Coll Radiol. 2015 Feb;12(2): 143-50). Electronically Signed   By: Rolm Baptise M.D.   On: 02/22/2023 03:34   CT CERVICAL SPINE WO CONTRAST  Result Date: 02/22/2023 CLINICAL DATA:  Neck trauma (Age >= 65y).  Syncope EXAM: CT CERVICAL SPINE WITHOUT CONTRAST TECHNIQUE: Multidetector CT imaging of the cervical spine was performed without intravenous contrast. Multiplanar CT image reconstructions were also generated. RADIATION DOSE REDUCTION: This exam was performed according to the departmental dose-optimization program which includes automated exposure control, adjustment of the mA and/or kV according  to patient size and/or use of iterative reconstruction technique. COMPARISON:  None Available. FINDINGS: Alignment: No subluxation Skull base and vertebrae: No acute fracture. No primary bone lesion or focal pathologic process. Soft tissues and spinal canal: No prevertebral fluid or swelling. No visible canal hematoma. 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 Diffuse advanced degenerative disc disease and facet disease. Upper chest: No acute findings Other: None IMPRESSION: Advanced diffuse degenerative disc and facet disease. No acute bony abnormality. Electronically Signed   By: Rolm Baptise M.D.   On: 02/22/2023 03:30   CT HEAD WO CONTRAST  Result Date: 02/22/2023 CLINICAL DATA:  Syncope EXAM: CT HEAD WITHOUT CONTRAST TECHNIQUE: Contiguous axial images were obtained from the base of the skull through the vertex without intravenous contrast. RADIATION DOSE REDUCTION: This exam was performed according to the departmental dose-optimization program which includes automated exposure control, adjustment of the mA and/or kV according to patient size and/or use of iterative reconstruction technique. COMPARISON:  02/04/2020 FINDINGS: Brain: There is atrophy and chronic small vessel disease changes. No acute intracranial abnormality. Specifically, no hemorrhage, hydrocephalus, mass lesion, acute infarction, or significant intracranial injury. Vascular: No hyperdense vessel or unexpected calcification. Skull: No acute calvarial abnormality. Sinuses/Orbits: No acute findings Other: None IMPRESSION: Atrophy, chronic microvascular disease. No acute intracranial abnormality. Electronically Signed   By: Rolm Baptise M.D.   On: 02/22/2023 03:28   DG Elbow 2 Views Left  Result Date: 02/22/2023 CLINICAL DATA:  Fall EXAM: LEFT ELBOW - 2 VIEW COMPARISON:  None Available. FINDINGS: No acute bony abnormality. Specifically, no fracture, subluxation, or dislocation. No joint effusion. Soft tissues are intact. IMPRESSION: No acute bony abnormality. Electronically Signed   By: Rolm Baptise M.D.   On: 02/22/2023 01:31   DG Chest Port 1 View  Result Date: 02/22/2023 CLINICAL DATA:  Fall, syncope EXAM: PORTABLE CHEST 1 VIEW COMPARISON:  07/06/2021 FINDINGS: Heart is mildly enlarged. Vascular congestion. Bibasilar atelectasis. No overt edema, effusions or  pneumothorax. No acute bony abnormality. IMPRESSION: Cardiomegaly, vascular congestion. Bibasilar atelectasis. Electronically Signed   By: Rolm Baptise M.D.   On: 02/22/2023 01:31    EKG: My personal interpretation of EKG shows: NSR    Assessment/Plan Principal Problem:   UTI (urinary tract infection) due to urinary indwelling Foley catheter (HCC) Active Problems:   Sepsis with acute renal failure without septic shock (HCC)   Essential hypertension   Benign prostatic hyperplasia with lower urinary tract symptoms   GERD (gastroesophageal reflux disease)   Chronic back pain    Assessment and Plan: * UTI (urinary tract infection) due to urinary indwelling Foley catheter ([place]) Observation med tele bed. Continue with IV rocephin 2 gram daily. Wife states pt has been taking cipro at home for the last week. Urine culture may not grow anything.  Sepsis with acute renal failure without septic shock (HCC) Fulfills sepsis criteria. WBC 30K, UA positive for pyuria, Scr 1.89. baseline Scr 0.89.  Chronic back pain Chronic.  GERD (gastroesophageal reflux disease) Stable.  Benign prostatic hyperplasia with lower urinary tract symptoms Pt states he was started on flomax yesterday by urologist. Could cause drop in his BP. He also took 8 mg of zanaflex instead of his usual 4 mg last night. Also contributes to his low BP.  Essential hypertension Hold HCTZ/ACEI due to AKI.   DVT prophylaxis: SQ Heparin Code Status: Full Code Family Communication: discussed at bedside with pt and wife Jay Tran  Disposition Plan: return home  Consults called: none  Admission status: Observation,  Telemetry bed   Kristopher Oppenheim, DO [facility] 02/22/2023, 4:34 AM

## 2023-02-22 NOTE — Assessment & Plan Note (Signed)
Chronic. 

## 2023-02-22 NOTE — Assessment & Plan Note (Signed)
Pt states he was started on flomax yesterday by urologist. Could cause drop in his BP. He also took 8 mg of zanaflex instead of his usual 4 mg last night. Also contributes to his low BP.

## 2023-02-22 NOTE — ED Notes (Signed)
Called meal issue to request pt breakfast tray x2. Pt never received tray

## 2023-02-22 NOTE — Assessment & Plan Note (Signed)
Stable. 

## 2023-02-22 NOTE — ED Notes (Signed)
Pt cleaned up and bed pad changed pt had bowel movement.

## 2023-02-22 NOTE — Progress Notes (Signed)
PROGRESS NOTE   CREEK SWADER  [id number]    DOB: 17-Dec-1934    DOA: 02/22/2023  PCP: Leonard Downing, MD   I have briefly reviewed patients previous medical records in [facility]  Chief Complaint  Patient presents with   Near Syncope    Brief Narrative:  89 year old married male, ambulates with the help of cane or a walker, medical history significant for BPH, s/p TURP, chronic back pain, hypertension, visual and hearing impaired, gait imbalance and falls, chronic Benadryl use, s/p recent Foley catheter placement for urinary retention, failed voiding trial at urologist office on day of admission, presented with fall at home, syncope, diarrhea x 1.  Admitted for diagnosis of syncope, acute kidney injury, suspected catheter associated urinary tract infection.   Assessment & Plan:  Principal Problem:   Acute kidney injury superimposed on chronic kidney disease ([place]) Active Problems:   UTI (urinary tract infection) due to urinary indwelling Foley catheter [facility])   Essential hypertension   Benign prostatic hyperplasia with lower urinary tract symptoms   GERD (gastroesophageal reflux disease)   Chronic back pain   Syncope and collapse   Acute kidney injury complicating stage II chronic kidney disease: Last known creatinine 0.75 in September 2022, GFR >80.  Presented with creatinine of 1.89.  AKI suspected due to dehydration, HCTZ, lisinopril,?  NSAID use.  Hold HCTZ and lisinopril.  Renal ultrasound without hydronephrosis.  S/p 2 L IV fluid bolus in the ED followed by maintenance IV fluids.  Avoid nephrotoxics.  Trend BMP including later this morning.  Syncope and collapse: Suspect multifactorial.  DD: Orthostasis,?  Bradycardia, polypharmacy including increased dose of Zanaflex, recently started Flomax and may be even vasovagal due to sudden urge for BM.  In ED, BP dropped from supine 129/59-117/51, no standing BPs recorded.  Telemetry shows sinus rhythm.  TTE from February  2021 showed normal EF and no aortic valve stenosis.  Will repeat 2D echo, continue telemetry.  Patient does not drive.  Suspected catheter associated UTI: Continue IV ceftriaxone pending urine culture results.  Sepsis ruled out.  Did not have sustained SIRS criteria except leukocytosis.  Hypokalemia: Replace and follow.  Leukocytosis: Suspected leukemoid reaction/stress response.  Predominantly neutrophilic.  Ordered peripheral smear review by pathology.  Follow CBC including later today.  BPH/acute urinary retention: Although chronic Benadryl may have contributed, not the only precipitating factor given history of prolonged use for approximately a year.  However since Benadryl not helping his upper respiratory congestion for which she was taking it, recommended stopping indefinitely.  Follows with Dr. Tresa Moore, [facility] and has an appointment for next Friday to reassess voiding trial.  Continue Flomax and Proscar.  Gait ataxia and falls: Spouse reports 3 falls last year and a fall PTA.  Was getting outpatient PT evaluation twice a week.  PT consulted.  CT head and C-spine without acute findings.  Left shoulder restricted mobility: Noted after fall in May 2023.  Seen by Dr. Jean Rosenthal, orthopedics and referred to her next orthopedic surgeon,?  Shoulder replacement recommended but patient/family do not want to pursue surgery given his advanced age.  Essential hypertension: Hold HCTZ and lisinopril due to AKI.  As needed IV hydralazine.  Incidental finding on CT C/A/P: 2.6 cm left thyroid nodule: As per recommendations, nonemergent thyroid ultrasound, this can be pursued as an outpatient via PCPs office.  Body mass index is 29.53 kg/m./Overweight    DVT prophylaxis:   Subcutaneous heparin   Code Status: Full Code:  ACP Documents: None present. Family Communication: Spouse at bedside Disposition:  Status is: Observation The patient remains OBS appropriate and will d/c  before 2 midnights.     Consultants:     Procedures:     Antimicrobials:   As noted above.   Subjective:  Patient reports no BM since hospital admission.  Chronic left shoulder restricted movements and unable to raise up much of the bed.  Mild left elbow soreness following fall.  Objective:   Vitals:   02/22/23 0445 02/22/23 0515 02/22/23 0545 02/22/23 0601  BP: (!) 152/60 (!) 158/68 (!) 150/67   Pulse: (!) 55 76 75   Resp: 16 16 (!) 26   Temp:    98.8 F (37.1 C)  TempSrc:      SpO2: 94% 95% 94%   Weight:      Height:        General exam: Pleasant elderly male, moderately built and overweight lying comfortably propped up in bed without distress.  Oral mucosa with borderline hydration.  Hard of hearing and has hearing aid in right ear.  Bilateral intraocular lens, blind in right eye and diminished visual acuity in the left eye. Respiratory system: Clear to auscultation. Respiratory effort normal. Cardiovascular system: S1 & S2 heard, RRR. No JVD, murmurs, rubs, gallops or clicks. No pedal edema.  Telemetry personally reviewed: Sinus rhythm. Gastrointestinal system: Abdomen is nondistended, soft and nontender. No organomegaly or masses felt. Normal bowel sounds heard. GU: Has indwelling Foley catheter with amber-colored urine in the bag. Central nervous system: Alert and oriented. No focal neurological deficits. Extremities: Symmetric 5 x 5 power.  Tiny bruise over left elbow.  Also has a chronic or old appearing bruise over left anterior shoulder. Skin: No rashes, lesions or ulcers Psychiatry: Judgement and insight appear normal. Mood & affect appropriate.     Data Reviewed:   I have personally reviewed following labs and imaging studies   CBC: Recent Labs  Lab 02/22/23 0105  WBC 30.2*  NEUTROABS 27.0*  HGB 13.2  HCT 37.9*  MCV 95.9  PLT [id number]    Basic Metabolic Panel: Recent Labs  Lab 02/22/23 0105  NA 131*  K 3.4*  CL 93*  CO2 22  GLUCOSE 137*  BUN  25*  CREATININE 1.89*  CALCIUM 9.2  MG 1.8    Liver Function Tests: Recent Labs  Lab 02/22/23 0105  AST 27  ALT 7  ALKPHOS 54  BILITOT 0.9  PROT 6.8  ALBUMIN 3.5    CBG: No results for input(s): "GLUCAP" in the last 168 hours.  Microbiology Studies:   Recent Results (from the past 240 hour(s))  Resp panel by RT-PCR (RSV, Flu A&B, Covid)     Status: None   Collection Time: 02/22/23  2:30 AM   Specimen: Nasal Swab  Result Value Ref Range Status   SARS Coronavirus 2 by RT PCR NEGATIVE NEGATIVE Final   Influenza A by PCR NEGATIVE NEGATIVE Final   Influenza B by PCR NEGATIVE NEGATIVE Final    Comment: (NOTE) The Xpert Xpress SARS-CoV-2/FLU/RSV plus assay is intended as an aid in the diagnosis of influenza from Nasopharyngeal swab specimens and should not be used as a sole basis for treatment. Nasal washings and aspirates are unacceptable for Xpert Xpress SARS-CoV-2/FLU/RSV testing.  Fact Sheet for Patients: EntrepreneurPulse.com.au  Fact Sheet for Healthcare Providers: IncredibleEmployment.be  This test is not yet approved or cleared by the [place] FDA and has been authorized for detection and/or diagnosis of SARS-CoV-2  by FDA under an Emergency Use Authorization (EUA). This EUA will remain in effect (meaning this test can be used) for the duration of the COVID-19 declaration under Section 564(b)(1) of the Act, 21 U.S.C. section 360bbb-3(b)(1), unless the authorization is terminated or revoked.     Resp Syncytial Virus by PCR NEGATIVE NEGATIVE Final    Comment: (NOTE) Fact Sheet for Patients: EntrepreneurPulse.com.au  Fact Sheet for Healthcare Providers: IncredibleEmployment.be  This test is not yet approved or cleared by the [place] FDA and has been authorized for detection and/or diagnosis of SARS-CoV-2 by FDA under an Emergency Use Authorization (EUA). This EUA will  remain in effect (meaning this test can be used) for the duration of the COVID-19 declaration under Section 564(b)(1) of the Act, 21 U.S.C. section 360bbb-3(b)(1), unless the authorization is terminated or revoked.  Performed at [facility], [facility] [address], [place], [place] [address]   Urine Culture     Status: None (Preliminary result)   Collection Time: 02/22/23  3:30 AM   Specimen: Urine, Catheterized  Result Value Ref Range Status   Specimen Description URINE, CATHETERIZED  Final   Special Requests   Final    NONE Reflexed from [id number] Performed at [facility], [place] [address], [place], [place] [address]    Culture PENDING  Incomplete   Report Status PENDING  Incomplete    Radiology Studies:  CT CHEST ABDOMEN PELVIS WO CONTRAST  Result Date: 02/22/2023 CLINICAL DATA:  Sepsis EXAM: CT CHEST, ABDOMEN AND PELVIS WITHOUT CONTRAST TECHNIQUE: Multidetector CT imaging of the chest, abdomen and pelvis was performed following the standard protocol without IV contrast. RADIATION DOSE REDUCTION: This exam was performed according to the departmental dose-optimization program which includes automated exposure control, adjustment of the mA and/or kV according to patient size and/or use of iterative reconstruction technique. COMPARISON:  05/27/2010 FINDINGS: CT CHEST FINDINGS Cardiovascular: Heart is normal size. Aorta is normal caliber. Coronary artery and aortic calcifications. Mediastinum/Nodes: No mediastinal, hilar, or axillary adenopathy. Trachea and esophagus are unremarkable. 2.6 cm solid-appearing nodule in the left thyroid lobe. Lungs/Pleura: Bibasilar atelectasis. Otherwise no confluent opacities or effusions. Musculoskeletal: Chest Gonzalez soft tissues are unremarkable. No acute bony abnormality. Spinal stimulator in place in the lower thoracic spine. CT ABDOMEN PELVIS FINDINGS Hepatobiliary: Small gallstones within the gallbladder. No biliary ductal dilatation or focal hepatic  abnormality. Pancreas: No focal abnormality or ductal dilatation. Spleen: Benign appearing calcifications in the spleen.  Normal size. Adrenals/Urinary Tract: No renal or adrenal mass. No stones or hydronephrosis. Urinary bladder decompressed with Foley catheter in place. Stomach/Bowel: Diffuse colonic diverticulosis, most pronounced in the sigmoid colon. No active diverticulitis. Stomach and small bowel decompressed. No bowel obstruction. Vascular/Lymphatic: Aortic atherosclerosis. No evidence of aneurysm or adenopathy. Reproductive: No visible focal abnormality. Other: No free fluid or free air. Musculoskeletal: No acute bony abnormality. Postoperative changes and degenerative changes in the lumbar spine. IMPRESSION: No acute findings in the chest, abdomen or pelvis. Coronary artery disease, aortic atherosclerosis. Bibasilar atelectasis. Cholelithiasis.  No CT evidence of acute cholecystitis. Colonic diverticulosis.  No active diverticulitis. 2.6 cm left thyroid nodule. Recommend non emergent thyroid US (ref: J Am Coll Radiol. 2015 Feb;12(2): 143-50). Electronically Signed   By: Rolm Baptise M.D.   On: 02/22/2023 03:34   CT CERVICAL SPINE WO CONTRAST  Result Date: 02/22/2023 CLINICAL DATA:  Neck trauma (Age >= 65y).  Syncope EXAM: CT CERVICAL SPINE WITHOUT CONTRAST TECHNIQUE: Multidetector CT imaging of the cervical spine was performed without  intravenous contrast. Multiplanar CT image reconstructions were also generated. RADIATION DOSE REDUCTION: This exam was performed according to the departmental dose-optimization program which includes automated exposure control, adjustment of the mA and/or kV according to patient size and/or use of iterative reconstruction technique. COMPARISON:  None Available. FINDINGS: Alignment: No subluxation Skull base and vertebrae: No acute fracture. No primary bone lesion or focal pathologic process. Soft tissues and spinal canal: No prevertebral fluid or swelling. No visible  canal hematoma. 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 Diffuse advanced degenerative disc disease and facet disease. Upper chest: No acute findings Other: None IMPRESSION: Advanced diffuse degenerative disc and facet disease. No acute bony abnormality. Electronically Signed   By: Rolm Baptise M.D.   On: 02/22/2023 03:30   CT HEAD WO CONTRAST  Result Date: 02/22/2023 CLINICAL DATA:  Syncope EXAM: CT HEAD WITHOUT CONTRAST TECHNIQUE: Contiguous axial images were obtained from the base of the skull through the vertex without intravenous contrast. RADIATION DOSE REDUCTION: This exam was performed according to the departmental dose-optimization program which includes automated exposure control, adjustment of the mA and/or kV according to patient size and/or use of iterative reconstruction technique. COMPARISON:  02/04/2020 FINDINGS: Brain: There is atrophy and chronic small vessel disease changes. No acute intracranial abnormality. Specifically, no hemorrhage, hydrocephalus, mass lesion, acute infarction, or significant intracranial injury. Vascular: No hyperdense vessel or unexpected calcification. Skull: No acute calvarial abnormality. Sinuses/Orbits: No acute findings Other: None IMPRESSION: Atrophy, chronic microvascular disease. No acute intracranial abnormality. Electronically Signed   By: Rolm Baptise M.D.   On: 02/22/2023 03:28   DG Elbow 2 Views Left  Result Date: 02/22/2023 CLINICAL DATA:  Fall EXAM: LEFT ELBOW - 2 VIEW COMPARISON:  None Available. FINDINGS: No acute bony abnormality. Specifically, no fracture, subluxation, or dislocation. No joint effusion. Soft tissues are intact. IMPRESSION: No acute bony abnormality. Electronically Signed   By: Rolm Baptise M.D.   On: 02/22/2023 01:31   DG Chest Port 1 View  Result Date: 02/22/2023 CLINICAL DATA:  Fall, syncope EXAM: PORTABLE CHEST 1 VIEW COMPARISON:  07/06/2021 FINDINGS: Heart is mildly enlarged. Vascular congestion. Bibasilar atelectasis. No  overt edema, effusions or pneumothorax. No acute bony abnormality. IMPRESSION: Cardiomegaly, vascular congestion. Bibasilar atelectasis. Electronically Signed   By: Rolm Baptise M.D.   On: 02/22/2023 01:31    Scheduled Meds:    Continuous Infusions:    cefTRIAXone (ROCEPHIN)  IV     lactated ringers 150 mL/hr at 02/22/23 0256     LOS: 0 days     Vernell Leep, MD,  FACP, [facility], [facility], [facility], [place]     To contact the attending provider between 7A-7P or the covering provider during after hours 7P-7A, please log into the web site [email or web address] and access using universal [place] password for that web site. If you do not have the password, please call the hospital operator.  02/22/2023, 7:32 AM

## 2023-02-22 NOTE — ED Notes (Signed)
Dietary called to request pt breakfast tray notified meal issues that this pt did not receive his breakfast tray.

## 2023-02-22 NOTE — ED Triage Notes (Signed)
Patient Jay Tran with complaint of 2 minute syncopal episode with bradycardia after BM Patient assisted to ground by EMS at time.

## 2023-02-22 NOTE — ED Notes (Signed)
Patient transported to CT 

## 2023-02-22 NOTE — ED Notes (Signed)
Pt bed pad, linen, gown changed. Pt placed on brief.

## 2023-02-22 NOTE — ED Notes (Signed)
Physical therapy at bedside

## 2023-02-22 NOTE — ED Provider Notes (Signed)
[facility] Provider Note   CSN: [id number] Arrival date & time: 02/22/23  [id number]     History  Chief Complaint  Patient presents with   Near Syncope    Jay Tran is a 89 y.o. male.   Near Syncope  Patient presents for syncope.  Medical history includes arthritis, BPH, GERD, DVT, HLD, HTN, CVA, chronic pain.  Home medications include Zanaflex, lisinopril, HCTZ, tamsulosin.  Tonight, he took 2 doses of his Zanaflex.  He has recently been getting seen by urology.  He had his Foley catheter removed yesterday but return to urology office for Foley catheter replacement today due to urinary retention.  He has had low output from his Foley today.  This evening, he was walking to the bathroom with his walker.  He had a fall backwards.  He is not sure if he passed out at the time.  EMS was called for a lift assist.  Fire department and EMS assisted him to the bathroom.  While sitting on the toilet, patient became pale and clammy.  He had a syncopal episode which was witnessed by EMS.  He did not fall.  Heart rate at the time was in the 50s.  SBP was in the 50s as well.  Duration of LOC was approximately 2 minutes.  He had return to mental baseline upon awakening.  He was given a small amount of IV fluid prior to arrival.  Blood pressures normalized during transit.  Currently, patient denies any current symptoms.  He was noted to have a small skin tear to his left elbow.  CBG was normal with EMS.  He does not take any blood thinning medication other than a baby aspirin.     Home Medications Prior to Admission medications   Medication Sig Start Date End Date Taking? Authorizing Provider  acetaminophen (TYLENOL) 500 MG tablet Take 500-1,000 mg by mouth every 6 (six) hours as needed for mild pain or headache (ONLY WHEN NOT TAKING ALEVE).    [provider]  aspirin EC 81 MG tablet Take 81 mg by mouth in the morning. Swallow whole.    [provider]  atorvastatin (LIPITOR) 40 MG tablet TAKE 1 TABLET BY MOUTH ONCE DAILY AT  6  PM Patient taking differently: Take 40 mg by mouth at bedtime. 03/16/21   Frann Rider, NP  diphenhydrAMINE (BENADRYL) 25 MG tablet Take 25 mg by mouth in the morning and at bedtime.    [provider]  finasteride (PROSCAR) 5 MG tablet Take 5 mg by mouth at bedtime.     [provider]  hydrochlorothiazide (HYDRODIURIL) 50 MG tablet Take 50 mg by mouth at bedtime.     [provider]  Liniments (SALONPAS PAIN RELIEF PATCH EX) Place 1 patch onto the skin daily as needed (to affected area for pain).     [provider]  lisinopril (PRINIVIL,ZESTRIL) 20 MG tablet Take 20 mg by mouth at bedtime.     [provider]  Multiple Vitamins-Minerals (PRESERVISION AREDS 2 PO) Take 1 capsule by mouth 2 (two) times daily.     [provider]  naproxen sodium (ALEVE) 220 MG tablet Take 220-440 mg by mouth 2 (two) times daily as needed (pain.).    [provider]  Omega-3 Fatty Acids (FISH OIL) 1200 MG CAPS Take 1,200 mg by mouth daily with breakfast.     [provider]  ondansetron (ZOFRAN) 8 MG tablet Take 8  mg by mouth every 8 (eight) hours as needed for nausea or vomiting.    [provider]  SYSTANE 0.4-0.3 % SOLN Place 1 drop into both eyes 3 (three) times daily as needed (for dryness).    [provider]  Tamsulosin HCl (FLOMAX) 0.4 MG CAPS Take 0.4 mg by mouth at bedtime.     [provider]  tiZANidine (ZANAFLEX) 4 MG tablet Take 1 tablet (4 mg total) by mouth every 8 (eight) hours as needed for muscle spasms. Patient taking differently: Take 4 mg by mouth at bedtime. 08/11/18   Eustace Moore, MD  traMADol (ULTRAM) 50 MG tablet Take 1-2 tablets (50-100 mg total) by mouth every 6 (six) hours as needed. 04/11/22   Mcarthur Rossetti, MD  Turmeric (QC TUMERIC COMPLEX PO) Take 500 mg by mouth daily with breakfast.     [provider]      Allergies    Codeine, Other, and Tramadol    Review of Systems   Review of Systems  Cardiovascular:  Positive for near-syncope.  Neurological:  Positive for syncope.  All other systems reviewed and are negative.   Physical Exam Updated Vital Signs BP 135/71   Pulse 65   Temp 98.6 F (37 C) (Oral)   Resp 16   Ht '5\' 9"'$  (1.753 m)   Wt 90.7 kg   SpO2 96%   BMI 29.53 kg/m  Physical Exam Vitals and nursing note reviewed.  Constitutional:      General: He is not in acute distress.    Appearance: Normal appearance. He is well-developed. He is not ill-appearing, toxic-appearing or diaphoretic.  HENT:     Head: Normocephalic and atraumatic.     Right Ear: External ear normal.     Left Ear: External ear normal.     Nose: Nose normal.     Mouth/Throat:     Mouth: Mucous membranes are moist.  Eyes:     Extraocular Movements: Extraocular movements intact.     Conjunctiva/sclera: Conjunctivae normal.  Cardiovascular:     Rate and Rhythm: Normal rate and regular rhythm.     Heart sounds: No murmur heard. Pulmonary:     Effort: Pulmonary effort is normal. No respiratory distress.     Breath sounds: Normal breath sounds. No wheezing or rales.  Chest:     Chest Hehir: No tenderness.  Abdominal:     General: There is no distension.     Palpations: Abdomen is soft.     Tenderness: There is no abdominal tenderness.  Musculoskeletal:        General: No swelling or deformity. Normal range of motion.     Cervical back: Normal range of motion and neck supple.     Right lower leg: No edema.     Left lower leg: No edema.  Skin:    General: Skin is warm and dry.     Comments: Small skin tear to left elbow  Neurological:     General: No focal deficit present.     Mental Status: He is alert and oriented to person, place, and time.     Cranial Nerves: No cranial nerve deficit.     Sensory: No sensory deficit.     Motor: No weakness.     Coordination:  Coordination normal.  Psychiatric:        Mood and Affect: Mood normal.        Behavior: Behavior normal.        Thought Content:  Thought content normal.        Judgment: Judgment normal.     ED Results / Procedures / Treatments   Labs (all labs ordered are listed, but only abnormal results are displayed) Labs Reviewed  BASIC METABOLIC PANEL - Abnormal; Notable for the following components:      Result Value   Sodium 131 (*)    Potassium 3.4 (*)    Chloride 93 (*)    Glucose, Bld 137 (*)    BUN 25 (*)    Creatinine, Ser 1.89 (*)    GFR, Estimated 34 (*)    Anion gap 16 (*)    All other components within normal limits  CBC WITH DIFFERENTIAL/PLATELET - Abnormal; Notable for the following components:   WBC 30.2 (*)    RBC 3.95 (*)    HCT 37.9 (*)    Neutro Abs 27.0 (*)    Monocytes Absolute 1.7 (*)    Abs Immature Granulocytes 0.45 (*)    All other components within normal limits  LACTIC ACID, PLASMA - Abnormal; Notable for the following components:   Lactic Acid, Venous 2.3 (*)    All other components within normal limits  URINALYSIS, W/ REFLEX TO CULTURE (INFECTION SUSPECTED) - Abnormal; Notable for the following components:   Color, Urine AMBER (*)    APPearance CLOUDY (*)    Hgb urine dipstick LARGE (*)    Protein, ur 100 (*)    Leukocytes,Ua LARGE (*)    Bacteria, UA FEW (*)    All other components within normal limits  RESP PANEL BY RT-PCR (RSV, FLU A&B, COVID)  RVPGX2  CULTURE, BLOOD (ROUTINE X 2)  CULTURE, BLOOD (ROUTINE X 2)  URINE CULTURE  MAGNESIUM  BRAIN NATRIURETIC PEPTIDE  HEPATIC FUNCTION PANEL  LACTIC ACID, PLASMA  LACTIC ACID, PLASMA  CBG MONITORING, ED  TROPONIN I (HIGH SENSITIVITY)  TROPONIN I (HIGH SENSITIVITY)    EKG EKG Interpretation  Date/Time:  Wednesday February 22 2023 00:58:26 EST Ventricular Rate:  71 PR Interval:  216 QRS Duration: 116 QT Interval:  415 QTC Calculation: 451 R Axis:   -24 Text Interpretation: Sinus rhythm  Atrial premature complex Borderline prolonged PR interval Incomplete right bundle branch block Confirmed by Godfrey Pick ([id number]) on 02/22/2023 3:03:59 AM  Radiology CT CHEST ABDOMEN PELVIS WO CONTRAST  Result Date: 02/22/2023 CLINICAL DATA:  Sepsis EXAM: CT CHEST, ABDOMEN AND PELVIS WITHOUT CONTRAST TECHNIQUE: Multidetector CT imaging of the chest, abdomen and pelvis was performed following the standard protocol without IV contrast. RADIATION DOSE REDUCTION: This exam was performed according to the departmental dose-optimization program which includes automated exposure control, adjustment of the mA and/or kV according to patient size and/or use of iterative reconstruction technique. COMPARISON:  05/27/2010 FINDINGS: CT CHEST FINDINGS Cardiovascular: Heart is normal size. Aorta is normal caliber. Coronary artery and aortic calcifications. Mediastinum/Nodes: No mediastinal, hilar, or axillary adenopathy. Trachea and esophagus are unremarkable. 2.6 cm solid-appearing nodule in the left thyroid lobe. Lungs/Pleura: Bibasilar atelectasis. Otherwise no confluent opacities or effusions. Musculoskeletal: Chest Jaggi soft tissues are unremarkable. No acute bony abnormality. Spinal stimulator in place in the lower thoracic spine. CT ABDOMEN PELVIS FINDINGS Hepatobiliary: Small gallstones within the gallbladder. No biliary ductal dilatation or focal hepatic abnormality. Pancreas: No focal abnormality or ductal dilatation. Spleen: Benign appearing calcifications in the spleen.  Normal size. Adrenals/Urinary Tract: No renal or adrenal mass. No stones or hydronephrosis. Urinary bladder decompressed with Foley catheter in place. Stomach/Bowel: Diffuse colonic diverticulosis, most pronounced in the sigmoid  colon. No active diverticulitis. Stomach and small bowel decompressed. No bowel obstruction. Vascular/Lymphatic: Aortic atherosclerosis. No evidence of aneurysm or adenopathy. Reproductive: No visible focal abnormality. Other: No  free fluid or free air. Musculoskeletal: No acute bony abnormality. Postoperative changes and degenerative changes in the lumbar spine. IMPRESSION: No acute findings in the chest, abdomen or pelvis. Coronary artery disease, aortic atherosclerosis. Bibasilar atelectasis. Cholelithiasis.  No CT evidence of acute cholecystitis. Colonic diverticulosis.  No active diverticulitis. 2.6 cm left thyroid nodule. Recommend non emergent thyroid US (ref: J Am Coll Radiol. 2015 Feb;12(2): 143-50). Electronically Signed   By: Rolm Baptise M.D.   On: 02/22/2023 03:34   CT CERVICAL SPINE WO CONTRAST  Result Date: 02/22/2023 CLINICAL DATA:  Neck trauma (Age >= 65y).  Syncope EXAM: CT CERVICAL SPINE WITHOUT CONTRAST TECHNIQUE: Multidetector CT imaging of the cervical spine was performed without intravenous contrast. Multiplanar CT image reconstructions were also generated. RADIATION DOSE REDUCTION: This exam was performed according to the departmental dose-optimization program which includes automated exposure control, adjustment of the mA and/or kV according to patient size and/or use of iterative reconstruction technique. COMPARISON:  None Available. FINDINGS: Alignment: No subluxation Skull base and vertebrae: No acute fracture. No primary bone lesion or focal pathologic process. Soft tissues and spinal canal: No prevertebral fluid or swelling. No visible canal hematoma. 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 Diffuse advanced degenerative disc disease and facet disease. Upper chest: No acute findings Other: None IMPRESSION: Advanced diffuse degenerative disc and facet disease. No acute bony abnormality. Electronically Signed   By: Rolm Baptise M.D.   On: 02/22/2023 03:30   CT HEAD WO CONTRAST  Result Date: 02/22/2023 CLINICAL DATA:  Syncope EXAM: CT HEAD WITHOUT CONTRAST TECHNIQUE: Contiguous axial images were obtained from the base of the skull through the vertex without intravenous contrast. RADIATION DOSE REDUCTION: This  exam was performed according to the departmental dose-optimization program which includes automated exposure control, adjustment of the mA and/or kV according to patient size and/or use of iterative reconstruction technique. COMPARISON:  02/04/2020 FINDINGS: Brain: There is atrophy and chronic small vessel disease changes. No acute intracranial abnormality. Specifically, no hemorrhage, hydrocephalus, mass lesion, acute infarction, or significant intracranial injury. Vascular: No hyperdense vessel or unexpected calcification. Skull: No acute calvarial abnormality. Sinuses/Orbits: No acute findings Other: None IMPRESSION: Atrophy, chronic microvascular disease. No acute intracranial abnormality. Electronically Signed   By: Rolm Baptise M.D.   On: 02/22/2023 03:28   DG Elbow 2 Views Left  Result Date: 02/22/2023 CLINICAL DATA:  Fall EXAM: LEFT ELBOW - 2 VIEW COMPARISON:  None Available. FINDINGS: No acute bony abnormality. Specifically, no fracture, subluxation, or dislocation. No joint effusion. Soft tissues are intact. IMPRESSION: No acute bony abnormality. Electronically Signed   By: Rolm Baptise M.D.   On: 02/22/2023 01:31   DG Chest Port 1 View  Result Date: 02/22/2023 CLINICAL DATA:  Fall, syncope EXAM: PORTABLE CHEST 1 VIEW COMPARISON:  07/06/2021 FINDINGS: Heart is mildly enlarged. Vascular congestion. Bibasilar atelectasis. No overt edema, effusions or pneumothorax. No acute bony abnormality. IMPRESSION: Cardiomegaly, vascular congestion. Bibasilar atelectasis. Electronically Signed   By: Rolm Baptise M.D.   On: 02/22/2023 01:31    Procedures Procedures    Medications Ordered in ED Medications  lactated ringers infusion ( Intravenous New Bag/Given 02/22/23 0256)  vancomycin (VANCOREADY) IVPB 500 mg/100 mL (has no administration in time range)  ceFEPIme (MAXIPIME) 2 g in sodium chloride 0.9 % 100 mL IVPB (0 g Intravenous Stopped 02/22/23 0324)  vancomycin (VANCOREADY) IVPB 2000 mg/400 mL (2,000 mg  Intravenous New Bag/Given 02/22/23 0339)  lactated ringers bolus 1,000 mL (0 mLs Intravenous Stopped 02/22/23 0252)  metroNIDAZOLE (FLAGYL) IVPB 500 mg (0 mg Intravenous Stopped 02/22/23 0354)  lactated ringers bolus 1,000 mL (1,000 mLs Intravenous New Bag/Given 02/22/23 0405)    ED Course/ Medical Decision Making/ A&P                             Medical Decision Making Amount and/or Complexity of Data Reviewed Labs: ordered. Radiology: ordered. ECG/medicine tests: ordered.  Risk Prescription drug management.   This patient presents to the ED for concern of syncope, this involves an extensive number of treatment options, and is a complaint that carries with it a high risk of complications and morbidity.  The differential diagnosis includes vasovagal episode, polypharmacy, orthostatic hypotension, sepsis, arrhythmia, dehydration   Co morbidities that complicate the patient evaluation  arthritis, BPH, GERD, DVT, HLD, HTN, CVA, chronic pain   Additional history obtained:  Additional history obtained from EMS, patient's wife External records from outside source obtained and reviewed including EMR   Lab Tests:  I Ordered, and personally interpreted labs.  The pertinent results include: Leukocytosis and lactic acidosis concerning for sepsis.  Urinalysis consistent with UTI.  AKI is present.   Imaging Studies ordered:  I ordered imaging studies including x-ray imaging of chest and left elbow; CT imaging of head, cervical spine, chest, abdomen, pelvis I independently visualized and interpreted imaging which showed no acute findings I agree with the radiologist interpretation   Cardiac Monitoring: / EKG:  The patient was maintained on a cardiac monitor.  I personally viewed and interpreted the cardiac monitored which showed an underlying rhythm of: Sinus rhythm   Problem List / ED Course / Critical interventions / Medication management  Patient presents after syncopal episode.   Syncopal episode was witnessed by EMS.  He may have had another syncopal episode prior to EMS arrival, causing him to fall.  Vital signs are normal on arrival.  Patient is alert and oriented.  He has no focal neurologic deficits.  Other than a small skin tear to his left elbow, he has no external evidence of trauma.  He denies any current symptoms.  When going from supine to sitting, patient had a 15 point drop in SBP.  Home medications of tamsulosin and Zanaflex (which she did take extra of tonight), may have predisposed him to orthostatic hypotension.  EMS description of syncopal episode while on toilet is consistent with a vasovagal episode.  Patient was placed on bedside cardiac monitor.  Workup was initiated.  Currently in Foley bag, patient has what appears to be concentrated urine.  Will give IV fluids.  Initial lab work is notable for large leukocytosis and a mild lactic acidosis.  This raises concern of sepsis.  Broad-spectrum antibiotics and additional IV fluids were ordered.  Patient underwent CT imaging which did not show any evidence of acute injuries from his fall.  Urinalysis does show evidence of infection.  AKI is present on lab work.  Blood pressures normalized following IV fluids.  Given concern of urosepsis and AKI, patient was admitted to hospitalist for further management. I ordered medication including IV fluids and broad-spectrum antibiotics for sepsis and AKI Reevaluation of the patient after these medicines showed that the patient improved I have reviewed the patients home medicines and have made adjustments as needed   Social Determinants of  Health:  Lives at home with wife, has access to outpatient care  CRITICAL CARE Performed by: Godfrey Pick   Total critical care time: 35 minutes  Critical care time was exclusive of separately billable procedures and treating other patients.  Critical care was necessary to treat or prevent imminent or life-threatening  deterioration.  Critical care was time spent personally by me on the following activities: development of treatment plan with patient and/or surrogate as well as nursing, discussions with consultants, evaluation of patient's response to treatment, examination of patient, obtaining history from patient or surrogate, ordering and performing treatments and interventions, ordering and review of laboratory studies, ordering and review of radiographic studies, pulse oximetry and re-evaluation of patient's condition.         Final Clinical Impression(s) / ED Diagnoses Final diagnoses:  Syncope and collapse  AKI (acute kidney injury) ([place])  Urinary tract infection with hematuria, site unspecified  Sepsis, due to unspecified organism, unspecified whether acute organ dysfunction present [facility])    Rx / DC Orders ED Discharge Orders     None         Godfrey Pick, MD 02/22/23 [phone]

## 2023-02-22 NOTE — Assessment & Plan Note (Signed)
Observation med tele bed. Continue with IV rocephin 2 gram daily. Wife states pt has been taking cipro at home for the last week. Urine culture may not grow anything.

## 2023-02-22 NOTE — Progress Notes (Addendum)
Pharmacy Antibiotic Note  Jay Tran is a 89 y.o. male admitted on 02/22/2023 with syncope.  Pharmacy has been consulted for vancomycin and cefepime dosing. Unknown source, but patient dis have a foley catheter recently removed 3/5; Current sCr 1.89 baseline unknown; will monitor vancomycin levels closely   Plan: Vancomycin '2000mg'$  IV x1 then '500mg'$  IV q12 hours (eAUC 553) Cefepime 2G IV Q24 hours  Height: '5\' 9"'$  (175.3 cm) Weight: 90.7 kg (200 lb) IBW/kg (Calculated) : 70.7  Temp (24hrs), Avg:98.6 F (37 C), Min:98.6 F (37 C), Max:98.6 F (37 C)  Recent Labs  Lab 02/22/23 0105  WBC 30.2*  CREATININE 1.89*    Estimated Creatinine Clearance: 31.2 mL/min (A) (by C-G formula based on SCr of 1.89 mg/dL (H)).    Allergies  Allergen Reactions   Codeine Itching and Nausea And Vomiting   Other Nausea Only    Anesthesia   Tramadol Nausea Only   Thank you for allowing pharmacy to be a part of this patient's care.  Jay Tran Jay Tran 02/22/2023 2:27 AM

## 2023-02-22 NOTE — Evaluation (Signed)
Physical Therapy Evaluation Patient Details Name: Jay Tran MRN: [id number] DOB: January 11, 1934 Today's Date: 02/22/2023  History of Present Illness  89 y.o. male presents to [facility] hospital on 02/22/2023 after fall at home with associated syncope and diarrhea. Pt also hound to have AKI and suspected UTI. PMH includes BPH, chronic back pain, HTN, gait instability, GERD.  Clinical Impression  Pt presents to PT with deficits in strength, power, endurance, gait, balance. Pt is generally weak, requiring assistance to stand and to maintain balance when upright. Pt reports a fear of falling with attempts at stepping away from the bed, and does demonstrate increased sway with initial attempts at ambulation. Pt remains at a high risk for falls at this time. Acute PT will continue to follow in an effort to improve LE strength and to reduce falls risk. PT recommends SNF placement at this time.       Recommendations for follow up therapy are one component of a multi-disciplinary discharge planning process, led by the attending physician.  Recommendations may be updated based on patient status, additional functional criteria and insurance authorization.  Follow Up Recommendations Skilled nursing-short term rehab (<3 hours/day) Can patient physically be transported by private vehicle: No    Assistance Recommended at Discharge Frequent or constant Supervision/Assistance  Patient can return home with the following  A lot of help with walking and/or transfers;A lot of help with bathing/dressing/bathroom;Assistance with cooking/housework;Assist for transportation;Help with stairs or ramp for entrance    Equipment Recommendations Wheelchair (measurements PT)  Recommendations for Other Services       Functional Status Assessment Patient has had a recent decline in their functional status and demonstrates the ability to make significant improvements in function in a reasonable and predictable amount of time.      Precautions / Restrictions Precautions Precautions: Fall Restrictions Weight Bearing Restrictions: No      Mobility  Bed Mobility Overal bed mobility: Needs Assistance Bed Mobility: Rolling, Sidelying to Sit, Sit to Supine Rolling: Min assist Sidelying to sit: Mod assist, HOB elevated   Sit to supine: Min assist        Transfers Overall transfer level: Needs assistance Equipment used: Rolling walker (2 wheels), 1 person hand held assist Transfers: Sit to/from Stand Sit to Stand: Min assist, From elevated surface           General transfer comment: pt stands 3 times during session    Ambulation/Gait Ambulation/Gait assistance: Min assist Gait Distance (Feet): 2 Feet Assistive device: Rolling walker (2 wheels) Gait Pattern/deviations: Step-to pattern Gait velocity: reduced Gait velocity interpretation: <1.31 ft/sec, indicative of household ambulator   General Gait Details: pt takes one step forward and backward, then steps to left laterally for 2', one mild loss of balance with step backward. Pt does not feel stable enough to ambulate away from bed at this time  Stairs            Wheelchair Mobility    Modified Rankin (Stroke Patients Only)       Balance Overall balance assessment: Needs assistance Sitting-balance support: No upper extremity supported, Feet supported Sitting balance-Leahy Scale: Fair     Standing balance support: Bilateral upper extremity supported, Reliant on assistive device for balance Standing balance-Leahy Scale: Poor                               Pertinent Vitals/Pain Pain Assessment Pain Assessment: Faces Faces Pain Scale: Hurts little more Pain  Location: low back Pain Descriptors / Indicators: Moaning Pain Intervention(s): Limited activity within patient's tolerance    Home Living Family/patient expects to be discharged to:: Private residence Living Arrangements: Spouse/significant other Available Help  at Discharge: Family;Available 24 hours/day Type of Home: House Home Access: Stairs to enter Entrance Stairs-Rails: Right Entrance Stairs-Number of Steps: 4   Home Layout: One level Home Equipment: Conservation officer, nature (2 wheels);Rollator (4 wheels);Cane - single point;Shower seat;Grab bars - tub/shower      Prior Function Prior Level of Function : History of Falls (last six months)             Mobility Comments: pt ambulates with use of RW, multiple falls in the last year       Hand Dominance        Extremity/Trunk Assessment   Upper Extremity Assessment Upper Extremity Assessment: LUE deficits/detail LUE Deficits / Details: pt with chronic L shoulder ROM deficits since fall in May 2023, minimal active shoulder flexion available, elbow/wrist/digit ROM WFL    Lower Extremity Assessment Lower Extremity Assessment: Generalized weakness    Cervical / Trunk Assessment Cervical / Trunk Assessment: Normal  Communication   Communication: HOH  Cognition Arousal/Alertness: Awake/alert Behavior During Therapy: WFL for tasks assessed/performed Overall Cognitive Status: Within Functional Limits for tasks assessed                                          General Comments General comments (skin integrity, edema, etc.): VSS on RA, pt denies symptoms of orthostatic BP, BP stable in sitting, unable to stand long enough for standing BP at this time    Exercises     Assessment/Plan    PT Assessment Patient needs continued PT services  PT Problem List Decreased strength;Decreased activity tolerance;Decreased mobility;Decreased balance;Decreased knowledge of use of DME;Pain       PT Treatment Interventions DME instruction;Gait training;Stair training;Therapeutic activities;Functional mobility training;Therapeutic exercise;Balance training;Neuromuscular re-education;Patient/family education    PT Goals (Current goals can be found in the Care Plan section)  Acute  Rehab PT Goals Patient Stated Goal: to improve strength, return to ambulation and reduce falls risk PT Goal Formulation: With patient Time For Goal Achievement: 03/08/23 Potential to Achieve Goals: Fair    Frequency Min 3X/week     Co-evaluation               AM-PAC PT "6 Clicks" Mobility  Outcome Measure Help needed turning from your back to your side while in a flat bed without using bedrails?: A Little Help needed moving from lying on your back to sitting on the side of a flat bed without using bedrails?: A Lot Help needed moving to and from a bed to a chair (including a wheelchair)?: A Lot Help needed standing up from a chair using your arms (e.g., wheelchair or bedside chair)?: A Little Help needed to walk in hospital room?: Total Help needed climbing 3-5 steps with a railing? : Total 6 Click Score: 12    End of Session   Activity Tolerance: Patient tolerated treatment well Patient left: in bed;with call bell/phone within reach;with family/visitor present Nurse Communication: Mobility status PT Visit Diagnosis: Other abnormalities of gait and mobility (R26.89);Muscle weakness (generalized) (M62.81);History of falling (Z91.81)    Time: [id number] PT Time Calculation (min) (ACUTE ONLY): 26 min   Charges:   PT Evaluation $PT Eval Low Complexity: 1 Low  Zenaida Niece, PT, DPT Acute Rehabilitation Office [place] 02/22/2023, 9:16 AM

## 2023-02-22 NOTE — Assessment & Plan Note (Signed)
Hold HCTZ/ACEI due to AKI.

## 2023-02-22 NOTE — ED Notes (Signed)
Pt cleaned up and bed pad changed and linen changed.

## 2023-02-22 NOTE — Subjective & Objective (Addendum)
CC: fall at home HPI: 89 year old male history of BPH, status post TURP, chronic back pain, hypertension who presents to the ER today after a fall at home.  He states that he has had a Foley catheter in for about a week.  Has been on p.o. Cipro.  He went to the urology office during the daytime to have the Foley catheter removed.  He was unable to urinate afterwards.  Foley catheter was replaced.  He was started on Flomax.  They picked up the prescription.  Patient's been constipated for several days has been taking MiraLAX.  He went to bed very early because he was tired.  He took an extra dose of Zanaflex.  He took 8 mg instead of 4 mg.  About 9 or 10:00 in the evening he felt a sudden urge to have a bowel movement.  He stood up from bed and subsequently fell over.  He had fecal incontinence/diarrhea.  His wife had to call 9 1 to get him up.  He was brought to the ER.  Upon further questioning, the patient is been taking Benadryl twice a day.  This seems to coincide with the patient's urinary retention.  Advised him to stop Benadryl.  On arrival temp 98.6 heart rate 69 blood pressure 129/59.  Labs showed a white count of 30.2, hemoglobin 13.2, platelets of 352  Sodium 131, potassium 3.4, BUN 25, creatinine 1.89, glucose 138  Lactic acid elevated 2.3  UA showed large cassette esterase, greater than 50 WBCs. CT chest abdomen pelvis showed cholelithiasis. No other intra abdominal or thoracic acute processes noted.  \CT head shows atrophy and small vessel ischemic disease. C-spine x-ray showed advanced disc disease. Left elbow x-ray was negative.  [facility] hospitalist contacted for admission.

## 2023-02-22 NOTE — Assessment & Plan Note (Signed)
Fulfills sepsis criteria. WBC 30K, UA positive for pyuria, Scr 1.89. baseline Scr 0.89.

## 2023-02-23 ENCOUNTER — Observation Stay (HOSPITAL_BASED_OUTPATIENT_CLINIC_OR_DEPARTMENT_OTHER): Payer: Medicare Other

## 2023-02-23 ENCOUNTER — Other Ambulatory Visit: Payer: Self-pay | Admitting: *Deleted

## 2023-02-23 ENCOUNTER — Encounter: Payer: Medicare Other | Admitting: Rehabilitative and Restorative Service Providers"

## 2023-02-23 ENCOUNTER — Encounter: Payer: Self-pay | Admitting: Radiology

## 2023-02-23 DIAGNOSIS — I6523 Occlusion and stenosis of bilateral carotid arteries: Secondary | ICD-10-CM

## 2023-02-23 DIAGNOSIS — N179 Acute kidney failure, unspecified: Secondary | ICD-10-CM | POA: Diagnosis present

## 2023-02-23 DIAGNOSIS — G8929 Other chronic pain: Secondary | ICD-10-CM | POA: Diagnosis present

## 2023-02-23 DIAGNOSIS — I129 Hypertensive chronic kidney disease with stage 1 through stage 4 chronic kidney disease, or unspecified chronic kidney disease: Secondary | ICD-10-CM | POA: Diagnosis present

## 2023-02-23 DIAGNOSIS — E785 Hyperlipidemia, unspecified: Secondary | ICD-10-CM | POA: Diagnosis present

## 2023-02-23 DIAGNOSIS — N189 Chronic kidney disease, unspecified: Secondary | ICD-10-CM | POA: Diagnosis not present

## 2023-02-23 DIAGNOSIS — N3000 Acute cystitis without hematuria: Secondary | ICD-10-CM | POA: Diagnosis present

## 2023-02-23 DIAGNOSIS — N401 Enlarged prostate with lower urinary tract symptoms: Secondary | ICD-10-CM | POA: Diagnosis present

## 2023-02-23 DIAGNOSIS — R55 Syncope and collapse: Secondary | ICD-10-CM | POA: Diagnosis present

## 2023-02-23 DIAGNOSIS — K5900 Constipation, unspecified: Secondary | ICD-10-CM | POA: Diagnosis present

## 2023-02-23 DIAGNOSIS — H9190 Unspecified hearing loss, unspecified ear: Secondary | ICD-10-CM | POA: Diagnosis present

## 2023-02-23 DIAGNOSIS — E041 Nontoxic single thyroid nodule: Secondary | ICD-10-CM | POA: Diagnosis present

## 2023-02-23 DIAGNOSIS — E876 Hypokalemia: Secondary | ICD-10-CM | POA: Diagnosis present

## 2023-02-23 DIAGNOSIS — K219 Gastro-esophageal reflux disease without esophagitis: Secondary | ICD-10-CM | POA: Diagnosis present

## 2023-02-23 DIAGNOSIS — R197 Diarrhea, unspecified: Secondary | ICD-10-CM | POA: Diagnosis not present

## 2023-02-23 DIAGNOSIS — E871 Hypo-osmolality and hyponatremia: Secondary | ICD-10-CM | POA: Diagnosis present

## 2023-02-23 DIAGNOSIS — Y846 Urinary catheterization as the cause of abnormal reaction of the patient, or of later complication, without mention of misadventure at the time of the procedure: Secondary | ICD-10-CM | POA: Diagnosis present

## 2023-02-23 DIAGNOSIS — M549 Dorsalgia, unspecified: Secondary | ICD-10-CM | POA: Diagnosis present

## 2023-02-23 DIAGNOSIS — I351 Nonrheumatic aortic (valve) insufficiency: Secondary | ICD-10-CM | POA: Diagnosis present

## 2023-02-23 DIAGNOSIS — I77810 Thoracic aortic ectasia: Secondary | ICD-10-CM | POA: Diagnosis present

## 2023-02-23 DIAGNOSIS — T83511A Infection and inflammatory reaction due to indwelling urethral catheter, initial encounter: Secondary | ICD-10-CM | POA: Diagnosis present

## 2023-02-23 DIAGNOSIS — J302 Other seasonal allergic rhinitis: Secondary | ICD-10-CM | POA: Diagnosis present

## 2023-02-23 DIAGNOSIS — W19XXXA Unspecified fall, initial encounter: Secondary | ICD-10-CM | POA: Diagnosis present

## 2023-02-23 DIAGNOSIS — N182 Chronic kidney disease, stage 2 (mild): Secondary | ICD-10-CM | POA: Diagnosis present

## 2023-02-23 DIAGNOSIS — M1990 Unspecified osteoarthritis, unspecified site: Secondary | ICD-10-CM | POA: Diagnosis present

## 2023-02-23 DIAGNOSIS — Z1152 Encounter for screening for COVID-19: Secondary | ICD-10-CM | POA: Diagnosis not present

## 2023-02-23 DIAGNOSIS — N3001 Acute cystitis with hematuria: Secondary | ICD-10-CM | POA: Diagnosis present

## 2023-02-23 DIAGNOSIS — S51012A Laceration without foreign body of left elbow, initial encounter: Secondary | ICD-10-CM | POA: Diagnosis present

## 2023-02-23 DIAGNOSIS — R001 Bradycardia, unspecified: Secondary | ICD-10-CM | POA: Diagnosis present

## 2023-02-23 LAB — BASIC METABOLIC PANEL
Anion gap: 9 (ref 5–15)
BUN: 8 mg/dL (ref 8–23)
CO2: 24 mmol/L (ref 22–32)
Calcium: 8.8 mg/dL — ABNORMAL LOW (ref 8.9–10.3)
Chloride: 98 mmol/L (ref 98–111)
Creatinine, Ser: 0.72 mg/dL (ref 0.61–1.24)
GFR, Estimated: >60 mL/min (ref >60)
Glucose, Bld: 116 mg/dL — ABNORMAL HIGH (ref 70–99)
Potassium: 3.8 mmol/L (ref 3.5–5.1)
Sodium: 131 mmol/L — ABNORMAL LOW (ref 135–145)

## 2023-02-23 LAB — ECHOCARDIOGRAM COMPLETE
Area-P 1/2: 4.12 cm2
Calc EF: 67.6 %
Height: 69 in
S' Lateral: 2.7 cm
Single Plane A2C EF: 67.2 %
Single Plane A4C EF: 66.8 %
Weight: 3200 oz

## 2023-02-23 LAB — CBC
HCT: 30.7 % — ABNORMAL LOW (ref 39.0–52.0)
Hemoglobin: 10.7 g/dL — ABNORMAL LOW (ref 13.0–17.0)
MCH: 32.7 pg (ref 26.0–34.0)
MCHC: 34.9 g/dL (ref 30.0–36.0)
MCV: 93.9 fL (ref 80.0–100.0)
Platelets: 270 10*3/uL (ref 150–400)
RBC: 3.27 MIL/uL — ABNORMAL LOW (ref 4.22–5.81)
RDW: 13.5 % (ref 11.5–15.5)
WBC: 11.9 10*3/uL — ABNORMAL HIGH (ref 4.0–10.5)
nRBC: 0 % (ref 0.0–0.2)

## 2023-02-23 LAB — URINE CULTURE: Culture: NO GROWTH

## 2023-02-23 MED ORDER — CHLORHEXIDINE GLUCONATE CLOTH 2 % EX PADS
6.0000 | MEDICATED_PAD | Freq: Every day | CUTANEOUS | Status: DC
  Administered 2023-02-23 – 2023-02-26 (×4): 6 via TOPICAL

## 2023-02-23 NOTE — Care Management Obs Status (Signed)
[facility] NOTIFICATION   Patient Details  Name: RHOEN LANGILL MRN: [id number] Date of Birth: 03-Feb-1934   Medicare Observation Status Notification Given:  Yes    Benard Halsted, LCSW 02/23/2023, 12:28 PM

## 2023-02-23 NOTE — Progress Notes (Signed)
PROGRESS NOTE   IBIN CIPRES  [id number]    DOB: [birth year]    DOA: 02/22/2023  PCP: Leonard Downing, MD   I have briefly reviewed patients previous medical records in [facility].  Chief Complaint  Patient presents with   Near Syncope    Brief Narrative:  89 year old married male, ambulates with the help of cane or a walker, medical history significant for BPH, s/p TURP, chronic back pain, hypertension, visual and hearing impaired, gait imbalance and falls, chronic Benadryl use, s/p recent Foley catheter placement for urinary retention, failed voiding trial at urologist office on day of admission, presented with fall at home, syncope, diarrhea x 1.  Admitted for diagnosis of syncope, acute kidney injury, suspected catheter associated urinary tract infection.   Assessment & Plan:  Principal Problem:   Acute kidney injury superimposed on chronic kidney disease ([place]) Active Problems:   UTI (urinary tract infection) due to urinary indwelling Foley catheter [facility])   Essential hypertension   Benign prostatic hyperplasia with lower urinary tract symptoms   GERD (gastroesophageal reflux disease)   Chronic back pain   Syncope and collapse   Acute kidney injury complicating stage II chronic kidney disease: Last known creatinine 0.75 in September 2022, GFR >80.  Presented with creatinine of 1.89.  AKI suspected due to dehydration, HCTZ, lisinopril,?  NSAID use.  Hold HCTZ and lisinopril.  Renal ultrasound without hydronephrosis.  S/p 2 L IV fluid bolus in the ED followed by maintenance IV fluids.  Avoid nephrotoxics.  AKI resolved.  Creatinine down to 0.72.  Syncope and collapse: Suspect multifactorial.  DD: Orthostasis,?  Bradycardia, polypharmacy including increased dose of Zanaflex, recently started Flomax and may be even vasovagal due to sudden urge for BM.  In ED, BP dropped from supine 129/59-117/51, no standing BPs recorded.  Telemetry shows sinus rhythm without  arrhythmias.  TTE from February 2021 showed normal EF and no aortic valve stenosis.  2D echo: LVEF 60-65%, Mild to moderate aortic valve regurgitation.  No aortic valve stenosis. Patient does not drive.  Acute diarrhea: Started on the night of hospital admission.  Had 3-4 episodes of explosive diarrhea yesterday.  On arrival to floor, rectal pouch was placed.  This morning it was empty.  Nursing removed pouch but subsequently patient had a small followed by a moderate-sized loose diarrhea.  Unclear etiology?  Acute viral versus food poisoning.  Supportive treatment.  Check GI panel PCR.  Low index of suspicion for C. difficile.  Hyponatremia: May be multifactorial due to recent diarrhea, GI losses but more likely due to HCTZ.  Stable.  At discharge, DC HCTZ indefinitely.  Mild dilatation of ascending aorta, measuring 44 mm, seen on 2D echo Outpatient follow-up with PCP and evaluation and management as deemed necessary.   Suspected catheter associated UTI: Continue IV ceftriaxone pending urine culture results (pending).  Sepsis ruled out.  Did not have sustained SIRS criteria except leukocytosis.  Day 3/3 IV ceftriaxone.  Hypokalemia: Replace and follow.  Given ongoing diarrhea, will check BMP in AM.  Leukocytosis: Suspected leukemoid reaction/stress response.  Predominantly neutrophilic.  Ordered peripheral smear review by pathology.  Leukocytosis has almost resolved.  BPH/acute urinary retention: Although chronic Benadryl may have contributed, not the only precipitating factor given history of prolonged use for approximately a year.  However since Benadryl not helping his upper respiratory congestion for which she was taking it, recommended stopping indefinitely.  Follows with Dr. Tresa Moore, [facility] and has an appointment for next Friday  to reassess voiding trial.  Continue Flomax and Proscar.  Gait ataxia and falls: Spouse reports 3 falls last year and a fall PTA.  Was getting  outpatient PT evaluation twice a week.  PT consulted and recommended SNF.  CT head and C-spine without acute findings.  Left shoulder restricted mobility: Noted after fall in May 2023.  Seen by Dr. Jean Rosenthal, orthopedics and referred to her next orthopedic surgeon,?  Shoulder replacement recommended but patient/family do not want to pursue surgery given his advanced age.  Essential hypertension: Hold HCTZ and lisinopril due to AKI.  As needed IV hydralazine.  Consider resuming lisinopril at discharge.  Incidental finding on CT C/A/P: 2.6 cm left thyroid nodule: As per recommendations, nonemergent thyroid ultrasound, this can be pursued as an outpatient via PCPs office.  Body mass index is 29.53 kg/m./Overweight    DVT prophylaxis: heparin injection 5,000 Units Start: 02/22/23 0800 SCDs Start: 02/22/23 0752  Subcutaneous heparin   Code Status: Full Code:  ACP Documents: None present. Family Communication: Spouse at bedside Disposition:  Discussed multiple times with [facility] RN who then discussed with the physician advisor on-call.  Given ongoing diarrhea, evaluation and management for same, care expected to cross 2 midnights, PA recommended changing to inpatient.     Consultants:     Procedures:     Antimicrobials:   As noted above.   Subjective:  Had 3-4 episodes of explosive diarrhea yesterday.  On arrival to floor, rectal pouch was placed.  This morning it was empty.  Nursing removed pouch but subsequently patient had a small followed by a moderate-sized loose diarrhea.  Per spouse, patient, spouse and unable to eat some barbecue chicken and even the neighbor had some GI upset.  Ate 100% of his meals.  Objective:   Vitals:   02/23/23 0000 02/23/23 0302 02/23/23 0849 02/23/23 1200  BP: (!) 109/45 (!) 106/40 134/83   Pulse: 68 60 65 82  Resp: '19 18 19 '$ (!) 24  Temp:  98 F (36.7 C) 98.3 F (36.8 C) 98.3 F (36.8 C)  TempSrc:  Axillary Oral   SpO2: 94% 94% 93%  96%  Weight:      Height:        General exam: Pleasant elderly male, moderately built and overweight lying comfortably propped up in bed without distress.  Oral mucosa moist.  Hard of hearing and has hearing aid in right ear.  Bilateral intraocular lens, blind in right eye and diminished visual acuity in the left eye. Respiratory system: Clear to auscultation.  No increased work of breathing. Cardiovascular system: S1 & S2 heard, RRR. No JVD, murmurs, rubs, gallops or clicks. No pedal edema.  Telemetry personally reviewed: Sinus rhythm, low voltage. Gastrointestinal system: Abdomen is nondistended, soft and nontender. No organomegaly or masses felt. Normal bowel sounds heard. GU: Has indwelling Foley catheter with amber-colored urine in the bag. Central nervous system: Alert and oriented. No focal neurological deficits. Extremities: Symmetric 5 x 5 power.  Tiny bruise over left elbow.  Also has a chronic or old appearing bruise over left anterior shoulder. Skin: No rashes, lesions or ulcers Psychiatry: Judgement and insight appear normal. Mood & affect appropriate.     Data Reviewed:   I have personally reviewed following labs and imaging studies   CBC: Recent Labs  Lab 02/22/23 0105 02/22/23 0903 02/23/23 0558  WBC 30.2* 21.3* 11.9*  NEUTROABS 27.0* 18.5*  --   HGB 13.2 11.3* 10.7*  HCT 37.9* 33.8* 30.7*  MCV 95.9  97.1 93.9  PLT 352 313 [id number]    Basic Metabolic Panel: Recent Labs  Lab 02/22/23 0105 02/22/23 0903 02/23/23 0558  NA 131* 132* 131*  K 3.4* 4.3 3.8  CL 93* 97* 98  CO2 '22 25 24  '$ GLUCOSE 137* 124* 116*  BUN 25* 22 8  CREATININE 1.89* 1.27* 0.72  CALCIUM 9.2 8.5* 8.8*  MG 1.8  --   --     Liver Function Tests: Recent Labs  Lab 02/22/23 0105  AST 27  ALT 7  ALKPHOS 54  BILITOT 0.9  PROT 6.8  ALBUMIN 3.5    CBG: No results for input(s): "GLUCAP" in the last 168 hours.  Microbiology Studies:   Recent Results (from the past 240 hour(s))  Resp  panel by RT-PCR (RSV, Flu A&B, Covid)     Status: None   Collection Time: 02/22/23  2:30 AM   Specimen: Nasal Swab  Result Value Ref Range Status   SARS Coronavirus 2 by RT PCR NEGATIVE NEGATIVE Final   Influenza A by PCR NEGATIVE NEGATIVE Final   Influenza B by PCR NEGATIVE NEGATIVE Final    Comment: (NOTE) The Xpert Xpress SARS-CoV-2/FLU/RSV plus assay is intended as an aid in the diagnosis of influenza from Nasopharyngeal swab specimens and should not be used as a sole basis for treatment. Nasal washings and aspirates are unacceptable for Xpert Xpress SARS-CoV-2/FLU/RSV testing.  Fact Sheet for Patients: EntrepreneurPulse.com.au  Fact Sheet for Healthcare Providers: IncredibleEmployment.be  This test is not yet approved or cleared by the [place] FDA and has been authorized for detection and/or diagnosis of SARS-CoV-2 by FDA under an Emergency Use Authorization (EUA). This EUA will remain in effect (meaning this test can be used) for the duration of the COVID-19 declaration under Section 564(b)(1) of the Act, 21 U.S.C. section 360bbb-3(b)(1), unless the authorization is terminated or revoked.     Resp Syncytial Virus by PCR NEGATIVE NEGATIVE Final    Comment: (NOTE) Fact Sheet for Patients: EntrepreneurPulse.com.au  Fact Sheet for Healthcare Providers: IncredibleEmployment.be  This test is not yet approved or cleared by the [place] FDA and has been authorized for detection and/or diagnosis of SARS-CoV-2 by FDA under an Emergency Use Authorization (EUA). This EUA will remain in effect (meaning this test can be used) for the duration of the COVID-19 declaration under Section 564(b)(1) of the Act, 21 U.S.C. section 360bbb-3(b)(1), unless the authorization is terminated or revoked.  Performed at [facility], [place] [address], [place], [place] [address]   Blood Culture (routine x 2)      Status: None (Preliminary result)   Collection Time: 02/22/23  2:30 AM   Specimen: BLOOD RIGHT HAND  Result Value Ref Range Status   Specimen Description BLOOD RIGHT HAND  Final   Special Requests   Final    BOTTLES DRAWN AEROBIC AND ANAEROBIC Blood Culture adequate volume   Culture   Final    NO GROWTH 1 DAY Performed at [facility], [place] [address], [place], [place] [address]    Report Status PENDING  Incomplete  Blood Culture (routine x 2)     Status: None (Preliminary result)   Collection Time: 02/22/23  2:35 AM   Specimen: BLOOD LEFT HAND  Result Value Ref Range Status   Specimen Description BLOOD LEFT HAND  Final   Special Requests   Final    BOTTLES DRAWN AEROBIC AND ANAEROBIC Blood Culture results may not be optimal due to an inadequate volume of  blood received in culture bottles   Culture   Final    NO GROWTH 1 DAY Performed at [facility], [place] [address] [place], [place] [address]    Report Status PENDING  Incomplete  Urine Culture     Status: None (Preliminary result)   Collection Time: 02/22/23  3:30 AM   Specimen: Urine, Catheterized  Result Value Ref Range Status   Specimen Description URINE, CATHETERIZED  Final   Special Requests   Final    NONE Reflexed from [id number] Performed at [facility], [place] [address], [place], [place] [address]    Culture PENDING  Incomplete   Report Status PENDING  Incomplete    Radiology Studies:  ECHOCARDIOGRAM COMPLETE  Result Date: 02/23/2023    ECHOCARDIOGRAM REPORT   Patient Name:   Mical D Robicheaux Date of Exam: 02/23/2023 Medical Rec #:  [id number]     Height:       69.0 in Accession #:    [id number]    Weight:       200.0 lb Date of Birth:  03-07-1934     BSA:          2.066 m Patient Age:    36 years      BP:           134/83 mmHg Patient Gender: M             HR:           63 bpm. Exam Location:  Inpatient Procedure: 2D Echo, Cardiac Doppler and Color Doppler Indications:    syncope  History:        Patient has  prior history of Echocardiogram examinations.                 Stroke; Risk Factors:Dyslipidemia and Hypertension.  Sonographer:    Phineas Douglas Referring Phys: [place]  1. Left ventricular ejection fraction, by estimation, is 60 to 65%. The left ventricle has normal function. The left ventricle has no regional Code motion abnormalities. There is moderate left ventricular hypertrophy. Left ventricular diastolic parameters are indeterminate.  2. Right ventricular systolic function is normal. The right ventricular size is normal. Tricuspid regurgitation signal is inadequate for assessing PA pressure.  3. Left atrial size was moderately dilated.  4. Right atrial size was mildly dilated.  5. The mitral valve is normal in structure. No evidence of mitral valve regurgitation.  6. The aortic valve is tricuspid. There is mild calcification of the aortic valve. There is mild thickening of the aortic valve. Aortic valve regurgitation is mild to moderate. Aortic valve sclerosis is present, with no evidence of aortic valve stenosis.  7. There is mild dilatation of the ascending aorta, measuring 44 mm.  8. The inferior vena cava is dilated in size with >50% respiratory variability, suggesting right atrial pressure of 8 mmHg. FINDINGS  Left Ventricle: Left ventricular ejection fraction, by estimation, is 60 to 65%. The left ventricle has normal function. The left ventricle has no regional Obriant motion abnormalities. The left ventricular internal cavity size was normal in size. There is  moderate left ventricular hypertrophy. Left ventricular diastolic parameters are indeterminate. Normal left ventricular filling pressure. Right Ventricle: The right ventricular size is normal. No increase in right ventricular Domeier thickness. Right ventricular systolic function is normal. Tricuspid regurgitation signal is inadequate for assessing PA pressure. Left Atrium: Left atrial size was moderately dilated. Right  Atrium: Right atrial size was mildly dilated.  Pericardium: There is no evidence of pericardial effusion. Mitral Valve: The mitral valve is normal in structure. No evidence of mitral valve regurgitation. Tricuspid Valve: The tricuspid valve is normal in structure. Tricuspid valve regurgitation is trivial. Aortic Valve: The aortic valve is tricuspid. There is mild calcification of the aortic valve. There is mild thickening of the aortic valve. Aortic valve regurgitation is mild to moderate. Aortic valve sclerosis is present, with no evidence of aortic valve stenosis. Pulmonic Valve: The pulmonic valve was grossly normal. Pulmonic valve regurgitation is mild. Aorta: The aortic root is normal in size and structure. There is mild dilatation of the ascending aorta, measuring 44 mm. Venous: The inferior vena cava is dilated in size with greater than 50% respiratory variability, suggesting right atrial pressure of 8 mmHg. IAS/Shunts: No atrial level shunt detected by color flow Doppler.  LEFT VENTRICLE PLAX 2D LVIDd:         4.40 cm      Diastology LVIDs:         2.70 cm      LV e' medial:    7.40 cm/s LV PW:         1.40 cm      LV E/e' medial:  11.0 LV IVS:        1.40 cm      LV e' lateral:   9.25 cm/s LVOT diam:     2.20 cm      LV E/e' lateral: 8.8 LV SV:         84 LV SV Index:   41 LVOT Area:     3.80 cm  LV Volumes (MOD) LV vol d, MOD A2C: 120.0 ml LV vol d, MOD A4C: 130.0 ml LV vol s, MOD A2C: 39.4 ml LV vol s, MOD A4C: 43.1 ml LV SV MOD A2C:     80.6 ml LV SV MOD A4C:     130.0 ml LV SV MOD BP:      87.0 ml RIGHT VENTRICLE             IVC RV Basal diam:  4.00 cm     IVC diam: 2.40 cm RV S prime:     26.90 cm/s TAPSE (M-mode): 3.4 cm LEFT ATRIUM             Index        RIGHT ATRIUM           Index LA diam:        4.70 cm 2.27 cm/m   RA Area:     22.30 cm LA Vol (A2C):   72.7 ml 35.19 ml/m  RA Volume:   58.00 ml  28.07 ml/m LA Vol (A4C):   55.0 ml 26.62 ml/m LA Biplane Vol: 64.3 ml 31.12 ml/m  AORTIC VALVE  LVOT Vmax:   139.00 cm/s LVOT Vmean:  73.500 cm/s LVOT VTI:    0.221 m  AORTA Ao Root diam: 3.90 cm Ao Asc diam:  4.40 cm MITRAL VALVE MV Area (PHT): 4.12 cm    SHUNTS MV Decel Time: 184 msec    Systemic VTI:  0.22 m MV E velocity: 81.50 cm/s  Systemic Diam: 2.20 cm MV A velocity: 78.20 cm/s MV E/A ratio:  1.04 Mihai Croitoru MD Electronically signed by Sanda Klein MD Signature Date/Time: 02/23/2023/2:25:04 PM    Final    CT CHEST ABDOMEN PELVIS WO CONTRAST  Result Date: 02/22/2023 CLINICAL DATA:  Sepsis EXAM: CT CHEST, ABDOMEN AND PELVIS WITHOUT CONTRAST TECHNIQUE: Multidetector CT  imaging of the chest, abdomen and pelvis was performed following the standard protocol without IV contrast. RADIATION DOSE REDUCTION: This exam was performed according to the departmental dose-optimization program which includes automated exposure control, adjustment of the mA and/or kV according to patient size and/or use of iterative reconstruction technique. COMPARISON:  05/27/2010 FINDINGS: CT CHEST FINDINGS Cardiovascular: Heart is normal size. Aorta is normal caliber. Coronary artery and aortic calcifications. Mediastinum/Nodes: No mediastinal, hilar, or axillary adenopathy. Trachea and esophagus are unremarkable. 2.6 cm solid-appearing nodule in the left thyroid lobe. Lungs/Pleura: Bibasilar atelectasis. Otherwise no confluent opacities or effusions. Musculoskeletal: Chest Bogard soft tissues are unremarkable. No acute bony abnormality. Spinal stimulator in place in the lower thoracic spine. CT ABDOMEN PELVIS FINDINGS Hepatobiliary: Small gallstones within the gallbladder. No biliary ductal dilatation or focal hepatic abnormality. Pancreas: No focal abnormality or ductal dilatation. Spleen: Benign appearing calcifications in the spleen.  Normal size. Adrenals/Urinary Tract: No renal or adrenal mass. No stones or hydronephrosis. Urinary bladder decompressed with Foley catheter in place. Stomach/Bowel: Diffuse colonic  diverticulosis, most pronounced in the sigmoid colon. No active diverticulitis. Stomach and small bowel decompressed. No bowel obstruction. Vascular/Lymphatic: Aortic atherosclerosis. No evidence of aneurysm or adenopathy. Reproductive: No visible focal abnormality. Other: No free fluid or free air. Musculoskeletal: No acute bony abnormality. Postoperative changes and degenerative changes in the lumbar spine. IMPRESSION: No acute findings in the chest, abdomen or pelvis. Coronary artery disease, aortic atherosclerosis. Bibasilar atelectasis. Cholelithiasis.  No CT evidence of acute cholecystitis. Colonic diverticulosis.  No active diverticulitis. 2.6 cm left thyroid nodule. Recommend non emergent thyroid US (ref: J Am Coll Radiol. 2015 Feb;12(2): 143-50). Electronically Signed   By: Rolm Baptise M.D.   On: 02/22/2023 03:34   CT CERVICAL SPINE WO CONTRAST  Result Date: 02/22/2023 CLINICAL DATA:  Neck trauma (Age >= 65y).  Syncope EXAM: CT CERVICAL SPINE WITHOUT CONTRAST TECHNIQUE: Multidetector CT imaging of the cervical spine was performed without intravenous contrast. Multiplanar CT image reconstructions were also generated. RADIATION DOSE REDUCTION: This exam was performed according to the departmental dose-optimization program which includes automated exposure control, adjustment of the mA and/or kV according to patient size and/or use of iterative reconstruction technique. COMPARISON:  None Available. FINDINGS: Alignment: No subluxation Skull base and vertebrae: No acute fracture. No primary bone lesion or focal pathologic process. Soft tissues and spinal canal: No prevertebral fluid or swelling. No visible canal hematoma. 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 Diffuse advanced degenerative disc disease and facet disease. Upper chest: No acute findings Other: None IMPRESSION: Advanced diffuse degenerative disc and facet disease. No acute bony abnormality. Electronically Signed   By: Rolm Baptise M.D.   On:  02/22/2023 03:30   CT HEAD WO CONTRAST  Result Date: 02/22/2023 CLINICAL DATA:  Syncope EXAM: CT HEAD WITHOUT CONTRAST TECHNIQUE: Contiguous axial images were obtained from the base of the skull through the vertex without intravenous contrast. RADIATION DOSE REDUCTION: This exam was performed according to the departmental dose-optimization program which includes automated exposure control, adjustment of the mA and/or kV according to patient size and/or use of iterative reconstruction technique. COMPARISON:  02/04/2020 FINDINGS: Brain: There is atrophy and chronic small vessel disease changes. No acute intracranial abnormality. Specifically, no hemorrhage, hydrocephalus, mass lesion, acute infarction, or significant intracranial injury. Vascular: No hyperdense vessel or unexpected calcification. Skull: No acute calvarial abnormality. Sinuses/Orbits: No acute findings Other: None IMPRESSION: Atrophy, chronic microvascular disease. No acute intracranial abnormality. Electronically Signed   By: Rolm Baptise M.D.  On: 02/22/2023 03:28   DG Elbow 2 Views Left  Result Date: 02/22/2023 CLINICAL DATA:  Fall EXAM: LEFT ELBOW - 2 VIEW COMPARISON:  None Available. FINDINGS: No acute bony abnormality. Specifically, no fracture, subluxation, or dislocation. No joint effusion. Soft tissues are intact. IMPRESSION: No acute bony abnormality. Electronically Signed   By: Rolm Baptise M.D.   On: 02/22/2023 01:31   DG Chest Port 1 View  Result Date: 02/22/2023 CLINICAL DATA:  Fall, syncope EXAM: PORTABLE CHEST 1 VIEW COMPARISON:  07/06/2021 FINDINGS: Heart is mildly enlarged. Vascular congestion. Bibasilar atelectasis. No overt edema, effusions or pneumothorax. No acute bony abnormality. IMPRESSION: Cardiomegaly, vascular congestion. Bibasilar atelectasis. Electronically Signed   By: Rolm Baptise M.D.   On: 02/22/2023 01:31    Scheduled Meds:    atorvastatin  40 mg Oral QHS   Chlorhexidine Gluconate Cloth  6 each Topical  Daily   finasteride  5 mg Oral QHS   heparin  5,000 Units Subcutaneous Q8H   tamsulosin  0.4 mg Oral QHS    Continuous Infusions:    cefTRIAXone (ROCEPHIN)  IV Stopped (02/22/23 1538)     LOS: 0 days     Vernell Leep, MD,  FACP, FHM, [facility], [facility], [place]     To contact the attending provider between 7A-7P or the covering provider during after hours 7P-7A, please log into the web site [email or web address] and access using universal [place] password for that web site. If you do not have the password, please call the hospital operator.  02/23/2023, 3:36 PM

## 2023-02-23 NOTE — TOC Initial Note (Signed)
Transition of Care [facility]) - Initial/Assessment Note    Patient Details  Name: Jay Tran MRN: [id number] Date of Birth: 1934/08/16  Transition of Care [facility]) CM/SW Contact:    Benard Halsted, LCSW Phone Number: 02/23/2023, 12:31 PM  Clinical Narrative:                 CSW met with patient, spouse, and daughter at bedside to discuss discharge options. CSW explained that patient does not meet Medicare guidelines for SNF placement. CSW explained options of home health services and private duty nursing care. Spouse is requesting SNF and will pay out of pocket. CSW explained likely cost of around $10,000 up front for one month, not including therapy. CSW will send out referrals for review and provide bed offers and facility quotes as available. Their first choice is [facility].  -CSW sent request to [facility] unable to accept patient.   Skilled Nursing Rehab Facilities-   [email or web address]   Ratings out of 5 stars (5 the highest)   Name Address  Phone # [place] Inspection Overall  [facility] [address], [address] '4 5 2 3  '$ [facility]  [address] [place], [place] [phone] '4 2 5 5  '$ [facility], [place] '1 3 1 1  '$ [place] [facility] [place], [place] '2 2 4 4  '$ [facility] [address] [place] '2 1 2 1  '$ [place] '3 3 4 4  '$ [facility] [address] [place] '4 1 3 2  '$ [facility] [address], [place] '4 1 3 2  '$ [address] ([place]) [place], [place] [phone] '3 1 2 1  '$ [facility] [phone], [place] [phone] '3 1 1 1  '$ [facility] [address] [facility] [phone] '3 2 2 2  '$ [facility] [place]. [place], [place]  [phone] '3 1 1 1  '$ Dustin Flock [address] [id number] '4 2 4 4          '$ [place] [address] [place] '4 1 3 2  '$ [facility] [address], [place] '3 1 5 4  '$ [place], [place] [phone] '1 1 2 1  '$ [facility] [address] [employer] [phone] '2 2 4 4          '$ [address] (no [facility]) [address], [place] [phone] '5 5 5 5  '$ [facility] (No Humana) [address], [place] '4 1 4 3  '$ [facility] (No UHC) [place], [place] '5 5 5 5  '$ [facility] [address], [employer] [phone] '2 3 5 5  '$ [place] [address] [place] '1 1 2 1  '$ [place] [address], [place] [id number] '3 1 1 1  '$ [place], [place] '5 2 5 5  '$ [facility]  [address] [place] '2 2 1 1  '$ [facility] [address] [place] '3 2 1 1  '$ [facility] [place], [place] '2 2 2 2          '$ [facility] [address], [place] '1 1 1 '$ 1  [place] [address], [place]  [phone] '2 4 3 3  '$ [facility] [address], [place] [phone] '3 2 3 3  '$ [place] [address], [place] '2 1 1 1  '$ [place] (No Humana) [address], [place] [phone] '2 2 3 3  '$ [facility]) [address], [place] [phone] '2 1 1 1          '$ [facility] [place], [place] '5 4 5 5  '$ [facility])  [phone], [place] '2 1 2 1  '$ [facility]) [place] [address] [place] '3 1 4 3  '$ [place] [address], [place] '3 3 4 4  '$ [address] [place], [place] '2 3 1 1  '$ [facility]) [address] [place] [phone] '2 1 4 3     '$ Expected Discharge Plan: [place] Barriers to Discharge: Continued Medical Work up, Inadequate or no insurance, SNF Pending bed offer   Patient Goals and CMS Choice Patient states their goals for this hospitalization and ongoing recovery are:: Rehab CMS Medicare.gov Compare Post Acute Care list provided to:: Patient Choice offered to / list presented to : Patient, Spouse, Adult [place] ownership interest in [employer] [facility].provided to:: Spouse    Expected Discharge Plan and Services In-house Referral: Clinical Social Work   Post Acute Care Choice: [place] Living arrangements for the past 2 months: [place]                                      Prior Living Arrangements/Services Living arrangements for the past 2 months: Single Family Home Lives with:: Spouse Patient language and need for interpreter reviewed:: Yes Do you feel safe going back to the place where you live?: Yes      Need for Family Participation in Patient Care: Yes (Comment) Care giver support system in place?: Yes (comment) Current home services: DME (walker) Criminal Activity/Legal Involvement Pertinent to Current Situation/Hospitalization: No - Comment as needed  Activities of Daily Living Home Assistive Devices/Equipment: Eyeglasses, Hearing aid, Walker (specify type) ADL Screening (condition at time of admission) Patient's cognitive ability adequate to safely complete daily activities?: Yes Is the patient deaf or have difficulty hearing?: No Does the patient have difficulty seeing, even when wearing glasses/contacts?: No Does the patient have difficulty concentrating, remembering, or making decisions?: No Patient able to express need for assistance with ADLs?: Yes Does the patient have difficulty dressing or  bathing?: Yes Independently performs ADLs?: Yes (appropriate for developmental age) Communication: Needs assistance Is this a change from baseline?: Pre-admission baseline Does the patient have difficulty walking or climbing stairs?: Yes Weakness of Legs: None Weakness of Arms/Hands: Left  Permission Sought/Granted Permission sought to share information with : Facility Sport and exercise psychologist, Family Supports Permission granted to share information with : Yes, Verbal Permission Granted  Share Information with NAME: Ar[place] Go  Permission granted to share info w AGENCY: SNFs  Permission granted to share info w Relationship: Spouse  Permission granted to share info w Contact Information: [phone]  Emotional Assessment Appearance:: Appears stated age Attitude/Demeanor/Rapport: Engaged Affect (typically observed): Accepting, Appropriate, Pleasant Orientation: : Oriented to Self, Oriented to Place, Oriented to  Time, Oriented to Situation (Hard of hearing) Alcohol / Substance Use: Not  Applicable Psych Involvement: No (comment)  Admission diagnosis:  Syncope and collapse [R55] Acute cystitis without hematuria [N30.00] AKI (acute kidney injury) ([place]) [N17.9] Urinary tract infection with hematuria, site unspecified [N39.0, R31.9] Sepsis, due to unspecified organism, unspecified whether acute organ dysfunction present [facility]) [A41.9] Patient Active Problem List   Diagnosis Date Noted   UTI (urinary tract infection) due to urinary indwelling Foley catheter ([place]) 02/22/2023   Syncope and collapse 02/22/2023   Acute kidney injury superimposed on chronic kidney disease ([facility]) 02/22/2023   Unilateral primary osteoarthritis, right hip 03/30/2022   Prostatic hypertrophy 11/20/2020   Internal carotid artery stenosis 02/05/2020   Essential hypertension 02/05/2020   Benign prostatic hyperplasia with lower urinary tract symptoms 02/05/2020   GERD (gastroesophageal reflux disease) 02/05/2020   Chronic  back pain 02/05/2020   Vision loss of right eye 02/04/2020   Wound drainage 08/20/2018   Chronic pain of both shoulders 02/22/2017   S/P lumbar spinal fusion 04/15/2015   HNP (herniated nucleus pulposus), lumbar 10/26/2012    Class: Diagnosis of   PCP:  Leonard Downing, MD Pharmacy:   [facility] ([facility] [place] [id number] [place] [address] Phone: [phone] Fax: [phone]     Social Determinants of Health (SDOH) Social History: [place]: No Food Insecurity (02/22/2023)  Housing: Low Risk  (02/22/2023)  Transportation Needs: No Transportation Needs (02/22/2023)  Utilities: Not At Risk (02/22/2023)  Tobacco Use: Medium Risk (02/22/2023)   SDOH Interventions:     Readmission Risk Interventions     No data to display

## 2023-02-23 NOTE — Progress Notes (Signed)
CSW acknowledges SNF consult. Patient currently in observation status and does not qualify for SNF through Medicare guidelines (and is not a [facility] participant). CSW will follow for potential change in status that would meet three night inpatient stay requirements.   Gilmore Laroche, MSW, [facility]

## 2023-02-23 NOTE — TOC Progression Note (Addendum)
Transition of Care [facility]) - Progression Note    Patient Details  Name: Jay Tran MRN: [id number] Date of Birth: 02-21-1934  Transition of Care [facility]) CM/SW [place], LCSW Phone Number: 02/23/2023, 1:49 PM  Clinical Narrative:    1:49pm-CSW provided 3 SNF bed offers and price quotes to spouse. She would like to hear back from [facility].   2:35pm-CSW made spouse and daughter aware of offer from [facility] and price quote. Per MD, now not discharging today. Will follow up.    Expected Discharge Plan: [place] Barriers to Discharge: Continued Medical Work up, Inadequate or no insurance, SNF Pending bed offer  Expected Discharge Plan and Services In-house Referral: Clinical Social Work   Post Acute Care Choice: [place] Living arrangements for the past 2 months: Single Family Home                                       Social Determinants of Health (SDOH) Interventions SDOH Screenings   Food Insecurity: No Food Insecurity (02/22/2023)  Housing: Low Risk  (02/22/2023)  Transportation Needs: No Transportation Needs (02/22/2023)  Utilities: Not At Risk (02/22/2023)  Tobacco Use: Medium Risk (02/22/2023)    Readmission Risk Interventions     No data to display

## 2023-02-23 NOTE — NC FL2 (Signed)
[place] MEDICAID FL2 LEVEL OF CARE FORM     IDENTIFICATION  Patient Name: Jay Tran Birthdate: [birth year] Sex: male Admission Date (Current Location): 02/22/2023  [facility] and [place] Number:  Herbalist and Address:  [facility], [place] [address] [place], [place] [address]      Provider Number: [id number]  Attending Physician Name and Address:  Modena Jansky, MD  Relative Name and Phone Number:       Current Level of Care: Hospital Recommended Level of Care: [place] Prior Approval Number:    Date Approved/Denied:   PASRR Number: [id number]  Discharge Plan: SNF    Current Diagnoses: Patient Active Problem List   Diagnosis Date Noted   UTI (urinary tract infection) due to urinary indwelling Foley catheter ([place]) 02/22/2023   Syncope and collapse 02/22/2023   Acute kidney injury superimposed on chronic kidney disease ([place]) 02/22/2023   Unilateral primary osteoarthritis, right hip 03/30/2022   Prostatic hypertrophy 11/20/2020   Internal carotid artery stenosis 02/05/2020   Essential hypertension 02/05/2020   Benign prostatic hyperplasia with lower urinary tract symptoms 02/05/2020   GERD (gastroesophageal reflux disease) 02/05/2020   Chronic back pain 02/05/2020   Vision loss of right eye 02/04/2020   Wound drainage 08/20/2018   Chronic pain of both shoulders 02/22/2017   S/P lumbar spinal fusion 04/15/2015   HNP (herniated nucleus pulposus), lumbar 10/26/2012    Orientation RESPIRATION BLADDER Height & Weight     Self, Time, Situation, Place  Normal Continent, Indwelling catheter Weight: 200 lb (90.7 kg) Height:  '5\' 9"'$  (175.3 cm)  BEHAVIORAL SYMPTOMS/MOOD NEUROLOGICAL BOWEL NUTRITION STATUS      Incontinent (rectal pouch) Diet (See dc summary)  AMBULATORY STATUS COMMUNICATION OF NEEDS Skin   Limited Assist Verbally Normal                       Personal Care Assistance Level of Assistance   Bathing, Feeding, Dressing Bathing Assistance: Maximum assistance Feeding assistance: Limited assistance Dressing Assistance: Limited assistance     Functional Limitations Info  Sight, Hearing Sight Info: Impaired Hearing Info: Impaired      SPECIAL CARE FACTORS FREQUENCY  PT (By licensed PT), OT (By licensed OT)     PT Frequency: Eval and treat OT Frequency: Eval and treat            Contractures Contractures Info: Not present    Additional Factors Info  Code Status, Allergies Code Status Info: Full Allergies Info: Codeine, Other, Tramadol           Current Medications (02/23/2023):  This is the current hospital active medication list Current Facility-Administered Medications  Medication Dose Route Frequency Provider Last Rate Last Admin   acetaminophen (TYLENOL) tablet 650 mg  650 mg Oral Q6H PRN Kristopher Oppenheim, DO   650 mg at 02/23/23 0631   Or   acetaminophen (TYLENOL) suppository 650 mg  650 mg Rectal Q6H PRN Kristopher Oppenheim, DO       atorvastatin (LIPITOR) tablet 40 mg  40 mg Oral QHS Kristopher Oppenheim, DO   40 mg at 02/22/23 2011   cefTRIAXone (ROCEPHIN) 1 g in sodium chloride 0.9 % 100 mL IVPB  1 g Intravenous Q24H Modena Jansky, MD   Stopped at 02/22/23 1538   Chlorhexidine Gluconate Cloth 2 % PADS 6 each  6 each Topical Daily Hongalgi, Lenis Dickinson, MD       finasteride (PROSCAR) tablet 5 mg  5 mg Oral QHS Kristopher Oppenheim, DO   5 mg at 02/22/23 2011   heparin injection 5,000 Units  5,000 Units Subcutaneous Q8H Kristopher Oppenheim, DO   5,000 Units at 02/23/23 0541   hydrALAZINE (APRESOLINE) injection 10 mg  10 mg Intravenous Q6H PRN Modena Jansky, MD   10 mg at 02/22/23 1622   loperamide (IMODIUM) capsule 2 mg  2 mg Oral TID PRN Modena Jansky, MD   2 mg at 02/22/23 1506   ondansetron (ZOFRAN) tablet 4 mg  4 mg Oral Q6H PRN Kristopher Oppenheim, DO       Or   ondansetron [facility]) injection 4 mg  4 mg Intravenous Q6H PRN Kristopher Oppenheim, DO       phenylephrine-shark liver oil-mineral  oil-petrolatum (PREPARATION H) rectal ointment 1 Application  1 Application Rectal BID PRN Hongalgi, Lenis Dickinson, MD       polyvinyl alcohol (LIQUIFILM TEARS) 1.4 % ophthalmic solution 1 drop  1 drop Both Eyes TID PRN Hongalgi, Lenis Dickinson, MD       tamsulosin (FLOMAX) capsule 0.4 mg  0.4 mg Oral QHS Hongalgi, Anand D, MD   0.4 mg at 02/22/23 2010   tiZANidine (ZANAFLEX) tablet 4 mg  4 mg Oral Q8H PRN Kristopher Oppenheim, DO   4 mg at 02/22/23 2118     Discharge Medications: Please see discharge summary for a list of discharge medications.  Relevant Imaging Results:  Relevant Lab Results:   Additional Information SSN: [place], [place]

## 2023-02-23 NOTE — Progress Notes (Signed)
Echocardiogram 2D Echocardiogram has been performed.  Jay Tran 02/23/2023, 12:00 PM

## 2023-02-24 ENCOUNTER — Inpatient Hospital Stay (HOSPITAL_COMMUNITY): Payer: Medicare Other

## 2023-02-24 LAB — GASTROINTESTINAL PANEL BY PCR, STOOL (REPLACES STOOL CULTURE)

## 2023-02-24 LAB — CBC
HCT: 35.4 % — ABNORMAL LOW (ref 39.0–52.0)
Hemoglobin: 12.2 g/dL — ABNORMAL LOW (ref 13.0–17.0)
MCH: 32.4 pg (ref 26.0–34.0)
MCHC: 34.5 g/dL (ref 30.0–36.0)
MCV: 94.1 fL (ref 80.0–100.0)
Platelets: 366 10*3/uL (ref 150–400)
RBC: 3.76 MIL/uL — ABNORMAL LOW (ref 4.22–5.81)
RDW: 13.4 % (ref 11.5–15.5)
WBC: 10.2 10*3/uL (ref 4.0–10.5)
nRBC: 0 % (ref 0.0–0.2)

## 2023-02-24 LAB — BASIC METABOLIC PANEL
Anion gap: 14 (ref 5–15)
BUN: 8 mg/dL (ref 8–23)
CO2: 21 mmol/L — ABNORMAL LOW (ref 22–32)
Calcium: 9.5 mg/dL (ref 8.9–10.3)
Chloride: 97 mmol/L — ABNORMAL LOW (ref 98–111)
Creatinine, Ser: 0.75 mg/dL (ref 0.61–1.24)
GFR, Estimated: >60 mL/min (ref >60)
Glucose, Bld: 171 mg/dL — ABNORMAL HIGH (ref 70–99)
Potassium: 3.5 mmol/L (ref 3.5–5.1)
Sodium: 132 mmol/L — ABNORMAL LOW (ref 135–145)

## 2023-02-24 MED ORDER — MELATONIN 3 MG PO TABS
3.0000 mg | ORAL_TABLET | Freq: Every evening | ORAL | Status: DC | PRN
  Administered 2023-02-24: 3 mg via ORAL
  Filled 2023-02-24: qty 1

## 2023-02-24 MED ORDER — LIDOCAINE 5 % EX PTCH
1.0000 | MEDICATED_PATCH | CUTANEOUS | Status: DC
  Administered 2023-02-24 – 2023-02-25 (×2): 1 via TRANSDERMAL
  Filled 2023-02-24 (×2): qty 1

## 2023-02-24 MED ORDER — LACTATED RINGERS IV SOLN: INTRAVENOUS | Status: DC

## 2023-02-24 MED ORDER — AMLODIPINE BESYLATE 5 MG PO TABS
5.0000 mg | ORAL_TABLET | Freq: Every day | ORAL | Status: DC
  Administered 2023-02-24 – 2023-02-26 (×3): 5 mg via ORAL
  Filled 2023-02-24 (×3): qty 1

## 2023-02-24 NOTE — Progress Notes (Signed)
Physical Therapy Treatment Patient Details Name: Jay Tran MRN: [id number] DOB: [birth year] Today's Date: 02/24/2023   History of Present Illness 89 y.o. male [facility] hospital on [facility]/22/2023 after fall at home with associated syncope and diarrhea. Pt also hound to have AKI and suspected UTI. PMH includes BPH, chronic back pain, HTN, gait instability, GERD.    PT Comments    Pt tolerated today's session well with good progress demonstrated. Pt continuing to require assistance with all mobility, modA for bed, minA to stand, and minG for ambulation with use of RW but able to progress ambulation to the hallway. Discussed current levels of assist with pt and his wife, all in agreement that pt would benefit from SNF until more independent to return home. Pt will continue to benefit from skilled acute PT to progress mobility, strength, and balance, current discharge recommendations remain appropriate.     Recommendations for follow up therapy are one component of a multi-disciplinary discharge planning process, led by the attending physician.  Recommendations may be updated based on patient status, additional functional criteria and insurance authorization.  Follow Up Recommendations  Skilled nursing-short term rehab (<3 hours/day) Can patient physically be transported by private vehicle: No   Assistance Recommended at Discharge Frequent or constant Supervision/Assistance  Patient can return home with the following A little help with walking and/or transfers;A lot of help with bathing/dressing/bathroom;Assistance with cooking/housework;Assist for transportation;Help with stairs or ramp for entrance   Equipment Recommendations  None recommended by PT    Recommendations for Other Services       Precautions / Restrictions Precautions Precautions: Fall Restrictions Weight Bearing Restrictions: No     Mobility  Bed Mobility Overal bed mobility: Needs Assistance Bed Mobility: Rolling,  Sidelying to Sit Rolling: Min assist Sidelying to sit: Mod assist, HOB elevated       General bed mobility comments: use of hand rails, rolling with minA left and right for clean-up, roll to sidelying to sitting with modA for BLE management and trunk management    Transfers Overall transfer level: Needs assistance Equipment used: Rolling walker (2 wheels) Transfers: Sit to/from Stand Sit to Stand: Min assist           General transfer comment: minA to stand for power up and steady from bed and chair. Cued for proper hand placement with good carryover with repetition. Intermittent minG for standing but minA overall    Ambulation/Gait Ambulation/Gait assistance: Min guard Gait Distance (Feet): 75 Feet (with 1 standing rest break) Assistive device: Rolling walker (2 wheels) Gait Pattern/deviations: Step-through pattern, Decreased stride length, Trunk flexed, Drifts right/left Gait velocity: decreased     General Gait Details: intermittently varying gait speed, cued for forward gaze and upright posture with proximity to RW, pt correcting but reverting back to trunk flexion and downward gaze with fatigue.   Stairs             Wheelchair Mobility    Modified Rankin (Stroke Patients Only)       Balance Overall balance assessment: Needs assistance Sitting-balance support: No upper extremity supported, Feet supported Sitting balance-Leahy Scale: Fair Sitting balance - Comments: stable sitting   Standing balance support: Bilateral upper extremity supported, During functional activity, Reliant on assistive device for balance Standing balance-Leahy Scale: Poor Standing balance comment: reliant on RW for support with assistance for balance  Cognition Arousal/Alertness: Awake/alert Behavior During Therapy: WFL for tasks assessed/performed Overall Cognitive Status: Within Functional Limits for tasks assessed                                  General Comments: pleasant throughout session, mild HOH with hearing aids in        Exercises Other Exercises Other Exercises: sit<>stand x5 reps for BLE strengthening    General Comments General comments (skin integrity, edema, etc.): VSS on room air      Pertinent Vitals/Pain Pain Assessment Pain Assessment: Faces Faces Pain Scale: Hurts a little bit Pain Location: generalized Pain Descriptors / Indicators: Aching Pain Intervention(s): Monitored during session, Limited activity within patient's tolerance    Home Living                          Prior Function            PT Goals (current goals can now be found in the care plan section) Acute Rehab PT Goals Patient Stated Goal: to improve strength, return to ambulation and reduce falls risk PT Goal Formulation: With patient Time For Goal Achievement: 03/08/23 Potential to Achieve Goals: Fair Progress towards PT goals: Progressing toward goals    Frequency    Min 3X/week      PT Plan Current plan remains appropriate    Co-evaluation              AM-PAC PT "6 Clicks" Mobility   Outcome Measure  Help needed turning from your back to your side while in a flat bed without using bedrails?: A Little Help needed moving from lying on your back to sitting on the side of a flat bed without using bedrails?: A Lot Help needed moving to and from a bed to a chair (including a wheelchair)?: A Little Help needed standing up from a chair using your arms (e.g., wheelchair or bedside chair)?: A Little Help needed to walk in hospital room?: A Little Help needed climbing 3-5 steps with a railing? : Total 6 Click Score: 15    End of Session Equipment Utilized During Treatment: Gait belt Activity Tolerance: Patient tolerated treatment well Patient left: in chair;with call bell/phone within reach;with family/visitor present (chair alarm under pt but box not in room, RN notified but wife  present) Nurse Communication: Mobility status PT Visit Diagnosis: Other abnormalities of gait and mobility (R26.89);Muscle weakness (generalized) (M62.81);History of falling (Z91.81)     Time: [id number] PT Time Calculation (min) (ACUTE ONLY): 31 min  Charges:  $Gait Training: 8-22 mins $Therapeutic Activity: 8-22 mins                     Charlynne Cousins, PT DPT Acute Rehabilitation Services Office [phone]    Jay Tran 02/24/2023, 9:26 AM

## 2023-02-24 NOTE — Progress Notes (Signed)
PROGRESS NOTE   Jay Tran  [id number]    DOB: Mar 04, 1934    DOA: 02/22/2023  PCP: Leonard Downing, MD   I have briefly reviewed patients previous medical records in [facility].  Chief Complaint  Patient presents with   Near Syncope    Brief Narrative:  89 year old married male, ambulates with the help of cane or a walker, medical history significant for BPH, s/p TURP, chronic back pain, hypertension, visual and hearing impaired, gait imbalance and falls, chronic Benadryl use, s/p recent Foley catheter placement for urinary retention, failed voiding trial at urologist office on day of admission, presented with fall at home, syncope, diarrhea x 1.  Admitted for diagnosis of syncope, acute kidney injury, suspected catheter associated urinary tract infection.  Course complicated by acute diarrhea.   Assessment & Plan:  Principal Problem:   Acute kidney injury superimposed on chronic kidney disease (English) Active Problems:   UTI (urinary tract infection) due to urinary indwelling Foley catheter [facility])   Essential hypertension   Benign prostatic hyperplasia with lower urinary tract symptoms   GERD (gastroesophageal reflux disease)   Chronic back pain   Syncope and collapse   Acute cystitis without hematuria   Acute kidney injury complicating stage II chronic kidney disease: Last known creatinine 0.75 in September 2022, GFR >80.  Presented with creatinine of 1.89.  AKI suspected due to dehydration, HCTZ, lisinopril,?  NSAID use.  Hold HCTZ and lisinopril.  Renal ultrasound without hydronephrosis.  S/p 2 L IV fluid bolus in the ED followed by maintenance IV fluids.  Avoid nephrotoxics.  AKI resolved.  Creatinine down to 0.72.  Repeat MP pending.  Unchanged  Syncope and collapse: Suspect multifactorial.  DD: Orthostasis,?  Bradycardia, polypharmacy including increased dose of Zanaflex, recently started Flomax and may be even vasovagal due to sudden urge for BM.  In ED, BP  dropped from supine 129/59-117/51, no standing BPs recorded.  Telemetry shows sinus rhythm without arrhythmias.  TTE from February 2021 showed normal EF and no aortic valve stenosis.  2D echo: LVEF 60-65%, Mild to moderate aortic valve regurgitation.  No aortic valve stenosis. Patient does not drive.  Acute diarrhea: Started on the night of hospital admission.  Had 3-4 episodes of explosive diarrhea yesterday and multiple (3-4) episodes overnight.  Unclear etiology?  Acute viral versus food poisoning.  Supportive treatment.  GI panel please negative,.  Low index of suspicion for C. difficile.  Witnessed to in bedside commode, soft.  Another concern may be overflow incontinence.  Will check KUB for stool burden.  Will start brief gentle IV fluids due to poor oral intake.  Trial of Imodium.  Hyponatremia: May be multifactorial due to recent diarrhea, GI losses but more likely due to HCTZ.  Stable.  At discharge, DC HCTZ indefinitely.  Mild dilatation of ascending aorta, measuring 44 mm, seen on 2D echo Outpatient follow-up with PCP and evaluation and management as deemed necessary.   Suspected catheter associated UTI: Continue IV ceftriaxone pending urine culture results (pending).  Sepsis ruled out.  Did not have sustained SIRS criteria except leukocytosis.  Day 3/3 IV ceftriaxone.  Discontinue ceftriaxone.  Urine culture without growth.  Hypokalemia: Replaced.  Repeat BMP pending.  Leukocytosis: Suspected leukemoid reaction/stress response.  Predominantly neutrophilic.  Ordered peripheral smear review by pathology.  Leukocytosis has almost resolved.  BPH/acute urinary retention: Although chronic Benadryl may have contributed, not the only precipitating factor given history of prolonged use for approximately a year.  However since  Benadryl not helping his upper respiratory congestion for which she was taking it, recommended stopping indefinitely.  Follows with Dr. Tresa Moore, [facility] and has  an appointment for next Friday to reassess voiding trial.  Continue Flomax and Proscar.  Gait ataxia and falls: Spouse reports 3 falls last year and a fall PTA.  Was getting outpatient PT evaluation twice a week.  PT consulted and recommended SNF.  CT head and C-spine without acute findings.  Ambulated 75 feet with PT today.  Left shoulder restricted mobility: Noted after fall in May 2023.  Seen by Dr. Jean Rosenthal, orthopedics and referred to her next orthopedic surgeon,?  Shoulder replacement recommended but patient/family do not want to pursue surgery given his advanced age.  Essential hypertension: Hold HCTZ and lisinopril due to AKI.  As needed IV hydralazine.  Start lowe dose amlodipine 5 Mg daily.  Incidental finding on CT C/A/P: 2.6 cm left thyroid nodule: As per recommendations, nonemergent thyroid ultrasound, this can be pursued as an outpatient via PCPs office.  Body mass index is 29.59 kg/m./Overweight    DVT prophylaxis: heparin injection 5,000 Units Start: 02/22/23 0800 SCDs Start: 02/22/23 0752  Subcutaneous heparin   Code Status: Full Code:  ACP Documents: None present. Family Communication: Spouse at bedside Disposition:  3/8; Discussed multiple times with [facility] RN who then discussed with the physician advisor on-call.  Given ongoing diarrhea, evaluation and management for same, care expected to cross 2 midnights, PA recommended changing to inpatient.  DC pending improvement of diarrhea.  Not medically ready today.     Consultants:     Procedures:     Antimicrobials:   As noted above.   Subjective:  3-4 BMs overnight, volume and consistency not known.  Had a BM this morning.  When I walked into the room, patient was sitting on the bedside commode, witnessed stools personally-soft brown stools without blood or mucus.  No abdominal pain but abdomen does not feel right.  No nausea or vomiting.  Not eating as much today.  Objective:   Vitals:    02/24/23 0447 02/24/23 0450 02/24/23 0500 02/24/23 0830  BP: (!) 172/99   (!) 169/75  Pulse:  70  78  Resp:    18  Temp:  98.6 F (37 C)  97.7 F (36.5 C)  TempSrc:  Axillary  Oral  SpO2:  94%    Weight:   90.9 kg   Height:        General exam: Pleasant elderly male, moderately built and overweight sitting up comfortably on bedside commode.  Oral mucosa moist.  Hard of hearing and has hearing aid in right ear.  Bilateral intraocular lens, blind in right eye and diminished visual acuity in the left eye. Respiratory system: Clear to auscultation.  No increased work of breathing. Cardiovascular system: S1 & S2 heard, RRR. No JVD, murmurs, rubs, gallops or clicks. No pedal edema.  Gastrointestinal system: Abdomen is nondistended, soft and nontender.  Normal bowel sounds heard. GU: Has indwelling Foley catheter present. Central nervous system: Alert and oriented. No focal neurological deficits. Extremities: Symmetric 5 x 5 power.  Tiny bruise over left elbow.  Also has a chronic or old appearing bruise over left anterior shoulder. Skin: No rashes, lesions or ulcers Psychiatry: Judgement and insight appear normal. Mood & affect appropriate.     Data Reviewed:   I have personally reviewed following labs and imaging studies   CBC: Recent Labs  Lab 02/22/23 0105 02/22/23 0903 02/23/23 [id number]  WBC 30.2* 21.3* 11.9*  NEUTROABS 27.0* 18.5*  --   HGB 13.2 11.3* 10.7*  HCT 37.9* 33.8* 30.7*  MCV 95.9 97.1 93.9  PLT 352 313 [id number]    Basic Metabolic Panel: Recent Labs  Lab 02/22/23 0105 02/22/23 0903 02/23/23 0558  NA 131* 132* 131*  K 3.4* 4.3 3.8  CL 93* 97* 98  CO2 '22 25 24  '$ GLUCOSE 137* 124* 116*  BUN 25* 22 8  CREATININE 1.89* 1.27* 0.72  CALCIUM 9.2 8.5* 8.8*  MG 1.8  --   --     Liver Function Tests: Recent Labs  Lab 02/22/23 0105  AST 27  ALT 7  ALKPHOS 54  BILITOT 0.9  PROT 6.8  ALBUMIN 3.5    CBG: No results for input(s): "GLUCAP" in the last 168  hours.  Microbiology Studies:   Recent Results (from the past 240 hour(s))  Resp panel by RT-PCR (RSV, Flu A&B, Covid)     Status: None   Collection Time: 02/22/23  2:30 AM   Specimen: Nasal Swab  Result Value Ref Range Status   SARS Coronavirus 2 by RT PCR NEGATIVE NEGATIVE Final   Influenza A by PCR NEGATIVE NEGATIVE Final   Influenza B by PCR NEGATIVE NEGATIVE Final    Comment: (NOTE) The Xpert Xpress SARS-CoV-2/FLU/RSV plus assay is intended as an aid in the diagnosis of influenza from Nasopharyngeal swab specimens and should not be used as a sole basis for treatment. Nasal washings and aspirates are unacceptable for Xpert Xpress SARS-CoV-2/FLU/RSV testing.  Fact Sheet for Patients: EntrepreneurPulse.com.au  Fact Sheet for Healthcare Providers: IncredibleEmployment.be  This test is not yet approved or cleared by the [place] FDA and has been authorized for detection and/or diagnosis of SARS-CoV-2 by FDA under an Emergency Use Authorization (EUA). This EUA will remain in effect (meaning this test can be used) for the duration of the COVID-19 declaration under Section 564(b)(1) of the Act, 21 U.S.C. section 360bbb-3(b)(1), unless the authorization is terminated or revoked.     Resp Syncytial Virus by PCR NEGATIVE NEGATIVE Final    Comment: (NOTE) Fact Sheet for Patients: EntrepreneurPulse.com.au  Fact Sheet for Healthcare Providers: IncredibleEmployment.be  This test is not yet approved or cleared by the [place] FDA and has been authorized for detection and/or diagnosis of SARS-CoV-2 by FDA under an Emergency Use Authorization (EUA). This EUA will remain in effect (meaning this test can be used) for the duration of the COVID-19 declaration under Section 564(b)(1) of the Act, 21 U.S.C. section 360bbb-3(b)(1), unless the authorization is terminated or revoked.  Performed at [facility], [place] [address], [place], [place] [address]   Blood Culture (routine x 2)     Status: None (Preliminary result)   Collection Time: 02/22/23  2:30 AM   Specimen: BLOOD RIGHT HAND  Result Value Ref Range Status   Specimen Description BLOOD RIGHT HAND  Final   Special Requests   Final    BOTTLES DRAWN AEROBIC AND ANAEROBIC Blood Culture adequate volume   Culture   Final    NO GROWTH 2 DAYS Performed at [facility], [place] [address], [place], [place] [address]    Report Status PENDING  Incomplete  Blood Culture (routine x 2)     Status: None (Preliminary result)   Collection Time: 02/22/23  2:35 AM   Specimen: BLOOD LEFT HAND  Result Value Ref Range Status   Specimen Description BLOOD LEFT HAND  Final   Special Requests  Final    BOTTLES DRAWN AEROBIC AND ANAEROBIC Blood Culture results may not be optimal due to an inadequate volume of blood received in culture bottles   Culture   Final    NO GROWTH 2 DAYS Performed at [facility], [place] [address] [place], [place] [address]    Report Status PENDING  Incomplete  Urine Culture     Status: None   Collection Time: 02/22/23  3:30 AM   Specimen: Urine, Catheterized  Result Value Ref Range Status   Specimen Description URINE, CATHETERIZED  Final   Special Requests NONE Reflexed from [id number]  Final   Culture   Final    NO GROWTH Performed at [facility], [place] [address], [place], [place] [address]    Report Status 02/23/2023 FINAL  Final  Gastrointestinal Panel by PCR , Stool     Status: None   Collection Time: 02/24/23  4:00 AM   Specimen: Stool  Result Value Ref Range Status   Campylobacter species NOT DETECTED NOT DETECTED Final   Plesimonas shigelloides NOT DETECTED NOT DETECTED Final   Salmonella species NOT DETECTED NOT DETECTED Final   Yersinia enterocolitica NOT DETECTED NOT DETECTED Final   Vibrio species NOT DETECTED NOT DETECTED Final   Vibrio cholerae NOT DETECTED NOT DETECTED Final    Enteroaggregative E coli (EAEC) NOT DETECTED NOT DETECTED Final   Enteropathogenic E coli (EPEC) NOT DETECTED NOT DETECTED Final   Enterotoxigenic E coli (ETEC) NOT DETECTED NOT DETECTED Final   Shiga like toxin producing E coli (STEC) NOT DETECTED NOT DETECTED Final   Shigella/Enteroinvasive E coli (EIEC) NOT DETECTED NOT DETECTED Final   Cryptosporidium NOT DETECTED NOT DETECTED Final   Cyclospora cayetanensis NOT DETECTED NOT DETECTED Final   Entamoeba histolytica NOT DETECTED NOT DETECTED Final   Giardia lamblia NOT DETECTED NOT DETECTED Final   Adenovirus F40/41 NOT DETECTED NOT DETECTED Final   Astrovirus NOT DETECTED NOT DETECTED Final   Norovirus GI/GII NOT DETECTED NOT DETECTED Final   Rotavirus A NOT DETECTED NOT DETECTED Final   Sapovirus (I, II, IV, and V) NOT DETECTED NOT DETECTED Final    Comment: Performed at [facility], [address] [place], [place] [address]    Radiology Studies:  ECHOCARDIOGRAM COMPLETE  Result Date: 02/23/2023    ECHOCARDIOGRAM REPORT   Patient Name:   Gurdeep D Churchill Date of Exam: 02/23/2023 Medical Rec #:  [id number]     Height:       69.0 in Accession #:    [id number]    Weight:       200.0 lb Date of Birth:  [birth year]     BSA:          2.066 m Patient Age:    63 years      BP:           134/83 mmHg Patient Gender: M             HR:           63 bpm. Exam Location:  Inpatient Procedure: 2D Echo, Cardiac Doppler and Color Doppler Indications:    syncope  History:        Patient has prior history of Echocardiogram examinations.                 Stroke; Risk Factors:Dyslipidemia and Hypertension.  Sonographer:    Phineas Douglas Referring Phys: [place]  1. Left ventricular ejection fraction, by estimation, is  60 to 65%. The left ventricle has normal function. The left ventricle has no regional Steege motion abnormalities. There is moderate left ventricular hypertrophy. Left ventricular diastolic parameters are indeterminate.  2.  Right ventricular systolic function is normal. The right ventricular size is normal. Tricuspid regurgitation signal is inadequate for assessing PA pressure.  3. Left atrial size was moderately dilated.  4. Right atrial size was mildly dilated.  5. The mitral valve is normal in structure. No evidence of mitral valve regurgitation.  6. The aortic valve is tricuspid. There is mild calcification of the aortic valve. There is mild thickening of the aortic valve. Aortic valve regurgitation is mild to moderate. Aortic valve sclerosis is present, with no evidence of aortic valve stenosis.  7. There is mild dilatation of the ascending aorta, measuring 44 mm.  8. The inferior vena cava is dilated in size with >50% respiratory variability, suggesting right atrial pressure of 8 mmHg. FINDINGS  Left Ventricle: Left ventricular ejection fraction, by estimation, is 60 to 65%. The left ventricle has normal function. The left ventricle has no regional Magley motion abnormalities. The left ventricular internal cavity size was normal in size. There is  moderate left ventricular hypertrophy. Left ventricular diastolic parameters are indeterminate. Normal left ventricular filling pressure. Right Ventricle: The right ventricular size is normal. No increase in right ventricular Peloquin thickness. Right ventricular systolic function is normal. Tricuspid regurgitation signal is inadequate for assessing PA pressure. Left Atrium: Left atrial size was moderately dilated. Right Atrium: Right atrial size was mildly dilated. Pericardium: There is no evidence of pericardial effusion. Mitral Valve: The mitral valve is normal in structure. No evidence of mitral valve regurgitation. Tricuspid Valve: The tricuspid valve is normal in structure. Tricuspid valve regurgitation is trivial. Aortic Valve: The aortic valve is tricuspid. There is mild calcification of the aortic valve. There is mild thickening of the aortic valve. Aortic valve regurgitation is mild  to moderate. Aortic valve sclerosis is present, with no evidence of aortic valve stenosis. Pulmonic Valve: The pulmonic valve was grossly normal. Pulmonic valve regurgitation is mild. Aorta: The aortic root is normal in size and structure. There is mild dilatation of the ascending aorta, measuring 44 mm. Venous: The inferior vena cava is dilated in size with greater than 50% respiratory variability, suggesting right atrial pressure of 8 mmHg. IAS/Shunts: No atrial level shunt detected by color flow Doppler.  LEFT VENTRICLE PLAX 2D LVIDd:         4.40 cm      Diastology LVIDs:         2.70 cm      LV e' medial:    7.40 cm/s LV PW:         1.40 cm      LV E/e' medial:  11.0 LV IVS:        1.40 cm      LV e' lateral:   9.25 cm/s LVOT diam:     2.20 cm      LV E/e' lateral: 8.8 LV SV:         84 LV SV Index:   41 LVOT Area:     3.80 cm  LV Volumes (MOD) LV vol d, MOD A2C: 120.0 ml LV vol d, MOD A4C: 130.0 ml LV vol s, MOD A2C: 39.4 ml LV vol s, MOD A4C: 43.1 ml LV SV MOD A2C:     80.6 ml LV SV MOD A4C:     130.0 ml LV SV MOD BP:  87.0 ml RIGHT VENTRICLE             IVC RV Basal diam:  4.00 cm     IVC diam: 2.40 cm RV S prime:     26.90 cm/s TAPSE (M-mode): 3.4 cm LEFT ATRIUM             Index        RIGHT ATRIUM           Index LA diam:        4.70 cm 2.27 cm/m   RA Area:     22.30 cm LA Vol (A2C):   72.7 ml 35.19 ml/m  RA Volume:   58.00 ml  28.07 ml/m LA Vol (A4C):   55.0 ml 26.62 ml/m LA Biplane Vol: 64.3 ml 31.12 ml/m  AORTIC VALVE LVOT Vmax:   139.00 cm/s LVOT Vmean:  73.500 cm/s LVOT VTI:    0.221 m  AORTA Ao Root diam: 3.90 cm Ao Asc diam:  4.40 cm MITRAL VALVE MV Area (PHT): 4.12 cm    SHUNTS MV Decel Time: 184 msec    Systemic VTI:  0.22 m MV E velocity: 81.50 cm/s  Systemic Diam: 2.20 cm MV A velocity: 78.20 cm/s MV E/A ratio:  1.04 Mihai Croitoru MD Electronically signed by Sanda Klein MD Signature Date/Time: 02/23/2023/2:25:04 PM    Final     Scheduled Meds:    atorvastatin  40 mg Oral QHS    Chlorhexidine Gluconate Cloth  6 each Topical Daily   finasteride  5 mg Oral QHS   heparin  5,000 Units Subcutaneous Q8H   tamsulosin  0.4 mg Oral QHS    Continuous Infusions:    cefTRIAXone (ROCEPHIN)  IV Stopped (02/23/23 1644)     LOS: 1 day     Vernell Leep, MD,  FACP, FHM, SFHM, [facility], [place]     To contact the attending provider between 7A-7P or the covering provider during after hours 7P-7A, please log into the web site [email or web address] and access using universal [place] password for that web site. If you do not have the password, please call the hospital operator.  02/24/2023, 10:31 AM

## 2023-02-25 LAB — CBC
HCT: 32.8 % — ABNORMAL LOW (ref 39.0–52.0)
Hemoglobin: 11.5 g/dL — ABNORMAL LOW (ref 13.0–17.0)
MCH: 32.7 pg (ref 26.0–34.0)
MCHC: 35.1 g/dL (ref 30.0–36.0)
MCV: 93.2 fL (ref 80.0–100.0)
Platelets: 343 10*3/uL (ref 150–400)
RBC: 3.52 MIL/uL — ABNORMAL LOW (ref 4.22–5.81)
RDW: 13.5 % (ref 11.5–15.5)
WBC: 11.7 10*3/uL — ABNORMAL HIGH (ref 4.0–10.5)
nRBC: 0 % (ref 0.0–0.2)

## 2023-02-25 LAB — BASIC METABOLIC PANEL
Anion gap: 10 (ref 5–15)
BUN: 6 mg/dL — ABNORMAL LOW (ref 8–23)
CO2: 24 mmol/L (ref 22–32)
Calcium: 9.2 mg/dL (ref 8.9–10.3)
Chloride: 99 mmol/L (ref 98–111)
Creatinine, Ser: 0.73 mg/dL (ref 0.61–1.24)
GFR, Estimated: >60 mL/min (ref >60)
Glucose, Bld: 124 mg/dL — ABNORMAL HIGH (ref 70–99)
Potassium: 3.9 mmol/L (ref 3.5–5.1)
Sodium: 133 mmol/L — ABNORMAL LOW (ref 135–145)

## 2023-02-25 MED ORDER — GUAIFENESIN-DM 100-10 MG/5ML PO SYRP
5.0000 mL | ORAL_SOLUTION | ORAL | Status: DC | PRN
  Administered 2023-02-25 – 2023-02-26 (×3): 5 mL via ORAL
  Filled 2023-02-25 (×3): qty 5

## 2023-02-25 NOTE — Progress Notes (Signed)
PROGRESS NOTE   Jay Tran  [id number]    DOB: 10-09-1934    DOA: 02/22/2023  PCP: Leonard Downing, MD   I have briefly reviewed patients previous medical records in [facility].  Chief Complaint  Patient presents with   Near Syncope    Brief Narrative:  89 year old married male, ambulates with the help of cane or a walker, medical history significant for BPH, s/p TURP, chronic back pain, hypertension, visual and hearing impaired, gait imbalance and falls, chronic Benadryl use, s/p recent Foley catheter placement for urinary retention, failed voiding trial at urologist office on day of admission, presented with fall at home, syncope, diarrhea x 1.  Admitted for diagnosis of syncope, acute kidney injury, suspected catheter associated urinary tract infection.  Course complicated by acute diarrhea.  Diarrhea has resolved as of 3/8 morning.  Patient is medically optimized for DC as of 3/9.  However PT continues to recommend SNF, spouse seems to be unable to care for him at home by herself, thereby possible unsafe discharge options and hence awaiting SNF.  TOC on board.   Assessment & Plan:  Principal Problem:   Acute kidney injury superimposed on chronic kidney disease ([place]) Active Problems:   UTI (urinary tract infection) due to urinary indwelling Foley catheter [facility])   Essential hypertension   Benign prostatic hyperplasia with lower urinary tract symptoms   GERD (gastroesophageal reflux disease)   Chronic back pain   Syncope and collapse   Acute cystitis without hematuria   Acute kidney injury complicating stage II chronic kidney disease: Last known creatinine 0.75 in September 2022, GFR >80.  Presented with creatinine of 1.89.  AKI suspected due to dehydration, HCTZ, lisinopril,?  NSAID use.  Hold HCTZ and lisinopril.  Renal ultrasound without hydronephrosis.  S/p 2 L IV fluid bolus in the ED followed by maintenance IV fluids.  Avoid nephrotoxics.  AKI resolved.   Continuing to hold lisinopril.  Syncope and collapse: Suspect multifactorial.  DD: Orthostasis,?  Bradycardia, polypharmacy including increased dose of Zanaflex, recently started Flomax and may be even vasovagal due to sudden urge for BM.  In ED, BP dropped from supine 129/59-117/51, no standing BPs recorded.  Telemetry shows sinus rhythm without arrhythmias.  TTE from February 2021 showed normal EF and no aortic valve stenosis.  2D echo: LVEF 60-65%, Mild to moderate aortic valve regurgitation.  No aortic valve stenosis. Patient does not drive.  Acute diarrhea: Started on the night of hospital admission.  Had 3-4 episodes of explosive diarrhea yesterday and multiple (3-4) episodes overnight.  Unclear etiology?  Acute viral versus food poisoning.  Supportive treatment.  GI panel please negative,.  Low index of suspicion for C. difficile.  KUB without acute findings.  Diarrhea appears to have resolved and patient has not had a BM since the 1 I witnessed yesterday morning at bedside.  Hyponatremia: May be multifactorial due to recent diarrhea, GI losses but more likely due to HCTZ.  Stable.  At discharge, DC HCTZ indefinitely.  Mild dilatation of ascending aorta, measuring 44 mm, seen on 2D echo Outpatient follow-up with PCP and evaluation and management as deemed necessary.   Suspected catheter associated UTI: Continue IV ceftriaxone pending urine culture results (pending).  Sepsis ruled out.  Did not have sustained SIRS criteria except leukocytosis.  Completed 3 days of IV ceftriaxone.  Urine culture without growth.  Hypokalemia: Replaced.  Leukocytosis: Suspected leukemoid reaction/stress response.  Predominantly neutrophilic.  Ordered peripheral smear review by pathology.  Leukocytosis  has almost resolved.  BPH/acute urinary retention: Although chronic Benadryl may have contributed, not the only precipitating factor given history of prolonged use for approximately a year.  However since  Benadryl not helping his upper respiratory congestion for which she was taking it, recommended stopping indefinitely.  Follows with Dr. Tresa Moore, [facility] and has an appointment for next Friday to reassess voiding trial.  Continue Flomax and Proscar.  Gait ataxia and falls: Spouse reports 3 falls last year and a fall PTA.  Was getting outpatient PT evaluation twice a week.  PT consulted and recommended SNF.  CT head and C-spine without acute findings.  With PT, ambulated 150 feet today with overall minimum assist.  They continue to recommend SNF.  Left shoulder restricted mobility: Noted after fall in May 2023.  Seen by Dr. Jean Rosenthal, orthopedics and referred to her next orthopedic surgeon,?  Shoulder replacement recommended but patient/family do not want to pursue surgery given his advanced age.  Essential hypertension: Hold HCTZ and lisinopril due to AKI.  As needed IV hydralazine.  Continue amlodipine 5 Mg daily.  May have to titrate up to 10 Mg if not controlled.  Incidental finding on CT C/A/P: 2.6 cm left thyroid nodule: As per recommendations, nonemergent thyroid ultrasound, this can be pursued as an outpatient via PCPs office.  Body mass index is 29.59 kg/m./Overweight    DVT prophylaxis: heparin injection 5,000 Units Start: 02/22/23 0800 SCDs Start: 02/22/23 0752  Subcutaneous heparin   Code Status: Full Code:  ACP Documents: None present. Family Communication: None at bed side this morning. Disposition:  Patient is optimized from a medical standpoint for DC to next level of care.  Discussed in detail with TOC and subsequently with Dr. Ree Kida, Physician Advisor Director.  She recommends that since patient has unsafe discharge option to home given his elderly state, therapy is recommending SNF and family being unable to care for him at home, await for DC to SNF.     Consultants:     Procedures:     Antimicrobials:   As noted above.   Subjective:   Denies complaints.  No BM since yesterday morning.  As per RN, no acute issues.  Objective:   Vitals:   02/24/23 2343 02/25/23 0000 02/25/23 0400 02/25/23 0802  BP: (!) 154/79 137/70 (!) 156/74 (!) 187/77  Pulse: 84 87 73 75  Resp: '18  18 20  '$ Temp: 98.4 F (36.9 C)  98.2 F (36.8 C) 98.5 F (36.9 C)  TempSrc: Oral  Oral Oral  SpO2: 95% 92% 95% 94%  Weight:      Height:        General exam: Pleasant elderly male, moderately built and overweight sitting up comfortably in reclining chair.  Oral mucosa moist.  Hard of hearing and has hearing aid in right ear.  Bilateral intraocular lens, blind in right eye and diminished visual acuity in the left eye. Respiratory system: Clear to auscultation.  No increased work of breathing. Cardiovascular system: S1 & S2 heard, RRR. No JVD, murmurs, rubs, gallops or clicks. No pedal edema.  Gastrointestinal system: Abdomen is slightly protuberant but soft, nontender.  Normal bowel sounds heard. GU: Has indwelling Foley catheter present. Central nervous system: Alert and oriented. No focal neurological deficits. Extremities: Symmetric 5 x 5 power.  Tiny bruise over left elbow.  Also has a chronic or old appearing bruise over left anterior shoulder. Skin: No rashes, lesions or ulcers Psychiatry: Judgement and insight may be somewhat impaired since admission  but suspect this is his baseline. Mood & affect appropriate.     Data Reviewed:   I have personally reviewed following labs and imaging studies   CBC: Recent Labs  Lab 02/22/23 0105 02/22/23 0903 02/23/23 0558 02/24/23 1032 02/25/23 0333  WBC 30.2* 21.3* 11.9* 10.2 11.7*  NEUTROABS 27.0* 18.5*  --   --   --   HGB 13.2 11.3* 10.7* 12.2* 11.5*  HCT 37.9* 33.8* 30.7* 35.4* 32.8*  MCV 95.9 97.1 93.9 94.1 93.2  PLT 352 313 270 366 [id number]    Basic Metabolic Panel: Recent Labs  Lab 02/22/23 0105 02/22/23 0903 02/23/23 0558 02/24/23 1032 02/25/23 0333  NA 131* 132* 131* 132* 133*  K  3.4* 4.3 3.8 3.5 3.9  CL 93* 97* 98 97* 99  CO2 '22 25 24 '$ 21* 24  GLUCOSE 137* 124* 116* 171* 124*  BUN 25* '22 8 8 '$ 6*  CREATININE 1.89* 1.27* 0.72 0.75 0.73  CALCIUM 9.2 8.5* 8.8* 9.5 9.2  MG 1.8  --   --   --   --     Liver Function Tests: Recent Labs  Lab 02/22/23 0105  AST 27  ALT 7  ALKPHOS 54  BILITOT 0.9  PROT 6.8  ALBUMIN 3.5    CBG: No results for input(s): "GLUCAP" in the last 168 hours.  Microbiology Studies:   Recent Results (from the past 240 hour(s))  Resp panel by RT-PCR (RSV, Flu A&B, Covid)     Status: None   Collection Time: 02/22/23  2:30 AM   Specimen: Nasal Swab  Result Value Ref Range Status   SARS Coronavirus 2 by RT PCR NEGATIVE NEGATIVE Final   Influenza A by PCR NEGATIVE NEGATIVE Final   Influenza B by PCR NEGATIVE NEGATIVE Final    Comment: (NOTE) The Xpert Xpress SARS-CoV-2/FLU/RSV plus assay is intended as an aid in the diagnosis of influenza from Nasopharyngeal swab specimens and should not be used as a sole basis for treatment. Nasal washings and aspirates are unacceptable for Xpert Xpress SARS-CoV-2/FLU/RSV testing.  Fact Sheet for Patients: EntrepreneurPulse.com.au  Fact Sheet for Healthcare Providers: IncredibleEmployment.be  This test is not yet approved or cleared by the [place] FDA and has been authorized for detection and/or diagnosis of SARS-CoV-2 by FDA under an Emergency Use Authorization (EUA). This EUA will remain in effect (meaning this test can be used) for the duration of the COVID-19 declaration under Section 564(b)(1) of the Act, 21 U.S.C. section 360bbb-3(b)(1), unless the authorization is terminated or revoked.     Resp Syncytial Virus by PCR NEGATIVE NEGATIVE Final    Comment: (NOTE) Fact Sheet for Patients: EntrepreneurPulse.com.au  Fact Sheet for Healthcare Providers: IncredibleEmployment.be  This test is not yet approved or  cleared by the [place] FDA and has been authorized for detection and/or diagnosis of SARS-CoV-2 by FDA under an Emergency Use Authorization (EUA). This EUA will remain in effect (meaning this test can be used) for the duration of the COVID-19 declaration under Section 564(b)(1) of the Act, 21 U.S.C. section 360bbb-3(b)(1), unless the authorization is terminated or revoked.  Performed at [facility], [place] [address], [place], [place] [address]   Blood Culture (routine x 2)     Status: None (Preliminary result)   Collection Time: 02/22/23  2:30 AM   Specimen: BLOOD RIGHT HAND  Result Value Ref Range Status   Specimen Description BLOOD RIGHT HAND  Final   Special Requests   Final    BOTTLES DRAWN AEROBIC  AND ANAEROBIC Blood Culture adequate volume   Culture   Final    NO GROWTH 3 DAYS Performed at [facility], [place] [address] [place], [place] [address]    Report Status PENDING  Incomplete  Blood Culture (routine x 2)     Status: None (Preliminary result)   Collection Time: 02/22/23  2:35 AM   Specimen: BLOOD LEFT HAND  Result Value Ref Range Status   Specimen Description BLOOD LEFT HAND  Final   Special Requests   Final    BOTTLES DRAWN AEROBIC AND ANAEROBIC Blood Culture results may not be optimal due to an inadequate volume of blood received in culture bottles   Culture   Final    NO GROWTH 3 DAYS Performed at [facility], [place] [address], [place], [place] [address]    Report Status PENDING  Incomplete  Urine Culture     Status: None   Collection Time: 02/22/23  3:30 AM   Specimen: Urine, Catheterized  Result Value Ref Range Status   Specimen Description URINE, CATHETERIZED  Final   Special Requests NONE Reflexed from [id number]  Final   Culture   Final    NO GROWTH Performed at [facility], [place] [address], [place], [place] [address]    Report Status 02/23/2023 FINAL  Final  Gastrointestinal Panel by PCR , Stool     Status: None   Collection  Time: 02/24/23  4:00 AM   Specimen: Stool  Result Value Ref Range Status   Campylobacter species NOT DETECTED NOT DETECTED Final   Plesimonas shigelloides NOT DETECTED NOT DETECTED Final   Salmonella species NOT DETECTED NOT DETECTED Final   Yersinia enterocolitica NOT DETECTED NOT DETECTED Final   Vibrio species NOT DETECTED NOT DETECTED Final   Vibrio cholerae NOT DETECTED NOT DETECTED Final   Enteroaggregative E coli (EAEC) NOT DETECTED NOT DETECTED Final   Enteropathogenic E coli (EPEC) NOT DETECTED NOT DETECTED Final   Enterotoxigenic E coli (ETEC) NOT DETECTED NOT DETECTED Final   Shiga like toxin producing E coli (STEC) NOT DETECTED NOT DETECTED Final   Shigella/Enteroinvasive E coli (EIEC) NOT DETECTED NOT DETECTED Final   Cryptosporidium NOT DETECTED NOT DETECTED Final   Cyclospora cayetanensis NOT DETECTED NOT DETECTED Final   Entamoeba histolytica NOT DETECTED NOT DETECTED Final   Giardia lamblia NOT DETECTED NOT DETECTED Final   Adenovirus F40/41 NOT DETECTED NOT DETECTED Final   Astrovirus NOT DETECTED NOT DETECTED Final   Norovirus GI/GII NOT DETECTED NOT DETECTED Final   Rotavirus A NOT DETECTED NOT DETECTED Final   Sapovirus (I, II, IV, and V) NOT DETECTED NOT DETECTED Final    Comment: Performed at [facility], [address], [place], [place] [address]    Radiology Studies:  DG Abd Portable 1V  Result Date: 02/24/2023 CLINICAL DATA:  Diarrhea EXAM: PORTABLE ABDOMEN - 1 VIEW COMPARISON:  CT abdomen 02/22/2023 FINDINGS: Dorsal column stimulator device noted with leads projecting at T8 and T9. Posterolateral rod and pedicle screw fixators at L1-L2-L3 with interbody fusion markers at these levels and also at L4-5 and L5-S1 Moderate degenerative hip arthropathy bilaterally. A left abdominal loop of small bowel is mildly dilated at 3.4 cm diameter. No other dilated bowel observed. Rounded metal density projects over the right lower quadrant of the abdomen, this was  shown to be near the appendiceal tip on recent CT. Cholelithiasis noted. IMPRESSION: 1. Mildly dilated short segment of small bowel in the left mid abdomen  measuring 3.4 cm diameter. Significance uncertain. No other dilated bowel observed. 2. Cholelithiasis. 3. Postoperative findings in the lumbar spine. 4. Dorsal column stimulator device noted with leads projecting at T8 and T9. Electronically Signed   By: Van Clines M.D.   On: 02/24/2023 12:20   ECHOCARDIOGRAM COMPLETE  Result Date: 02/23/2023    ECHOCARDIOGRAM REPORT   Patient Name:   Khylin D Ernster Date of Exam: 02/23/2023 Medical Rec #:  [id number]     Height:       69.0 in Accession #:    [id number]    Weight:       200.0 lb Date of Birth:  01-24-1934     BSA:          2.066 m Patient Age:    56 years      BP:           134/83 mmHg Patient Gender: M             HR:           63 bpm. Exam Location:  Inpatient Procedure: 2D Echo, Cardiac Doppler and Color Doppler Indications:    syncope  History:        Patient has prior history of Echocardiogram examinations.                 Stroke; Risk Factors:Dyslipidemia and Hypertension.  Sonographer:    Phineas Douglas Referring Phys: [place]  1. Left ventricular ejection fraction, by estimation, is 60 to 65%. The left ventricle has normal function. The left ventricle has no regional Sarratt motion abnormalities. There is moderate left ventricular hypertrophy. Left ventricular diastolic parameters are indeterminate.  2. Right ventricular systolic function is normal. The right ventricular size is normal. Tricuspid regurgitation signal is inadequate for assessing PA pressure.  3. Left atrial size was moderately dilated.  4. Right atrial size was mildly dilated.  5. The mitral valve is normal in structure. No evidence of mitral valve regurgitation.  6. The aortic valve is tricuspid. There is mild calcification of the aortic valve. There is mild thickening of the aortic valve. Aortic valve  regurgitation is mild to moderate. Aortic valve sclerosis is present, with no evidence of aortic valve stenosis.  7. There is mild dilatation of the ascending aorta, measuring 44 mm.  8. The inferior vena cava is dilated in size with >50% respiratory variability, suggesting right atrial pressure of 8 mmHg. FINDINGS  Left Ventricle: Left ventricular ejection fraction, by estimation, is 60 to 65%. The left ventricle has normal function. The left ventricle has no regional Linse motion abnormalities. The left ventricular internal cavity size was normal in size. There is  moderate left ventricular hypertrophy. Left ventricular diastolic parameters are indeterminate. Normal left ventricular filling pressure. Right Ventricle: The right ventricular size is normal. No increase in right ventricular Ruby thickness. Right ventricular systolic function is normal. Tricuspid regurgitation signal is inadequate for assessing PA pressure. Left Atrium: Left atrial size was moderately dilated. Right Atrium: Right atrial size was mildly dilated. Pericardium: There is no evidence of pericardial effusion. Mitral Valve: The mitral valve is normal in structure. No evidence of mitral valve regurgitation. Tricuspid Valve: The tricuspid valve is normal in structure. Tricuspid valve regurgitation is trivial. Aortic Valve: The aortic valve is tricuspid. There is mild calcification of the aortic valve. There is mild thickening of the aortic valve. Aortic valve regurgitation is mild to moderate. Aortic valve sclerosis is present, with no evidence of  aortic valve stenosis. Pulmonic Valve: The pulmonic valve was grossly normal. Pulmonic valve regurgitation is mild. Aorta: The aortic root is normal in size and structure. There is mild dilatation of the ascending aorta, measuring 44 mm. Venous: The inferior vena cava is dilated in size with greater than 50% respiratory variability, suggesting right atrial pressure of 8 mmHg. IAS/Shunts: No atrial level  shunt detected by color flow Doppler.  LEFT VENTRICLE PLAX 2D LVIDd:         4.40 cm      Diastology LVIDs:         2.70 cm      LV e' medial:    7.40 cm/s LV PW:         1.40 cm      LV E/e' medial:  11.0 LV IVS:        1.40 cm      LV e' lateral:   9.25 cm/s LVOT diam:     2.20 cm      LV E/e' lateral: 8.8 LV SV:         84 LV SV Index:   41 LVOT Area:     3.80 cm  LV Volumes (MOD) LV vol d, MOD A2C: 120.0 ml LV vol d, MOD A4C: 130.0 ml LV vol s, MOD A2C: 39.4 ml LV vol s, MOD A4C: 43.1 ml LV SV MOD A2C:     80.6 ml LV SV MOD A4C:     130.0 ml LV SV MOD BP:      87.0 ml RIGHT VENTRICLE             IVC RV Basal diam:  4.00 cm     IVC diam: 2.40 cm RV S prime:     26.90 cm/s TAPSE (M-mode): 3.4 cm LEFT ATRIUM             Index        RIGHT ATRIUM           Index LA diam:        4.70 cm 2.27 cm/m   RA Area:     22.30 cm LA Vol (A2C):   72.7 ml 35.19 ml/m  RA Volume:   58.00 ml  28.07 ml/m LA Vol (A4C):   55.0 ml 26.62 ml/m LA Biplane Vol: 64.3 ml 31.12 ml/m  AORTIC VALVE LVOT Vmax:   139.00 cm/s LVOT Vmean:  73.500 cm/s LVOT VTI:    0.221 m  AORTA Ao Root diam: 3.90 cm Ao Asc diam:  4.40 cm MITRAL VALVE MV Area (PHT): 4.12 cm    SHUNTS MV Decel Time: 184 msec    Systemic VTI:  0.22 m MV E velocity: 81.50 cm/s  Systemic Diam: 2.20 cm MV A velocity: 78.20 cm/s MV E/A ratio:  1.04 Mihai Croitoru MD Electronically signed by Sanda Klein MD Signature Date/Time: 02/23/2023/2:25:04 PM    Final     Scheduled Meds:    amLODipine  5 mg Oral Daily   atorvastatin  40 mg Oral QHS   Chlorhexidine Gluconate Cloth  6 each Topical Daily   finasteride  5 mg Oral QHS   heparin  5,000 Units Subcutaneous Q8H   lidocaine  1 patch Transdermal Q24H   tamsulosin  0.4 mg Oral QHS    Continuous Infusions:    lactated ringers 50 mL/hr at 02/25/23 0529     LOS: 2 days     Vernell Leep, MD,  FACP, FHM, [facility], [facility], [facility]   [facility] [place]     To contact the attending  provider between 7A-7P or the covering provider during after hours 7P-7A, please log into the web site [email or web address] and access using universal [place] password for that web site. If you do not have the password, please call the hospital operator.  02/25/2023, 11:07 AM

## 2023-02-25 NOTE — TOC Progression Note (Signed)
Transition of Care [facility]) - Progression Note    Patient Details  Name: [name] MRN: [id number] Date of Birth: 18-Jan-1934  Transition of [facility]) CM/SW [place], LCSW Phone Number: 02/25/2023, 10:46 AM  Clinical Narrative:    10am-CSW spoke with patient's wife regarding discharge plan and likely discharge today, answering questions about appeal process.  11am-CSW updated spouse that patient will plan to be discharged tomorrow. She stated that patient is not having a good day and is still having bathroom issues. CSW updated Olivia Mackie at [facility] and she will be able to accept patient tomorrow. CSW made spouse aware that Medicare will still review hospital stay for medical necessity to determine if they pay for SNF even though patient has had three nights inpatient. She reported understanding. She requested [facility] for transport. CSW discussed ambulance billing as well.    Expected Discharge Plan: [place] Barriers to Discharge: Continued Medical Work up, Inadequate or no insurance, SNF Pending bed offer  Expected Discharge Plan and Services In-house Referral: Clinical Social Work   Post Acute Care Choice: [place] Living arrangements for the past 2 months: Single Family Home                                       Social Determinants of Health (SDOH) Interventions SDOH Screenings   Food Insecurity: No Food Insecurity (02/22/2023)  Housing: Low Risk  (02/22/2023)  Transportation Needs: No Transportation Needs (02/22/2023)  Utilities: Not At Risk (02/22/2023)  Tobacco Use: Medium Risk (02/22/2023)    Readmission Risk Interventions     No data to display

## 2023-02-25 NOTE — TOC Progression Note (Signed)
Transition of Care [facility]) - Progression Note    Patient Details  Name: Jay Tran MRN: [id number] Date of Birth: [birth year]  Transition [facility]) CM/SW Contact  Ina Homes, [place] Phone Number: 02/25/2023, 9:53 AM  Clinical Narrative:     SW spoke with Olivia Mackie ([facility] [phone]) confirmed can accept pt tomorrow. Will f/u with room #  Expected Discharge Plan: [place] Barriers to Discharge: Continued Medical Work up, Inadequate or no insurance, SNF Pending bed offer  Expected Discharge Plan and Services In-house Referral: Clinical Social Work   Post Acute Care Choice: [place] Living arrangements for the past 2 months: Single Family Home                                       Social Determinants of Health (SDOH) Interventions [place]: No Food Insecurity (02/22/2023)  Housing: Low Risk  (02/22/2023)  Transportation Needs: No Transportation Needs (02/22/2023)  Utilities: Not At Risk (02/22/2023)  Tobacco Use: Medium Risk (02/22/2023)    Readmission Risk Interventions     No data to display

## 2023-02-25 NOTE — Progress Notes (Signed)
Physical Therapy Treatment Patient Details Name: Jay Tran MRN: [id number] DOB: 1934/05/21 Today's Date: 02/25/2023   History of Present Illness 89 y.o. male presents to [facility] hospital on 02/22/2023 after fall at home with associated syncope and diarrhea. Pt also hound to have AKI and suspected UTI. PMH includes BPH, chronic back pain, HTN, gait instability, GERD.    PT Comments    Pt continuing to progress with therapy and tolerated today's session well. Pt continuing to require minA for bed mobility and transfers, ambulating with RW with minG and cueing throughout for proximity to RW and forward gaze, mild imbalance and path deviation noted which improves with forward gaze. Pt participated in standing marches for BLE strengthening but with 1 LOB requiring minA to correct. Per discussion with pt and his family, continue to recommend SNF at discharge as wife reports she is unable to care for pt until he is more independent. Pt is close to home level but would need a few more PT sessions. Acute PT will continue to follow to progress mobility.     Recommendations for follow up therapy are one component of a multi-disciplinary discharge planning process, led by the attending physician.  Recommendations may be updated based on patient status, additional functional criteria and insurance authorization.  Follow Up Recommendations  Skilled nursing-short term rehab (<3 hours/day) Can patient physically be transported by private vehicle: Yes   Assistance Recommended at Discharge Frequent or constant Supervision/Assistance  Patient can return home with the following A little help with walking and/or transfers;Assistance with cooking/housework;Assist for transportation;Help with stairs or ramp for entrance;A little help with bathing/dressing/bathroom   Equipment Recommendations  None recommended by PT    Recommendations for Other Services       Precautions / Restrictions Precautions Precautions:  Fall Restrictions Weight Bearing Restrictions: No     Mobility  Bed Mobility Overal bed mobility: Needs Assistance Bed Mobility: Rolling, Sidelying to Sit Rolling: Min guard Sidelying to sit: Min assist, HOB elevated       General bed mobility comments: use of hand rails, rolling with minG for sidelying to sitting with minA for trunk management and balance    Transfers Overall transfer level: Needs assistance Equipment used: Rolling walker (2 wheels) Transfers: Sit to/from Stand Sit to Stand: Min assist           General transfer comment: minA to stand for power up and steady from bed and chair. Cued for proper hand placement with good carryover with repetition. ~2 trials pt able to complete with minG but minA needed overall    Ambulation/Gait Ambulation/Gait assistance: Min guard Gait Distance (Feet): 150 Feet Assistive device: Rolling walker (2 wheels) Gait Pattern/deviations: Step-through pattern, Decreased stride length, Trunk flexed, Drifts right/left Gait velocity: decreased     General Gait Details: intermittently varying gait speed, cued for forward gaze and upright posture with proximity to RW, pt correcting but reverting back to trunk flexion and downward gaze with fatigue.   Stairs             Wheelchair Mobility    Modified Rankin (Stroke Patients Only)       Balance Overall balance assessment: Needs assistance Sitting-balance support: No upper extremity supported, Feet supported Sitting balance-Leahy Scale: Fair Sitting balance - Comments: stable sitting   Standing balance support: Bilateral upper extremity supported, During functional activity, Reliant on assistive device for balance Standing balance-Leahy Scale: Poor Standing balance comment: reliant on RW for support with standing and ambulation  Cognition Arousal/Alertness: Awake/alert Behavior During Therapy: WFL for tasks  assessed/performed Overall Cognitive Status: Within Functional Limits for tasks assessed                                 General Comments: pleasant throughout session, mild HOH with hearing aids in, intermittently repeating himself        Exercises General Exercises - Lower Extremity Hip Flexion/Marching: AROM, Both, 10 reps, Standing Other Exercises Other Exercises: sit<>stand x5 reps for BLE strengthening    General Comments General comments (skin integrity, edema, etc.): VSS on room air      Pertinent Vitals/Pain Pain Assessment Pain Assessment: Faces Faces Pain Scale: Hurts a little bit Pain Location: generalized Pain Descriptors / Indicators: Aching Pain Intervention(s): Monitored during session, Limited activity within patient's tolerance    Home Living                          Prior Function            PT Goals (current goals can now be found in the care plan section) Acute Rehab PT Goals Patient Stated Goal: to improve strength, return to ambulation and reduce falls risk PT Goal Formulation: With patient Time For Goal Achievement: 03/08/23 Potential to Achieve Goals: Fair Progress towards PT goals: Progressing toward goals    Frequency    Min 3X/week      PT Plan Current plan remains appropriate    Co-evaluation              AM-PAC PT "6 Clicks" Mobility   Outcome Measure  Help needed turning from your back to your side while in a flat bed without using bedrails?: A Little Help needed moving from lying on your back to sitting on the side of a flat bed without using bedrails?: A Little Help needed moving to and from a bed to a chair (including a wheelchair)?: A Little Help needed standing up from a chair using your arms (e.g., wheelchair or bedside chair)?: A Little Help needed to walk in hospital room?: A Little Help needed climbing 3-5 steps with a railing? : Total 6 Click Score: 16    End of Session   Activity  Tolerance: Patient tolerated treatment well Patient left: in chair;with call bell/phone within reach (chair alarm under pt but box not in room, RN notified but wife present) Nurse Communication: Mobility status PT Visit Diagnosis: Other abnormalities of gait and mobility (R26.89);Muscle weakness (generalized) (M62.81);History of falling (Z91.81)     Time: [id number] PT Time Calculation (min) (ACUTE ONLY): 23 min  Charges:  $Gait Training: 8-22 mins $Therapeutic Activity: 8-22 mins                     Charlynne Cousins, PT DPT Acute Rehabilitation Services Office [phone]    Luvenia Heller 02/25/2023, 9:53 AM

## 2023-02-26 DIAGNOSIS — R55 Syncope and collapse: Secondary | ICD-10-CM

## 2023-02-26 MED ORDER — ACETAMINOPHEN 500 MG PO TABS: 500.0000 mg | ORAL_TABLET | Freq: Four times a day (QID) | ORAL | Status: AC | PRN

## 2023-02-26 NOTE — Discharge Summary (Signed)
Physician Discharge Summary  Jay Tran [id number] DOB: 1934-11-29  PCP: Leonard Downing, MD  Admitted from: Home Discharged to: SNF  Admit date: 02/22/2023 Discharge date: 02/26/2023  Recommendations for Outpatient Follow-up:    Follow-up Information     Leonard Downing, MD. Schedule an appointment as soon as possible for a visit.   Specialty: Family Medicine Why: To be seen upon discharge from SNF. Contact information: [address] [address] [phone]         MD at SNF. Schedule an appointment as soon as possible for a visit in 3 day(s).   Why: To be seen with repeat labs (CBC & BMP).        Alexis Frock, MD. Daphane Shepherd on 03/10/2023.   Specialty: Urology Why: As per family, patient has an appointment this coming Friday. Contact information: [place] [address] [phone]                  Home Health: None    Equipment/Devices: TBD at SNF    Discharge Condition: Improved and stable   Code Status: Full Code Diet recommendation:  Discharge Diet Orders (From admission, onward)     Start     Ordered   02/26/23 0000  Diet - low sodium heart healthy        02/26/23 1101             Discharge Diagnoses:  Principal Problem:   Acute kidney injury superimposed on chronic kidney disease ([facility]) Active Problems:   UTI (urinary tract infection) due to urinary indwelling Foley catheter (HCC)   Essential hypertension   Benign prostatic hyperplasia with lower urinary tract symptoms   GERD (gastroesophageal reflux disease)   Chronic back pain   Syncope and collapse   Acute cystitis without hematuria   Brief Summary: 89 year old married male, ambulates with the help of cane or a walker, medical history significant for BPH, s/p TURP, chronic back pain and right shoulder pain, hypertension, visual and hearing impaired, gait imbalance and falls, chronic Benadryl use, s/p recent Foley catheter  placement for urinary retention, failed voiding trial at urologist office on day of admission, presented with fall at home, syncope, diarrhea x 1.  Admitted for diagnosis of syncope, acute kidney injury, suspected catheter associated urinary tract infection.  Course complicated by acute diarrhea.  AKI and diarrhea resolved.  Although completed 3 days IV antibiotic for suspected UTI, final urine cultures were negative.      Assessment & Plan:   Acute kidney injury complicating stage II chronic kidney disease: Last known creatinine 0.75 in September 2022, GFR >80.  Presented with creatinine of 1.89.  AKI suspected due to dehydration, HCTZ, lisinopril, NSAID use.  Hold HCTZ and lisinopril.  Renal ultrasound without hydronephrosis.  S/p 2 L IV fluid bolus in the ED followed by maintenance IV fluids.  Avoid nephrotoxics.  AKI resolved.  At time of discharge, discontinued HCTZ indefinitely to avoid risk of hyponatremia in this 89 year old patient and recurrent AKI.  Also discontinued NSAIDs to avoid AKI.  Resume lisinopril.  Follow BMP as outpatient.   Syncope and collapse: Suspect multifactorial.  DD: Orthostasis,?  Bradycardia, polypharmacy including increased dose of Zanaflex, recently started Flomax and may be even vasovagal due to sudden urge for BM.  In ED, BP dropped from supine 129/59-117/51, no standing BPs recorded.  Telemetry showed sinus rhythm without arrhythmias.  TTE from February 2021 showed normal EF and no aortic valve stenosis.  2D echo: LVEF 60-65%, Mild to moderate aortic valve regurgitation.  No aortic valve stenosis. Patient does not drive.   Acute diarrhea: Started on the night of hospital admission.  Had a couple days of multiple diarrhea, loose stools without abdominal pain, nausea or vomiting.  Treated supportively with as needed Imodium.  GI panel was negative.  Low index of suspicion for C. difficile.  KUB without acute findings.  Suspected acute viral versus food poisoning.   Diarrhea has resolved now for about 48 hours.  Hyponatremia: May be multifactorial due to recent diarrhea, GI losses but more likely due to HCTZ.  Stable.  At discharge, DC HCTZ indefinitely.   Mild dilatation of ascending aorta, measuring 44 mm, seen on 2D echo Outpatient follow-up with PCP and evaluation and management as deemed necessary.    Suspected catheter associated UTI: Continue IV ceftriaxone pending urine culture results (pending).  Sepsis ruled out.  Did not have sustained SIRS criteria except leukocytosis.  Completed 3 days of IV ceftriaxone.  Urine culture without growth.   Hypokalemia: Replaced.   Leukocytosis: Suspected leukemoid reaction/stress response.  Predominantly neutrophilic.  Ordered peripheral smear review by pathology.  Leukocytosis has almost resolved.   BPH/acute urinary retention: Although chronic Benadryl may have contributed, not the only precipitating factor given history of prolonged use for approximately a year.  However since Benadryl not helping his upper respiratory congestion for which he was taking it, recommended stopping indefinitely.  Follows with Dr. Tresa Moore, [facility] and has an appointment for next Friday (3/22) to reassess voiding trial.  Continue Flomax and Proscar.   Gait ataxia and falls: Spouse reports 3 falls last year and a fall PTA.  Was getting outpatient PT evaluation twice a week.  PT consulted and recommended SNF.  CT head and C-spine without acute findings.  With PT, ambulated 150 feet on 3/9 with overall minimum assist.  They continue to recommend SNF.   Left shoulder restricted mobility: Noted after fall in May 2023.  Seen by Dr. Jean Rosenthal, orthopedics and referred to her next orthopedic surgeon,?  Shoulder replacement recommended but patient/family do not want to pursue surgery given his advanced age.   Essential hypertension: Held HCTZ and lisinopril due to AKI.  As needed IV hydralazine.  Started on zotepine 5  Mg daily here but at time of discharge resume prior home dose of lisinopril 20 Mg at bedtime.  Discontinued amlodipine at discharge.  Discontinued HCTZ indefinitely due to reasons mentioned above.   Incidental finding on CT C/A/P: 2.6 cm left thyroid nodule: As per recommendations, nonemergent thyroid ultrasound, this can be pursued as an outpatient via PCPs office.   Body mass index is 29.59 kg/m./Overweight       Consultants:       Procedures:   Had indwelling Foley catheter prior to admission and will be discharged on same.  Discharge Instructions  Discharge Instructions     Call MD for:   Complete by: As directed    Recurrent diarrhea.   Call MD for:  difficulty breathing, headache or visual disturbances   Complete by: As directed    Call MD for:  extreme fatigue   Complete by: As directed    Call MD for:  persistant dizziness or light-headedness   Complete by: As directed    Call MD for:  persistant nausea and vomiting   Complete by: As directed    Call MD for:  severe uncontrolled pain   Complete by: As directed  Call MD for:  temperature >100.4   Complete by: As directed    Diet - low sodium heart healthy   Complete by: As directed    Discharge instructions   Complete by: As directed    Patient uses the OTC pain patch for chronic pain of his right shoulder.  It has to be applied to the right shoulder.   Driving Restrictions   Complete by: As directed    No driving for 6 months.   Increase activity slowly   Complete by: As directed         Medication List     STOP taking these medications    ciprofloxacin 500 MG tablet Commonly known as: CIPRO   diphenhydrAMINE 25 MG tablet Commonly known as: BENADRYL   hydrochlorothiazide 50 MG tablet Commonly known as: HYDRODIURIL   naproxen sodium 220 MG tablet Commonly known as: ALEVE       TAKE these medications    acetaminophen 500 MG tablet Commonly known as: TYLENOL Take 1-2 tablets (500-1,000  mg total) by mouth every 6 (six) hours as needed for mild pain, headache or moderate pain. What changed: reasons to take this   aspirin EC 81 MG tablet Take 81 mg by mouth in the morning. Swallow whole.   atorvastatin 40 MG tablet Commonly known as: LIPITOR TAKE 1 TABLET BY MOUTH ONCE DAILY AT  6  PM What changed: See the new instructions.   finasteride 5 MG tablet Commonly known as: PROSCAR Take 5 mg by mouth at bedtime.   Fish Oil 1200 MG Caps Take 1,200 mg by mouth daily with breakfast.   lisinopril 20 MG tablet Commonly known as: ZESTRIL Take 20 mg by mouth at bedtime.   ondansetron 8 MG tablet Commonly known as: ZOFRAN Take 8 mg by mouth every 8 (eight) hours as needed for nausea or vomiting.   PRESERVISION AREDS 2 PO Take 1 capsule by mouth 2 (two) times daily.   QC TUMERIC COMPLEX PO Take 500 mg by mouth daily with breakfast.   SALONPAS PAIN RELIEF PATCH EX Place 1 patch onto the skin daily as needed (to affected area for pain).   Systane 0.4-0.3 % Soln Generic drug: Polyethyl Glycol-Propyl Glycol Place 1 drop into both eyes 3 (three) times daily as needed (for dryness).   tamsulosin 0.4 MG Caps capsule Commonly known as: FLOMAX Take 0.4 mg by mouth at bedtime.   tiZANidine 4 MG tablet Commonly known as: Zanaflex Take 1 tablet (4 mg total) by mouth every 8 (eight) hours as needed for muscle spasms. What changed:  how much to take when to take this       Allergies  Allergen Reactions   Codeine Itching and Nausea And Vomiting   Other Nausea Only    Anesthesia -  given with medication for nausea    Tramadol Itching and Nausea Only      Procedures/Studies: DG Abd Portable 1V  Result Date: 02/24/2023 CLINICAL DATA:  Diarrhea EXAM: PORTABLE ABDOMEN - 1 VIEW COMPARISON:  CT abdomen 02/22/2023 FINDINGS: Dorsal column stimulator device noted with leads projecting at T8 and T9. Posterolateral rod and pedicle screw fixators at L1-L2-L3 with interbody  fusion markers at these levels and also at L4-5 and L5-S1 Moderate degenerative hip arthropathy bilaterally. A left abdominal loop of small bowel is mildly dilated at 3.4 cm diameter. No other dilated bowel observed. Rounded metal density projects over the right lower quadrant of the abdomen, this was shown to be near the  appendiceal tip on recent CT. Cholelithiasis noted. IMPRESSION: 1. Mildly dilated short segment of small bowel in the left mid abdomen measuring 3.4 cm diameter. Significance uncertain. No other dilated bowel observed. 2. Cholelithiasis. 3. Postoperative findings in the lumbar spine. 4. Dorsal column stimulator device noted with leads projecting at T8 and T9. Electronically Signed   By: Van Clines M.D.   On: 02/24/2023 12:20   ECHOCARDIOGRAM COMPLETE  Result Date: 02/23/2023    ECHOCARDIOGRAM REPORT   Patient Name:   Jay Tran Date of Exam: 02/23/2023 Medical Rec #:  [id number]     Height:       69.0 in Accession #:    [id number]    Weight:       200.0 lb Date of Birth:  22-Dec-1933     BSA:          2.066 m Patient Age:    46 years      BP:           134/83 mmHg Patient Gender: M             HR:           63 bpm. Exam Location:  Inpatient Procedure: 2D Echo, Cardiac Doppler and Color Doppler Indications:    syncope  History:        Patient has prior history of Echocardiogram examinations.                 Stroke; Risk Factors:Dyslipidemia and Hypertension.  Sonographer:    Phineas Douglas Referring Phys: Stanton  1. Left ventricular ejection fraction, by estimation, is 60 to 65%. The left ventricle has normal function. The left ventricle has no regional Hepner motion abnormalities. There is moderate left ventricular hypertrophy. Left ventricular diastolic parameters are indeterminate.  2. Right ventricular systolic function is normal. The right ventricular size is normal. Tricuspid regurgitation signal is inadequate for assessing PA pressure.  3. Left atrial size  was moderately dilated.  4. Right atrial size was mildly dilated.  5. The mitral valve is normal in structure. No evidence of mitral valve regurgitation.  6. The aortic valve is tricuspid. There is mild calcification of the aortic valve. There is mild thickening of the aortic valve. Aortic valve regurgitation is mild to moderate. Aortic valve sclerosis is present, with no evidence of aortic valve stenosis.  7. There is mild dilatation of the ascending aorta, measuring 44 mm.  8. The inferior vena cava is dilated in size with >50% respiratory variability, suggesting right atrial pressure of 8 mmHg. FINDINGS  Left Ventricle: Left ventricular ejection fraction, by estimation, is 60 to 65%. The left ventricle has normal function. The left ventricle has no regional S[place]a motion abnormalities. The left ventricular internal cavity size was normal in size. There is  moderate left ventricular hypertrophy. Left ventricular diastolic parameters are indeterminate. Normal left ventricular filling pressure. Right Ventricle: The right ventricular size is normal. No increase in right ventricular Umscheid thickness. Right ventricular systolic function is normal. Tricuspid regurgitation signal is inadequate for assessing PA pressure. Left Atrium: Left atrial size was moderately dilated. Right Atrium: Right atrial size was mildly dilated. Pericardium: There is no evidence of pericardial effusion. Mitral Valve: The mitral valve is normal in structure. No evidence of mitral valve regurgitation. Tricuspid Valve: The tricuspid valve is normal in structure. Tricuspid valve regurgitation is trivial. Aortic Valve: The aortic valve is tricuspid. There is mild calcification of the aortic valve. There is mild  thickening of the aortic valve. Aortic valve regurgitation is mild to moderate. Aortic valve sclerosis is present, with no evidence of aortic valve stenosis. Pulmonic Valve: The pulmonic valve was grossly normal. Pulmonic valve regurgitation is  mild. Aorta: The aortic root is normal in size and structure. There is mild dilatation of the ascending aorta, measuring 44 mm. Venous: The inferior vena cava is dilated in size with greater than 50% respiratory variability, suggesting right atrial pressure of 8 mmHg. IAS/Shunts: No atrial level shunt detected by color flow Doppler.  LEFT VENTRICLE PLAX 2D LVIDd:         4.40 cm      Diastology LVIDs:         2.70 cm      LV e' medial:    7.40 cm/s LV PW:         1.40 cm      LV E/e' medial:  11.0 LV IVS:        1.40 cm      LV e' lateral:   9.25 cm/s LVOT diam:     2.20 cm      LV E/e' lateral: 8.8 LV SV:         84 LV SV Index:   41 LVOT Area:     3.80 cm  LV Volumes (MOD) LV vol d, MOD A2C: 120.0 ml LV vol d, MOD A4C: 130.0 ml LV vol s, MOD A2C: 39.4 ml LV vol s, MOD A4C: 43.1 ml LV SV MOD A2C:     80.6 ml LV SV MOD A4C:     130.0 ml LV SV MOD BP:      87.0 ml RIGHT VENTRICLE             IVC RV Basal diam:  4.00 cm     IVC diam: 2.40 cm RV S prime:     26.90 cm/s TAPSE (M-mode): 3.4 cm LEFT ATRIUM             Index        RIGHT ATRIUM           Index LA diam:        4.70 cm 2.27 cm/m   RA Area:     22.30 cm LA Vol (A2C):   72.7 ml 35.19 ml/m  RA Volume:   58.00 ml  28.07 ml/m LA Vol (A4C):   55.0 ml 26.62 ml/m LA Biplane Vol: 64.3 ml 31.12 ml/m  AORTIC VALVE LVOT Vmax:   139.00 cm/s LVOT Vmean:  73.500 cm/s LVOT VTI:    0.221 m  AORTA Ao Root diam: 3.90 cm Ao Asc diam:  4.40 cm MITRAL VALVE MV Area (PHT): 4.12 cm    SHUNTS MV Decel Time: 184 msec    Systemic VTI:  0.22 m MV E velocity: 81.50 cm/s  Systemic Diam: 2.20 cm MV A velocity: 78.20 cm/s MV E/A ratio:  1.04 Mihai Croitoru MD Electronically signed by Sanda Klein MD Signature Date/Time: 02/23/2023/2:25:04 PM    Final    CT CHEST ABDOMEN PELVIS WO CONTRAST  Result Date: 02/22/2023 CLINICAL DATA:  Sepsis EXAM: CT CHEST, ABDOMEN AND PELVIS WITHOUT CONTRAST TECHNIQUE: Multidetector CT imaging of the chest, abdomen and pelvis was performed following  the standard protocol without IV contrast. RADIATION DOSE REDUCTION: This exam was performed according to the departmental dose-optimization program which includes automated exposure control, adjustment of the mA and/or kV according to patient size and/or use of iterative reconstruction technique. COMPARISON:  05/27/2010 FINDINGS:  CT CHEST FINDINGS Cardiovascular: Heart is normal size. Aorta is normal caliber. Coronary artery and aortic calcifications. Mediastinum/Nodes: No mediastinal, hilar, or axillary adenopathy. Trachea and esophagus are unremarkable. 2.6 cm solid-appearing nodule in the left thyroid lobe. Lungs/Pleura: Bibasilar atelectasis. Otherwise no confluent opacities or effusions. Musculoskeletal: Chest [place] soft tissues are unremarkable. No acute bony abnormality. Spinal stimulator in place in the lower thoracic spine. CT ABDOMEN PELVIS FINDINGS Hepatobiliary: Small gallstones within the gallbladder. No biliary ductal dilatation or focal hepatic abnormality. Pancreas: No focal abnormality or ductal dilatation. Spleen: Benign appearing calcifications in the spleen.  Normal size. Adrenals/Urinary Tract: No renal or adrenal mass. No stones or hydronephrosis. Urinary bladder decompressed with Foley catheter in place. Stomach/Bowel: Diffuse colonic diverticulosis, most pronounced in the sigmoid colon. No active diverticulitis. Stomach and small bowel decompressed. No bowel obstruction. Vascular/Lymphatic: Aortic atherosclerosis. No evidence of aneurysm or adenopathy. Reproductive: No visible focal abnormality. Other: No free fluid or free air. Musculoskeletal: No acute bony abnormality. Postoperative changes and degenerative changes in the lumbar spine. IMPRESSION: No acute findings in the chest, abdomen or pelvis. Coronary artery disease, aortic atherosclerosis. Bibasilar atelectasis. Cholelithiasis.  No CT evidence of acute cholecystitis. Colonic diverticulosis.  No active diverticulitis. 2.6 cm left  thyroid nodule. Recommend non emergent thyroid US (ref: J Am Coll Radiol. 2015 Feb;12(2): 143-50). Electronically Signed   By: Rolm Baptise M.D.   On: 02/22/2023 03:34   CT CERVICAL SPINE WO CONTRAST  Result Date: 02/22/2023 CLINICAL DATA:  Neck trauma (Age >= 65y).  Syncope EXAM: CT CERVICAL SPINE WITHOUT CONTRAST TECHNIQUE: Multidetector CT imaging of the cervical spine was performed without intravenous contrast. Multiplanar CT image reconstructions were also generated. RADIATION DOSE REDUCTION: This exam was performed according to the departmental dose-optimization program which includes automated exposure control, adjustment of the mA and/or kV according to patient size and/or use of iterative reconstruction technique. COMPARISON:  None Available. FINDINGS: Alignment: No subluxation Skull base and vertebrae: No acute fracture. No primary bone lesion or focal pathologic process. Soft tissues and spinal canal: No prevertebral fluid or swelling. No visible canal hematoma. 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 Diffuse advanced degenerative disc disease and facet disease. Upper chest: No acute findings Other: None IMPRESSION: Advanced diffuse degenerative disc and facet disease. No acute bony abnormality. Electronically Signed   By: Rolm Baptise M.D.   On: 02/22/2023 03:30   CT HEAD WO CONTRAST  Result Date: 02/22/2023 CLINICAL DATA:  Syncope EXAM: CT HEAD WITHOUT CONTRAST TECHNIQUE: Contiguous axial images were obtained from the base of the skull through the vertex without intravenous contrast. RADIATION DOSE REDUCTION: This exam was performed according to the departmental dose-optimization program which includes automated exposure control, adjustment of the mA and/or kV according to patient size and/or use of iterative reconstruction technique. COMPARISON:  02/04/2020 FINDINGS: Brain: There is atrophy and chronic small vessel disease changes. No acute intracranial abnormality. Specifically, no hemorrhage,  hydrocephalus, mass lesion, acute infarction, or significant intracranial injury. Vascular: No hyperdense vessel or unexpected calcification. Skull: No acute calvarial abnormality. Sinuses/Orbits: No acute findings Other: None IMPRESSION: Atrophy, chronic microvascular disease. No acute intracranial abnormality. Electronically Signed   By: Rolm Baptise M.D.   On: 02/22/2023 03:28   DG Elbow 2 Views Left  Result Date: 02/22/2023 CLINICAL DATA:  Fall EXAM: LEFT ELBOW - 2 VIEW COMPARISON:  None Available. FINDINGS: No acute bony abnormality. Specifically, no fracture, subluxation, or dislocation. No joint effusion. Soft tissues are intact. IMPRESSION: No acute bony abnormality. Electronically Signed  By: Rolm Baptise M.D.   On: 02/22/2023 01:31   DG Chest Port 1 View  Result Date: 02/22/2023 CLINICAL DATA:  Fall, syncope EXAM: PORTABLE CHEST 1 VIEW COMPARISON:  07/06/2021 FINDINGS: Heart is mildly enlarged. Vascular congestion. Bibasilar atelectasis. No overt edema, effusions or pneumothorax. No acute bony abnormality. IMPRESSION: Cardiomegaly, vascular congestion. Bibasilar atelectasis. Electronically Signed   By: Rolm Baptise M.D.   On: 02/22/2023 01:31      Subjective: Denies complaints.  Had a BM yesterday morning and states that stool was "getting thicker".  As per RN, no acute issues noted.  He states that he has chronic right shoulder pain and the home salon pass patch helps him better than the lidocaine patch that he has here.  Discharge Exam:  Vitals:   02/26/23 0000 02/26/23 0315 02/26/23 0400 02/26/23 0809  BP: (!) 157/73  138/65 (!) 184/83  Pulse: 80  74 79  Resp: '20  20 18  '$ Temp:  98.3 F (36.8 C)    TempSrc:  Oral    SpO2: 93%  93% 93%  Weight:   91 kg   Height:        General exam: Pleasant elderly male, moderately built and overweight lying comfortably propped up in bed without distress.  Oral mucosa moist.  Hard of hearing and has hearing aid in right ear.  Bilateral  intraocular lens, blind in right eye and diminished visual acuity in the left eye. Respiratory system: Clear to auscultation.  No increased work of breathing. Cardiovascular system: S1 & S2 heard, RRR. No JVD, murmurs, rubs, gallops or clicks. No pedal edema.  Gastrointestinal system: Abdomen is nondistended, soft and nontender. Normal bowel sounds heard. GU: Has indwelling Foley catheter present. Central nervous system: Alert and oriented. No focal neurological deficits. Extremities: Symmetric 5 x 5 power.  Tiny bruise over left elbow.  Also has a chronic or old appearing bruise over left anterior shoulder. Skin: No rashes, lesions or ulcers Psychiatry: Judgement and insight may be somewhat impaired since admission but suspect this is his baseline. Mood & affect appropriate.     The results of significant diagnostics from this hospitalization (including imaging, microbiology, ancillary and laboratory) are listed below for reference.     Microbiology: Recent Results (from the past 240 hour(s))  Resp panel by RT-PCR (RSV, Flu A&B, Covid)     Status: None   Collection Time: 02/22/23  2:30 AM   Specimen: Nasal Swab  Result Value Ref Range Status   SARS Coronavirus 2 by RT PCR NEGATIVE NEGATIVE Final   Influenza A by PCR NEGATIVE NEGATIVE Final   Influenza B by PCR NEGATIVE NEGATIVE Final    Comment: (NOTE) The Xpert Xpress SARS-CoV-2/FLU/RSV plus assay is intended as an aid in the diagnosis of influenza from Nasopharyngeal swab specimens and should not be used as a sole basis for treatment. Nasal washings and aspirates are unacceptable for Xpert Xpress SARS-CoV-2/FLU/RSV testing.  Fact Sheet for Patients: EntrepreneurPulse.com.au  Fact Sheet for Healthcare Providers: IncredibleEmployment.be  This test is not yet approved or cleared by the [place] FDA and has been authorized for detection and/or diagnosis of SARS-CoV-2 by FDA under an  Emergency Use Authorization (EUA). This EUA will remain in effect (meaning this test can be used) for the duration of the COVID-19 declaration under Section 564(b)(1) of the Act, 21 U.S.C. section 360bbb-3(b)(1), unless the authorization is terminated or revoked.     Resp Syncytial Virus by PCR NEGATIVE NEGATIVE Final  Comment: (NOTE) Fact Sheet for Patients: EntrepreneurPulse.com.au  Fact Sheet for Healthcare Providers: IncredibleEmployment.be  This test is not yet approved or cleared by the [place] FDA and has been authorized for detection and/or diagnosis of SARS-CoV-2 by FDA under an Emergency Use Authorization (EUA). This EUA will remain in effect (meaning this test can be used) for the duration of the COVID-19 declaration under Section 564(b)(1) of the Act, 21 U.S.C. section 360bbb-3(b)(1), unless the authorization is terminated or revoked.  Performed at [facility], [place] [address] [place], [place] [address]   Blood Culture (routine x 2)     Status: None (Preliminary result)   Collection Time: 02/22/23  2:30 AM   Specimen: BLOOD RIGHT HAND  Result Value Ref Range Status   Specimen Description BLOOD RIGHT HAND  Final   Special Requests   Final    BOTTLES DRAWN AEROBIC AND ANAEROBIC Blood Culture adequate volume   Culture   Final    NO GROWTH 4 DAYS Performed at [facility], [place] [address], [place], [place] [address]    Report Status PENDING  Incomplete  Blood Culture (routine x 2)     Status: None (Preliminary result)   Collection Time: 02/22/23  2:35 AM   Specimen: BLOOD LEFT HAND  Result Value Ref Range Status   Specimen Description BLOOD LEFT HAND  Final   Special Requests   Final    BOTTLES DRAWN AEROBIC AND ANAEROBIC Blood Culture results may not be optimal due to an inadequate volume of blood received in culture bottles   Culture   Final    NO GROWTH 4 DAYS Performed at [facility], [place]  [address], [place], [place] [address]    Report Status PENDING  Incomplete  Urine Culture     Status: None   Collection Time: 02/22/23  3:30 AM   Specimen: Urine, Catheterized  Result Value Ref Range Status   Specimen Description URINE, CATHETERIZED  Final   Special Requests NONE Reflexed from [id number]  Final   Culture   Final    NO GROWTH Performed at [facility], [place] [address], [place], [place] [address]    Report Status 02/23/2023 FINAL  Final  Gastrointestinal Panel by PCR , Stool     Status: None   Collection Time: 02/24/23  4:00 AM   Specimen: Stool  Result Value Ref Range Status   Campylobacter species NOT DETECTED NOT DETECTED Final   Plesimonas shigelloides NOT DETECTED NOT DETECTED Final   Salmonella species NOT DETECTED NOT DETECTED Final   Yersinia enterocolitica NOT DETECTED NOT DETECTED Final   Vibrio species NOT DETECTED NOT DETECTED Final   Vibrio cholerae NOT DETECTED NOT DETECTED Final   Enteroaggregative E coli (EAEC) NOT DETECTED NOT DETECTED Final   Enteropathogenic E coli (EPEC) NOT DETECTED NOT DETECTED Final   Enterotoxigenic E coli (ETEC) NOT DETECTED NOT DETECTED Final   Shiga like toxin producing E coli (STEC) NOT DETECTED NOT DETECTED Final   Shigella/Enteroinvasive E coli (EIEC) NOT DETECTED NOT DETECTED Final   Cryptosporidium NOT DETECTED NOT DETECTED Final   Cyclospora cayetanensis NOT DETECTED NOT DETECTED Final   Entamoeba histolytica NOT DETECTED NOT DETECTED Final   Giardia lamblia NOT DETECTED NOT DETECTED Final   Adenovirus F40/41 NOT DETECTED NOT DETECTED Final   Astrovirus NOT DETECTED NOT DETECTED Final   Norovirus GI/GII NOT DETECTED NOT DETECTED Final   Rotavirus A NOT DETECTED NOT DETECTED Final   Sapovirus (I,  II, IV, and V) NOT DETECTED NOT DETECTED Final    Comment: Performed at [facility], [place] [place], [place] [address]     Labs: CBC: Recent Labs  Lab 02/22/23 0105 02/22/23 0903 02/23/23 0558  02/24/23 1032 02/25/23 0333  WBC 30.2* 21.3* 11.9* 10.2 11.7*  NEUTROABS 27.0* 18.5*  --   --   --   HGB 13.2 11.3* 10.7* 12.2* 11.5*  HCT 37.9* 33.8* 30.7* 35.4* 32.8*  MCV 95.9 97.1 93.9 94.1 93.2  PLT 352 313 270 366 [id number]    Basic Metabolic Panel: Recent Labs  Lab 02/22/23 0105 02/22/23 0903 02/23/23 0558 02/24/23 1032 02/25/23 0333  NA 131* 132* 131* 132* 133*  K 3.4* 4.3 3.8 3.5 3.9  CL 93* 97* 98 97* 99  CO2 '22 25 24 '$ 21* 24  GLUCOSE 137* 124* 116* 171* 124*  BUN 25* '22 8 8 '$ 6*  CREATININE 1.89* 1.27* 0.72 0.75 0.73  CALCIUM 9.2 8.5* 8.8* 9.5 9.2  MG 1.8  --   --   --   --     Liver Function Tests: Recent Labs  Lab 02/22/23 0105  AST 27  ALT 7  ALKPHOS 54  BILITOT 0.9  PROT 6.8  ALBUMIN 3.5    Urinalysis    Component Value Date/Time   COLORURINE AMBER (A) 02/22/2023 0330   APPEARANCEUR CLOUDY (A) 02/22/2023 0330   LABSPEC 1.019 02/22/2023 0330   PHURINE 5.0 02/22/2023 0330   GLUCOSEU NEGATIVE 02/22/2023 0330   HGBUR LARGE (A) 02/22/2023 0330   BILIRUBINUR NEGATIVE 02/22/2023 0330   KETONESUR NEGATIVE 02/22/2023 0330   PROTEINUR 100 (A) 02/22/2023 0330   UROBILINOGEN 0.2 05/27/2010 1530   NITRITE NEGATIVE 02/22/2023 0330   LEUKOCYTESUR LARGE (A) 02/22/2023 0330      Time coordinating discharge: 25 minutes  SIGNED:  Vernell Leep, MD,  FACP, FHM, [place], [facility], [facility]   [facility] Ad[facility] [place]     To contact the attending provider between 7A-7P or the covering provider during after hours 7P-7A, please log into the web site [email or web address] and access using universal [place] password for that web site. If you do not have the password, please call the hospital operator.

## 2023-02-26 NOTE — TOC Transition Note (Addendum)
Transition of Care [facility]) - CM/SW Discharge Note   Patient Details  Name: Jay Tran MRN: [id number] Date of Birth: [birth year]  Transition of [facility]) CM/SW Contact:  Elliot Gurney [facility], [place] Phone Number: 02/26/2023, 9:22 AM   Clinical Narrative:    Confirmed with Admissions Director Olivia Mackie. Patient able to admit today. Patient will be going to room 105. RN to call report to [phone]. Patient to be transported by [facility].  11:31am [facility] contacted for transport. Patient's spouse informed.  Shanvi Moyd, LCSW Transition of Care      Barriers to Discharge: Continued Medical Work up, Inadequate or no insurance, SNF Pending bed offer   Patient Goals and CMS Choice CMS Medicare.gov Compare Post Acute Care list provided to:: Patient Choice offered to / list presented to : Patient, Spouse, Adult Children  Discharge Placement                         Discharge Plan and Services Additional resources added to the After Visit Summary for   In-house Referral: Clinical Social Work   Post Acute Care Choice: [place]                               Social Determinants of Health ([place]) Interventions SDOH Screenings   Food Insecurity: No Food Insecurity (02/22/2023)  Housing: [place]  (02/22/2023)  Transportation Needs: No Transportation Needs (02/22/2023)  Utilities: Not At Risk (02/22/2023)  Tobacco Use: Medium Risk (02/22/2023)     Readmission Risk Interventions     No data to display

## 2023-02-26 NOTE — Discharge Instructions (Signed)

## 2023-02-27 ENCOUNTER — Encounter: Payer: Medicare Other | Admitting: Rehabilitative and Restorative Service Providers"

## 2023-02-27 LAB — CULTURE, BLOOD (ROUTINE X 2)
Culture: NO GROWTH
Culture: NO GROWTH
Special Requests: ADEQUATE

## 2023-03-02 ENCOUNTER — Encounter: Payer: Medicare Other | Admitting: Rehabilitative and Restorative Service Providers"

## 2023-03-06 ENCOUNTER — Encounter: Payer: Medicare Other | Admitting: Rehabilitative and Restorative Service Providers"

## 2023-03-08 ENCOUNTER — Encounter: Payer: Medicare Other | Admitting: Rehabilitative and Restorative Service Providers"

## 2023-03-13 ENCOUNTER — Encounter: Payer: Medicare Other | Admitting: Rehabilitative and Restorative Service Providers"

## 2023-03-15 ENCOUNTER — Encounter: Payer: Medicare Other | Admitting: Rehabilitative and Restorative Service Providers"

## 2023-03-20 ENCOUNTER — Encounter: Payer: Self-pay | Admitting: Physician Assistant

## 2023-03-20 ENCOUNTER — Ambulatory Visit (INDEPENDENT_AMBULATORY_CARE_PROVIDER_SITE_OTHER): Payer: Medicare Other | Admitting: Physician Assistant

## 2023-03-20 ENCOUNTER — Ambulatory Visit (HOSPITAL_COMMUNITY)
Admission: RE | Admit: 2023-03-20 | Discharge: 2023-03-20 | Disposition: A | Discharge status: Home / Self Care | Payer: Medicare Other | Source: Ambulatory Visit | Attending: Vascular Surgery | Admitting: Vascular Surgery

## 2023-03-20 VITALS — BP 184/65 | HR 67 | Temp 98.3°F

## 2023-03-20 DIAGNOSIS — I6523 Occlusion and stenosis of bilateral carotid arteries: Secondary | ICD-10-CM

## 2023-03-20 NOTE — Progress Notes (Unsigned)
Office Note   History of Present Illness   Jay Tran is a 89 y.o. (August 02, 1934) male who presents for surveillance of carotid artery stenosis.  He underwent right carotid endarterectomy on 02/07/2020 by Dr. Myra Gianotti.  This was done for symptomatic stenosis with right retinal artery occlusion.  He has a persistent vision deficit in his right eye.   He returns today for follow-up.  He denies any stroke or TIA-like symptoms such as slurred speech, facial droop, sudden weakness or numbness.  He has a history of macular degeneration which has caused worsening vision bilaterally.  He states that he follows up with his neurologist regularly.  He denies any claudication or rest pain.  He is still taking his aspirin and statin.  Current Outpatient Medications  Medication Sig Dispense Refill   acetaminophen (TYLENOL) 500 MG tablet Take 1-2 tablets (500-1,000 mg total) by mouth every 6 (six) hours as needed for mild pain, headache or moderate pain.     aspirin EC 81 MG tablet Take 81 mg by mouth in the morning. Swallow whole.     atorvastatin (LIPITOR) 40 MG tablet TAKE 1 TABLET BY MOUTH ONCE DAILY AT  6  PM (Patient taking differently: Take 40 mg by mouth at bedtime.) 90 tablet 3   finasteride (PROSCAR) 5 MG tablet Take 5 mg by mouth at bedtime.      Liniments (SALONPAS PAIN RELIEF PATCH EX) Place 1 patch onto the skin daily as needed (to affected area for pain).      lisinopril (PRINIVIL,ZESTRIL) 20 MG tablet Take 20 mg by mouth at bedtime.      Multiple Vitamins-Minerals (PRESERVISION AREDS 2 PO) Take 1 capsule by mouth 2 (two) times daily.      Omega-3 Fatty Acids (FISH OIL) 1200 MG CAPS Take 1,200 mg by mouth daily with breakfast.      ondansetron (ZOFRAN) 8 MG tablet Take 8 mg by mouth every 8 (eight) hours as needed for nausea or vomiting.     SYSTANE 0.4-0.3 % SOLN Place 1 drop into both eyes 3 (three) times daily as needed (for dryness).     Tamsulosin HCl (FLOMAX) 0.4 MG CAPS Take 0.4 mg  by mouth at bedtime.      tiZANidine (ZANAFLEX) 4 MG tablet Take 1 tablet (4 mg total) by mouth every 8 (eight) hours as needed for muscle spasms. (Patient taking differently: Take 4-8 mg by mouth at bedtime.) 60 tablet 1   Turmeric (QC TUMERIC COMPLEX PO) Take 500 mg by mouth daily with breakfast.     No current facility-administered medications for this visit.    REVIEW OF SYSTEMS (negative unless checked):   Cardiac:  []  Chest pain or chest pressure? []  Shortness of breath upon activity? []  Shortness of breath when lying flat? []  Irregular heart rhythm?  Vascular:  []  Pain in calf, thigh, or hip brought on by walking? []  Pain in feet at night that wakes you up from your sleep? []  Blood clot in your veins? []  Leg swelling?  Pulmonary:  []  Oxygen at home? []  Productive cough? []  Wheezing?  Neurologic:  []  Sudden weakness in arms or legs? []  Sudden numbness in arms or legs? []  Sudden onset of difficult speaking or slurred speech? []  Temporary loss of vision in one eye? []  Problems with dizziness?  Gastrointestinal:  []  Blood in stool? []  Vomited blood?  Genitourinary:  []  Burning when urinating? []  Blood in urine?  Psychiatric:  []  Major depression  Hematologic:  []   Bleeding problems? []  Problems with blood clotting?  Dermatologic:  []  Rashes or ulcers?  Constitutional:  []  Fever or chills?  Ear/Nose/Throat:  []  Change in hearing? []  Nose bleeds? []  Sore throat?  Musculoskeletal:  []  Back pain? []  Joint pain? []  Muscle pain?   Physical Examination   Vitals:   03/20/23 1444 03/20/23 1451  BP: (!) 172/77 (!) 184/65  Pulse: 67   Temp: 98.3 F (36.8 C)   TempSrc: Temporal   SpO2: 92%    There is no height or weight on file to calculate BMI.  General:  WDWN in NAD; vital signs documented above Gait: Not observed HENT: WNL, normocephalic Pulmonary: normal non-labored breathing , without Rales, rhonchi,  wheezing Cardiac: Regular Abdomen:  soft, NT, no masses Skin: without rashes Vascular Exam/Pulses: 2+ radial pulses bilaterally Extremities: Without ischemic changes, without gangrene, without open wounds Musculoskeletal: no muscle wasting or atrophy  Neurologic: A&O X 3;  No focal weakness or paresthesias are detected Psychiatric:  The pt has Normal affect.  Non-Invasive Vascular Imaging   B Carotid Duplex (03/20/2023):  R ICA stenosis:  1-39% R VA:  patent and antegrade L ICA stenosis:  1-39% L VA:  patent and antegrade   Medical Decision Making   Jay Tran is a 89 y.o. male who presents for surveillance of carotid artery stenosis  Based on the patient's vascular studies, his carotid artery stenosis bilaterally is unchanged at 1 to 39% He denies any stroke or TIA-like symptoms such as sudden changes to vision, slurred speech, facial droop, or sudden weakness/numbness On exam he has palpable and equal radial pulses 2+ He can continue his aspirin and statin.  He will follow-up with our office in 1 year with repeat carotid studies  Loel Dubonnet PA-C [facility] Office: [phone]  Call MD: Chestine Spore

## 2023-03-27 ENCOUNTER — Ambulatory Visit (INDEPENDENT_AMBULATORY_CARE_PROVIDER_SITE_OTHER): Payer: Medicare Other | Admitting: Ophthalmology

## 2023-03-27 ENCOUNTER — Encounter (INDEPENDENT_AMBULATORY_CARE_PROVIDER_SITE_OTHER): Payer: Medicare Other | Admitting: Ophthalmology

## 2023-03-27 ENCOUNTER — Encounter (INDEPENDENT_AMBULATORY_CARE_PROVIDER_SITE_OTHER): Payer: Self-pay | Admitting: Ophthalmology

## 2023-03-27 DIAGNOSIS — Z961 Presence of intraocular lens: Secondary | ICD-10-CM

## 2023-03-27 DIAGNOSIS — I10 Essential (primary) hypertension: Secondary | ICD-10-CM

## 2023-03-27 DIAGNOSIS — H353232 Exudative age-related macular degeneration, bilateral, with inactive choroidal neovascularization: Secondary | ICD-10-CM

## 2023-03-27 DIAGNOSIS — H35033 Hypertensive retinopathy, bilateral: Secondary | ICD-10-CM

## 2023-03-27 NOTE — Progress Notes (Addendum)
[facility] - Clinic Note  03/27/2023     CHIEF COMPLAINT Patient presents for Retina Evaluation   HISTORY OF PRESENT ILLNESS: Jay Tran is a 89 y.o. male who presents to the clinic today for:  HPI     Retina Evaluation   In both eyes.  I, the attending physician,  performed the HPI with the patient and updated documentation appropriately.        Comments   Patient here for Retina Evaluation. Patient states vision getting worse. Reason he is here afraid going blind. No eye pain. Has had a fall. Went to for rehab. Had issues with blood pressure. Was taken off HCTZ. BP this am was 158/72. Last night was 212/97.      Last edited by Rennis Chris, MD on 03/27/2023  4:38 PM.    Pt is a [name] pt here bc he feels like his vision has gotten worse since he was here last (02.29.24), pt is s/p Vabysmo OS, last injection was in June 2023 with [name], pts wife states he had an injection in December 2023 with [name], pt saw [name] at [facility] in January, but did not have an injection, pt is unable to say how his vision has gotten worse, just that it is blurry, pts BP is running higher than it should be bc he was taken off HCTZ  Referring physician: Sherrie George, MD [address] [place],  [place] [address]  HISTORICAL INFORMATION:  Selected notes from the MEDICAL RECORD NUMBER Jay Tran pt complaining of decreased VA LEE:  Ocular Hx- PMH-   CURRENT MEDICATIONS: Current Outpatient Medications (Ophthalmic Drugs)  Medication Sig   SYSTANE 0.4-0.3 % SOLN Place 1 drop into both eyes 3 (three) times daily as needed (for dryness).   No current facility-administered medications for this visit. (Ophthalmic Drugs)   Current Outpatient Medications (Other)  Medication Sig   acetaminophen (TYLENOL) 500 MG tablet Take 1-2 tablets (500-1,000 mg total) by mouth every 6 (six) hours as needed for mild pain, headache or moderate pain.   aspirin EC 81 MG  tablet Take 81 mg by mouth in the morning. Swallow whole.   atorvastatin (LIPITOR) 40 MG tablet TAKE 1 TABLET BY MOUTH ONCE DAILY AT  6  PM (Patient taking differently: Take 40 mg by mouth at bedtime.)   finasteride (PROSCAR) 5 MG tablet Take 5 mg by mouth at bedtime.    Liniments (SALONPAS PAIN RELIEF PATCH EX) Place 1 patch onto the skin daily as needed (to affected area for pain).    lisinopril (PRINIVIL,ZESTRIL) 20 MG tablet Take 20 mg by mouth at bedtime.    Multiple Vitamins-Minerals (PRESERVISION AREDS 2 PO) Take 1 capsule by mouth 2 (two) times daily.    Omega-3 Fatty Acids (FISH OIL) 1200 MG CAPS Take 1,200 mg by mouth daily with breakfast.    ondansetron (ZOFRAN) 8 MG tablet Take 8 mg by mouth every 8 (eight) hours as needed for nausea or vomiting.   Tamsulosin HCl (FLOMAX) 0.4 MG CAPS Take 0.4 mg by mouth at bedtime.    tiZANidine (ZANAFLEX) 4 MG tablet Take 1 tablet (4 mg total) by mouth every 8 (eight) hours as needed for muscle spasms. (Patient taking differently: Take 4-8 mg by mouth at bedtime.)   Turmeric (QC TUMERIC COMPLEX PO) Take 500 mg by mouth daily with breakfast.   No current facility-administered medications for this visit. (Other)   REVIEW OF SYSTEMS: ROS  Positive for: Eyes Last edited by [name], [name], COA on 03/27/2023 12:55 PM.     ALLERGIES Allergies  Allergen Reactions   Codeine Itching and Nausea And Vomiting   Other Nausea Only    Anesthesia -  given with medication for nausea    Tramadol Itching and Nausea Only   PAST MEDICAL HISTORY 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    Hx: of after 2013 back surgery   GERD (gastroesophageal reflux disease)    Hearing aid worn    Hx: of right ear   Hyperlipemia    Hypertension    Lumbar herniated disc    Lumbar stenosis    Macular degeneration right eye  dry   PONV (postoperative nausea and vomiting)    takes iv nausea meds    Seasonal allergies    Hx: of   Stroke    Past Surgical History:  Procedure Laterality Date   BACK SURGERY  2013, x 3 with dr Yetta Barrejones 2014, 2016 and 2019   x 4, takes back injections monthly   COLONOSCOPY     Hx: of   ENDARTERECTOMY Right 02/07/2020   Procedure: ENDARTERECTOMY CAROTID;  Surgeon: Nada LibmanBrabham, Vance W, MD;  [facility];  Service: Vascular;  Laterality: Right;   EYE SURGERY     bil catarcts 02/2016   HERNIA REPAIR  several yrs ago   inguinal   LUMBAR LAMINECTOMY  10/26/2012   Procedure: MICRODISCECTOMY LUMBAR LAMINECTOMY;  Surgeon: Eldred MangesMark C Yates, MD;  Location: [facility] OR;  Service: Orthopedics;  Laterality: Right;  Right L4-5 Microdiscectomy   LUMBAR WOUND DEBRIDEMENT N/A 09/15/2021   Procedure: revision of thoracic incision, Irrigation and debridement of battery pocket wound;  Surgeon: Tia AlertJones, David S, MD;  [facility];  Service: Neurosurgery;  Laterality: N/A;   MAXIMUM ACCESS (MAS)POSTERIOR LUMBAR INTERBODY FUSION (PLIF) 1 LEVEL N/A 04/15/2015   Procedure: Maximum Access Surgery Posterior Lumbar Interbody Fusion Lumbar two-Lumbar three extension of hardware;  Surgeon: Tia Alertavid S Jones, MD;  Location: [facility];  Service: Neurosurgery;  Laterality: N/A;   PATCH ANGIOPLASTY Right 02/07/2020   Procedure: PATCH ANGIOPLASTY USING Livia SnellenXENOSURE BIOLOGIC PATCH;  Surgeon: Nada LibmanBrabham, Vance W, MD;  Location: [facility] OR;  Service: Vascular;  Laterality: Right;   ROTATOR CUFF REPAIR     right shoulder - 3 times   SPINAL CORD STIMULATOR INSERTION  08/11/2021   TONSILLECTOMY  as child   TRANSURETHRAL RESECTION OF BLADDER TUMOR N/A 02/24/2017   Procedure: TRANSURETHRAL RESECTION OF BLADDER TUMOR (TURBT);  Surgeon: Sebastian Acheheodore Manny, MD;  Location: [facility] ORS;  Service: Urology;  Laterality: N/A;   TRANSURETHRAL RESECTION OF PROSTATE N/A 11/20/2020   Procedure: TRANSURETHRAL RESECTION OF THE PROSTATE (TURP);  Surgeon: Sebastian AcheManny, Theodore,  MD;  [facility];  Service: Urology;  Laterality: N/A;   FAMILY HISTORY Family History  Problem Relation Age of Onset   Stroke Mother    Cancer - Prostate Father    SOCIAL HISTORY Social History   Tobacco Use   Smoking status: Former    Packs/day: 3.00    Years: 5.00    Additional pack years: 0.00    Total pack years: 15.00    Types: Cigarettes    Quit date: 11/12/1963    Years since quitting: 59.4   Smokeless tobacco: Never  Vaping  Use   Vaping Use: Never used  Substance Use Topics   Alcohol use: No   Drug use: No       OPHTHALMIC EXAM:  Base Eye Exam     Visual Acuity (Snellen - Linear)       Right Left   Dist cc HM 20/400 +1   Dist ph cc  20/150 +1         Tonometry (Tonopen, 12:47 PM)       Right Left   Pressure 19 18         Pupils       Dark Light Shape React APD   Right 3 2 Round Slow None   Left 3 2 Round Brisk None         Visual Fields (Counting fingers)       Left Right    Full    Restrictions  Total superior temporal, inferior temporal, superior nasal, inferior nasal deficiencies         Extraocular Movement       Right Left    Full, Ortho Full, Ortho         Neuro/Psych     Oriented x3: Yes   Mood/Affect: Normal         Dilation     Both eyes: 1.0% Mydriacyl, 2.5% Phenylephrine @ 12:46 PM           Slit Lamp and Fundus Exam     Slit Lamp Exam       Right Left   Lids/Lashes Dermatochalasis - upper lid Dermatochalasis - upper lid   Conjunctiva/Sclera White and quiet White and quiet   Cornea 1+ Punctate epithelial erosions 1+ Punctate epithelial erosions   Anterior Chamber deep and clear deep and clear   Iris Round and dilated Round and dilated   Lens PC IOL in good position PC IOL in good position   Anterior Vitreous Vitreous syneresis mild syneresis         Fundus Exam       Right Left   Disc 2-3+ Pallor, PPA trace Pallor, sharp rim, PPA   C/D Ratio 0.3 0.3   Macula  Blunted foveal reflex, prominent central PED, RPE mottling and clumping, no heme Flat, Blunted foveal reflex, central GA with RPE mottling and clumping, central drusen, no heme or edema   Vessels attenuated, mild tortuosity attenuated, mild tortuosity   Periphery Attached, No heme Attached, No heme           Refraction     Wearing Rx       Sphere Cylinder Axis Add   Right +1.25 +1.25 177 +2.75   Left -0.25 +1.00 004 +2.75            IMAGING AND PROCEDURES  Imaging and Procedures for 03/27/2023  OCT, Retina - OU - Both Eyes       Right Eye Quality was good. Central Foveal Thickness: 536. Progression has been stable. Findings include no SRF, abnormal foveal contour, retinal drusen , intraretinal fluid, pigment epithelial detachment, outer retinal atrophy (Large central PED with cystic changes overlying, +VMA -- all stable from prior)).   Left Eye Quality was good. Central Foveal Thickness: 246. Progression has been stable. Findings include no IRF, no SRF, abnormal foveal contour, retinal drusen , subretinal hyper-reflective material, pigment epithelial detachment, outer retinal atrophy (Central ORA and low SRHM, trace cystic changes -- stable from prior).   Notes *Images captured and stored on drive  Diagnosis / Impression:  OD: Large central PED with cystic changes overlying, +VMA -- all stable from prior) OS: Central ORA and low SRHM, trace cystic changes -- stable from prior  Clinical management:  See below  Abbreviations: NFP - Normal foveal profile. CME - cystoid macular edema. PED - pigment epithelial detachment. IRF - intraretinal fluid. SRF - subretinal fluid. EZ - ellipsoid zone. ERM - epiretinal membrane. ORA - outer retinal atrophy. ORT - outer retinal tubulation. SRHM - subretinal hyper-reflective material. IRHM - intraretinal hyper-reflective material           ASSESSMENT/PLAN:   ICD-10-CM   1. 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 Essential hypertension  I10     3. Hypertensive retinopathy of both eyes  H35.033     4. Pseudophakia, both eyes  Z96.1      Exudative age related macular degeneration, both eyes - Jay Tran pt presents acutely today for decreased vision  - last visit w/ Jay Tran 2.29.24 -- no injections, BCVA OD HM, OS 20/300 - last injection (06.19.23 -- Vabysmo -- [name]) - today, BCVA OD HM (stable), OS 20/150 (improved)  - OCT OD: Large central PED with cystic changes overlying, +VMA -- all stable from prior); OS: Central ORA and low SRHM, trace cystic changes -- stable from prior  - discussed findings and variable nature of vision in macular degeneration  - reassured pt of no new treatable changes on retinal exam and imaging - no retinal or ophthalmic interventions indicated or recommended   - f/u with [name] as scheduled  2,3. Hypertensive retinopathy OU - discussed importance of tight BP control - monitor  4. Pseudophakia OU  - s/p CE/IOL OU  - IOL in good position, doing well  - monitor  Ophthalmic Meds Ordered this visit:  No orders of the defined types were placed in this encounter.    Return for f/u as schedule with [name].  There are no Patient Instructions on file for this visit.  Explained the diagnoses, plan, and follow up with the patient and they expressed understanding.  Patient expressed understanding of the importance of proper follow up care.   This document serves as a record of services personally performed by Karie Chimera, MD, PhD. It was created on their behalf by Glee Arvin. Manson Passey, OA an ophthalmic technician. The creation of this record is the provider's dictation and/or activities during the visit.    Electronically signed by: Glee Arvin. Kristopher Oppenheim 04.08.2024 4:39 PM  Karie Chimera, M.D., Ph.D. Diseases & Surgery of the Retina and Vitreous [facility] 03/27/2023  I have reviewed the above documentation for accuracy and completeness, and I agree with the above. Karie Chimera, M.D., Ph.D. 03/27/23 4:44 PM   Abbreviations: M myopia (nearsighted); A astigmatism; H hyperopia (farsighted); P presbyopia; Mrx spectacle prescription;  CTL contact lenses; OD right eye; OS left eye; OU both eyes  XT exotropia; ET esotropia; PEK punctate epithelial keratitis; PEE punctate epithelial erosions; DES dry eye syndrome; MGD meibomian gland dysfunction; ATs artificial tears; PFAT's preservative free artificial tears; NSC nuclear sclerotic cataract; PSC posterior subcapsular cataract; ERM epi-retinal membrane; PVD posterior vitreous detachment; RD retinal detachment; DM diabetes mellitus; DR diabetic retinopathy; NPDR non-proliferative diabetic retinopathy; PDR proliferative diabetic retinopathy; CSME clinically significant macular edema; DME diabetic macular edema; dbh dot blot hemorrhages; CWS cotton wool spot; POAG primary open angle  glaucoma; C/D cup-to-disc ratio; HVF humphrey visual field; GVF goldmann visual field; OCT optical coherence tomography; IOP intraocular pressure; BRVO Branch retinal vein occlusion; CRVO central retinal vein occlusion; CRAO central retinal artery occlusion; BRAO branch retinal artery occlusion; RT retinal tear; SB scleral buckle; PPV pars plana vitrectomy; VH Vitreous hemorrhage; PRP panretinal laser photocoagulation; IVK intravitreal kenalog; VMT vitreomacular traction; MH Macular hole;  NVD neovascularization of the disc; NVE neovascularization elsewhere; AREDS age related eye disease study; ARMD age related macular degeneration; POAG primary open angle glaucoma; EBMD epithelial/anterior basement membrane dystrophy; ACIOL anterior chamber intraocular lens; IOL intraocular lens; PCIOL posterior chamber intraocular lens; Phaco/IOL phacoemulsification with intraocular lens placement; PRK photorefractive keratectomy; LASIK laser assisted  in situ keratomileusis; HTN hypertension; DM diabetes mellitus; COPD chronic obstructive pulmonary disease

## 2023-03-31 ENCOUNTER — Encounter (HOSPITAL_COMMUNITY): Payer: Self-pay

## 2023-03-31 ENCOUNTER — Emergency Department (HOSPITAL_COMMUNITY): Payer: Medicare Other

## 2023-03-31 ENCOUNTER — Inpatient Hospital Stay (HOSPITAL_COMMUNITY)
Admission: EM | Admit: 2023-03-31 | Discharge: 2023-04-06 | DRG: 871 | Disposition: A | Discharge status: Skilled Nursing Facility | Payer: Medicare Other | Attending: Internal Medicine | Admitting: Internal Medicine

## 2023-03-31 ENCOUNTER — Other Ambulatory Visit: Payer: Self-pay

## 2023-03-31 DIAGNOSIS — R338 Other retention of urine: Secondary | ICD-10-CM | POA: Diagnosis present

## 2023-03-31 DIAGNOSIS — I10 Essential (primary) hypertension: Secondary | ICD-10-CM | POA: Diagnosis present

## 2023-03-31 DIAGNOSIS — G9341 Metabolic encephalopathy: Secondary | ICD-10-CM | POA: Diagnosis present

## 2023-03-31 DIAGNOSIS — R410 Disorientation, unspecified: Secondary | ICD-10-CM | POA: Diagnosis not present

## 2023-03-31 DIAGNOSIS — Z823 Family history of stroke: Secondary | ICD-10-CM

## 2023-03-31 DIAGNOSIS — R339 Retention of urine, unspecified: Secondary | ICD-10-CM

## 2023-03-31 DIAGNOSIS — K5730 Diverticulosis of large intestine without perforation or abscess without bleeding: Secondary | ICD-10-CM | POA: Diagnosis present

## 2023-03-31 DIAGNOSIS — E869 Volume depletion, unspecified: Secondary | ICD-10-CM | POA: Diagnosis present

## 2023-03-31 DIAGNOSIS — Z7982 Long term (current) use of aspirin: Secondary | ICD-10-CM

## 2023-03-31 DIAGNOSIS — K603 Anal fistula: Secondary | ICD-10-CM

## 2023-03-31 DIAGNOSIS — R6520 Severe sepsis without septic shock: Secondary | ICD-10-CM | POA: Diagnosis present

## 2023-03-31 DIAGNOSIS — E785 Hyperlipidemia, unspecified: Secondary | ICD-10-CM | POA: Diagnosis present

## 2023-03-31 DIAGNOSIS — H353110 Nonexudative age-related macular degeneration, right eye, stage unspecified: Secondary | ICD-10-CM | POA: Diagnosis present

## 2023-03-31 DIAGNOSIS — M549 Dorsalgia, unspecified: Secondary | ICD-10-CM | POA: Diagnosis present

## 2023-03-31 DIAGNOSIS — K610 Anal abscess: Secondary | ICD-10-CM

## 2023-03-31 DIAGNOSIS — T83511A Infection and inflammatory reaction due to indwelling urethral catheter, initial encounter: Secondary | ICD-10-CM | POA: Diagnosis not present

## 2023-03-31 DIAGNOSIS — A419 Sepsis, unspecified organism: Secondary | ICD-10-CM | POA: Diagnosis not present

## 2023-03-31 DIAGNOSIS — M47817 Spondylosis without myelopathy or radiculopathy, lumbosacral region: Secondary | ICD-10-CM | POA: Diagnosis present

## 2023-03-31 DIAGNOSIS — Z66 Do not resuscitate: Secondary | ICD-10-CM | POA: Diagnosis present

## 2023-03-31 DIAGNOSIS — Z87440 Personal history of urinary (tract) infections: Secondary | ICD-10-CM

## 2023-03-31 DIAGNOSIS — N3000 Acute cystitis without hematuria: Secondary | ICD-10-CM | POA: Diagnosis present

## 2023-03-31 DIAGNOSIS — Z884 Allergy status to anesthetic agent status: Secondary | ICD-10-CM

## 2023-03-31 DIAGNOSIS — K219 Gastro-esophageal reflux disease without esophagitis: Secondary | ICD-10-CM | POA: Diagnosis present

## 2023-03-31 DIAGNOSIS — G9340 Encephalopathy, unspecified: Principal | ICD-10-CM

## 2023-03-31 DIAGNOSIS — Z9079 Acquired absence of other genital organ(s): Secondary | ICD-10-CM

## 2023-03-31 DIAGNOSIS — L0231 Cutaneous abscess of buttock: Secondary | ICD-10-CM | POA: Diagnosis present

## 2023-03-31 DIAGNOSIS — K612 Anorectal abscess: Secondary | ICD-10-CM | POA: Diagnosis present

## 2023-03-31 DIAGNOSIS — I119 Hypertensive heart disease without heart failure: Secondary | ICD-10-CM | POA: Diagnosis present

## 2023-03-31 DIAGNOSIS — Z885 Allergy status to narcotic agent status: Secondary | ICD-10-CM

## 2023-03-31 DIAGNOSIS — Z981 Arthrodesis status: Secondary | ICD-10-CM

## 2023-03-31 DIAGNOSIS — E538 Deficiency of other specified B group vitamins: Secondary | ICD-10-CM | POA: Diagnosis present

## 2023-03-31 DIAGNOSIS — N400 Enlarged prostate without lower urinary tract symptoms: Secondary | ICD-10-CM | POA: Diagnosis present

## 2023-03-31 DIAGNOSIS — N401 Enlarged prostate with lower urinary tract symptoms: Secondary | ICD-10-CM | POA: Insufficient documentation

## 2023-03-31 DIAGNOSIS — Z8673 Personal history of transient ischemic attack (TIA), and cerebral infarction without residual deficits: Secondary | ICD-10-CM

## 2023-03-31 DIAGNOSIS — N179 Acute kidney failure, unspecified: Secondary | ICD-10-CM | POA: Insufficient documentation

## 2023-03-31 DIAGNOSIS — Z1152 Encounter for screening for COVID-19: Secondary | ICD-10-CM

## 2023-03-31 DIAGNOSIS — B9620 Unspecified Escherichia coli [E. coli] as the cause of diseases classified elsewhere: Secondary | ICD-10-CM | POA: Diagnosis present

## 2023-03-31 DIAGNOSIS — D638 Anemia in other chronic diseases classified elsewhere: Secondary | ICD-10-CM | POA: Diagnosis present

## 2023-03-31 DIAGNOSIS — Z86718 Personal history of other venous thrombosis and embolism: Secondary | ICD-10-CM

## 2023-03-31 DIAGNOSIS — M1990 Unspecified osteoarthritis, unspecified site: Secondary | ICD-10-CM | POA: Diagnosis present

## 2023-03-31 DIAGNOSIS — G8929 Other chronic pain: Secondary | ICD-10-CM | POA: Diagnosis present

## 2023-03-31 DIAGNOSIS — Z751 Person awaiting admission to adequate facility elsewhere: Secondary | ICD-10-CM

## 2023-03-31 DIAGNOSIS — Z79899 Other long term (current) drug therapy: Secondary | ICD-10-CM

## 2023-03-31 DIAGNOSIS — R531 Weakness: Secondary | ICD-10-CM | POA: Diagnosis not present

## 2023-03-31 DIAGNOSIS — M7918 Myalgia, other site: Secondary | ICD-10-CM | POA: Diagnosis not present

## 2023-03-31 DIAGNOSIS — E871 Hypo-osmolality and hyponatremia: Secondary | ICD-10-CM

## 2023-03-31 DIAGNOSIS — G928 Other toxic encephalopathy: Secondary | ICD-10-CM | POA: Diagnosis present

## 2023-03-31 DIAGNOSIS — Z87891 Personal history of nicotine dependence: Secondary | ICD-10-CM

## 2023-03-31 DIAGNOSIS — M4807 Spinal stenosis, lumbosacral region: Secondary | ICD-10-CM | POA: Diagnosis present

## 2023-03-31 DIAGNOSIS — N390 Urinary tract infection, site not specified: Secondary | ICD-10-CM | POA: Diagnosis present

## 2023-03-31 DIAGNOSIS — M4805 Spinal stenosis, thoracolumbar region: Secondary | ICD-10-CM | POA: Diagnosis present

## 2023-03-31 LAB — CBC
HCT: 41.8 % (ref 39.0–52.0)
Hemoglobin: 14.3 g/dL (ref 13.0–17.0)
MCH: 33.3 pg (ref 26.0–34.0)
MCHC: 34.2 g/dL (ref 30.0–36.0)
MCV: 97.2 fL (ref 80.0–100.0)
Platelets: 343 10*3/uL (ref 150–400)
RBC: 4.3 MIL/uL (ref 4.22–5.81)
RDW: 13.5 % (ref 11.5–15.5)
WBC: 18.3 10*3/uL — ABNORMAL HIGH (ref 4.0–10.5)
nRBC: 0 % (ref 0.0–0.2)

## 2023-03-31 LAB — URINALYSIS, ROUTINE W REFLEX MICROSCOPIC
Bilirubin Urine: NEGATIVE
Glucose, UA: NEGATIVE mg/dL
Hgb urine dipstick: NEGATIVE
Ketones, ur: 5 mg/dL — AB
Leukocytes,Ua: NEGATIVE
Nitrite: NEGATIVE
Protein, ur: NEGATIVE mg/dL
Specific Gravity, Urine: 1.011 (ref 1.005–1.030)
pH: 6 (ref 5.0–8.0)

## 2023-03-31 LAB — BASIC METABOLIC PANEL
Anion gap: 15 (ref 5–15)
BUN: 8 mg/dL (ref 8–23)
CO2: 19 mmol/L — ABNORMAL LOW (ref 22–32)
Calcium: 9.8 mg/dL (ref 8.9–10.3)
Chloride: 98 mmol/L (ref 98–111)
Creatinine, Ser: 0.9 mg/dL (ref 0.61–1.24)
GFR, Estimated: >60 mL/min (ref >60)
Glucose, Bld: 128 mg/dL — ABNORMAL HIGH (ref 70–99)
Potassium: 3.8 mmol/L (ref 3.5–5.1)
Sodium: 132 mmol/L — ABNORMAL LOW (ref 135–145)

## 2023-03-31 LAB — RESP PANEL BY RT-PCR (RSV, FLU A&B, COVID)  RVPGX2
Influenza A by PCR: NEGATIVE
Influenza B by PCR: NEGATIVE
Resp Syncytial Virus by PCR: NEGATIVE
SARS Coronavirus 2 by RT PCR: NEGATIVE

## 2023-03-31 LAB — HEPATIC FUNCTION PANEL
ALT: <5 U/L (ref 0–44)
AST: 19 U/L (ref 15–41)
Albumin: 4.3 g/dL (ref 3.5–5.0)
Alkaline Phosphatase: 55 U/L (ref 38–126)
Bilirubin, Direct: 0.3 mg/dL — ABNORMAL HIGH (ref 0.0–0.2)
Indirect Bilirubin: 1.1 mg/dL — ABNORMAL HIGH (ref 0.3–0.9)
Total Bilirubin: 1.4 mg/dL — ABNORMAL HIGH (ref 0.3–1.2)
Total Protein: 7.8 g/dL (ref 6.5–8.1)

## 2023-03-31 LAB — CBG MONITORING, ED: Glucose-Capillary: 148 mg/dL — ABNORMAL HIGH (ref 70–99)

## 2023-03-31 LAB — TROPONIN I (HIGH SENSITIVITY): Troponin I (High Sensitivity): 12 ng/L (ref <18)

## 2023-03-31 NOTE — ED Notes (Signed)
Pt was not able to urinate but still need UA sample

## 2023-03-31 NOTE — Discharge Instructions (Signed)
Follow-up with urologist.  Continue antibiotics.   Home Instructions Following Incision and Drainage of Perirectal Abscess  Wound care - A dressing has been applied to control any bleeding or drainage immediately after your procedure.  You may remove this dressing at your first bowel movement or tomorrow morning, whichever comes first.  You do not need to repack the area.  After the dressing is removed, clean the area gently with a mild soap and warm water and place a piece of 100% cotton over the area.  Change to cotton every 1-3 hours while awake to keep the area clean and dry.   - you may sit in a tub of warm water for 15-20 minutes at least twice a day and after bowel movements (Sitz baths).  This will help with healing, pain and discomfort. - A small amount of bleeding is to be expected.  If you notice an increase in the bleeding, place a large piece of cotton (about the size of a golf ball) next to the anal opening and sit on a hard surface for 15 minutes.  If the bleeding persists or if you are concerned, please call the office.  Do not sit on rubber rings.  Instead, sit on a soft pillow.   - you have a drain in place to help the area heal. This will be removed at your follow up appointment  Diet -Eat a regular diet.  Avoid foods that may constipate you or give you diarrhea.  Drink 6-8 glasses of water a day and avoid seeds, nuts and popcorn until the area heals.  Medication -Take pain medication as directed.  Do not drive or operate machinery if you are taking a prescription pain medication.   - We recommend Extra Strength Tylenol for mild to moderate pain.  This can be taken as instructed on the bottle.   - If you are given a prescription for antibiotics, take as instructed by your doctor until the entire course is completed  Bowel Habits Avoid laxatives unless instructed by your doctor. Take a fiber supplement twice a day (Metamucil, FiberCon, Benefiber) Avoid excessive straining to  have a bowel movement Do not go for more than 3 days without a bowel movement.  Take a regular Fleet enema if you are constipated.  Call the office if unable to do this or no results.    Activity Resume activities as tolerated beginning tomorrow.  Avoid strenuous activities or sports for one week.    Call the office if you have any questions.  Call IMMEDIATELY if you should develop persistent heavy rectal bleeding, increase in pain, difficulty urinating or fever greater than 100 F.

## 2023-03-31 NOTE — ED Notes (Signed)
Attempted to assist pt to wheelchair for d/c and pt unable to assist self to side of bed. Pt assisted to side of bed with two staff members and reported he was unable to hold himself up. Pt and wife express concern about pt going home with this amount of weakness. MD to bedside to reassess.

## 2023-03-31 NOTE — ED Triage Notes (Signed)
Pt BIB [facility] from home c/o generalized weakness x1 week that has gradually gotten worse. Per EMS pt was hypertensive at 224/98.

## 2023-03-31 NOTE — ED Provider Notes (Addendum)
[place] EMERGENCY DEPARTMENT AT [facility] Provider Note   CSN: [id number] Arrival date & time: 03/31/23  1701     History  Chief Complaint  Patient presents with   Weakness    Jay Tran is a 89 y.o. male.  Patient here with wife.  He is here with generalized weakness.  He is currently on Bactrim for antibiotic for UTI as diagnosed 2 days ago at primary care doctor's office.  He was just discharged from rehab facility after he was hospitalized with a UTI.  He has been using a walker to get around but today has been having a hard time with that.  He has not had any other falls.  He has had some issues with urinary retention in the past but wife thinks that he has been urinating okay.  Blood pressure has been a little bit higher today.  He denies any chest pain or shortness of breath or abdominal pain or nausea or vomiting.  Wife is worried that urine infections making him sicker.  He has had temperature of 99 at home but nothing over 100.4.  Tylenol was last given this morning.  The history is provided by the patient and a caregiver.       Home Medications Prior to Admission medications   Medication Sig Start Date End Date Taking? Authorizing Provider  acetaminophen (TYLENOL) 500 MG tablet Take 1-2 tablets (500-1,000 mg total) by mouth every 6 (six) hours as needed for mild pain, headache or moderate pain. 02/26/23   Hongalgi, Maximino Greenland, MD  aspirin EC 81 MG tablet Take 81 mg by mouth in the morning. Swallow whole.    [provider]  atorvastatin (LIPITOR) 40 MG tablet TAKE 1 TABLET BY MOUTH ONCE DAILY AT  6  PM Patient taking differently: Take 40 mg by mouth at bedtime. 03/16/21   Ihor Austin, NP  finasteride (PROSCAR) 5 MG tablet Take 5 mg by mouth at bedtime.     [provider]  Liniments (SALONPAS PAIN RELIEF PATCH EX) Place 1 patch onto the skin daily as needed (to affected area for pain).     [provider]  lisinopril  (PRINIVIL,ZESTRIL) 20 MG tablet Take 20 mg by mouth at bedtime.     [provider]  Multiple Vitamins-Minerals (PRESERVISION AREDS 2 PO) Take 1 capsule by mouth 2 (two) times daily.     [provider]  Omega-3 Fatty Acids (FISH OIL) 1200 MG CAPS Take 1,200 mg by mouth daily with breakfast.     [provider]  ondansetron (ZOFRAN) 8 MG tablet Take 8 mg by mouth every 8 (eight) hours as needed for nausea or vomiting.    [provider]  SYSTANE 0.4-0.3 % SOLN Place 1 drop into both eyes 3 (three) times daily as needed (for dryness).    [provider]  Tamsulosin HCl (FLOMAX) 0.4 MG CAPS Take 0.4 mg by mouth at bedtime.     [provider]  tiZANidine (ZANAFLEX) 4 MG tablet Take 1 tablet (4 mg total) by mouth every 8 (eight) hours as needed for muscle spasms. Patient taking differently: Take 4-8 mg by mouth at bedtime. 08/11/18   Tia Alert, MD  Turmeric (QC TUMERIC COMPLEX PO) Take 500 mg by mouth daily with breakfast.    [provider]      Allergies    Codeine, Other, and Tramadol    Review of Systems   Review of Systems  Physical Exam  Updated Vital Signs BP (!) 147/79   Pulse 97   Temp 99.4 F (37.4 C) (Oral)   Resp 20   Ht 5\' 10"  (1.778 m)   Wt 86.2 kg   SpO2 92%   BMI 27.26 kg/m  Physical Exam Vitals and nursing note reviewed.  Constitutional:      General: He is not in acute distress.    Appearance: He is well-developed. He is not ill-appearing.  HENT:     Head: Normocephalic and atraumatic.     Nose: Nose normal.     Mouth/Throat:     Mouth: Mucous membranes are moist.  Eyes:     Extraocular Movements: Extraocular movements intact.     Conjunctiva/sclera: Conjunctivae normal.     Pupils: Pupils are equal, round, and reactive to light.  Cardiovascular:     Rate and Rhythm: Normal rate and regular rhythm.     Pulses: Normal pulses.     Heart sounds: Normal heart sounds. No murmur heard. Pulmonary:      Effort: Pulmonary effort is normal. No respiratory distress.     Breath sounds: Normal breath sounds.  Abdominal:     Palpations: Abdomen is soft.     Tenderness: There is no abdominal tenderness.  Musculoskeletal:        General: No swelling.     Cervical back: Normal range of motion and neck supple.  Skin:    General: Skin is warm and dry.     Capillary Refill: Capillary refill takes less than 2 seconds.  Neurological:     General: No focal deficit present.     Mental Status: He is alert and oriented to person, place, and time.     Cranial Nerves: No cranial nerve deficit.     Sensory: No sensory deficit.     Motor: No weakness.     Coordination: Coordination normal.  Psychiatric:        Mood and Affect: Mood normal.     ED Results / Procedures / Treatments   Labs (all labs ordered are listed, but only abnormal results are displayed) Labs Reviewed  BASIC METABOLIC PANEL - Abnormal; Notable for the following components:      Result Value   Sodium 132 (*)    CO2 19 (*)    Glucose, Bld 128 (*)    All other components within normal limits  CBC - Abnormal; Notable for the following components:   WBC 18.3 (*)    All other components within normal limits  URINALYSIS, ROUTINE W REFLEX MICROSCOPIC - Abnormal; Notable for the following components:   Ketones, ur 5 (*)    All other components within normal limits  HEPATIC FUNCTION PANEL - Abnormal; Notable for the following components:   Total Bilirubin 1.4 (*)    Bilirubin, Direct 0.3 (*)    Indirect Bilirubin 1.1 (*)    All other components within normal limits  CBG MONITORING, ED - Abnormal; Notable for the following components:   Glucose-Capillary 148 (*)    All other components within normal limits  RESP PANEL BY RT-PCR (RSV, FLU A&B, COVID)  [id number]  URINE CULTURE  TROPONIN I (HIGH SENSITIVITY)    EKG EKG Interpretation  Date/Time:  Friday March 31 2023 19:43:23 EDT Ventricular Rate:  102 PR  Interval:  207 QRS Duration: 99 QT Interval:  349 QTC Calculation: 455 R Axis:   -21 Text Interpretation: Fast sinus arrhythmia Borderline prolonged PR interval Borderline left axis deviation Low voltage, precordial leads Borderline T  abnormalities, anterior leads Confirmed by Virgina Norfolk [id number]) on 03/31/2023 7:44:47 PM  Radiology CT Head Wo Contrast  Result Date: 03/31/2023 CLINICAL DATA:  Elevated blood pressure.  Generalized weakness. EXAM: CT HEAD WITHOUT CONTRAST TECHNIQUE: Contiguous axial images were obtained from the base of the skull through the vertex without intravenous contrast. RADIATION DOSE REDUCTION: This exam was performed according to the departmental dose-optimization program which includes automated exposure control, adjustment of the mA and/or kV according to patient size and/or use of iterative reconstruction technique. COMPARISON:  02/22/2023 FINDINGS: Brain: There is no evidence for acute hemorrhage, hydrocephalus, mass lesion, or abnormal extra-axial fluid collection. No definite CT evidence for acute infarction. Diffuse loss of parenchymal volume is consistent with atrophy. Patchy low attenuation in the deep hemispheric and periventricular white matter is nonspecific, but likely reflects chronic microvascular ischemic demyelination. Vascular: No hyperdense vessel or unexpected calcification. Skull: No evidence for fracture. No worrisome lytic or sclerotic lesion. Sinuses/Orbits: The visualized paranasal sinuses and mastoid air cells are clear. Visualized portions of the globes and intraorbital fat are unremarkable. Other: None. IMPRESSION: 1. No acute intracranial abnormality. 2. Atrophy with chronic small vessel ischemic disease. Electronically Signed   By: Kennith Center M.D.   On: 03/31/2023 20:31   DG Chest Portable 1 View  Result Date: 03/31/2023 CLINICAL DATA:  weak EXAM: PORTABLE CHEST - 1 VIEW COMPARISON:  02/22/2023. FINDINGS: Cardiac silhouette enlarged. No evidence  of pneumothorax or pleural effusion. No evidence of pulmonary edema. Aorta is calcified. Lower thoracic spine stimulation wires are noted. IMPRESSION: Enlarged cardiac silhouette. Electronically Signed   By: Layla Maw M.D.   On: 03/31/2023 18:14    Procedures Procedures    Medications Ordered in ED Medications - No data to display  ED Course/ Medical Decision Making/ A&P                             Medical Decision Making Amount and/or Complexity of Data Reviewed Labs: ordered. Radiology: ordered.   Excell Seltzer Cohill is here with generalized weakness.  History of hypertension, recent hospital stay for UTI and rehab stay.  Patient arrives hypertensive otherwise normal vitals.  He is well-appearing.  Neurologically he is intact.  He is currently on Bactrim from UTI.  He was started on antibiotics 2 days ago.  Wife does not think he had a temperature over 100.4.  He has been a little bit more weak today than he has been the last few days.  Having a harder time getting around with his walker.  Overall reassuring vitals here except for hypertension.  Will pursue infectious workup with CBC, CMP, urinalysis and chest x-ray.  Will do a COVID test.  Will also do cardiac workup with an EKG and troponin but he is not having any chest pain.  Will get head CT as well.  Bladder scan was performed that showed about 500 cc of urine in his bladder.  He has been retaining again.  Foley catheter was placed.  Urinalysis is clear of infection but he has been on Bactrim for the last 2 days.  COVID and flu test are negative.  He has a white count of 18.  Otherwise lab works unremarkable.  Troponin normal.  EKG shows sinus arrhythmia with no ischemic changes.  Doubt ACS.  Head CT with no head bleed or other acute finding per radiology report.  Chest x-ray showed no evidence of pneumonia.  Patient still really uneasy on  his feet when trying to ambulate him.  He generally just seems weak.  His neuroexam is pretty  unremarkable.  He is blind in the right eye.  He is got chronic pain issues in the left arm but otherwise his strength and sensation and speech and visual acuity are intact.  I do not have any concern that there is been a stroke.  Ultimately I think he is having some decompensation since his recent hospital stay.  He is in rehab he has been home for 2 weeks and ever since he was diagnosed with a urinary tract infection 2 days his baseline has declined.  He was able to walk with a walker to his urologist appointment 2 days ago and now today is having a hard time maintaining his gait and ambulation.  I suspect that this could be from his urinary tract infection.  He does have a white count.  Urine today looks unremarkable but he has been on Bactrim for 48 hours.  Family does not feel safe taking him home.  She does not have a home aide.  He does not have a wheelchair at home in the house is not amenable to a wheelchair.  Ultimately will talk with the hospitalist about maybe admitting him to follow-up urine culture and to have hydrated and evaluated by PT.  Patient overall does not meet any inpatient criteria at this time.  Talk to hospitalist.  Ultimately family does not feel safe taking him home.  I will board him in the ED and have him evaluated by PT and have social work and case management reach out to family and see if we can provide some home health services.  This chart was dictated using voice recognition software.  Despite best efforts to proofread,  errors can occur which can change the documentation meaning.    Final Clinical Impression(s) / ED Diagnoses Final diagnoses:  Urinary retention  Weakness  Acute cystitis without hematuria    Rx / DC Orders ED Discharge Orders     None         Virgina Norfolk, DO 03/31/23 2308    Virgina Norfolk, DO 04/01/23 0002

## 2023-03-31 NOTE — ED Provider Triage Note (Signed)
Emergency Medicine Provider Triage Evaluation Note  Jay Tran , a 89 y.o. male  was evaluated in triage.  Pt complains of generalized weakness.  This been ongoing x 1 week and gradually getting worse.  Wife notes the patient was recently treated for UTI and [facility].  Patient was instructed that he had a UTI and to come into the ED. Denies chest pain, shortness of breath, abdominal pain, nausea, vomiting, urinary symptoms.  Wife notes that her blood pressure today was 202 systolically, she called his primary care doctor who told him to take a dose of his hydrochlorothiazide (this was previously discontinued by his primary care provider).  Blood pressure improved to 170 systolically after additional dose of hydrochlorothiazide.  Review of Systems  Positive:  Negative:   Physical Exam  BP (!) 189/99   Pulse 94   Temp 98.3 F (36.8 C) (Oral)   Resp 15   Ht 5\' 10"  (1.778 m)   Wt 86.2 kg   SpO2 100%   BMI 27.26 kg/m  Gen:   Awake, no distress   Resp:  Normal effort  MSK:   Moves extremities without difficulty  Other:    Medical Decision Making  Medically screening exam initiated at 5:37 PM.  Appropriate orders placed.  Jay Tran was informed that the remainder of the evaluation will be completed by another provider, this initial triage assessment does not replace that evaluation, and the importance of remaining in the ED until their evaluation is complete.  Workup initiated   Valin Massie A, PA-C 03/31/23 1739

## 2023-03-31 NOTE — ED Notes (Signed)
Bladder scan 

## 2023-03-31 NOTE — ED Notes (Signed)
Patient readjusted in bed.

## 2023-03-31 NOTE — ED Notes (Signed)
Patient bladder scanned by this tech, result 377 mL

## 2023-04-01 ENCOUNTER — Emergency Department (HOSPITAL_COMMUNITY): Payer: Medicare Other

## 2023-04-01 DIAGNOSIS — G8929 Other chronic pain: Secondary | ICD-10-CM

## 2023-04-01 DIAGNOSIS — T83511A Infection and inflammatory reaction due to indwelling urethral catheter, initial encounter: Secondary | ICD-10-CM | POA: Diagnosis not present

## 2023-04-01 DIAGNOSIS — R531 Weakness: Secondary | ICD-10-CM

## 2023-04-01 DIAGNOSIS — N400 Enlarged prostate without lower urinary tract symptoms: Secondary | ICD-10-CM

## 2023-04-01 DIAGNOSIS — R410 Disorientation, unspecified: Secondary | ICD-10-CM | POA: Insufficient documentation

## 2023-04-01 DIAGNOSIS — N390 Urinary tract infection, site not specified: Secondary | ICD-10-CM

## 2023-04-01 DIAGNOSIS — I10 Essential (primary) hypertension: Secondary | ICD-10-CM

## 2023-04-01 DIAGNOSIS — M545 Low back pain, unspecified: Secondary | ICD-10-CM

## 2023-04-01 LAB — CBC WITH DIFFERENTIAL/PLATELET
Abs Immature Granulocytes: 0.06 10*3/uL (ref 0.00–0.07)
Basophils Absolute: 0.1 10*3/uL (ref 0.0–0.1)
Basophils Relative: 0 %
Eosinophils Absolute: 0 10*3/uL (ref 0.0–0.5)
Eosinophils Relative: 0 %
HCT: 38.8 % — ABNORMAL LOW (ref 39.0–52.0)
Hemoglobin: 13.4 g/dL (ref 13.0–17.0)
Immature Granulocytes: 0 %
Lymphocytes Relative: 6 %
Lymphs Abs: 1 10*3/uL (ref 0.7–4.0)
MCH: 33.4 pg (ref 26.0–34.0)
MCHC: 34.5 g/dL (ref 30.0–36.0)
MCV: 96.8 fL (ref 80.0–100.0)
Monocytes Absolute: 1 10*3/uL (ref 0.1–1.0)
Monocytes Relative: 6 %
Neutro Abs: 14.5 10*3/uL — ABNORMAL HIGH (ref 1.7–7.7)
Neutrophils Relative %: 88 %
Platelets: 307 10*3/uL (ref 150–400)
RBC: 4.01 MIL/uL — ABNORMAL LOW (ref 4.22–5.81)
RDW: 13.4 % (ref 11.5–15.5)
WBC: 16.6 10*3/uL — ABNORMAL HIGH (ref 4.0–10.5)
nRBC: 0 % (ref 0.0–0.2)

## 2023-04-01 LAB — BASIC METABOLIC PANEL
Anion gap: 11 (ref 5–15)
BUN: 11 mg/dL (ref 8–23)
CO2: 25 mmol/L (ref 22–32)
Calcium: 9.8 mg/dL (ref 8.9–10.3)
Chloride: 93 mmol/L — ABNORMAL LOW (ref 98–111)
Creatinine, Ser: 1.05 mg/dL (ref 0.61–1.24)
GFR, Estimated: >60 mL/min (ref >60)
Glucose, Bld: 185 mg/dL — ABNORMAL HIGH (ref 70–99)
Potassium: 4.3 mmol/L (ref 3.5–5.1)
Sodium: 129 mmol/L — ABNORMAL LOW (ref 135–145)

## 2023-04-01 LAB — HEPATIC FUNCTION PANEL
ALT: 6 U/L (ref 0–44)
AST: 22 U/L (ref 15–41)
Albumin: 3.7 g/dL (ref 3.5–5.0)
Alkaline Phosphatase: 52 U/L (ref 38–126)
Bilirubin, Direct: 0.3 mg/dL — ABNORMAL HIGH (ref 0.0–0.2)
Indirect Bilirubin: 1.5 mg/dL — ABNORMAL HIGH (ref 0.3–0.9)
Total Bilirubin: 1.8 mg/dL — ABNORMAL HIGH (ref 0.3–1.2)
Total Protein: 6.9 g/dL (ref 6.5–8.1)

## 2023-04-01 LAB — T4, FREE: Free T4: 1.1 ng/dL (ref 0.61–1.12)

## 2023-04-01 LAB — TSH: TSH: 0.676 u[IU]/mL (ref 0.350–4.500)

## 2023-04-01 MED ORDER — LIDOCAINE 5 % EX PTCH: 1.0000 | MEDICATED_PATCH | Freq: Every day | CUTANEOUS | Status: DC | PRN

## 2023-04-01 MED ORDER — SULFAMETHOXAZOLE-TRIMETHOPRIM 800-160 MG PO TABS
1.0000 | ORAL_TABLET | Freq: Two times a day (BID) | ORAL | Status: DC
  Administered 2023-04-01: 1 via ORAL
  Filled 2023-04-01: qty 1

## 2023-04-01 MED ORDER — ASPIRIN 81 MG PO TBEC
81.0000 mg | DELAYED_RELEASE_TABLET | Freq: Every morning | ORAL | Status: DC
  Administered 2023-04-01 – 2023-04-06 (×6): 81 mg via ORAL
  Filled 2023-04-01 (×6): qty 1

## 2023-04-01 MED ORDER — SULFAMETHOXAZOLE-TRIMETHOPRIM 800-160 MG PO TABS: 1.0000 | ORAL_TABLET | Freq: Every day | ORAL | Status: DC

## 2023-04-01 MED ORDER — LIDOCAINE 5 % EX PTCH
1.0000 | MEDICATED_PATCH | CUTANEOUS | Status: DC
  Administered 2023-04-01 – 2023-04-05 (×5): 1 via TRANSDERMAL
  Filled 2023-04-01 (×5): qty 1

## 2023-04-01 MED ORDER — SODIUM CHLORIDE 0.9 % IV BOLUS
250.0000 mL | Freq: Once | INTRAVENOUS | Status: AC
  Administered 2023-04-01: 250 mL via INTRAVENOUS

## 2023-04-01 MED ORDER — POLYVINYL ALCOHOL 1.4 % OP SOLN
1.0000 [drp] | OPHTHALMIC | Status: DC | PRN
  Filled 2023-04-01 (×2): qty 15

## 2023-04-01 MED ORDER — POLYETHYL GLYCOL-PROPYL GLYCOL 0.4-0.3 % OP SOLN: 1.0000 [drp] | Freq: Three times a day (TID) | OPHTHALMIC | Status: DC | PRN

## 2023-04-01 MED ORDER — LIDOCAINE 5 % EX PTCH
1.0000 | MEDICATED_PATCH | CUTANEOUS | Status: DC
  Administered 2023-04-01: 1 via TRANSDERMAL
  Filled 2023-04-01: qty 1

## 2023-04-01 MED ORDER — ONDANSETRON HCL 4 MG PO TABS: 8.0000 mg | ORAL_TABLET | Freq: Three times a day (TID) | ORAL | Status: DC | PRN

## 2023-04-01 MED ORDER — ATORVASTATIN CALCIUM 40 MG PO TABS
40.0000 mg | ORAL_TABLET | Freq: Every day | ORAL | Status: DC
  Administered 2023-04-01 – 2023-04-05 (×5): 40 mg via ORAL
  Filled 2023-04-01 (×5): qty 1

## 2023-04-01 MED ORDER — LISINOPRIL 20 MG PO TABS
20.0000 mg | ORAL_TABLET | Freq: Every day | ORAL | Status: DC
  Administered 2023-04-01 (×2): 20 mg via ORAL
  Filled 2023-04-01: qty 1
  Filled 2023-04-01: qty 2

## 2023-04-01 MED ORDER — ACETAMINOPHEN 325 MG PO TABS
650.0000 mg | ORAL_TABLET | Freq: Four times a day (QID) | ORAL | Status: DC | PRN
  Administered 2023-04-02 – 2023-04-06 (×9): 650 mg via ORAL
  Filled 2023-04-01 (×10): qty 2

## 2023-04-01 MED ORDER — DIAZEPAM 5 MG/ML IJ SOLN
5.0000 mg | Freq: Once | INTRAMUSCULAR | Status: AC
  Administered 2023-04-01: 5 mg via INTRAVENOUS
  Filled 2023-04-01: qty 2

## 2023-04-01 MED ORDER — ENSURE ENLIVE PO LIQD
237.0000 mL | Freq: Two times a day (BID) | ORAL | Status: DC
  Administered 2023-04-02 – 2023-04-06 (×7): 237 mL via ORAL
  Filled 2023-04-01 (×2): qty 237

## 2023-04-01 MED ORDER — TAMSULOSIN HCL 0.4 MG PO CAPS
0.4000 mg | ORAL_CAPSULE | Freq: Every day | ORAL | Status: DC
  Administered 2023-04-01 – 2023-04-05 (×5): 0.4 mg via ORAL
  Filled 2023-04-01 (×5): qty 1

## 2023-04-01 MED ORDER — LORAZEPAM 0.5 MG PO TABS
0.5000 mg | ORAL_TABLET | ORAL | Status: AC | PRN
  Administered 2023-04-02: 0.5 mg via ORAL
  Filled 2023-04-01: qty 1

## 2023-04-01 MED ORDER — LORAZEPAM 1 MG PO TABS
1.0000 mg | ORAL_TABLET | ORAL | Status: AC | PRN
  Administered 2023-04-01: 1 mg via ORAL
  Filled 2023-04-01: qty 1

## 2023-04-01 MED ORDER — ACETAMINOPHEN 500 MG PO TABS: 500.0000 mg | ORAL_TABLET | Freq: Four times a day (QID) | ORAL | Status: DC | PRN

## 2023-04-01 MED ORDER — FINASTERIDE 5 MG PO TABS
5.0000 mg | ORAL_TABLET | Freq: Every day | ORAL | Status: DC
  Administered 2023-04-01 – 2023-04-05 (×5): 5 mg via ORAL
  Filled 2023-04-01 (×5): qty 1

## 2023-04-01 MED ORDER — SULFAMETHOXAZOLE-TRIMETHOPRIM 800-160 MG PO TABS: 1.0000 | ORAL_TABLET | Freq: Two times a day (BID) | ORAL | Status: DC

## 2023-04-01 MED ORDER — ACETAMINOPHEN 500 MG PO TABS
500.0000 mg | ORAL_TABLET | Freq: Four times a day (QID) | ORAL | Status: DC | PRN
  Administered 2023-04-01: 1000 mg via ORAL
  Filled 2023-04-01: qty 2

## 2023-04-01 NOTE — ED Notes (Signed)
MRI notified about spinal cord stimulator being fully charged. They said it would be around 10 until they would get patient.

## 2023-04-01 NOTE — ED Notes (Signed)
Patient transported to MRI 

## 2023-04-01 NOTE — ED Notes (Signed)
When applying lidocaine patch patient is expressing confusion. Patient is referring to getting supplies out of his bathroom.

## 2023-04-01 NOTE — ED Provider Notes (Addendum)
Emergency Medicine Observation Re-evaluation Note  Jay Tran is a 89 y.o. male, seen on rounds today.  Pt initially presented to the ED for complaints of Weakness Currently, the patient is resting.  Physical Exam  BP (!) 120/104   Pulse 67   Temp 98.1 F (36.7 C) (Oral)   Resp (!) 24   Ht 5\' 10"  (1.778 m)   Wt 86.2 kg   SpO2 94%   BMI 27.26 kg/m  Physical Exam General: NAD Neuro: GCS 15, AAOx3, No cranial nerve deficit, 4/5 strength in LE, 5/5 in upper extremities, intact sensation to light touch Abd: Mildly distended, non-tender to palpation Resp: CTAB CV: Well perfused GU: Foley catheter in place, draining amber urine   ED Course / MDM  EKG:EKG Interpretation  Date/Time:  Friday March 31 2023 19:43:23 EDT Ventricular Rate:  102 PR Interval:  207 QRS Duration: 99 QT Interval:  349 QTC Calculation: 455 R Axis:   -21 Text Interpretation: Fast sinus arrhythmia Borderline prolonged PR interval Borderline left axis deviation Low voltage, precordial leads Borderline T abnormalities, anterior leads Confirmed by Lockie Mola, Adam ([id number]) on 03/31/2023 7:44:47 PM  I have reviewed the labs performed to date as well as medications administered while in observation.  Recent changes in the last 24 hours include pt presented yesterday in urinary retention, foley placed, pt subsequently made a TOC boarder. Pt has been on Bactrim for 3 days for a catheter associated UTI. Has had transient episodes of confusion while in the ER per daughter. Neuro intact, no focal deficits, some bilateral generalized LE weakness, with generalized weakness making standing to get out of bed difficult. Not septic. WBC 17, no fever or tachycardia. Repeat WBC downtrending but elevated to 16.6. Abd soft non tender.   Discussed case with Dr. Alinda Money, on-call hospitalist who reviewed the patient's record, still no indication for admission, will come and provide hospitalist consultation.   Family do state that the  patient has developed mild confusion since arrival to the ER. This could be encephalopathy in the setting of catheter associated UTI vs hospital associated delirium. Pt without clear focal neuro deficit, normal head CT, no meningismus. Could consider broadening the patient to IV ABX in the setting of potential failed outpatient treatment with bactrim. I am concerned the patient's home PRN tylenol for arthritis could be masking a fever. This has been stopped.   Pt has had waxing and waning GCS, family endorse intermittent confusion. On my exam the patient is GCS 15, AAOx3, conversant. On repeat assessment has bilateral weakness, some focality in the LLE. In the setting of leukocytosis, back pain, and urinary retention and generalized LE weakness, considered epidural abscess or discitis, although feel it to be less likely. Considered CVA. Will obtain MRI of the Brain and Lumbar Spine.   CT Head 4/12: IMPRESSION:  1. No acute intracranial abnormality.  2. Atrophy with chronic small vessel ischemic disease.    RUQ [place] due to elevated bilirubin: IMPRESSION:  Cholelithiasis without sonographic evidence of acute cholecystitis.    MRI Brain: Pending  MRI Lumbar Spine: Pending  Plan  Current plan is for placement, SW coordinating. Signout given to Dr. Silverio Lay to follow-up MRI imaging.    Ernie Avena, MD 04/01/23 1535    Ernie Avena, MD 04/01/23 1950

## 2023-04-01 NOTE — Consult Note (Addendum)
Initial Consultation Note   Patient: Jay Tran ZOX:[id number] DOB: 1934/09/28 PCP: Kaleen Mask, MD DOA: 03/31/2023 DOS: the patient was seen and examined on 04/01/2023 Primary service: Default, Provider, MD Emergency Medicine  Referring physician: Dr. Karene Fry Reason for consult: Confusion  Patient coming from: Home/ED  Chief Complaint: Weakness  HPI/course: Jay Tran is a 89 y.o. male with medical history significant of BPH with Foley in place, chronic pain status post lumbar fusion, GERD, hypertension, carotid artery disease, DVT, hyperlipidemia, CVA who presented with generalized weakness.  Patient was recently diagnosed with a UTI outpatient had been on Bactrim for 2 days on arrival 4/12.  Patient recently admitted for UTI and had been discharged to a rehab facility.  He had returned home recently.  Since that time he has been using a walker to get around his home but now has had generalized weakness and it is difficult for him to get around with a walker.  This has occurred since new UTI diagnosis.  Patient reported temperature to 99 at home.  Denies true fevers, chills, chest pain, shortness of breath, abdominal pain, constipation, diarrhea, nausea, vomiting.  In the ED patient has developed some intermittent confusion which has been somewhat progressive.  Unclear etiology suspected hospital associated delirium.  White count has been improving initially elevated to 18.3 and today 16.6.  Lab workup shows mild hyponatremia at 132 yesterday and 129 today, glucose 185 today.  TSH normal, T4 normal.  Troponin negative, COVID-negative, urinalysis with ketones only.  Urine culture pending.  Chest x-ray yesterday showed cardiomegaly but no acute normality.  CT head yesterday showed no acute abnormality.  Patient has been on home meds and p.o. Bactrim in the ED.  Vital signs have been stable, Tmax 100.0, blood pressure 120s to 150s systolic, respirate in the teens to  20s.  Initial consult for admission was overnight 4/12.  Declined at that time it did not meet inpatient criteria and needed PT evaluation and likely placement.  Has been evaluated by PT and followed by TAC working on patient placement.  However has not had qualifying inpatient stay and they may need to pay out-of-pocket initially.  Continues not to meet inpatient criteria at this time however consult requested to follow and assist with medical problems while boarding in the ED with concern for possible deterioration/worsening of UTI symptoms with altered mental status versus hospital associated delirium.  Will consult and consider transitioning to inpatient criteria if this becomes appropriate.  Review of Systems: As per HPI otherwise all other systems reviewed and are negative.  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    Hx: of after 2013 back surgery   GERD (gastroesophageal reflux disease)    Hearing aid worn    Hx: of right ear   Hyperlipemia    Hypertension    Lumbar herniated disc    Lumbar stenosis    Macular degeneration right eye dry   PONV (postoperative nausea and vomiting)    takes iv nausea meds    Seasonal allergies    Hx: of   Stroke     Past Surgical History:  Procedure Laterality Date   BACK SURGERY  2013, x 3 with dr Yetta Barre 2014, 2016 and 2019   x 4, takes back injections monthly   COLONOSCOPY  Hx: of   ENDARTERECTOMY Right 02/07/2020   Procedure: ENDARTERECTOMY CAROTID;  Surgeon: Nada Libman, MD;  Location: [facility] OR;  Service: Vascular;  Laterality: Right;   EYE SURGERY     bil catarcts 02/2016   HERNIA REPAIR  several yrs ago   inguinal   LUMBAR LAMINECTOMY  10/26/2012   Procedure: MICRODISCECTOMY LUMBAR LAMINECTOMY;  Surgeon: Eldred Manges, MD;  Location: [facility] OR;  Service: Orthopedics;  Laterality: Right;   Right L4-5 Microdiscectomy   LUMBAR WOUND DEBRIDEMENT N/A 09/15/2021   Procedure: revision of thoracic incision, Irrigation and debridement of battery pocket wound;  Surgeon: Tia Alert, MD;  Location: [facility] OR;  Service: Neurosurgery;  Laterality: N/A;   MAXIMUM ACCESS (MAS)POSTERIOR LUMBAR INTERBODY FUSION (PLIF) 1 LEVEL N/A 04/15/2015   Procedure: Maximum Access Surgery Posterior Lumbar Interbody Fusion Lumbar two-Lumbar three extension of hardware;  Surgeon: Tia Alert, MD;  Location: [facility] NEURO ORS;  Service: Neurosurgery;  Laterality: N/A;   PATCH ANGIOPLASTY Right 02/07/2020   Procedure: PATCH ANGIOPLASTY USING Livia Snellen BIOLOGIC PATCH;  Surgeon: Nada Libman, MD;  Location: [facility] OR;  Service: Vascular;  Laterality: Right;   ROTATOR CUFF REPAIR     right shoulder - 3 times   SPINAL CORD STIMULATOR INSERTION  08/11/2021   TONSILLECTOMY  as child   TRANSURETHRAL RESECTION OF BLADDER TUMOR N/A 02/24/2017   Procedure: TRANSURETHRAL RESECTION OF BLADDER TUMOR (TURBT);  Surgeon: Sebastian Ache, MD;  Location: [facility] ORS;  Service: Urology;  Laterality: N/A;   TRANSURETHRAL RESECTION OF PROSTATE N/A 11/20/2020   Procedure: TRANSURETHRAL RESECTION OF THE PROSTATE (TURP);  Surgeon: Sebastian Ache, MD;  Location: [facility];  Service: Urology;  Laterality: N/A;    Social History  reports that he quit smoking about 59 years ago. His smoking use included cigarettes. He has a 15.00 pack-year smoking history. He has never used smokeless tobacco. He reports that he does not drink alcohol and does not use drugs.  Allergies  Allergen Reactions   Codeine Itching and Nausea And Vomiting   Other Nausea Only    Anesthesia -  given with medication for nausea    Tramadol Itching and Nausea Only    Family History  Problem Relation Age of Onset   Stroke Mother    Cancer - Prostate Father   Reviewed on admission  Prior to Admission medications   Medication Sig Start Date End Date  Taking? Authorizing Provider  acetaminophen (TYLENOL) 500 MG tablet Take 1-2 tablets (500-1,000 mg total) by mouth every 6 (six) hours as needed for mild pain, headache or moderate pain. 02/26/23   Hongalgi, Maximino Greenland, MD  aspirin EC 81 MG tablet Take 81 mg by mouth in the morning. Swallow whole.    [provider]  atorvastatin (LIPITOR) 40 MG tablet TAKE 1 TABLET BY MOUTH ONCE DAILY AT  6  PM Patient taking differently: Take 40 mg by mouth at bedtime. 03/16/21   Ihor Austin, NP  finasteride (PROSCAR) 5 MG tablet Take 5 mg by mouth at bedtime.     [provider]  Liniments (SALONPAS PAIN RELIEF PATCH EX) Place 1 patch onto the skin daily as needed (to affected area for pain).     [provider]  lisinopril (PRINIVIL,ZESTRIL) 20 MG tablet Take 20 mg by mouth at bedtime.     [provider]  Multiple Vitamins-Minerals (PRESERVISION AREDS 2 PO) Take 1 capsule by mouth 2 (two) times daily.     [provider]  Omega-3 Fatty Acids (FISH OIL) 1200 MG CAPS Take 1,200 mg by mouth daily with breakfast.     [provider]  ondansetron (ZOFRAN) 8 MG tablet Take 8 mg by mouth every 8 (eight) hours as needed for nausea or vomiting.    [provider]  SYSTANE 0.4-0.3 % SOLN Place 1 drop into both eyes 3 (three) times daily as needed (for dryness).    [provider]  Tamsulosin HCl (FLOMAX) 0.4 MG CAPS Take 0.4 mg by mouth at bedtime.     [provider]  tiZANidine (ZANAFLEX) 4 MG tablet Take 1 tablet (4 mg total) by mouth every 8 (eight) hours as needed for muscle spasms. Patient taking differently: Take 4-8 mg by mouth at bedtime. 08/11/18   Tia Alert, MD  Turmeric (QC TUMERIC COMPLEX PO) Take 500 mg by mouth daily with breakfast.    [provider]    Physical Exam: Vitals:   04/01/23 1115 04/01/23 1356 04/01/23 1456 04/01/23 1600  BP: (!) 120/104  (!) 120/48 133/68  Pulse: 67  83 79  Resp: (!) 24  20 20    Temp:  98.4 F (36.9 C)    TempSrc:  Rectal    SpO2: 94%  95% 95%  Weight:      Height:        Physical Exam Constitutional:      General: He is not in acute distress.    Appearance: Normal appearance.  HENT:     Head: Normocephalic and atraumatic.     Mouth/Throat:     Mouth: Mucous membranes are moist.     Pharynx: Oropharynx is clear.  Eyes:     Extraocular Movements: Extraocular movements intact.     Pupils: Pupils are equal, round, and reactive to light.  Cardiovascular:     Rate and Rhythm: Normal rate and regular rhythm.     Pulses: Normal pulses.     Heart sounds: Normal heart sounds.  Pulmonary:     Effort: Pulmonary effort is normal. No respiratory distress.     Breath sounds: Normal breath sounds.  Abdominal:     General: Bowel sounds are normal. There is no distension.     Palpations: Abdomen is soft.     Tenderness: There is no abdominal tenderness.  Musculoskeletal:        General: No swelling or deformity.  Skin:    General: Skin is warm and dry.  Neurological:     General: No focal deficit present.     Comments: Alert and oriented to self and year only at this time.  Not fully oriented to place as he stated first that he was at home then the doctor's office.  Not oriented to situation.  Strength and sensation grossly intact.  Difficult to fully assess left upper extremity due to chronic shoulder pain.  Bilateral lower extremities strength grossly intact on exam, 4-5/5 bilaterally.    Labs on Admission: I have personally reviewed following labs and imaging studies  CBC: Recent Labs  Lab 03/31/23 1745 04/01/23 1355  WBC 18.3* 16.6*  NEUTROABS  --  14.5*  HGB 14.3 13.4  HCT 41.8 38.8*  MCV 97.2 96.8  PLT 343 307    Basic Metabolic Panel: Recent Labs  Lab 03/31/23 1745 04/01/23 1355  NA 132* 129*  K 3.8 4.3  CL 98 93*  CO2 19* 25  GLUCOSE 128* 185*  BUN 8 11  CREATININE 0.90 1.05  CALCIUM 9.8 9.8    GFR:  Estimated Creatinine  Clearance: 52.1 mL/min (by C-G formula based on SCr of 1.05 mg/dL).  Liver Function Tests: Recent Labs  Lab 03/31/23 1745 04/01/23 1355  AST 19 22  ALT <5 6  ALKPHOS 55 52  BILITOT 1.4* 1.8*  PROT 7.8 6.9  ALBUMIN 4.3 3.7    Urine analysis:    Component Value Date/Time   COLORURINE YELLOW 03/31/2023 2042   APPEARANCEUR CLEAR 03/31/2023 2042   LABSPEC 1.011 03/31/2023 2042   PHURINE 6.0 03/31/2023 2042   GLUCOSEU NEGATIVE 03/31/2023 2042   HGBUR NEGATIVE 03/31/2023 2042   BILIRUBINUR NEGATIVE 03/31/2023 2042   KETONESUR 5 (A) 03/31/2023 2042   PROTEINUR NEGATIVE 03/31/2023 2042   UROBILINOGEN 0.2 05/27/2010 1530   NITRITE NEGATIVE 03/31/2023 2042   LEUKOCYTESUR NEGATIVE 03/31/2023 2042    Radiological Exams on Admission: CT Head Wo Contrast  Result Date: 03/31/2023 CLINICAL DATA:  Elevated blood pressure.  Generalized weakness. EXAM: CT HEAD WITHOUT CONTRAST TECHNIQUE: Contiguous axial images were obtained from the base of the skull through the vertex without intravenous contrast. RADIATION DOSE REDUCTION: This exam was performed according to the departmental dose-optimization program which includes automated exposure control, adjustment of the mA and/or kV according to patient size and/or use of iterative reconstruction technique. COMPARISON:  02/22/2023 FINDINGS: Brain: There is no evidence for acute hemorrhage, hydrocephalus, mass lesion, or abnormal extra-axial fluid collection. No definite CT evidence for acute infarction. Diffuse loss of parenchymal volume is consistent with atrophy. Patchy low attenuation in the deep hemispheric and periventricular white matter is nonspecific, but likely reflects chronic microvascular ischemic demyelination. Vascular: No hyperdense vessel or unexpected calcification. Skull: No evidence for fracture. No worrisome lytic or sclerotic lesion. Sinuses/Orbits: The visualized paranasal sinuses and mastoid air cells are clear. Visualized portions of  the globes and intraorbital fat are unremarkable. Other: None. IMPRESSION: 1. No acute intracranial abnormality. 2. Atrophy with chronic small vessel ischemic disease. Electronically Signed   By: Kennith Center M.D.   On: 03/31/2023 20:31   DG Chest Portable 1 View  Result Date: 03/31/2023 CLINICAL DATA:  weak EXAM: PORTABLE CHEST - 1 VIEW COMPARISON:  02/22/2023. FINDINGS: Cardiac silhouette enlarged. No evidence of pneumothorax or pleural effusion. No evidence of pulmonary edema. Aorta is calcified. Lower thoracic spine stimulation wires are noted. IMPRESSION: Enlarged cardiac silhouette. Electronically Signed   By: Layla Maw M.D.   On: 03/31/2023 18:14    EKG: Independently reviewed.  Sinus tachycardia at 102 bpm.  Sinus arrhythmia.  First-degree AV block.  Assessment/Plan Active Problems:   UTI (urinary tract infection)   Essential hypertension   GERD (gastroesophageal reflux disease)   Chronic back pain   Prostatic hypertrophy   Delirium   Weakness   Confusion Delirium > Patient has developed increasing intermittent confusion in the ED.  Remains alert and oriented but at times he stated he was getting things/Supplies out of his own bathroom. > Symptoms onset while in the hospital so suspicious for hospital associated delirium given patient's age of 40. > Will monitor for evidence of worsening infection as he is suspected to have UTI as below.  If he meets criteria for admission we will transition to this; If improving will likely sign off at that time. > Workup in the ED thus far showed mild hyponatremia which she had some during previous admission will address as below.  Normal TSH, normal T4.  Leukocytosis currently improving from 18.3-16.6 as above.  Being treated for UTI with suspected improvement given urinalysis shows ketones only.  Urine culture is pending for completeness.  No evidence of infection on chest x-ray.  Normal CT head. - Continue to monitor in the ED - Delirium  precautions - Consider as needed Zyprexa for agitation/combativeness if develops - Trend fever curve and WBC - Correct electrolytes as below Addendum - Follow-up additional imaging when available as ordered by EDP as below.  EDP will notify us if positive findings and need for transition to inpatient.  Weakness > Recent SNF stay.  Was using walker at home after this.  Worsening weakness since new UTI.  PT now recommending repeat SNF stay. > TOC also consulted and are working to try to place patient however he does not have qualifying inpatient stay after his recent SNF stay and return to home. > Not currently meeting inpatient criteria as above if this changes we will admit as appropriate. - Continue to work with PT and TOC - Supportive care Addendum > Given patient's weakness and confusion above, EDP is broadening workup to include MRI brain and L-spine. > Patient's family has to get charger for his implanted stimulator and charger for an hour before being placed in MRI mode.  They have done this previously for shoulder MRI. - Follow-up results of MRI, recommend appropriate consultations if positive and will reconsider admission at that time  UTI > Diagnosed outpatient.  Unable to obtain urine at urologist office, but sample obtained at family medicine provider's office was reportedly positive for infection but still awaiting outpatient cultures per family.  > Started on Bactrim outpatient 2 days prior to arrival and remains on this. > Urinalysis here showed ketones only so suspect he is improving on Bactrim.  Leukocytosis initially 18.3 on arrival improved to 16.6 today.  Tmax only 100.0.  Thus far, possibly some fevers outpatient though no true fevers were measured he was on scheduled Tylenol at that time. > Monitoring confusion above suspected delirium but will monitor for evidence of this being secondary to infection. - Continue with p.o. Bactrim for now - Trend fever curve and  WBC  Mild hyponatremia > Sodium baseline is 131 - 133, today it is 129.  Recent issues with hyponatremia thought to be due to hydrochlorothiazide which has been discontinued.  TSH and free T4 normal in ED. > Suspect likely some decreased hydration while in the ED.  Has not yet received any IV fluids. - 250 cc NS bolus - Continue to trend renal function and electrolytes  BPH > Foley in place.  May need exchange after treatment for UTI. - Continue home tamsulosin and finasteride  Chronic pain > Spinal cord stimulator in place. - Continue home as needed Tylenol - Continue lidocaine patches  Hypertension - Continue home lisinopril  Family updated at bedside  Appreciate this consultation, TR[facility] will continue to follow along tomorrow to reevaluate patient.  Synetta Fail MD [facility]  How to contact the [facility] Attending or Consulting provider 7A - 7P or covering provider during after hours 7P -7A, for this patient?   Check the care team in [facility] and look for a) attending/consulting TR[facility] provider listed and b) the [facility] team listed Log into [email or web address] and use [place]'s universal password to access. If you do not have the password, please contact the hospital operator. Locate the [facility] provider you are looking for under [facility] and page to a number that you can be directly reached. If you still have difficulty reaching the provider, please page the [facility] (Director on Call) for the Hospitalists  listed on amion for assistance.  04/01/2023, 4:27 PM

## 2023-04-01 NOTE — ED Notes (Signed)
This RN and family applied spinal cord [place] to patient.

## 2023-04-01 NOTE — Evaluation (Signed)
Physical Therapy Evaluation Patient Details Name: Jay Tran MRN: [id number] DOB: [birth year] Today's Date: 04/01/2023  History of Present Illness  Pt is an 89 y.o. male who presented to the ED via EMS for home with primary c/o generalized weakness. He is currently on antibiotic for UTI. Recent hospitalization in March 2024 following a fall at home. Pt d/c'd from that hospital stay to SNF for rehab. PMH: BPH, chronic back pain, OA, HTN, gait instability, GERD, UTI   Clinical Impression  Pt admitted with above diagnosis. PTA pt lived at home with his wife, mod I mobility with RW household distances. Pt currently with functional limitations due to the deficits listed below (see PT Problem List). On eval, pt required max assist bed mobility, and mod assist sit to stand with RW. Unable to progress gait due to weakness. He demonstrated poor sitting and standing balance with posterior lean. Pt with c/o pain bilat hips and shoulders. Pt will benefit from acute skilled PT to increase their independence and safety with mobility to allow discharge.          Recommendations for follow up therapy are one component of a multi-disciplinary discharge planning process, led by the attending physician.  Recommendations may be updated based on patient status, additional functional criteria and insurance authorization.  Follow Up Recommendations Can patient physically be transported by private vehicle: No     Assistance Recommended at Discharge Frequent or constant Supervision/Assistance  Patient can return home with the following  Assist for transportation;A lot of help with walking and/or transfers;A lot of help with bathing/dressing/bathroom;Assistance with cooking/housework;Help with stairs or ramp for entrance    Equipment Recommendations Wheelchair (measurements PT)  Recommendations for Other Services       Functional Status Assessment Patient has had a recent decline in their functional status and  demonstrates the ability to make significant improvements in function in a reasonable and predictable amount of time.     Precautions / Restrictions Precautions Precautions: Fall      Mobility  Bed Mobility Overal bed mobility: Needs Assistance Bed Mobility: Supine to Sit, Sit to Supine     Supine to sit: Max assist, HOB elevated Sit to supine: Max assist   General bed mobility comments: increased time, assist for all aspects of mobility    Transfers Overall transfer level: Needs assistance Equipment used: Rolling walker (2 wheels) Transfers: Sit to/from Stand Sit to Stand: Mod assist           General transfer comment: Pt pulling up on RW. Increased time to power up.    Ambulation/Gait               General Gait Details: unable due to weakness  Stairs            Wheelchair Mobility    Modified Rankin (Stroke Patients Only)       Balance Overall balance assessment: Needs assistance Sitting-balance support: Bilateral upper extremity supported, Feet supported Sitting balance-Leahy Scale: Poor   Postural control: Posterior lean Standing balance support: Reliant on assistive device for balance, Bilateral upper extremity supported Standing balance-Leahy Scale: Poor                               Pertinent Vitals/Pain Pain Assessment Pain Assessment: Faces Faces Pain Scale: Hurts even more Pain Location: bilat shoulders and hips Pain Descriptors / Indicators: Grimacing, Guarding, Discomfort Pain Intervention(s): Limited activity within patient's tolerance, Repositioned  Home Living Family/patient expects to be discharged to:: Private residence Living Arrangements: Spouse/significant other Available Help at Discharge: Family;Available 24 hours/day Type of Home: House Home Access: Stairs to enter Entrance Stairs-Rails: Right Entrance Stairs-Number of Steps: 4   Home Layout: One level Home Equipment: Agricultural consultant (2  wheels);Rollator (4 wheels);Cane - single point;Shower seat;Grab bars - tub/shower Additional Comments: Active with HHPT at time of admission.    Prior Function Prior Level of Function : History of Falls (last six months)             Mobility Comments: pt ambulates with use of RW, multiple falls in the last year       Hand Dominance   Dominant Hand: Right    Extremity/Trunk Assessment   Upper Extremity Assessment Upper Extremity Assessment: Generalized weakness    Lower Extremity Assessment Lower Extremity Assessment: Generalized weakness    Cervical / Trunk Assessment Cervical / Trunk Assessment: Kyphotic  Communication   Communication: HOH  Cognition Arousal/Alertness: Awake/alert Behavior During Therapy: WFL for tasks assessed/performed Overall Cognitive Status: Within Functional Limits for tasks assessed                                          General Comments      Exercises     Assessment/Plan    PT Assessment Patient needs continued PT services  PT Problem List Decreased strength;Decreased balance;Pain;Decreased mobility;Decreased activity tolerance       PT Treatment Interventions DME instruction;Functional mobility training;Balance training;Patient/family education;Gait training;Therapeutic activities;Therapeutic exercise    PT Goals (Current goals can be found in the Care Plan section)  Acute Rehab PT Goals Patient Stated Goal: "get my strength back" PT Goal Formulation: With patient/family Time For Goal Achievement: 04/15/23 Potential to Achieve Goals: Good    Frequency Min 3X/week     Co-evaluation               AM-PAC PT "6 Clicks" Mobility  Outcome Measure Help needed turning from your back to your side while in a flat bed without using bedrails?: A Lot Help needed moving from lying on your back to sitting on the side of a flat bed without using bedrails?: A Lot Help needed moving to and from a bed to a chair  (including a wheelchair)?: Total Help needed standing up from a chair using your arms (e.g., wheelchair or bedside chair)?: A Lot Help needed to walk in hospital room?: Total Help needed climbing 3-5 steps with a railing? : Total 6 Click Score: 9    End of Session Equipment Utilized During Treatment: Gait belt Activity Tolerance: Patient limited by pain Patient left: in bed;with call bell/phone within reach;with family/visitor present Nurse Communication: Mobility status PT Visit Diagnosis: Difficulty in walking, not elsewhere classified (R26.2);Muscle weakness (generalized) (M62.81)    Time: 1216-2446 PT Time Calculation (min) (ACUTE ONLY): 13 min   Charges:   PT Evaluation $PT Eval Moderate Complexity: 1 Mod          Ferd Glassing., PT  Office # [phone]   Ilda Foil 04/01/2023, 8:47 AM

## 2023-04-01 NOTE — ED Notes (Signed)
MRI called and stated patient spinal cord stimulator needed to be fully charged in order take patient to scan. Family notified of this and stated they will go home and get charger soon.

## 2023-04-01 NOTE — ED Notes (Signed)
Pt report received from previous nurse. Pt alert, not oriented. vitals stable, denies needs/complaints. Call bell in reach. No acute distress noted.

## 2023-04-01 NOTE — Care Management (Addendum)
Reached out to [facility] to make sure patient is active with them. Awaiting reply. Securechat sent to provider and RN regarding discharge plans.  [facility] is checking on status. 1130 Patient is active with [facility] for PT/OT. Spoke with wife via phone.She states she cannot get him up and down and he cannot do it by himself. Informed her that the patient needs a 3 day qualifying INP stay in order to qualify for SNF.  Options are to maximize home health, but if she needs assistance with aides this would be out of pocket. Asked her if she had any family to help, she stated that she has relatives, however they are dealing with their own families and lives. She stated she may be able to pay for SNF short term.  Messaged team to discuss

## 2023-04-01 NOTE — Progress Notes (Signed)
CSW received consult for patient to assist with SNF placement.  Patient has traditional Medicare and requires a qualifying 3 day admission in inpatient status for SNF placement.  Patient was recently discharged from [facility] in [place] on 03/15/23 after 17 days and was set up with [facility] for [facility] services.  Edwin Dada, MSW, LCSW Transitions of Care  Clinical Social Worker II [phone]

## 2023-04-01 NOTE — ED Notes (Signed)
Patient's wife gave bactrim (home med), ok per EDP.

## 2023-04-02 ENCOUNTER — Inpatient Hospital Stay (HOSPITAL_COMMUNITY): Payer: Medicare Other | Admitting: Registered Nurse

## 2023-04-02 ENCOUNTER — Emergency Department (HOSPITAL_COMMUNITY): Payer: Medicare Other

## 2023-04-02 ENCOUNTER — Encounter (HOSPITAL_COMMUNITY)
Admission: EM | Disposition: A | Discharge status: Skilled Nursing Facility | Payer: Self-pay | Source: Home / Self Care | Attending: Internal Medicine

## 2023-04-02 DIAGNOSIS — M549 Dorsalgia, unspecified: Secondary | ICD-10-CM | POA: Diagnosis present

## 2023-04-02 DIAGNOSIS — I10 Essential (primary) hypertension: Secondary | ICD-10-CM | POA: Diagnosis not present

## 2023-04-02 DIAGNOSIS — L0231 Cutaneous abscess of buttock: Secondary | ICD-10-CM | POA: Diagnosis present

## 2023-04-02 DIAGNOSIS — G928 Other toxic encephalopathy: Secondary | ICD-10-CM | POA: Diagnosis present

## 2023-04-02 DIAGNOSIS — E785 Hyperlipidemia, unspecified: Secondary | ICD-10-CM | POA: Diagnosis present

## 2023-04-02 DIAGNOSIS — I719 Aortic aneurysm of unspecified site, without rupture: Secondary | ICD-10-CM | POA: Diagnosis not present

## 2023-04-02 DIAGNOSIS — R338 Other retention of urine: Secondary | ICD-10-CM | POA: Diagnosis present

## 2023-04-02 DIAGNOSIS — R6520 Severe sepsis without septic shock: Secondary | ICD-10-CM | POA: Diagnosis present

## 2023-04-02 DIAGNOSIS — M4805 Spinal stenosis, thoracolumbar region: Secondary | ICD-10-CM | POA: Diagnosis present

## 2023-04-02 DIAGNOSIS — I119 Hypertensive heart disease without heart failure: Secondary | ICD-10-CM | POA: Diagnosis present

## 2023-04-02 DIAGNOSIS — N3000 Acute cystitis without hematuria: Secondary | ICD-10-CM | POA: Diagnosis present

## 2023-04-02 DIAGNOSIS — G9341 Metabolic encephalopathy: Secondary | ICD-10-CM | POA: Diagnosis present

## 2023-04-02 DIAGNOSIS — Z87891 Personal history of nicotine dependence: Secondary | ICD-10-CM | POA: Diagnosis not present

## 2023-04-02 DIAGNOSIS — A419 Sepsis, unspecified organism: Secondary | ICD-10-CM | POA: Diagnosis present

## 2023-04-02 DIAGNOSIS — E538 Deficiency of other specified B group vitamins: Secondary | ICD-10-CM | POA: Diagnosis present

## 2023-04-02 DIAGNOSIS — D638 Anemia in other chronic diseases classified elsewhere: Secondary | ICD-10-CM | POA: Diagnosis present

## 2023-04-02 DIAGNOSIS — B9620 Unspecified Escherichia coli [E. coli] as the cause of diseases classified elsewhere: Secondary | ICD-10-CM | POA: Diagnosis present

## 2023-04-02 DIAGNOSIS — R339 Retention of urine, unspecified: Secondary | ICD-10-CM | POA: Diagnosis not present

## 2023-04-02 DIAGNOSIS — K612 Anorectal abscess: Secondary | ICD-10-CM | POA: Diagnosis present

## 2023-04-02 DIAGNOSIS — N179 Acute kidney failure, unspecified: Secondary | ICD-10-CM | POA: Diagnosis present

## 2023-04-02 DIAGNOSIS — R531 Weakness: Secondary | ICD-10-CM | POA: Diagnosis present

## 2023-04-02 DIAGNOSIS — E871 Hypo-osmolality and hyponatremia: Secondary | ICD-10-CM | POA: Diagnosis present

## 2023-04-02 DIAGNOSIS — K610 Anal abscess: Secondary | ICD-10-CM | POA: Diagnosis not present

## 2023-04-02 DIAGNOSIS — Z66 Do not resuscitate: Secondary | ICD-10-CM | POA: Diagnosis present

## 2023-04-02 DIAGNOSIS — Z1152 Encounter for screening for COVID-19: Secondary | ICD-10-CM | POA: Diagnosis not present

## 2023-04-02 DIAGNOSIS — G8929 Other chronic pain: Secondary | ICD-10-CM | POA: Diagnosis present

## 2023-04-02 DIAGNOSIS — N401 Enlarged prostate with lower urinary tract symptoms: Secondary | ICD-10-CM | POA: Diagnosis present

## 2023-04-02 DIAGNOSIS — Z981 Arthrodesis status: Secondary | ICD-10-CM | POA: Diagnosis not present

## 2023-04-02 DIAGNOSIS — Z8673 Personal history of transient ischemic attack (TIA), and cerebral infarction without residual deficits: Secondary | ICD-10-CM | POA: Diagnosis not present

## 2023-04-02 HISTORY — PX: INCISION AND DRAINAGE ABSCESS: SHX5864

## 2023-04-02 LAB — BASIC METABOLIC PANEL
Anion gap: 12 (ref 5–15)
BUN: 24 mg/dL — ABNORMAL HIGH (ref 8–23)
CO2: 25 mmol/L (ref 22–32)
Calcium: 9.8 mg/dL (ref 8.9–10.3)
Chloride: 93 mmol/L — ABNORMAL LOW (ref 98–111)
Creatinine, Ser: 1.38 mg/dL — ABNORMAL HIGH (ref 0.61–1.24)
GFR, Estimated: 50 mL/min — ABNORMAL LOW (ref >60)
Glucose, Bld: 149 mg/dL — ABNORMAL HIGH (ref 70–99)
Potassium: 3.9 mmol/L (ref 3.5–5.1)
Sodium: 130 mmol/L — ABNORMAL LOW (ref 135–145)

## 2023-04-02 LAB — IRON AND TIBC
Iron: 17 ug/dL — ABNORMAL LOW (ref 45–182)
Saturation Ratios: 6 % — ABNORMAL LOW (ref 17.9–39.5)
TIBC: 284 ug/dL (ref 250–450)
UIBC: 267 ug/dL

## 2023-04-02 LAB — URINE CULTURE: Culture: NO GROWTH

## 2023-04-02 LAB — CBC WITH DIFFERENTIAL/PLATELET
Abs Immature Granulocytes: 0.07 10*3/uL (ref 0.00–0.07)
Basophils Absolute: 0.1 10*3/uL (ref 0.0–0.1)
Basophils Relative: 0 %
Eosinophils Absolute: 0.2 10*3/uL (ref 0.0–0.5)
Eosinophils Relative: 1 %
HCT: 37.5 % — ABNORMAL LOW (ref 39.0–52.0)
Hemoglobin: 13.1 g/dL (ref 13.0–17.0)
Immature Granulocytes: 0 %
Lymphocytes Relative: 7 %
Lymphs Abs: 1.1 10*3/uL (ref 0.7–4.0)
MCH: 33.4 pg (ref 26.0–34.0)
MCHC: 34.9 g/dL (ref 30.0–36.0)
MCV: 95.7 fL (ref 80.0–100.0)
Monocytes Absolute: 1.7 10*3/uL — ABNORMAL HIGH (ref 0.1–1.0)
Monocytes Relative: 10 %
Neutro Abs: 13.7 10*3/uL — ABNORMAL HIGH (ref 1.7–7.7)
Neutrophils Relative %: 82 %
Platelets: 311 10*3/uL (ref 150–400)
RBC: 3.92 MIL/uL — ABNORMAL LOW (ref 4.22–5.81)
RDW: 13.6 % (ref 11.5–15.5)
WBC: 16.8 10*3/uL — ABNORMAL HIGH (ref 4.0–10.5)
nRBC: 0 % (ref 0.0–0.2)

## 2023-04-02 LAB — FOLATE: Folate: 14 ng/mL (ref >5.9)

## 2023-04-02 LAB — RETICULOCYTES
Immature Retic Fract: 15.5 % (ref 2.3–15.9)
RBC.: 4 MIL/uL — ABNORMAL LOW (ref 4.22–5.81)
Retic Count, Absolute: 60.4 10*3/uL (ref 19.0–186.0)
Retic Ct Pct: 1.5 % (ref 0.4–3.1)

## 2023-04-02 LAB — CK: Total CK: 234 U/L (ref 49–397)

## 2023-04-02 LAB — HIV ANTIBODY (ROUTINE TESTING W REFLEX): HIV Screen 4th Generation wRfx: NONREACTIVE

## 2023-04-02 LAB — VITAMIN B12: Vitamin B-12: 252 pg/mL (ref 180–914)

## 2023-04-02 LAB — AMMONIA: Ammonia: 33 umol/L (ref 9–35)

## 2023-04-02 LAB — LACTIC ACID, PLASMA: Lactic Acid, Venous: 1 mmol/L (ref 0.5–1.9)

## 2023-04-02 LAB — FERRITIN: Ferritin: 149 ng/mL (ref 24–336)

## 2023-04-02 SURGERY — INCISION AND DRAINAGE, ABSCESS
Anesthesia: General

## 2023-04-02 MED ORDER — ROCURONIUM BROMIDE 10 MG/ML (PF) SYRINGE
PREFILLED_SYRINGE | INTRAVENOUS | Status: DC | PRN
  Administered 2023-04-02: 30 mg via INTRAVENOUS

## 2023-04-02 MED ORDER — LIDOCAINE-EPINEPHRINE (PF) 2 %-1:200000 IJ SOLN
10.0000 mL | Freq: Once | INTRAMUSCULAR | Status: AC
  Administered 2023-04-02: 10 mL
  Filled 2023-04-02: qty 20

## 2023-04-02 MED ORDER — ONDANSETRON HCL 4 MG PO TABS: 4.0000 mg | ORAL_TABLET | Freq: Four times a day (QID) | ORAL | Status: DC | PRN

## 2023-04-02 MED ORDER — GADOBUTROL 1 MMOL/ML IV SOLN
8.6000 mL | Freq: Once | INTRAVENOUS | Status: AC | PRN
  Administered 2023-04-02: 8.6 mL via INTRAVENOUS

## 2023-04-02 MED ORDER — FENTANYL CITRATE (PF) 250 MCG/5ML IJ SOLN
INTRAMUSCULAR | Status: AC
  Filled 2023-04-02: qty 5

## 2023-04-02 MED ORDER — CEPHALEXIN 250 MG PO CAPS: 500.0000 mg | ORAL_CAPSULE | Freq: Two times a day (BID) | ORAL | Status: DC

## 2023-04-02 MED ORDER — PROPOFOL 10 MG/ML IV BOLUS
INTRAVENOUS | Status: AC
  Filled 2023-04-02: qty 20

## 2023-04-02 MED ORDER — ONDANSETRON HCL 4 MG/2ML IJ SOLN
4.0000 mg | Freq: Four times a day (QID) | INTRAMUSCULAR | Status: DC | PRN
  Administered 2023-04-04: 4 mg via INTRAVENOUS
  Filled 2023-04-02: qty 2

## 2023-04-02 MED ORDER — IOHEXOL 350 MG/ML SOLN
75.0000 mL | Freq: Once | INTRAVENOUS | Status: AC | PRN
  Administered 2023-04-02: 75 mL via INTRAVENOUS

## 2023-04-02 MED ORDER — SODIUM CHLORIDE 0.9 % IV SOLN: INTRAVENOUS | Status: DC

## 2023-04-02 MED ORDER — OMEGA-3-ACID ETHYL ESTERS 1 G PO CAPS
1.0000 g | ORAL_CAPSULE | Freq: Two times a day (BID) | ORAL | Status: DC
  Administered 2023-04-02: 1 g via ORAL
  Filled 2023-04-02 (×3): qty 1

## 2023-04-02 MED ORDER — HEPARIN SODIUM (PORCINE) 5000 UNIT/ML IJ SOLN
5000.0000 [IU] | Freq: Three times a day (TID) | INTRAMUSCULAR | Status: DC
  Administered 2023-04-03 – 2023-04-04 (×4): 5000 [IU] via SUBCUTANEOUS
  Filled 2023-04-02 (×4): qty 1

## 2023-04-02 MED ORDER — PIPERACILLIN-TAZOBACTAM 3.375 G IVPB 30 MIN
3.3750 g | Freq: Once | INTRAVENOUS | Status: AC
  Administered 2023-04-02: 3.375 g via INTRAVENOUS
  Filled 2023-04-02: qty 50

## 2023-04-02 MED ORDER — CEPHALEXIN 250 MG PO CAPS
500.0000 mg | ORAL_CAPSULE | Freq: Two times a day (BID) | ORAL | Status: DC
  Administered 2023-04-02: 500 mg via ORAL
  Filled 2023-04-02: qty 2

## 2023-04-02 MED ORDER — CYANOCOBALAMIN 1000 MCG/ML IJ SOLN
1000.0000 ug | Freq: Every day | INTRAMUSCULAR | Status: AC
  Administered 2023-04-02 – 2023-04-06 (×5): 1000 ug via SUBCUTANEOUS
  Filled 2023-04-02 (×5): qty 1

## 2023-04-02 MED ORDER — SODIUM CHLORIDE 0.9 % IV BOLUS
500.0000 mL | Freq: Once | INTRAVENOUS | Status: AC
  Administered 2023-04-02: 500 mL via INTRAVENOUS

## 2023-04-02 MED ORDER — BUPIVACAINE-EPINEPHRINE (PF) 0.5% -1:200000 IJ SOLN
INTRAMUSCULAR | Status: AC
  Filled 2023-04-02: qty 30

## 2023-04-02 MED ORDER — PIPERACILLIN-TAZOBACTAM 3.375 G IVPB
3.3750 g | Freq: Three times a day (TID) | INTRAVENOUS | Status: DC
  Administered 2023-04-03 – 2023-04-06 (×11): 3.375 g via INTRAVENOUS
  Filled 2023-04-02 (×11): qty 50

## 2023-04-02 SURGICAL SUPPLY — 32 items
BAG COUNTER SPONGE SURGICOUNT (BAG) ×1 IMPLANT
BLADE CLIPPER SURG (BLADE) IMPLANT
BNDG GAUZE DERMACEA FLUFF 4 (GAUZE/BANDAGES/DRESSINGS) IMPLANT
CANISTER SUCT 3000ML PPV (MISCELLANEOUS) ×1 IMPLANT
COVER SURGICAL LIGHT HANDLE (MISCELLANEOUS) ×1 IMPLANT
DRAIN PENROSE 12X.25 LTX STRL (MISCELLANEOUS) IMPLANT
DRAPE LAPAROSCOPIC ABDOMINAL (DRAPES) IMPLANT
DRAPE LAPAROTOMY 100X72 PEDS (DRAPES) IMPLANT
ELECT REM PT RETURN 9FT ADLT (ELECTROSURGICAL) ×1
ELECTRODE REM PT RTRN 9FT ADLT (ELECTROSURGICAL) ×1 IMPLANT
GAUZE PAD ABD 8X10 STRL (GAUZE/BANDAGES/DRESSINGS) IMPLANT
GAUZE SPONGE 4X4 12PLY STRL (GAUZE/BANDAGES/DRESSINGS) IMPLANT
GLOVE BIOGEL PI IND STRL 7.0 (GLOVE) ×1 IMPLANT
GLOVE SURG SS PI 7.0 STRL IVOR (GLOVE) ×1 IMPLANT
GOWN STRL REUS W/ TWL LRG LVL3 (GOWN DISPOSABLE) ×2 IMPLANT
GOWN STRL REUS W/TWL LRG LVL3 (GOWN DISPOSABLE) ×2
KIT BASIN OR (CUSTOM PROCEDURE TRAY) ×1 IMPLANT
KIT TURNOVER KIT B (KITS) ×1 IMPLANT
NDL 22X1.5 STRL (OR ONLY) (MISCELLANEOUS) ×1 IMPLANT
NDL HYPO 25GX1X1/2 BEV (NEEDLE) IMPLANT
NEEDLE 22X1.5 STRL (OR ONLY) (MISCELLANEOUS) ×1 IMPLANT
NEEDLE HYPO 25GX1X1/2 BEV (NEEDLE) ×1 IMPLANT
NS IRRIG 1000ML POUR BTL (IV SOLUTION) ×1 IMPLANT
PACK GENERAL/GYN (CUSTOM PROCEDURE TRAY) ×1 IMPLANT
PAD ARMBOARD 7.5X6 YLW CONV (MISCELLANEOUS) ×1 IMPLANT
PENCIL SMOKE EVACUATOR (MISCELLANEOUS) ×1 IMPLANT
SUT SILK 3 0 SH CR/8 (SUTURE) IMPLANT
SWAB COLLECTION DEVICE MRSA (MISCELLANEOUS) IMPLANT
SWAB CULTURE ESWAB REG 1ML (MISCELLANEOUS) IMPLANT
SYR CONTROL 10ML LL (SYRINGE) IMPLANT
TOWEL GREEN STERILE (TOWEL DISPOSABLE) ×1 IMPLANT
TOWEL GREEN STERILE FF (TOWEL DISPOSABLE) ×1 IMPLANT

## 2023-04-02 NOTE — ED Notes (Signed)
Pt returned from MRI °

## 2023-04-02 NOTE — ED Notes (Signed)
Patient transported to CT 

## 2023-04-02 NOTE — ED Notes (Signed)
Coughs and chokes with swallowing, wife at [facility] says he gets strangled easily, cough back up his fish oil gel cap, SLP ordered.

## 2023-04-02 NOTE — ED Notes (Signed)
[facility] hospitalist paged for update

## 2023-04-02 NOTE — ED Notes (Signed)
Report received. Moved from [facility] to [facility]. Pt boarding. Pending MRI, pending possible admission. Resting, NAD, calm, confused, interactive, speech clear. Wife at [facility].

## 2023-04-02 NOTE — Consult Note (Signed)
Reason for Consult:perirectal abscess Referring Provider: [name] is an 89 y.o. male.  HPI: 89 yo male with fevers and lethargy for the last 2 days. He had a similar issue and was diagnosed with UTI in March. He was found to have a perirectal abscess vs fistula in imaging.  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    Hx: of after 2013 back surgery   GERD (gastroesophageal reflux disease)    Hearing aid worn    Hx: of right ear   Hyperlipemia    Hypertension    Lumbar herniated disc    Lumbar stenosis    Macular degeneration right eye dry   PONV (postoperative nausea and vomiting)    takes iv nausea meds    Seasonal allergies    Hx: of   Stroke     Past Surgical History:  Procedure Laterality Date   BACK SURGERY  2013, x 3 with dr Yetta Barre 2014, 2016 and 2019   x 4, takes back injections monthly   COLONOSCOPY     Hx: of   ENDARTERECTOMY Right 02/07/2020   Procedure: ENDARTERECTOMY CAROTID;  Surgeon: Nada Libman, MD;  Location: [facility] OR;  Service: Vascular;  Laterality: Right;   EYE SURGERY     bil catarcts 02/2016   HERNIA REPAIR  several yrs ago   inguinal   LUMBAR LAMINECTOMY  10/26/2012   Procedure: MICRODISCECTOMY LUMBAR LAMINECTOMY;  Surgeon: Eldred Manges, MD;  Location: [facility] OR;  Service: Orthopedics;  Laterality: Right;  Right L4-5 Microdiscectomy   LUMBAR WOUND DEBRIDEMENT N/A 09/15/2021   Procedure: revision of thoracic incision, Irrigation and debridement of battery pocket wound;  Surgeon: Tia Alert, MD;  Location: [facility] OR;  Service: Neurosurgery;  Laterality: N/A;   MAXIMUM ACCESS (MAS)POSTERIOR LUMBAR INTERBODY FUSION (PLIF) 1 LEVEL N/A 04/15/2015   Procedure: Maximum Access Surgery Posterior Lumbar Interbody Fusion Lumbar two-Lumbar three extension of hardware;  Surgeon: Tia Alert, MD;   Location: [facility] NEURO ORS;  Service: Neurosurgery;  Laterality: N/A;   PATCH ANGIOPLASTY Right 02/07/2020   Procedure: PATCH ANGIOPLASTY USING Livia Snellen BIOLOGIC PATCH;  Surgeon: Nada Libman, MD;  Location: [facility] OR;  Service: Vascular;  Laterality: Right;   ROTATOR CUFF REPAIR     right shoulder - 3 times   SPINAL CORD STIMULATOR INSERTION  08/11/2021   TONSILLECTOMY  as child   TRANSURETHRAL RESECTION OF BLADDER TUMOR N/A 02/24/2017   Procedure: TRANSURETHRAL RESECTION OF BLADDER TUMOR (TURBT);  Surgeon: Sebastian Ache, MD;  Location: [facility] ORS;  Service: Urology;  Laterality: N/A;   TRANSURETHRAL RESECTION OF PROSTATE N/A 11/20/2020   Procedure: TRANSURETHRAL RESECTION OF THE PROSTATE (TURP);  Surgeon: Sebastian Ache, MD;  Location: [facility];  Service: Urology;  Laterality: N/A;    Family History  Problem Relation Age of Onset   Stroke Mother    Cancer - Prostate Father     Social History:  reports that he quit smoking about 59 years ago. His smoking use included cigarettes. He has a 15.00 pack-year smoking history. He has never used smokeless tobacco. He reports that he does not drink alcohol and does not use drugs.  Allergies:  Allergies  Allergen Reactions   Codeine Itching and Nausea And Vomiting   Other Nausea Only  Anesthesia -  given with medication for nausea    Tramadol Itching and Nausea Only    Medications: I have reviewed the patient's current medications.  Results for orders placed or performed during the hospital encounter of 03/31/23 (from the past 48 hour(s))  TSH     Status: None   Collection Time: 04/01/23  1:55 PM  Result Value Ref Range   TSH 0.676 0.350 - 4.500 uIU/mL    Comment: Performed by a 3rd Generation assay with a functional sensitivity of <=0.01 uIU/mL. Performed at [facility], [address], [place], [place] [address]   T4, free     Status: None   Collection Time: 04/01/23  1:55 PM  Result Value Ref Range   Free T4  1.10 0.61 - 1.12 ng/dL    Comment: (NOTE) Biotin ingestion may interfere with free T4 tests. If the results are inconsistent with the TSH level, previous test results, or the clinical presentation, then consider biotin interference. If needed, order repeat testing after stopping biotin. Performed at [facility], [address], [place], [place] [address]   Basic metabolic panel     Status: Abnormal   Collection Time: 04/01/23  1:55 PM  Result Value Ref Range   Sodium 129 (L) 135 - 145 mmol/L   Potassium 4.3 3.5 - 5.1 mmol/L   Chloride 93 (L) 98 - 111 mmol/L   CO2 25 22 - 32 mmol/L   Glucose, Bld 185 (H) 70 - 99 mg/dL    Comment: Glucose reference range applies only to samples taken after fasting for at least 8 hours.   BUN 11 8 - 23 mg/dL   Creatinine, Ser 0.98 0.61 - 1.24 mg/dL   Calcium 9.8 8.9 - 11.9 mg/dL   GFR, Estimated >14 >78 mL/min    Comment: (NOTE) Calculated using the CKD-EPI Creatinine Equation (2021)    Anion gap 11 5 - 15    Comment: Performed at [facility], [address], [place], [place] [address]  CBC with Differential     Status: Abnormal   Collection Time: 04/01/23  1:55 PM  Result Value Ref Range   WBC 16.6 (H) 4.0 - 10.5 K/uL   RBC 4.01 (L) 4.22 - 5.81 MIL/uL   Hemoglobin 13.4 13.0 - 17.0 g/dL   HCT 13.0 (L) 86.5 - 78.4 %   MCV 96.8 80.0 - 100.0 fL   MCH 33.4 26.0 - 34.0 pg   MCHC 34.5 30.0 - 36.0 g/dL   RDW 69.6 29.5 - 28.4 %   Platelets 307 150 - 400 K/uL   nRBC 0.0 0.0 - 0.2 %   Neutrophils Relative % 88 %   Neutro Abs 14.5 (H) 1.7 - 7.7 K/uL   Lymphocytes Relative 6 %   Lymphs Abs 1.0 0.7 - 4.0 K/uL   Monocytes Relative 6 %   Monocytes Absolute 1.0 0.1 - 1.0 K/uL   Eosinophils Relative 0 %   Eosinophils Absolute 0.0 0.0 - 0.5 K/uL   Basophils Relative 0 %   Basophils Absolute 0.1 0.0 - 0.1 K/uL   Immature Granulocytes 0 %   Abs Immature Granulocytes 0.06 0.00 - 0.07 K/uL    Comment: Performed at [facility],  [address], [place], [place] [address]  Hepatic function panel     Status: Abnormal   Collection Time: 04/01/23  1:55 PM  Result Value Ref Range   Total Protein 6.9 6.5 - 8.1 g/dL   Albumin  3.7 3.5 - 5.0 g/dL   AST 22 15 - 41 U/L   ALT 6 0 - 44 U/L   Alkaline Phosphatase 52 38 - 126 U/L   Total Bilirubin 1.8 (H) 0.3 - 1.2 mg/dL   Bilirubin, Direct 0.3 (H) 0.0 - 0.2 mg/dL   Indirect Bilirubin 1.5 (H) 0.3 - 0.9 mg/dL    Comment: Performed at [facility], [address], [place], [place] [address]  Vitamin B12     Status: None   Collection Time: 04/02/23  8:50 AM  Result Value Ref Range   Vitamin B-12 252 180 - 914 pg/mL    Comment: (NOTE) This assay is not validated for testing neonatal or myeloproliferative syndrome specimens for Vitamin B12 levels. Performed at [facility], [address] [place], [place] [address]   Folate     Status: None   Collection Time: 04/02/23  8:50 AM  Result Value Ref Range   Folate 14.0 >5.9 ng/mL    Comment: Performed at [facility], [address], [place], [place] [address]  Iron and TIBC     Status: Abnormal   Collection Time: 04/02/23  8:50 AM  Result Value Ref Range   Iron 17 (L) 45 - 182 ug/dL   TIBC 846 962 - 952 ug/dL   Saturation Ratios 6 (L) 17.9 - 39.5 %   UIBC 267 ug/dL    Comment: Performed at [facility], [address] [place], [place] [address]  Ferritin     Status: None   Collection Time: 04/02/23  8:50 AM  Result Value Ref Range   Ferritin 149 24 - 336 ng/mL    Comment: Performed at [facility], [address], [place], [place] [address]  Reticulocytes     Status: Abnormal   Collection Time: 04/02/23  8:50 AM  Result Value Ref Range   Retic Ct Pct 1.5 0.4 - 3.1 %   RBC. 4.00 (L) 4.22 - 5.81 MIL/uL   Retic Count, Absolute 60.4 19.0 - 186.0 K/uL   Immature Retic Fract 15.5 2.3 - 15.9 %    Comment: Performed at [facility], [address] [place], [place] [address]  Ammonia      Status: None   Collection Time: 04/02/23  8:50 AM  Result Value Ref Range   Ammonia 33 9 - 35 umol/L    Comment: HEMOLYSIS AT THIS LEVEL MAY AFFECT RESULT Performed at [facility], [address], [place], [place] [address]   HIV Antibody (routine testing w rflx)     Status: None   Collection Time: 04/02/23  8:50 AM  Result Value Ref Range   HIV Screen 4th Generation wRfx Non Reactive Non Reactive    Comment: Performed at [facility], [address] [place], [place] [address]  CK     Status: None   Collection Time: 04/02/23  8:50 AM  Result Value Ref Range   Total CK 234 49 - 397 U/L    Comment: Performed at [facility], [address], [place], [place] [address]  CBC with Differential     Status: Abnormal   Collection Time: 04/02/23  4:06 PM  Result Value Ref Range   WBC 16.8 (H) 4.0 - 10.5 K/uL   RBC 3.92 (L) 4.22 - 5.81 MIL/uL   Hemoglobin 13.1 13.0 - 17.0 g/dL   HCT 87.5 (L) 64.3 - 32.9 %   MCV 95.7  80.0 - 100.0 fL   MCH 33.4 26.0 - 34.0 pg   MCHC 34.9 30.0 - 36.0 g/dL   RDW 79.8 92.1 - 19.4 %   Platelets 311 150 - 400 K/uL   nRBC 0.0 0.0 - 0.2 %   Neutrophils Relative % 82 %   Neutro Abs 13.7 (H) 1.7 - 7.7 K/uL   Lymphocytes Relative 7 %   Lymphs Abs 1.1 0.7 - 4.0 K/uL   Monocytes Relative 10 %   Monocytes Absolute 1.7 (H) 0.1 - 1.0 K/uL   Eosinophils Relative 1 %   Eosinophils Absolute 0.2 0.0 - 0.5 K/uL   Basophils Relative 0 %   Basophils Absolute 0.1 0.0 - 0.1 K/uL   Immature Granulocytes 0 %   Abs Immature Granulocytes 0.07 0.00 - 0.07 K/uL    Comment: Performed at [facility], [address] [place], [place] [address]  Basic metabolic panel     Status: Abnormal   Collection Time: 04/02/23  4:06 PM  Result Value Ref Range   Sodium 130 (L) 135 - 145 mmol/L   Potassium 3.9 3.5 - 5.1 mmol/L   Chloride 93 (L) 98 - 111 mmol/L   CO2 25 22 - 32 mmol/L   Glucose, Bld 149 (H) 70 - 99 mg/dL    Comment: Glucose reference range applies  only to samples taken after fasting for at least 8 hours.   BUN 24 (H) 8 - 23 mg/dL   Creatinine, Ser 1.44 (H) 0.61 - 1.24 mg/dL   Calcium 9.8 8.9 - 81.8 mg/dL   GFR, Estimated 50 (L) >60 mL/min    Comment: (NOTE) Calculated using the CKD-EPI Creatinine Equation (2021)    Anion gap 12 5 - 15    Comment: Performed at [facility], [address], [place], [place] [address]  Lactic acid, plasma     Status: None   Collection Time: 04/02/23  8:41 PM  Result Value Ref Range   Lactic Acid, Venous 1.0 0.5 - 1.9 mmol/L    Comment: Performed at [facility], [address] [place], [place] [address]    CT ABDOMEN PELVIS W CONTRAST  Result Date: 04/02/2023 CLINICAL DATA:  Left lower quadrant pain. EXAM: CT ABDOMEN AND PELVIS WITH CONTRAST TECHNIQUE: Multidetector CT imaging of the abdomen and pelvis was performed using the standard protocol following bolus administration of intravenous contrast. RADIATION DOSE REDUCTION: This exam was performed according to the departmental dose-optimization program which includes automated exposure control, adjustment of the mA and/or kV according to patient size and/or use of iterative reconstruction technique. CONTRAST:  72mL OMNIPAQUE IOHEXOL 350 MG/ML SOLN COMPARISON:  02/22/2023 FINDINGS: Lower Chest: No acute findings. Hepatobiliary: No hepatic masses identified. A few tiny calcified gallstones are again seen, however there is no evidence of cholecystitis or biliary ductal dilatation. Pancreas:  No mass or inflammatory changes. Spleen: Within normal limits in size and appearance. Adrenals/Urinary Tract: No suspicious masses identified. 2 mm calculus seen in midpole of right kidney. No evidence of ureteral calculi or hydronephrosis. Foley catheter is seen in the urinary bladder. Stomach/Bowel: Diverticulosis is seen mainly involving the descending and sigmoid colon, however there is no evidence of diverticulitis. A complex left perianal fistula is seen in  the left ischioanal fossa which contains gas. Rim enhancing fluid collection is seen adjacent to the gluteal crease which measures 2.0 x 1.1 cm, consistent with abscess. Vascular/Lymphatic: No pathologically enlarged lymph nodes. No acute vascular findings. Aortic atherosclerotic calcification incidentally  noted. Reproductive:  No mass or other significant abnormality. Other:  None. Musculoskeletal: No suspicious bone lesions identified. Lumbar spine fusion hardware again noted. Neurostimulator device again seen with leads in the thoracic spinal canal. IMPRESSION: New complex left perianal fistula, with 2 cm perianal abscess adjacent to the gluteal crease. Colonic diverticulosis, without radiographic evidence of diverticulitis. Cholelithiasis. No radiographic evidence of cholecystitis. Tiny right renal calculus. No evidence of ureteral calculi or hydronephrosis. Aortic Atherosclerosis (ICD10-I70.0). Electronically Signed   By: Danae Orleans M.D.   On: 04/02/2023 18:25   MR Lumbar Spine W Wo Contrast  Result Date: 04/02/2023 CLINICAL DATA:  Weakness. EXAM: MRI LUMBAR SPINE WITHOUT AND WITH CONTRAST TECHNIQUE: Multiplanar and multiecho pulse sequences of the lumbar spine were obtained without and with intravenous contrast. CONTRAST:  8.56mL GADAVIST GADOBUTROL 1 MMOL/ML IV SOLN COMPARISON:  CT abdomen pelvis dated February 22, 2023. MRI lumbar spine dated August 20, 2018. FINDINGS: Segmentation:  Standard. Alignment:  No significant listhesis. Vertebrae: No fracture, evidence of discitis, or bone lesion. Prior L1-L5 PLIF status post L4 and L5 pedicle screw removal. Chronic degenerative fatty marrow endplate changes at T12-L1. Conus medullaris and cauda equina: Conus extends to the L1 level. Conus and cauda equina appear normal. Abnormal intrathecal enhancement. Paraspinal and other soft tissues: Presacral edema. No paravertebral or paraspinous inflammatory change. Disc levels: T12-L1: Chronic severe disc height loss  with bulky circumferential disc osteophyte complex, similar to most recent CT, progressed since prior MRI from 2021. Mild bilateral facet arthropathy. Mild-to-moderate spinal canal stenosis. Moderate right and mild left lateral recess stenosis. Severe right and mild-to-moderate left neuroforaminal stenosis. L1-L2: Prior posterior decompression and PLIF. No spinal canal stenosis. Unchanged residual mild bilateral neuroforaminal stenosis due to bony hypertrophy. L2-L3: Prior posterior decompression and PLIF. Unchanged mild left paracentral endplate spurring. No residual stenosis. L3-L4: Prior posterior decompression and PLIF. No residual stenosis. L4-L5: Prior posterior decompression and interbody fusion. Unchanged residual mild left neuroforaminal stenosis due to bony hypertrophy. No spinal canal or right neuroforaminal stenosis. L5-S1: Mild disc bulging and moderate to severe bilateral facet arthropathy. Unchanged moderate left and mild right neuroforaminal stenosis. No spinal canal stenosis. IMPRESSION: 1. Prior L1-L5 PLIF status post L4 and L5 pedicle screw removal. No acute abnormality. 2. Similar adjacent segment disease at T12-L1 with mild-to-moderate spinal canal stenosis and severe right neuroforaminal stenosis. 3. Similar adjacent segment disease at L5-S1 with advanced facet arthropathy and moderate left neuroforaminal stenosis at L5-S1. Electronically Signed   By: Obie Dredge M.D.   On: 04/02/2023 14:28   MR BRAIN WO CONTRAST  Result Date: 04/01/2023 CLINICAL DATA:  Initial evaluation for neuro deficit, stroke. EXAM: MRI HEAD WITHOUT CONTRAST TECHNIQUE: Multiplanar, multiecho pulse sequences of the brain and surrounding structures were obtained without intravenous contrast. COMPARISON:  CT from 03/31/2023. FINDINGS: Brain: Generalized age-related cerebral atrophy. Patchy and confluent T2/FLAIR hyperintensity involving the periventricular deep white matter both cerebral hemispheres, consistent with  chronic small vessel ischemic disease, moderate in nature. No evidence for acute or subacute ischemia. Gray-white matter differentiation maintained. No areas of chronic cortical infarction. No acute or chronic intracranial blood products. No mass lesion, midline shift or mass effect. No hydronephrosis or extra-axial fluid collection. Pituitary gland and suprasellar region within normal limits. Vascular: Major intracranial vascular flow voids are maintained. Skull and upper cervical spine: Craniocervical junction normal. Bone marrow signal intensity within normal limits. No scalp soft tissue abnormality. Sinuses/Orbits: Prior bilateral ocular lens replacement. Mild scattered mucosal thickening present about the ethmoidal air cells. Paranasal  sinuses are otherwise clear. Small left mastoid effusion noted, of doubtful significance. Other: None. IMPRESSION: 1. No acute intracranial abnormality. 2. Age-related cerebral atrophy with moderate chronic microvascular ischemic disease. Electronically Signed   By: Rise Mu M.D.   On: 04/01/2023 23:27   US Abdomen Limited RUQ (LIVER/GB)  Result Date: 04/01/2023 CLINICAL DATA:  [id number] Elevated bilirubin [id number] EXAM: ULTRASOUND ABDOMEN LIMITED RIGHT UPPER QUADRANT COMPARISON:  CT 02/22/2023 FINDINGS: Gallbladder: There are a few echogenic, shadowing stones within the gallbladder measuring up to 0.8 cm. No pericholecystic fluid or Fodge thickening visualized. No sonographic Murphy sign noted by sonographer. Common bile duct: Diameter: 7 mm. Liver: Evaluation of the left hepatic lobe was slightly limited due to overlying bowel gas. No focal lesion identified. Within normal limits in parenchymal echogenicity. Portal vein is patent on color Doppler imaging with normal direction of blood flow towards the liver. Other: None. IMPRESSION: Cholelithiasis without sonographic evidence of acute cholecystitis. Electronically Signed   By: Duanne Guess D.O.   On: 04/01/2023  16:36    Review of Systems  Constitutional: Negative.   HENT: Negative.    Eyes: Negative.   Respiratory: Negative.    Cardiovascular: Negative.   Gastrointestinal: Negative.   Genitourinary: Negative.   Musculoskeletal: Negative.   Skin: Negative.   Neurological: Negative.   Endo/Heme/Allergies: Negative.   Psychiatric/Behavioral: Negative.     PE Blood pressure (!) 173/77, pulse 92, temperature (!) 100.8 F (38.2 C), temperature source Rectal, resp. rate (!) 27, height 5\' 10"  (1.778 m), weight 86.2 kg, SpO2 94 %. Constitutional: NAD; somnolent; no deformities Eyes: Moist conjunctiva; no lid lag; anicteric; PERRL Neck: Trachea midline; no thyromegaly Lungs: Normal respiratory effort; no tactile fremitus CV: RRR; no palpable thrills; no pitting edema GI: Abd soft,; no palpable hepatosplenomegaly MSK: unable to assess gait; no clubbing/cyanosis Psychiatric: Appropriate affect; alert and oriented x3 Lymphatic: No palpable cervical or axillary lymphadenopathy Skin: No major subcutaneous nodules. Warm and dry   Assessment/Plan: 89 yo male with sepsis with concern for gas forming organism in the gluteal region -IV abx -OR for debridement -pain control  I reviewed last 24 h vitals and pain scores, last 48 h intake and output, last 24 h labs and trends, and last 24 h imaging results.  This care required high  level of medical decision making.   De Blanch Jay Tran 04/02/2023, 10:41 PM

## 2023-04-02 NOTE — ED Notes (Signed)
ED TO INPATIENT HANDOFF REPORT  ED Nurse Name and Phone #: Trula Ore [phone]  S Name/Age/Gender Jay Tran 89 y.o. male Room/Bed: 040C/040C  Code Status   Code Status: Prior  Home/SNF/Other Home Patient oriented to: self Is this baseline? No   Triage Complete: Triage complete  Chief Complaint Perianal abscess [K61.0]  Triage Note Pt BIB [facility] from home c/o generalized weakness x1 week that has gradually gotten worse. Per EMS pt was hypertensive at 224/98.    Allergies Allergies  Allergen Reactions   Codeine Itching and Nausea And Vomiting   Other Nausea Only    Anesthesia -  given with medication for nausea    Tramadol Itching and Nausea Only    Level of Care/Admitting Diagnosis ED Disposition     ED Disposition  Admit   Condition  --   Comment  Hospital Area: [facility] [[id number]]  Level of Care: Med-Surg [16]  May admit patient to [facility] or [facility] if equivalent level of care is available:: No  Covid Evaluation: Confirmed COVID Negative  Diagnosis: Perianal abscess [454098]  Admitting Physician: Charlsie Quest [[id number]]  Attending Physician: Charlsie Quest [[id number]]  Certification:: I certify this patient will need inpatient services for at least 2 midnights  Estimated Length of Stay: 3          B Medical/Surgery History 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    Hx: of after 2013 back surgery   GERD (gastroesophageal reflux disease)    Hearing aid worn    Hx: of right ear   Hyperlipemia    Hypertension    Lumbar herniated disc    Lumbar stenosis    Macular degeneration right eye dry   PONV (postoperative nausea and vomiting)    takes iv nausea meds    Seasonal allergies    Hx: of   Stroke    Past Surgical History:  Procedure Laterality Date   BACK SURGERY  2013,  x 3 with dr Yetta Barre 2014, 2016 and 2019   x 4, takes back injections monthly   COLONOSCOPY     Hx: of   ENDARTERECTOMY Right 02/07/2020   Procedure: ENDARTERECTOMY CAROTID;  Surgeon: Nada Libman, MD;  Location: [facility] OR;  Service: Vascular;  Laterality: Right;   EYE SURGERY     bil catarcts 02/2016   HERNIA REPAIR  several yrs ago   inguinal   LUMBAR LAMINECTOMY  10/26/2012   Procedure: MICRODISCECTOMY LUMBAR LAMINECTOMY;  Surgeon: Eldred Manges, MD;  Location: [facility] OR;  Service: Orthopedics;  Laterality: Right;  Right L4-5 Microdiscectomy   LUMBAR WOUND DEBRIDEMENT N/A 09/15/2021   Procedure: revision of thoracic incision, Irrigation and debridement of battery pocket wound;  Surgeon: Tia Alert, MD;  Location: [facility] OR;  Service: Neurosurgery;  Laterality: N/A;   MAXIMUM ACCESS (MAS)POSTERIOR LUMBAR INTERBODY FUSION (PLIF) 1 LEVEL N/A 04/15/2015   Procedure: Maximum Access Surgery Posterior Lumbar Interbody Fusion Lumbar two-Lumbar three extension of hardware;  Surgeon: Tia Alert, MD;  Location: [facility];  Service: Neurosurgery;  Laterality: N/A;   PATCH ANGIOPLASTY Right 02/07/2020   Procedure: PATCH ANGIOPLASTY USING Livia Snellen BIOLOGIC PATCH;  Surgeon: Nada Libman, MD;  Location: [facility] OR;  Service: Vascular;  Laterality: Right;   ROTATOR CUFF REPAIR  right shoulder - 3 times   SPINAL CORD STIMULATOR INSERTION  08/11/2021   TONSILLECTOMY  as child   TRANSURETHRAL RESECTION OF BLADDER TUMOR N/A 02/24/2017   Procedure: TRANSURETHRAL RESECTION OF BLADDER TUMOR (TURBT);  Surgeon: Sebastian Ache, MD;  Location: [facility] ORS;  Service: Urology;  Laterality: N/A;   TRANSURETHRAL RESECTION OF PROSTATE N/A 11/20/2020   Procedure: TRANSURETHRAL RESECTION OF THE PROSTATE (TURP);  Surgeon: Sebastian Ache, MD;  Location: [facility];  Service: Urology;  Laterality: N/A;     A IV Location/Drains/Wounds Patient Lines/Drains/Airways Status     Active Line/Drains/Airways      Name Placement date Placement time Site Days   Peripheral IV 04/01/23 22 G 1" Distal;Left;Posterior Forearm 04/01/23  1757  Forearm  1   Peripheral IV 04/02/23 22 G 1" Anterior;Distal;Right;Upper Arm 04/02/23  1814  Arm  less than 1   Urethral Catheter JS Straight-tip 16 Fr. 03/31/23  1931  Straight-tip  2            Intake/Output Last 24 hours  Intake/Output Summary (Last 24 hours) at 04/02/2023 2203 Last data filed at 04/02/2023 2114 Gross per 24 hour  Intake 550 ml  Output 1300 ml  Net -750 ml    Labs/Imaging Results for orders placed or performed during the hospital encounter of 03/31/23 (from the past 48 hour(s))  TSH     Status: None   Collection Time: 04/01/23  1:55 PM  Result Value Ref Range   TSH 0.676 0.350 - 4.500 uIU/mL    Comment: Performed by a 3rd Generation assay with a functional sensitivity of <=0.01 uIU/mL. Performed at [facility], [address], [place], [place] [address]   T4, free     Status: None   Collection Time: 04/01/23  1:55 PM  Result Value Ref Range   Free T4 1.10 0.61 - 1.12 ng/dL    Comment: (NOTE) Biotin ingestion may interfere with free T4 tests. If the results are inconsistent with the TSH level, previous test results, or the clinical presentation, then consider biotin interference. If needed, order repeat testing after stopping biotin. Performed at [facility], [address], [place], [place] [address]   Basic metabolic panel     Status: Abnormal   Collection Time: 04/01/23  1:55 PM  Result Value Ref Range   Sodium 129 (L) 135 - 145 mmol/L   Potassium 4.3 3.5 - 5.1 mmol/L   Chloride 93 (L) 98 - 111 mmol/L   CO2 25 22 - 32 mmol/L   Glucose, Bld 185 (H) 70 - 99 mg/dL    Comment: Glucose reference range applies only to samples taken after fasting for at least 8 hours.   BUN 11 8 - 23 mg/dL   Creatinine, Ser 1.30 0.61 - 1.24 mg/dL   Calcium 9.8 8.9 - 86.5 mg/dL   GFR, Estimated >78 >46 mL/min    Comment:  (NOTE) Calculated using the CKD-EPI Creatinine Equation (2021)    Anion gap 11 5 - 15    Comment: Performed at [facility], [address] [place], [place] [address]  CBC with Differential     Status: Abnormal   Collection Time: 04/01/23  1:55 PM  Result Value Ref Range   WBC 16.6 (H) 4.0 - 10.5 K/uL   RBC 4.01 (L) 4.22 - 5.81 MIL/uL   Hemoglobin 13.4 13.0 - 17.0 g/dL   HCT 28.4 (L) 13.2 - 44.0 %   MCV 96.8 80.0 - 100.0  fL   MCH 33.4 26.0 - 34.0 pg   MCHC 34.5 30.0 - 36.0 g/dL   RDW 73.2 20.2 - 54.2 %   Platelets 307 150 - 400 K/uL   nRBC 0.0 0.0 - 0.2 %   Neutrophils Relative % 88 %   Neutro Abs 14.5 (H) 1.7 - 7.7 K/uL   Lymphocytes Relative 6 %   Lymphs Abs 1.0 0.7 - 4.0 K/uL   Monocytes Relative 6 %   Monocytes Absolute 1.0 0.1 - 1.0 K/uL   Eosinophils Relative 0 %   Eosinophils Absolute 0.0 0.0 - 0.5 K/uL   Basophils Relative 0 %   Basophils Absolute 0.1 0.0 - 0.1 K/uL   Immature Granulocytes 0 %   Abs Immature Granulocytes 0.06 0.00 - 0.07 K/uL    Comment: Performed at [facility], [address], [place], [place] [address]  Hepatic function panel     Status: Abnormal   Collection Time: 04/01/23  1:55 PM  Result Value Ref Range   Total Protein 6.9 6.5 - 8.1 g/dL   Albumin 3.7 3.5 - 5.0 g/dL   AST 22 15 - 41 U/L   ALT 6 0 - 44 U/L   Alkaline Phosphatase 52 38 - 126 U/L   Total Bilirubin 1.8 (H) 0.3 - 1.2 mg/dL   Bilirubin, Direct 0.3 (H) 0.0 - 0.2 mg/dL   Indirect Bilirubin 1.5 (H) 0.3 - 0.9 mg/dL    Comment: Performed at [facility], [address] [place], [place] [address]  Vitamin B12     Status: None   Collection Time: 04/02/23  8:50 AM  Result Value Ref Range   Vitamin B-12 252 180 - 914 pg/mL    Comment: (NOTE) This assay is not validated for testing neonatal or myeloproliferative syndrome specimens for Vitamin B12 levels. Performed at [facility], [address], [place], [place] [address]   Folate     Status: None    Collection Time: 04/02/23  8:50 AM  Result Value Ref Range   Folate 14.0 >5.9 ng/mL    Comment: Performed at [facility], [address] [place], [place] [address]  Iron and TIBC     Status: Abnormal   Collection Time: 04/02/23  8:50 AM  Result Value Ref Range   Iron 17 (L) 45 - 182 ug/dL   TIBC 710 626 - 948 ug/dL   Saturation Ratios 6 (L) 17.9 - 39.5 %   UIBC 267 ug/dL    Comment: Performed at [facility], [address] [place], [place] [address]  Ferritin     Status: None   Collection Time: 04/02/23  8:50 AM  Result Value Ref Range   Ferritin 149 24 - 336 ng/mL    Comment: Performed at [facility], [address], [place], [place] [address]  Reticulocytes     Status: Abnormal   Collection Time: 04/02/23  8:50 AM  Result Value Ref Range   Retic Ct Pct 1.5 0.4 - 3.1 %   RBC. 4.00 (L) 4.22 - 5.81 MIL/uL   Retic Count, Absolute 60.4 19.0 - 186.0 K/uL   Immature Retic Fract 15.5 2.3 - 15.9 %    Comment: Performed at [facility], [address], [place], [place] [address]  Ammonia     Status: None   Collection Time: 04/02/23  8:50 AM  Result Value Ref Range   Ammonia 33 9 - 35 umol/L  Comment: HEMOLYSIS AT THIS LEVEL MAY AFFECT RESULT Performed at [facility], [address], [place], [place] [address]   HIV Antibody (routine testing w rflx)     Status: None   Collection Time: 04/02/23  8:50 AM  Result Value Ref Range   HIV Screen 4th Generation wRfx Non Reactive Non Reactive    Comment: Performed at [facility], [address] [place], [place] [address]  CK     Status: None   Collection Time: 04/02/23  8:50 AM  Result Value Ref Range   Total CK 234 49 - 397 U/L    Comment: Performed at [facility], [address] [place], [place] [address]  CBC with Differential     Status: Abnormal   Collection Time: 04/02/23  4:06 PM  Result Value Ref Range   WBC 16.8 (H) 4.0 - 10.5 K/uL   RBC 3.92 (L) 4.22 - 5.81 MIL/uL    Hemoglobin 13.1 13.0 - 17.0 g/dL   HCT 29.5 (L) 18.8 - 41.6 %   MCV 95.7 80.0 - 100.0 fL   MCH 33.4 26.0 - 34.0 pg   MCHC 34.9 30.0 - 36.0 g/dL   RDW 60.6 30.1 - 60.1 %   Platelets 311 150 - 400 K/uL   nRBC 0.0 0.0 - 0.2 %   Neutrophils Relative % 82 %   Neutro Abs 13.7 (H) 1.7 - 7.7 K/uL   Lymphocytes Relative 7 %   Lymphs Abs 1.1 0.7 - 4.0 K/uL   Monocytes Relative 10 %   Monocytes Absolute 1.7 (H) 0.1 - 1.0 K/uL   Eosinophils Relative 1 %   Eosinophils Absolute 0.2 0.0 - 0.5 K/uL   Basophils Relative 0 %   Basophils Absolute 0.1 0.0 - 0.1 K/uL   Immature Granulocytes 0 %   Abs Immature Granulocytes 0.07 0.00 - 0.07 K/uL    Comment: Performed at [facility], [address] [place], [place] [address]  Basic metabolic panel     Status: Abnormal   Collection Time: 04/02/23  4:06 PM  Result Value Ref Range   Sodium 130 (L) 135 - 145 mmol/L   Potassium 3.9 3.5 - 5.1 mmol/L   Chloride 93 (L) 98 - 111 mmol/L   CO2 25 22 - 32 mmol/L   Glucose, Bld 149 (H) 70 - 99 mg/dL    Comment: Glucose reference range applies only to samples taken after fasting for at least 8 hours.   BUN 24 (H) 8 - 23 mg/dL   Creatinine, Ser 5.57 (H) 0.61 - 1.24 mg/dL   Calcium 9.8 8.9 - 32.2 mg/dL   GFR, Estimated 50 (L) >60 mL/min    Comment: (NOTE) Calculated using the CKD-EPI Creatinine Equation (2021)    Anion gap 12 5 - 15    Comment: Performed at [facility], [address] [place], [place] [address]  Lactic acid, plasma     Status: None   Collection Time: 04/02/23  8:41 PM  Result Value Ref Range   Lactic Acid, Venous 1.0 0.5 - 1.9 mmol/L    Comment: Performed at [facility], [address], [place], [place] [address]   CT ABDOMEN PELVIS W CONTRAST  Result Date: 04/02/2023 CLINICAL DATA:  Left lower quadrant pain. EXAM: CT ABDOMEN AND PELVIS WITH CONTRAST TECHNIQUE: Multidetector CT imaging of the abdomen and pelvis was performed using the standard protocol following bolus  administration of intravenous contrast. RADIATION DOSE REDUCTION:  This exam was performed according to the departmental dose-optimization program which includes automated exposure control, adjustment of the mA and/or kV according to patient size and/or use of iterative reconstruction technique. CONTRAST:  75mL OMNIPAQUE IOHEXOL 350 MG/ML SOLN COMPARISON:  02/22/2023 FINDINGS: Lower Chest: No acute findings. Hepatobiliary: No hepatic masses identified. A few tiny calcified gallstones are again seen, however there is no evidence of cholecystitis or biliary ductal dilatation. Pancreas:  No mass or inflammatory changes. Spleen: Within normal limits in size and appearance. Adrenals/Urinary Tract: No suspicious masses identified. 2 mm calculus seen in midpole of right kidney. No evidence of ureteral calculi or hydronephrosis. Foley catheter is seen in the urinary bladder. Stomach/Bowel: Diverticulosis is seen mainly involving the descending and sigmoid colon, however there is no evidence of diverticulitis. 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 Rim enhancing fluid collection is seen adjacent to the gluteal crease which measures 2.0 x 1.1 cm, consistent with abscess. Vascular/Lymphatic: No pathologically enlarged lymph nodes. No acute vascular findings. Aortic atherosclerotic calcification incidentally noted. Reproductive:  No mass or other significant abnormality. Other:  None. Musculoskeletal: No suspicious bone lesions identified. Lumbar spine fusion hardware again noted. Neurostimulator device again seen with leads in the thoracic spinal canal. IMPRESSION: New complex left perianal fistula, with 2 cm perianal abscess adjacent to the gluteal crease. Colonic diverticulosis, without radiographic evidence of diverticulitis. Cholelithiasis. No radiographic evidence of cholecystitis. Tiny right renal calculus. No evidence of ureteral calculi or hydronephrosis. Aortic  Atherosclerosis (ICD10-I70.0). Electronically Signed   By: Danae Orleans M.D.   On: 04/02/2023 18:25   MR Lumbar Spine W Wo Contrast  Result Date: 04/02/2023 CLINICAL DATA:  Weakness. EXAM: MRI LUMBAR SPINE WITHOUT AND WITH CONTRAST TECHNIQUE: Multiplanar and multiecho pulse sequences of the lumbar spine were obtained without and with intravenous contrast. CONTRAST:  8.36mL GADAVIST GADOBUTROL 1 MMOL/ML IV SOLN COMPARISON:  CT abdomen pelvis dated February 22, 2023. MRI lumbar spine dated August 20, 2018. FINDINGS: Segmentation:  Standard. Alignment:  No significant listhesis. Vertebrae: No fracture, evidence of discitis, or bone lesion. Prior L1-L5 PLIF status post L4 and L5 pedicle screw removal. Chronic degenerative fatty marrow endplate changes at T12-L1. Conus medullaris and cauda equina: Conus extends to the L1 level. Conus and cauda equina appear normal. Abnormal intrathecal enhancement. Paraspinal and other soft tissues: Presacral edema. No paravertebral or paraspinous inflammatory change. Disc levels: T12-L1: Chronic severe disc height loss with bulky circumferential disc osteophyte complex, similar to most recent CT, progressed since prior MRI from 2021. Mild bilateral facet arthropathy. Mild-to-moderate spinal canal stenosis. Moderate right and mild left lateral recess stenosis. Severe right and mild-to-moderate left neuroforaminal stenosis. L1-L2: Prior posterior decompression and PLIF. No spinal canal stenosis. Unchanged residual mild bilateral neuroforaminal stenosis due to bony hypertrophy. L2-L3: Prior posterior decompression and PLIF. Unchanged mild left paracentral endplate spurring. No residual stenosis. L3-L4: Prior posterior decompression and PLIF. No residual stenosis. L4-L5: Prior posterior decompression and interbody fusion. Unchanged residual mild left neuroforaminal stenosis due to bony hypertrophy. No spinal canal or right neuroforaminal stenosis. L5-S1: Mild disc bulging and moderate to  severe bilateral facet arthropathy. Unchanged moderate left and mild right neuroforaminal stenosis. No spinal canal stenosis. IMPRESSION: 1. Prior L1-L5 PLIF status post L4 and L5 pedicle screw removal. No acute abnormality. 2. Similar adjacent segment disease at T12-L1 with mild-to-moderate spinal canal stenosis and severe right neuroforaminal stenosis. 3. Similar adjacent segment disease at L5-S1 with advanced facet arthropathy and moderate left  neuroforaminal stenosis at L5-S1. Electronically Signed   By: Obie Dredge M.D.   On: 04/02/2023 14:28   MR BRAIN WO CONTRAST  Result Date: 04/01/2023 CLINICAL DATA:  Initial evaluation for neuro deficit, stroke. EXAM: MRI HEAD WITHOUT CONTRAST TECHNIQUE: Multiplanar, multiecho pulse sequences of the brain and surrounding structures were obtained without intravenous contrast. COMPARISON:  CT from 03/31/2023. FINDINGS: Brain: Generalized age-related cerebral atrophy. Patchy and confluent T2/FLAIR hyperintensity involving the periventricular deep white matter both cerebral hemispheres, consistent with chronic small vessel ischemic disease, moderate in nature. No evidence for acute or subacute ischemia. Gray-white matter differentiation maintained. No areas of chronic cortical infarction. No acute or chronic intracranial blood products. No mass lesion, midline shift or mass effect. No hydronephrosis or extra-axial fluid collection. Pituitary gland and suprasellar region within normal limits. Vascular: Major intracranial vascular flow voids are maintained. Skull and upper cervical spine: Craniocervical junction normal. Bone marrow signal intensity within normal limits. No scalp soft tissue abnormality. Sinuses/Orbits: Prior bilateral ocular lens replacement. Mild scattered mucosal thickening present about the ethmoidal air cells. Paranasal sinuses are otherwise clear. Small left mastoid effusion noted, of doubtful significance. Other: None. IMPRESSION: 1. No acute  intracranial abnormality. 2. Age-related cerebral atrophy with moderate chronic microvascular ischemic disease. Electronically Signed   By: Rise Mu M.D.   On: 04/01/2023 23:27   US Abdomen Limited RUQ (LIVER/GB)  Result Date: 04/01/2023 CLINICAL DATA:  [id number] Elevated bilirubin [id number] EXAM: ULTRASOUND ABDOMEN LIMITED RIGHT UPPER QUADRANT COMPARISON:  CT 02/22/2023 FINDINGS: Gallbladder: There are a few echogenic, shadowing stones within the gallbladder measuring up to 0.8 cm. No pericholecystic fluid or Wollenberg thickening visualized. No sonographic Murphy sign noted by sonographer. Common bile duct: Diameter: 7 mm. Liver: Evaluation of the left hepatic lobe was slightly limited due to overlying bowel gas. No focal lesion identified. Within normal limits in parenchymal echogenicity. Portal vein is patent on color Doppler imaging with normal direction of blood flow towards the liver. Other: None. IMPRESSION: Cholelithiasis without sonographic evidence of acute cholecystitis. Electronically Signed   By: Duanne Guess D.O.   On: 04/01/2023 16:36    Pending Labs Unresulted Labs (From admission, onward)     Start     Ordered   04/03/23 0500  Comprehensive metabolic panel  Tomorrow morning,   R        04/02/23 1425   04/03/23 0500  CBC  Tomorrow morning,   R        04/02/23 1425   04/02/23 2003  Blood culture (routine x 2)  BLOOD CULTURE X 2,   R (with STAT occurrences)      04/02/23 2002   04/02/23 0755  RPR  Once,   URGENT        04/02/23 0754            Vitals/Pain Today's Vitals   04/02/23 1831 04/02/23 1900 04/02/23 1938 04/02/23 2000  BP:  (!) 142/88  (!) 173/77  Pulse:  91  92  Resp:  (!) 32  (!) 27  Temp: 99.9 F (37.7 C)  (!) 100.8 F (38.2 C)   TempSrc: Axillary  Rectal   SpO2:  94%  94%  Weight:      Height:      PainSc:        Isolation Precautions No active isolations  Medications Medications  aspirin EC tablet 81 mg (81 mg Oral Given 04/02/23 0845)   atorvastatin (LIPITOR) tablet 40 mg (40 mg Oral Given 04/01/23 2124)  finasteride (  PROSCAR) tablet 5 mg (5 mg Oral Given 04/01/23 2123)  tamsulosin (FLOMAX) capsule 0.4 mg (0.4 mg Oral Given 04/01/23 2124)  ondansetron (ZOFRAN) tablet 8 mg (has no administration in time range)  feeding supplement (ENSURE ENLIVE / ENSURE PLUS) liquid 237 mL (237 mLs Oral Not Given 04/02/23 1555)  polyvinyl alcohol (LIQUIFILM TEARS) 1.4 % ophthalmic solution 1 drop (has no administration in time range)  lidocaine (LIDODERM) 5 % 1 patch (1 patch Transdermal Patch Applied 04/02/23 1555)  acetaminophen (TYLENOL) tablet 650 mg (650 mg Oral Given 04/02/23 2045)  omega-3 acid ethyl esters (LOVAZA) capsule 1 g (1 g Oral Given 04/02/23 1203)  cyanocobalamin (VITAMIN B12) injection 1,000 mcg (1,000 mcg Subcutaneous Given 04/02/23 1204)  0.9 %  sodium chloride infusion ( Intravenous New Bag/Given 04/02/23 1555)  piperacillin-tazobactam (ZOSYN) IVPB 3.375 g (0 g Intravenous Stopped 04/02/23 2114)    Followed by  piperacillin-tazobactam (ZOSYN) IVPB 3.375 g (has no administration in time range)  lidocaine-EPINEPHrine (XYLOCAINE W/EPI) 2 %-1:200000 (PF) injection 10 mL (has no administration in time range)  LORazepam (ATIVAN) tablet 0.5 mg (0.5 mg Oral Given 04/02/23 1203)  LORazepam (ATIVAN) tablet 1 mg (1 mg Oral Given 04/01/23 2012)  sodium chloride 0.9 % bolus 250 mL (0 mLs Intravenous Stopped 04/01/23 1840)  diazepam (VALIUM) injection 5 mg (5 mg Intravenous Given 04/01/23 2203)  gadobutrol (GADAVIST) 1 MMOL/ML injection 8.6 mL (8.6 mLs Intravenous Contrast Given 04/02/23 1336)  sodium chloride 0.9 % bolus 500 mL (0 mLs Intravenous Stopped 04/02/23 1753)  iohexol (OMNIPAQUE) 350 MG/ML injection 75 mL (75 mLs Intravenous Contrast Given 04/02/23 1746)    Mobility Ambulatory at baseline, nonambulatory at present. Needs PT eval     Focused Assessments Code Sepsis: perirectal abscess and UTI   R Recommendations: See Admitting  Provider Note  Report given to:   Additional Notes: Attempted bedside I&D procedure to address left perirectal abscess; pocket is deeper than anticipated and will require general surgery eval and treatment. Pt's wife is aware. Pt currently only oriented to self (baseline a/o x 4); two 22ga IV in place

## 2023-04-02 NOTE — ED Notes (Signed)
Moved from Purple 50 to Yellow 40. Pt remains alert, NAD, calm, interactive. Intermittently upper airway wheezing with anxiety. LS CTA. SPO2 96% RA. Occasionally tachypneic. MRI sending for pt.

## 2023-04-02 NOTE — ED Notes (Signed)
Patient transported to MRI 

## 2023-04-02 NOTE — Consult Note (Signed)
Jay Tran  ONG:[id number] DOB: [birth year] DOA: 03/31/2023 PCP: Kaleen Mask, MD    Brief Narrative:  89 year old with a history of BPH with urinary retention, chronic pain status post lumbar fusion with spinal cord stimulator, HTN, carotid artery disease, DVT, HLD, and CVA who was brought to the ER via EMS from his private home with complaints of generalized weakness x 1 week and uncontrolled blood pressure at 224/98.  He had been diagnosed with a UTI 2 days prior to his presentation and was being treated with a course of Bactrim through his PCP.  He had recently been hospitalized for a UTI following which he had a rehab stay in a local SNF before ultimately being discharged home.  CT head in the ER was unrevealing.  WBC was elevated at 18, but UA was unremarkable.  COVID and influenza testing was negative.  CXR was without focal infiltrate.  While boarding in the ER the patient has developed some intermittent confusion.  MRI of the brain has revealed no acute abnormality.  Abdominal ultrasound reveals no acute cholecystitis.  Urine culture from 4/12 reveals no growth.  Goals of Care:  Code Status: Prior   DVT prophylaxis: Lovenox  Interim Hx: Afebrile.  Some mild tachypnea recorded with respiratory rate 26-30.  Blood pressure and pulse rate stable.  Oxygen saturation 93% room air.  Resting comfortably in bed at the time my visit.  He is able to tell me where he is and what year it is.  Recognizes his wife.  Is mildly confused.  Complains of pain in his back as his spinal stimulator had to be turned off to allow for an MRI.  Assessment & Plan:  Recurrent urinary retention Patient was found to be retaining >500 cc of urine while in the ER and a Foley catheter had to be placed - UA at that time was unremarkable  Acute delirium Likely simply sundowning related to unfamiliar environment -Free T4 is normal -could be exacerbated by mild hyponatremia -TSH normal -UA unrevealing -CT and  MRI head unrevealing -check B12, folic acid, and ammonia level   B12 deficiency  SQ supplementation initiated 4/14 - change to oral dosing at d/c, and recheck level in 6-8 weeks   Hyponatremia Likely simply due to volume depletion with poor intake while boarding in the ER -gently hydrate and follow  Leukocytosis No obvious source of acute infection -WBC 18.3 at presentation and has improved to 16.6 during ER stay -follow trend  Severe generalized weakness No clear etiology other than deconditioning -no focal neurologic findings -change antibiotic today to assure this is not somehow contributing -check CK  Disposition The patient has traditional Medicare and requires a qualifying 3-day admission as an inpatient for SNF placement -he was discharged from [facility] 03/15/2019 7:04 day rehab stay and home health services were arranged at the time of that discharge  Family Communication: Spoke with wife at bedside at length Disposition: Difficult situation as patient does not have a qualifying diagnosis for inpatient admission but is also unsafe to send home  Objective: Blood pressure 110/64, pulse 85, temperature 98.5 F (36.9 C), temperature source Oral, resp. rate (!) 26, height 5\' 10"  (1.778 m), weight 86.2 kg, SpO2 94 %.  Intake/Output Summary (Last 24 hours) at 04/02/2023 0735 Last data filed at 04/01/2023 2330 Gross per 24 hour  Intake 250 ml  Output 750 ml  Net -500 ml   Filed Weights   03/31/23 1719  Weight: 86.2 kg  Examination: General: No acute respiratory distress Lungs: Clear to auscultation bilaterally without wheezes or crackles Cardiovascular: Regular rate and rhythm without murmur gallop or rub normal S1 and S2 Abdomen: Nontender, nondistended, soft, bowel sounds positive, no rebound, no ascites, no appreciable mass Extremities: No significant cyanosis, clubbing, or edema bilateral lower extremities  CBC: Recent Labs  Lab 03/31/23 1745  04/01/23 1355  WBC 18.3* 16.6*  NEUTROABS  --  14.5*  HGB 14.3 13.4  HCT 41.8 38.8*  MCV 97.2 96.8  PLT 343 307   Basic Metabolic Panel: Recent Labs  Lab 03/31/23 1745 04/01/23 1355  NA 132* 129*  K 3.8 4.3  CL 98 93*  CO2 19* 25  GLUCOSE 128* 185*  BUN 8 11  CREATININE 0.90 1.05  CALCIUM 9.8 9.8   GFR: Estimated Creatinine Clearance: 52.1 mL/min (by C-G formula based on SCr of 1.05 mg/dL).   Scheduled Meds:  aspirin EC  81 mg Oral q AM   atorvastatin  40 mg Oral QHS   feeding supplement  237 mL Oral BID BM   finasteride  5 mg Oral QHS   lidocaine  1 patch Transdermal Q24H   lisinopril  20 mg Oral QHS   sulfamethoxazole-trimethoprim  1 tablet Oral Q12H   tamsulosin  0.4 mg Oral QHS     LOS: 0 days   Lonia Blood, MD [facility] Office  [phone] Pager - Text Page per Loretha Stapler  If 7PM-7AM, please contact night-coverage per Amion 04/02/2023, 7:35 AM

## 2023-04-02 NOTE — H&P (Signed)
History and Physical    Tran Jay QMG:[id number] DOB: 1933/12/31 DOA: 03/31/2023  PCP: Kaleen Mask, MD  Patient coming from: Home  I have personally briefly reviewed patient's old medical records in [facility]  Chief Complaint: Weakness  HPI: Jay Tran is a 89 y.o. male with medical history significant for BPH with urinary retention, chronic pain s/p lumbar fusion with spinal cord stimulator, HTN, carotid artery disease, remote DVT, HLD, CVA who initially presented to the ED 4/12 for evaluation of weakness.  He was diagnosed with UTI 2 days prior to presentation and was started on a course of Bactrim.  He has been boarding in the ED the last 2 days due to his weakness make it unsafe for him to return home however was not meeting admission criteria.  He was noted to have persistent leukocytosis.  Foley catheter was placed due to urinary retention.  Urine culture has been no growth.  Further workup has now revealed new complex left perianal fistula with 2 cm perianal abscess adjacent to the gluteal crease seen on CT abdomen/pelvis.  EDP spoke with general surgery, Dr. Sheliah Hatch, their team will evaluate in hospital.    Patient has been placed on IV Zosyn.  He has been given additional IV fluids due to increase in creatinine from 0.9 to 1.38.  Additional workup included MRI lumbar spine with and without contrast showed prior L1-L5 PLIF s/p L4 and L5 pedicle screw removal without acute abnormality.  Similar T12-L1 mild to moderate spinal canal stenosis and severe right neuroforaminal stenosis noted.  Similar adjacent segment disease at L5-S1 with advanced facet arthropathy and moderate left neuroforaminal stenosis at L5-S1 also seen.  ED Course  Labs/Imaging on admission: I have personally reviewed following labs and imaging studies.  Labs notable for WBC 18.3 > 16.8, creatinine 0.90 > 1.38, sodium 132 > 130.  CT A/P shows new complex left perianal fistula with 2 cm  perianal abscess.  Colonic diverticulosis without diverticulitis, cholelithiasis without cholecystitis, tiny right renal calculus without ureteral calculi or hydronephrosis also seen.  Review of Systems: All systems reviewed and are negative except as documented in history of present illness above.   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    Hx: of after 2013 back surgery   GERD (gastroesophageal reflux disease)    Hearing aid worn    Hx: of right ear   Hyperlipemia    Hypertension    Lumbar herniated disc    Lumbar stenosis    Macular degeneration right eye dry   PONV (postoperative nausea and vomiting)    takes iv nausea meds    Seasonal allergies    Hx: of   Stroke     Past Surgical History:  Procedure Laterality Date   BACK SURGERY  2013, x 3 with dr Yetta Barre 2014, 2016 and 2019   x 4, takes back injections monthly   COLONOSCOPY     Hx: of   ENDARTERECTOMY Right 02/07/2020   Procedure: ENDARTERECTOMY CAROTID;  Surgeon: Nada Libman, MD;  [facility] OR;  Service: Vascular;  Laterality: Right;   EYE SURGERY     bil catarcts 02/2016   HERNIA REPAIR  several yrs ago   inguinal   LUMBAR LAMINECTOMY  10/26/2012   Procedure: MICRODISCECTOMY LUMBAR LAMINECTOMY;  Surgeon:  Eldred Manges, MD;  Location: [facility] OR;  Service: Orthopedics;  Laterality: Right;  Right L4-5 Microdiscectomy   LUMBAR WOUND DEBRIDEMENT N/A 09/15/2021   Procedure: revision of thoracic incision, Irrigation and debridement of battery pocket wound;  Surgeon: Tia Alert, MD;  Location: [facility] OR;  Service: Neurosurgery;  Laterality: N/A;   MAXIMUM ACCESS (MAS)POSTERIOR LUMBAR INTERBODY FUSION (PLIF) 1 LEVEL N/A 04/15/2015   Procedure: Maximum Access Surgery Posterior Lumbar Interbody Fusion Lumbar two-Lumbar three extension of hardware;  Surgeon: Tia Alert, MD;   Location: [facility] ORS;  Service: Neurosurgery;  Laterality: N/A;   PATCH ANGIOPLASTY Right 02/07/2020   Procedure: PATCH ANGIOPLASTY USING Livia Snellen BIOLOGIC PATCH;  Surgeon: Nada Libman, MD;  Location: [facility] OR;  Service: Vascular;  Laterality: Right;   ROTATOR CUFF REPAIR     right shoulder - 3 times   SPINAL CORD STIMULATOR INSERTION  08/11/2021   TONSILLECTOMY  as child   TRANSURETHRAL RESECTION OF BLADDER TUMOR N/A 02/24/2017   Procedure: TRANSURETHRAL RESECTION OF BLADDER TUMOR (TURBT);  Surgeon: Sebastian Ache, MD;  Location: [facility] ORS;  Service: Urology;  Laterality: N/A;   TRANSURETHRAL RESECTION OF PROSTATE N/A 11/20/2020   Procedure: TRANSURETHRAL RESECTION OF THE PROSTATE (TURP);  Surgeon: Sebastian Ache, MD;  Location: [facility];  Service: Urology;  Laterality: N/A;    Social History:  reports that he quit smoking about 59 years ago. His smoking use included cigarettes. He has a 15.00 pack-year smoking history. He has never used smokeless tobacco. He reports that he does not drink alcohol and does not use drugs.  Allergies  Allergen Reactions   Codeine Itching and Nausea And Vomiting   Other Nausea Only    Anesthesia -  given with medication for nausea    Tramadol Itching and Nausea Only    Family History  Problem Relation Age of Onset   Stroke Mother    Cancer - Prostate Father      Prior to Admission medications   Medication Sig Start Date End Date Taking? Authorizing Provider  acetaminophen (TYLENOL) 500 MG tablet Take 1-2 tablets (500-1,000 mg total) by mouth every 6 (six) hours as needed for mild pain, headache or moderate pain. 02/26/23   Hongalgi, Maximino Greenland, MD  aspirin EC 81 MG tablet Take 81 mg by mouth in the morning. Swallow whole.    [provider]  atorvastatin (LIPITOR) 40 MG tablet TAKE 1 TABLET BY MOUTH ONCE DAILY AT  6  PM Patient taking differently: Take 40 mg by mouth at bedtime. 03/16/21   Ihor Austin, NP  finasteride  (PROSCAR) 5 MG tablet Take 5 mg by mouth at bedtime.     [provider]  Liniments (SALONPAS PAIN RELIEF PATCH EX) Place 1 patch onto the skin daily as needed (to affected area for pain).     [provider]  lisinopril (PRINIVIL,ZESTRIL) 20 MG tablet Take 20 mg by mouth at bedtime.     [provider]  Multiple Vitamins-Minerals (PRESERVISION AREDS 2 PO) Take 1 capsule by mouth 2 (two) times daily.     [provider]  Omega-3 Fatty Acids (FISH OIL) 1200 MG CAPS Take 1,200 mg by mouth daily with breakfast.     [provider]  ondansetron (ZOFRAN) 8 MG tablet Take 8 mg by mouth every 8 (eight) hours as needed for nausea or vomiting.    [provider]  SYSTANE 0.4-0.3 % SOLN Place 1 drop into both eyes 3 (three) times  daily as needed (for dryness).    [provider]  Tamsulosin HCl (FLOMAX) 0.4 MG CAPS Take 0.4 mg by mouth at bedtime.     [provider]  tiZANidine (ZANAFLEX) 4 MG tablet Take 1 tablet (4 mg total) by mouth every 8 (eight) hours as needed for muscle spasms. Patient taking differently: Take 4-8 mg by mouth at bedtime. 08/11/18   Tia Alert, MD  Turmeric (QC TUMERIC COMPLEX PO) Take 500 mg by mouth daily with breakfast.    [provider]    Physical Exam: Vitals:   04/02/23 1831 04/02/23 1900 04/02/23 1938 04/02/23 2000  BP:  (!) 142/88  (!) 173/77  Pulse:  91  92  Resp:  (!) 32  (!) 27  Temp: 99.9 F (37.7 C)  (!) 100.8 F (38.2 C)   TempSrc: Axillary  Rectal   SpO2:  94%  94%  Weight:      Height:       Exam limited due to somnolence. Constitutional: Somnolent, resting in bed with head elevated, briefly awakens to noxious stimuli Eyes: EOMI, lids and conjunctivae normal ENMT: Mucous membranes are dry. Posterior pharynx clear of any exudate or lesions.Normal dentition.  Neck: normal, supple, no masses. Respiratory: clear to auscultation anteriorly. Normal respiratory effort. No  accessory muscle use.  Cardiovascular: Regular rate and rhythm, no murmurs / rubs / gallops. No extremity edema. 2+ pedal pulses. Abdomen: no tenderness, no masses palpated.  Musculoskeletal: no clubbing / cyanosis. No joint deformity upper and lower extremities. Good ROM, no contractures. Normal muscle tone.  Skin: no rashes, lesions, ulcers. No induration Neurologic: Sensation intact.  Moves all extremities spontaneously, strength is equal Psychiatric: Somnolent  EKG: Personally reviewed. Sinus arrhythmia, rate 102, prolonged PR interval.  Assessment/Plan Principal Problem:   Perianal abscess Active Problems:   Essential hypertension   Benign prostatic hyperplasia with urinary retention   GERD (gastroesophageal reflux disease)   Chronic back pain   Prostatic hypertrophy   AKI (acute kidney injury)   Delirium   Weakness   Hyponatremia   Jay Tran is a 89 y.o. male with medical history significant for BPH with urinary retention, chronic pain s/p lumbar fusion with spinal cord stimulator, HTN, carotid artery disease, remote DVT, HLD, CVA who is admitted with perianal abscess with fistula.  Assessment and Plan: Sepsis due to left perianal fistula with 2 cm perianal abscess: Has been boarding in the ED but now meeting sepsis criteria with fever, leukocytosis, tachypnea.  Infectious source not found to be perianal abscess with fistula. -Continue IV Zosyn -Follow blood cultures -Continue IV fluid hydration overnight -General surgery to consult  Acute kidney injury: Creatinine 1.38 today up from 0.90 on presentation.  Continue IV fluid hydration and monitor UOP.  Hyponatremia: Mild, continue normal saline infusion overnight.  Severe generalized weakness T12-L1 spinal stenosis and severe right neuroforaminal stenosis L5-S1 advanced facet arthropathy moderate left neuroforaminal stenosis: Multifactorial generalized weakness due to deconditioning, sepsis, and spinal  disease. -Continue PT/OT -Fall precautions  BPH with recurrent urinary retention: Foley placed in the ED.  Urine culture has been no growth.  Continue Foley care, follow-up with urology as an outpatient. -Continue Proscar and Flomax  Acute delirium: Per spouse he is more somnolent today in setting of sepsis.  Continue management as above.  B12 deficiency: He has been started on SQ supplementation.  Change to oral dosing on discharge and recheck level in 6-8 weeks.   DVT prophylaxis: heparin injection 5,000 Units Start:  04/03/23 0600 Code Status: Full code, discussed with patient's spouse on admission.  She would like to readdress CODE STATUS when patient is more coherent and able to answer for himself. Family Communication: Spouse at bedside Disposition Plan: From home, dispo pending clinical progress Consults called: General surgery Severity of Illness: The appropriate patient status for this patient is INPATIENT. Inpatient status is judged to be reasonable and necessary in order to provide the required intensity of service to ensure the patient's safety. The patient's presenting symptoms, physical exam findings, and initial radiographic and laboratory data in the context of their chronic comorbidities is felt to place them at high risk for further clinical deterioration. Furthermore, it is not anticipated that the patient will be medically stable for discharge from the hospital within 2 midnights of admission.   * I certify that at the point of admission it is my clinical judgment that the patient will require inpatient hospital care spanning beyond 2 midnights from the point of admission due to high intensity of service, high risk for further deterioration and high frequency of surveillance required.Darreld Mclean MD [facility]  If 7PM-7AM, please contact night-coverage [email or web address]  04/02/2023, 10:25 PM

## 2023-04-02 NOTE — Anesthesia Preprocedure Evaluation (Addendum)
Anesthesia Evaluation  Patient identified by MRN, date of birth, ID band Patient awake and Patient confused    Reviewed: Allergy & Precautions, NPO status , Patient's Chart, lab work & pertinent test results  History of Anesthesia Complications (+) PONV  Airway Mallampati: II  TM Distance: >3 FB Neck ROM: Full    Dental  (+) Caps, Dental Advisory Given   Pulmonary former smoker   breath sounds clear to auscultation       Cardiovascular hypertension, Pt. on medications + Peripheral Vascular Disease and + DVT   Rhythm:Regular Rate:Normal  02/2023 ECHO: EF 60-65%, normal LVF, mod LVH, normal RVF, no significant valvular abnormalities   Neuro/Psych CVA (R eye vision defect), Residual Symptoms    GI/Hepatic Neg liver ROS,GERD  Medicated and Controlled,,  Endo/Other  negative endocrine ROS    Renal/GU Renal InsufficiencyRenal disease     Musculoskeletal  (+) Arthritis ,    Abdominal   Peds  Hematology negative hematology ROS (+)   Anesthesia Other Findings   Reproductive/Obstetrics                             Anesthesia Physical Anesthesia Plan  ASA: 3  Anesthesia Plan: General   Post-op Pain Management: Ofirmev IV (intra-op)*   Induction: Intravenous  PONV Risk Score and Plan: 3 and Ondansetron, Dexamethasone and Treatment may vary due to age or medical condition  Airway Management Planned: Oral ETT  Additional Equipment: None  Intra-op Plan:   Post-operative Plan: Extubation in OR  Informed Consent: I have reviewed the patients History and Physical, chart, labs and discussed the procedure including the risks, benefits and alternatives for the proposed anesthesia with the patient or authorized representative who has indicated his/her understanding and acceptance.     Dental advisory given and Consent reviewed with POA  Plan Discussed with: CRNA and Surgeon  Anesthesia Plan  Comments: (Discussed with patient and his wife)        Anesthesia Quick Evaluation

## 2023-04-02 NOTE — ED Notes (Signed)
Coughed fish oil back up

## 2023-04-02 NOTE — Hospital Course (Signed)
Jay Tran is a 89 y.o. male with medical history significant for BPH with urinary retention, chronic pain s/p lumbar fusion with spinal cord stimulator, HTN, carotid artery disease, remote DVT, HLD, CVA who is admitted with perianal abscess with fistula.

## 2023-04-02 NOTE — Sepsis Progress Note (Signed)
Elink following for Sepsis Protocol 

## 2023-04-02 NOTE — Progress Notes (Signed)
Pharmacy Antibiotic Note  Jay Tran is a 89 y.o. male for which pharmacy has been consulted for zosyn dosing for  IAI .  Patient with a history of BPH w/ urinary retention, chronic pain s/p lumbar fusion w/ spinal cord stimulator, HTN, CAD, DVT, HLD, and CVA . Patient initially complex boarding patient.  Exam by EDP today revealing acute LLQ pain imaging performed >> CTA ab/pelvis w/ New complex left perianal fistula, with 2 cm perianal abscess  Scr 0.9>1.05>1.38 WBC 18.3>16.6>16.8; T 100.8; HR 91; RR 32 COVID neg / flu neg  Plan: STOP keflex (was on for previously diagnosed UTI) Zosyn 3.375g IV q8h (4 hour infusion) Trend WBC, Fever, Renal function F/u cultures, clinical course, WBC De-escalate when able  Height: 5\' 10"  (177.8 cm) Weight: 86.2 kg (190 lb) IBW/kg (Calculated) : 73  Temp (24hrs), Avg:98.9 F (37.2 C), Min:98.4 F (36.9 C), Max:99.9 F (37.7 C)  Recent Labs  Lab 03/31/23 1745 04/01/23 1355 04/02/23 1606  WBC 18.3* 16.6* 16.8*  CREATININE 0.90 1.05 1.38*    Estimated Creatinine Clearance: 39.7 mL/min (A) (by C-G formula based on SCr of 1.38 mg/dL (H)).    Allergies  Allergen Reactions   Codeine Itching and Nausea And Vomiting   Other Nausea Only    Anesthesia -  given with medication for nausea    Tramadol Itching and Nausea Only   Microbiology results: Pending  Thank you for allowing pharmacy to be a part of this patient's care.  Delmar Landau, PharmD, BCPS 04/02/2023 7:08 PM ED Clinical Pharmacist -  [phone]

## 2023-04-02 NOTE — ED Notes (Signed)
Obtained informed consent for OR debridement procedure. Pt's wife signed on his behalf due to altered mental status.

## 2023-04-02 NOTE — ED Provider Notes (Signed)
Emergency Medicine Observation Re-evaluation Note  Jay Tran is a 89 y.o. male, seen on rounds today.  Pt initially presented to the ED for complaints of Weakness Currently, the patient is resting.  Physical Exam  BP (!) 173/77   Pulse 92   Temp (!) 100.8 F (38.2 C) (Rectal)   Resp (!) 27   Ht 5\' 10"  (1.778 m)   Wt 86.2 kg   SpO2 94%   BMI 27.26 kg/m  Physical Exam General: NAD  Cardiac: Well perfused Lungs: Even and unlabored Neuro: 5/5 strength in all four extremities Abd: TTP of the LLQ, no rebound or guarding  .Marland KitchenIncision and Drainage  Date/Time: 04/02/2023 9:43 PM  Performed by: Ernie Avena, MD Authorized by: Ernie Avena, MD   Consent:    Consent obtained:  Written   Consent given by:  Spouse   Risks discussed:  Bleeding, incomplete drainage, damage to other organs and pain   Alternatives discussed:  Delayed treatment Universal protocol:    Patient identity confirmed:  Arm band Location:    Type:  Abscess   Size:  2cm   Location:  Anogenital   Anogenital location:  Perianal Pre-procedure details:    Skin preparation:  Povidone-iodine Sedation:    Sedation type:  None Anesthesia:    Anesthesia method:  Local infiltration   Local anesthetic:  Lidocaine 2% WITH epi Procedure type:    Complexity:  Complex Procedure details:    Ultrasound guidance: yes     Needle aspiration: yes     Needle size:  22 G   Incision types:  Stab incision   Incision depth:  Submucosal   Drainage:  Bloody   Drainage amount:  Moderate   Wound treatment:  Wound left open   Packing materials:  1/2 in iodoform gauze Post-procedure details:    Procedure completion:  Tolerated Comments:     Attempts were made to probe abscess pocket with only bloody drainage noted, pocket appears to run close to the rectum therefore further attempts at deeper probing with hemostats were aborted. General surgery notified .Critical Care  Performed by: Ernie Avena, MD Authorized by:  Ernie Avena, MD   Critical care provider statement:    Critical care time (minutes):  30   Critical care was necessary to treat or prevent imminent or life-threatening deterioration of the following conditions:  Sepsis   Critical care was time spent personally by me on the following activities:  Development of treatment plan with patient or surrogate, discussions with consultants, evaluation of patient's response to treatment, examination of patient, ordering and review of laboratory studies, ordering and review of radiographic studies, ordering and performing treatments and interventions, pulse oximetry, re-evaluation of patient's condition and review of old charts   Care discussed with: admitting provider      ED Course / MDM  EKG:EKG Interpretation  Date/Time:  Friday March 31 2023 19:43:23 EDT Ventricular Rate:  102 PR Interval:  207 QRS Duration: 99 QT Interval:  349 QTC Calculation: 455 R Axis:   -21 Text Interpretation: Fast sinus arrhythmia Borderline prolonged PR interval Borderline left axis deviation Low voltage, precordial leads Borderline T abnormalities, anterior leads Confirmed by Lockie Mola, Adam ([id number]) on 03/31/2023 7:44:47 PM  I have reviewed the labs performed to date as well as medications administered while in observation.  Recent changes in the last 24 hours include MRI brain and Lumbar spine performed.  MRI Brain: IMPRESSION:  1. No acute intracranial abnormality.  2. Age-related cerebral atrophy with moderate  chronic microvascular  ischemic disease.    MRI Lumbar Spine: IMPRESSION:  1. Prior L1-L5 PLIF status post L4 and L5 pedicle screw removal. No  acute abnormality.  2. Similar adjacent segment disease at T12-L1 with mild-to-moderate  spinal canal stenosis and severe right neuroforaminal stenosis.  3. Similar adjacent segment disease at L5-S1 with advanced facet  arthropathy and moderate left neuroforaminal stenosis at L5-S1.    Discussed with Dr.  Yetta Barre, on-call neurosurgery, who did not see any acute need for intervention. Pt can follow-up outpatient in clinic. MRI T spine pending.  Given the patient today has acute pain on exam in the LLQ, considered developing diverticulitis. CT of the Abdomen Pelvis ordered.   CT Abdomen Pelvis:  IMPRESSION:  New complex left perianal fistula, with 2 cm perianal abscess  adjacent to the gluteal crease.    Colonic diverticulosis, without radiographic evidence of  diverticulitis.    Cholelithiasis. No radiographic evidence of cholecystitis.    Tiny right renal calculus. No evidence of ureteral calculi or  hydronephrosis.    Aortic Atherosclerosis (ICD10-I70.0).    A rectal exam was performed patient was having perirectal tenderness to palpation.  He technically is meeting SIRS criteria with a leukocytosis to 16.8, elevated heart rate to the low 90s and on rectal temp was found to be febrile to 100.8. General surgery was consulted for recommendations. The patient was administered an additional IVF bolus for AKI seen on labs today. Medicine was re-engaged for admission, code sepsis called, SIRS criteria met. Dr. Sheliah Hatch recommended attempts at bedside to probe and deloculated the abscess which was attempted with only bloody drainage noted.  Given the potential depth of the abscess with appearing to track close to the rectum on ultrasound, further attempts at I&D were deferred to general surgery.  Hospitalist medicine was consulted for admission, Dr. Allena Katz accepting.  Patient was hemodynamically stable at time of admission. Family updated regarding the plan of care.   Plan  Current plan is for Inpatient admission.    Ernie Avena, MD 04/02/23 2148

## 2023-04-03 ENCOUNTER — Other Ambulatory Visit: Payer: Self-pay

## 2023-04-03 ENCOUNTER — Encounter (HOSPITAL_COMMUNITY): Payer: Self-pay | Admitting: General Surgery

## 2023-04-03 DIAGNOSIS — K610 Anal abscess: Secondary | ICD-10-CM | POA: Diagnosis not present

## 2023-04-03 LAB — CBC
HCT: 35.6 % — ABNORMAL LOW (ref 39.0–52.0)
Hemoglobin: 11.9 g/dL — ABNORMAL LOW (ref 13.0–17.0)
MCH: 32.7 pg (ref 26.0–34.0)
MCHC: 33.4 g/dL (ref 30.0–36.0)
MCV: 97.8 fL (ref 80.0–100.0)
Platelets: 302 10*3/uL (ref 150–400)
RBC: 3.64 MIL/uL — ABNORMAL LOW (ref 4.22–5.81)
RDW: 13.5 % (ref 11.5–15.5)
WBC: 16.5 10*3/uL — ABNORMAL HIGH (ref 4.0–10.5)
nRBC: 0 % (ref 0.0–0.2)

## 2023-04-03 LAB — COMPREHENSIVE METABOLIC PANEL
ALT: 7 U/L (ref 0–44)
AST: 55 U/L — ABNORMAL HIGH (ref 15–41)
Albumin: 2.8 g/dL — ABNORMAL LOW (ref 3.5–5.0)
Alkaline Phosphatase: 44 U/L (ref 38–126)
Anion gap: 11 (ref 5–15)
BUN: 20 mg/dL (ref 8–23)
CO2: 23 mmol/L (ref 22–32)
Calcium: 9.2 mg/dL (ref 8.9–10.3)
Chloride: 96 mmol/L — ABNORMAL LOW (ref 98–111)
Creatinine, Ser: 1.07 mg/dL (ref 0.61–1.24)
GFR, Estimated: >60 mL/min (ref >60)
Glucose, Bld: 136 mg/dL — ABNORMAL HIGH (ref 70–99)
Potassium: 3.8 mmol/L (ref 3.5–5.1)
Sodium: 130 mmol/L — ABNORMAL LOW (ref 135–145)
Total Bilirubin: 1.4 mg/dL — ABNORMAL HIGH (ref 0.3–1.2)
Total Protein: 6 g/dL — ABNORMAL LOW (ref 6.5–8.1)

## 2023-04-03 LAB — RPR: RPR Ser Ql: NONREACTIVE

## 2023-04-03 LAB — AEROBIC/ANAEROBIC CULTURE W GRAM STAIN (SURGICAL/DEEP WOUND)

## 2023-04-03 MED ORDER — BUPIVACAINE-EPINEPHRINE (PF) 0.5% -1:200000 IJ SOLN
INTRAMUSCULAR | Status: DC | PRN
  Administered 2023-04-03: 16 mL

## 2023-04-03 MED ORDER — PROPOFOL 10 MG/ML IV BOLUS
INTRAVENOUS | Status: DC | PRN
  Administered 2023-04-02: 150 mg via INTRAVENOUS

## 2023-04-03 MED ORDER — ONDANSETRON HCL 4 MG/2ML IJ SOLN
INTRAMUSCULAR | Status: DC | PRN
  Administered 2023-04-03: 4 mg via INTRAVENOUS

## 2023-04-03 MED ORDER — FENTANYL CITRATE (PF) 100 MCG/2ML IJ SOLN: 25.0000 ug | INTRAMUSCULAR | Status: DC | PRN

## 2023-04-03 MED ORDER — CHLORHEXIDINE GLUCONATE CLOTH 2 % EX PADS
6.0000 | MEDICATED_PAD | Freq: Every day | CUTANEOUS | Status: DC
  Administered 2023-04-03 – 2023-04-04 (×2): 6 via TOPICAL

## 2023-04-03 MED ORDER — LIDOCAINE 2% (20 MG/ML) 5 ML SYRINGE
INTRAMUSCULAR | Status: DC | PRN
  Administered 2023-04-02: 50 mg via INTRAVENOUS

## 2023-04-03 MED ORDER — PHENYLEPHRINE 80 MCG/ML (10ML) SYRINGE FOR IV PUSH (FOR BLOOD PRESSURE SUPPORT)
PREFILLED_SYRINGE | INTRAVENOUS | Status: DC | PRN
  Administered 2023-04-02: 80 ug via INTRAVENOUS
  Administered 2023-04-03 (×2): 160 ug via INTRAVENOUS

## 2023-04-03 MED ORDER — EPHEDRINE SULFATE-NACL 50-0.9 MG/10ML-% IV SOSY
PREFILLED_SYRINGE | INTRAVENOUS | Status: DC | PRN
  Administered 2023-04-03 (×2): 5 mg via INTRAVENOUS

## 2023-04-03 MED ORDER — SUGAMMADEX SODIUM 200 MG/2ML IV SOLN
INTRAVENOUS | Status: DC | PRN
  Administered 2023-04-03 (×2): 100 mg via INTRAVENOUS

## 2023-04-03 MED ORDER — LACTATED RINGERS IV SOLN: INTRAVENOUS | Status: DC | PRN

## 2023-04-03 MED ORDER — SUCCINYLCHOLINE CHLORIDE 200 MG/10ML IV SOSY
PREFILLED_SYRINGE | INTRAVENOUS | Status: DC | PRN
  Administered 2023-04-02: 140 mg via INTRAVENOUS

## 2023-04-03 NOTE — Progress Notes (Signed)
Physical Therapy Treatment Patient Details Name: Jay Tran MRN: [id number] DOB: 01/10/1934 Today's Date: 04/03/2023   History of Present Illness Pt is an 89 y.o. male who presented to the ED via EMS for home with primary c/o generalized weakness. He is currently on antibiotic for UTI. Recent hospitalization in March 2024 following a fall at home. Pt d/c'd from that hospital stay to SNF for rehab, returned home and was receiving HH. PMH: BPH, chronic back pain, OA, HTN, gait instability, GERD, UTI    PT Comments    Pt is presenting below baseline level of functioning. Currently he is a total assist for sit to stand, transfers and bed mobility. Due to pt current level of function, home set up and available assistance at home recommending skilled physical therapy services at a higher level of care and frequency 5x/weekly in order to work on strength, physical function, balance and mobility in order to decrease burden of care, risk for falls and rehospitalization. No signs/symptoms of cardiac/respiratory distress throughout session.     Recommendations for follow up therapy are one component of a multi-disciplinary discharge planning process, led by the attending physician.  Recommendations may be updated based on patient status, additional functional criteria and insurance authorization.  Follow Up Recommendations  Can patient physically be transported by private vehicle: No    Assistance Recommended at Discharge Frequent or constant Supervision/Assistance  Patient can return home with the following Assist for transportation;A lot of help with walking and/or transfers;Assistance with cooking/housework;Help with stairs or ramp for entrance   Equipment Recommendations  Wheelchair (measurements PT)    Recommendations for Other Services       Precautions / Restrictions Precautions Precautions: Fall Restrictions Weight Bearing Restrictions: No     Mobility  Bed Mobility Overal bed  mobility: Needs Assistance Bed Mobility: Rolling, Sidelying to Sit Rolling: Mod assist     Sit to supine: +2 for physical assistance, Total assist   General bed mobility comments: Pt unable to participate due to weakness and pain Patient Response: Cooperative  Transfers Overall transfer level: Needs assistance Equipment used: Ambulation equipment used Transfers: Sit to/from Stand, Bed to chair/wheelchair/BSC Sit to Stand: Total assist, +2 physical assistance, +2 safety/equipment           General transfer comment: Pt pulling up on stedy with heavy cueing for anterior fwd lean to assist. Pt was only able to stand upright for <2 seconds before bending at the hips and unable to correct. Requires 3 people with fatigue. With total assist at pelvis to get back to upright position. Transfer via Lift Equipment: Stedy  Ambulation/Gait               General Gait Details: Due to pt current functional status unable to attempt gait at this time.       Balance Overall balance assessment: Needs assistance Sitting-balance support: Bilateral upper extremity supported, Feet supported Sitting balance-Leahy Scale: Fair Sitting balance - Comments: Pt was sitting on BSC without any overt LOB   Standing balance support: Reliant on assistive device for balance, Bilateral upper extremity supported Standing balance-Leahy Scale: Zero Standing balance comment: Pt was unable to maintain standing for any amount of time without physical assistance.        Cognition Arousal/Alertness: Awake/alert Behavior During Therapy: WFL for tasks assessed/performed Overall Cognitive Status: Within Functional Limits for tasks assessed     Following Commands: Follows one step commands with increased time  General Comments General comments (skin integrity, edema, etc.): Upon entering room pt had blood saturating the R side of his gown at the abodminal R UQ; RN notified; IV was re=connected. Pt  was sitting on BSC, 3 people RN, Tech and PT to return pt to bed via stedy. Recommend Maxi Lift for transfers at this time due to significant weakness.      Pertinent Vitals/Pain Pain Assessment Pain Assessment: Faces Faces Pain Scale: Hurts even more Breathing: normal Negative Vocalization: none Facial Expression: smiling or inexpressive Body Language: relaxed Consolability: no need to console PAINAD Score: 0 Pain Location: L shoulder and bottom post sx. Pain Descriptors / Indicators: Grimacing, Guarding, Discomfort Pain Intervention(s): Monitored during session    Home Living Family/patient expects to be discharged to:: Private residence Living Arrangements: Spouse/significant other Available Help at Discharge: Family;Available 24 hours/day Type of Home: House Home Access: Stairs to enter Entrance Stairs-Rails: Right Entrance Stairs-Number of Steps: 4   Home Layout: One level Home Equipment: Agricultural consultant (2 wheels);Rollator (4 wheels);Cane - single point;Shower seat;Grab bars - tub/shower Additional Comments: Active with HHPT at time of admission.        PT Goals (current goals can now be found in the care plan section) Acute Rehab PT Goals Patient Stated Goal: "get my strength back" PT Goal Formulation: With patient/family Time For Goal Achievement: 04/15/23 Potential to Achieve Goals: Good Progress towards PT goals: Progressing toward goals    Frequency    Min 3X/week      PT Plan Current plan remains appropriate       AM-PAC PT "6 Clicks" Mobility   Outcome Measure  Help needed turning from your back to your side while in a flat bed without using bedrails?: A Lot Help needed moving from lying on your back to sitting on the side of a flat bed without using bedrails?: A Lot Help needed moving to and from a bed to a chair (including a wheelchair)?: Total Help needed standing up from a chair using your arms (e.g., wheelchair or bedside chair)?: Total Help  needed to walk in hospital room?: Total Help needed climbing 3-5 steps with a railing? : Total 6 Click Score: 8    End of Session Equipment Utilized During Treatment: Gait belt Activity Tolerance: Patient limited by pain;Patient limited by fatigue Patient left: in bed;with call bell/phone within reach;with family/visitor present;with nursing/sitter in room Nurse Communication: Mobility status PT Visit Diagnosis: Difficulty in walking, not elsewhere classified (R26.2);Muscle weakness (generalized) (M62.81)     Time: 1610-9604 PT Time Calculation (min) (ACUTE ONLY): 21 min  Charges:  $Therapeutic Activity: 8-22 mins                     Jay Tran, DPT, CLT  Acute Rehabilitation Services Office: [phone] (Secure chat preferred)    Claudia Desanctis 04/03/2023, 11:13 AM

## 2023-04-03 NOTE — Op Note (Signed)
Preoperative diagnosis: left gluteal abscess  Postoperative diagnosis: same   Procedure: incision and drainage of left gluteal abscess  Surgeon: Feliciana Rossetti, M.D.  Asst: none  Anesthesia: GETA  Indications for procedure: Jay Tran is a 89 y.o. year old male with symptoms of fevers, malaise, and delirium. On work up he was found to have a perirectal abscess as well as some gas within the gluteus. He was taken urgently to the OR due to concern for gas forming organism.  Description of procedure: The patient was brought into the operative suite. Anesthesia was administered with General endotracheal anesthesia. WHO checklist was applied. The patient was then placed in prone position. The area was prepped and draped in the usual sterile fashion.  Next, marcaine was infused into the skin of the left medial gluteus. An incision was made. Blunt dissection was used to reach the abscess and a large amount of white/pink pus was drained. The track tunneled to the superior gluteal area. A counterincision was made at the superior gluteal area. The muscle was inspected and appeared viable. Next, a 1/4" penrose was looped through the 2 incisions and ends sutured together with 3-0 silk. The wound was irrigated and then packed with saline moistened gauze. The patient awoke from anesthesia and was brought to [facility] in stable condition.  Findings: left gluteal abscess  Specimen: culture left gluteal abscess  Implant: 1/4" penrose drain, gauze packing   Blood loss: 10 ml  Local anesthesia:  16 ml marcaine   Complications: none  Feliciana Rossetti, M.D. General, Bariatric, & Minimally Invasive Surgery [facility]

## 2023-04-03 NOTE — Evaluation (Signed)
Clinical/Bedside Swallow Evaluation Patient Details  Name: Jay Tran MRN: [id number] Date of Birth: [birth year]  Today's Date: 04/03/2023 Time: SLP Start Time (ACUTE ONLY): 1038 SLP Stop Time (ACUTE ONLY): 1049 SLP Time Calculation (min) (ACUTE ONLY): 11 min  Past Medical History:  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    Hx: of after 2013 back surgery   GERD (gastroesophageal reflux disease)    Hearing aid worn    Hx: of right ear   Hyperlipemia    Hypertension    Lumbar herniated disc    Lumbar stenosis    Macular degeneration right eye dry   PONV (postoperative nausea and vomiting)    takes iv nausea meds    Seasonal allergies    Hx: of   Stroke    Past Surgical History:  Past Surgical History:  Procedure Laterality Date   BACK SURGERY  2013, x 3 with dr Yetta Barre 2014, 2016 and 2019   x 4, takes back injections monthly   COLONOSCOPY     Hx: of   ENDARTERECTOMY Right 02/07/2020   Procedure: ENDARTERECTOMY CAROTID;  Surgeon: Nada Libman, MD;  Location: [facility] OR;  Service: Vascular;  Laterality: Right;   EYE SURGERY     bil catarcts 02/2016   HERNIA REPAIR  several yrs ago   inguinal   INCISION AND DRAINAGE ABSCESS N/A 04/02/2023   Procedure: INCISION AND DRAINAGE,  LEFT GLUTEAL ABSCESS;  Surgeon: Sheliah Hatch De Blanch, MD;  Location: [facility] OR;  Service: General;  Laterality: N/A;   LUMBAR LAMINECTOMY  10/26/2012   Procedure: MICRODISCECTOMY LUMBAR LAMINECTOMY;  Surgeon: Eldred Manges, MD;  Location: [facility] OR;  Service: Orthopedics;  Laterality: Right;  Right L4-5 Microdiscectomy   LUMBAR WOUND DEBRIDEMENT N/A 09/15/2021   Procedure: revision of thoracic incision, Irrigation and debridement of battery pocket wound;  Surgeon: Tia Alert, MD;  Location[facility] OR;  Service: Neurosurgery;  Laterality: N/A;   MAXIMUM ACCESS (MAS)POSTERIOR  LUMBAR INTERBODY FUSION (PLIF) 1 LEVEL N/A 04/15/2015   Procedure: Maximum Access Surgery Posterior Lumbar Interbody Fusion Lumbar two-Lumbar three extension of hardware;  Surgeon: Tia Alert, MD;  Location: [facility] NEURO ORS;  Service: Neurosurgery;  Laterality: N/A;   PATCH ANGIOPLASTY Right 02/07/2020   Procedure: PATCH ANGIOPLASTY USING Livia Snellen BIOLOGIC PATCH;  Surgeon: Nada Libman, MD;  Location: [facility] OR;  Service: Vascular;  Laterality: Right;   ROTATOR CUFF REPAIR     right shoulder - 3 times   SPINAL CORD STIMULATOR INSERTION  08/11/2021   TONSILLECTOMY  as child   TRANSURETHRAL RESECTION OF BLADDER TUMOR N/A 02/24/2017   Procedure: TRANSURETHRAL RESECTION OF BLADDER TUMOR (TURBT);  Surgeon: Sebastian Ache, MD;  Location: [facility] ORS;  Service: Urology;  Laterality: N/A;   TRANSURETHRAL RESECTION OF PROSTATE N/A 11/20/2020   Procedure: TRANSURETHRAL RESECTION OF THE PROSTATE (TURP);  Surgeon: Sebastian Ache, MD;  Location: [facility];  Service: Urology;  Laterality: N/A;   HPI:  Jay Tran is a 89 y.o. male with medical history significant for BPH with urinary retention, chronic pain s/p lumbar fusion with spinal cord stimulator, HTN, carotid artery disease, remote DVT, HLD, CVA who is admitted with perianal abscess with fistula. Underwent surgery for abcess on 4/14.    Assessment / Plan / Recommendation  Clinical Impression  Pt  demonstrates no significant sign of dysphagia. His wife reportes occsasional coughing with liquids. Pt able to masticate. No coughing or difficulty today. Reviewed basic aspiration precautions, particularly importance of upright position for liquids. Talked about strategies for taking pills. Pt may initaite a regular diet and thin liquids. SLP will sign off. SLP Visit Diagnosis: Dysphagia, unspecified (R13.10)    Aspiration Risk  Mild aspiration risk    Diet Recommendation Regular;Thin liquid   Liquid Administration via:  Cup;Straw Medication Administration: Whole meds with puree Supervision: Patient able to self feed Compensations: Slow rate;Small sips/bites;Minimize environmental distractions Postural Changes: Seated upright at 90 degrees    Other  Recommendations Oral Care Recommendations: Oral care BID    Recommendations for follow up therapy are one component of a multi-disciplinary discharge planning process, led by the attending physician.  Recommendations may be updated based on patient status, additional functional criteria and insurance authorization.  Follow up Recommendations No SLP follow up      Assistance Recommended at Discharge    Functional Status Assessment    Frequency and Duration            Prognosis        Swallow Study   General HPI: Jay Tran is a 89 y.o. male with medical history significant for BPH with urinary retention, chronic pain s/p lumbar fusion with spinal cord stimulator, HTN, carotid artery disease, remote DVT, HLD, CVA who is admitted with perianal abscess with fistula. Underwent surgery for abcess on 4/14. Type of Study: Bedside Swallow Evaluation Previous Swallow Assessment: none Diet Prior to this Study: NPO Temperature Spikes Noted: No Respiratory Status: Room air History of Recent Intubation: Yes Total duration of intubation (days):  (procedure) Date extubated: 04/03/23 Behavior/Cognition: Alert;Cooperative Oral Cavity Assessment: Within Functional Limits Oral Care Completed by SLP: No Oral Cavity - Dentition: Adequate natural dentition Vision: Functional for self-feeding Self-Feeding Abilities: Able to feed self Patient Positioning: Upright in bed Baseline Vocal Quality: Normal Volitional Cough: Strong Volitional Swallow: Able to elicit    Oral/Motor/Sensory Function Overall Oral Motor/Sensory Function: Within functional limits   Ice Chips Ice chips: Within functional limits Presentation: Spoon   Thin Liquid Thin Liquid: Within functional  limits Presentation: Cup;Straw;Self Fed    Nectar Thick Nectar Thick Liquid: Not tested   Honey Thick Honey Thick Liquid: Not tested   Puree Puree: Within functional limits   Solid     Solid: Within functional limits Presentation: Self Fed      Claudine Mouton 04/03/2023,11:12 AM

## 2023-04-03 NOTE — Plan of Care (Signed)

## 2023-04-03 NOTE — Progress Notes (Signed)
Jay Tran  ELT:[id number] DOB: 1934-07-01 DOA: 03/31/2023 PCP: Kaleen Mask, MD    Brief Narrative:  89 year old with a history of BPH with urinary retention, chronic pain status post lumbar fusion with spinal cord stimulator, HTN, carotid artery disease, DVT, HLD, and CVA who was brought to the ER via EMS from his private home with complaints of generalized weakness x 1 week and uncontrolled blood pressure at 224/98.  He had been diagnosed with a UTI 2 days prior to his presentation and was being treated with a course of Bactrim through his PCP.  He had recently been hospitalized for a UTI following which he had a rehab stay in a local SNF before ultimately being discharged home.  CT head in the ER was unrevealing.  WBC was elevated at 18 w/o clear source initially, and UA was unremarkable.  COVID and influenza testing was negative.  CXR was without focal infiltrate.  While boarding in the ER the patient developed intermittent confusion.  MRI of the brain revealed no acute abnormality.  Abdominal ultrasound revealed no acute cholecystitis.  Urine culture from 4/12 revealed no growth. MRI lumbar spine was obtained given weakness to rule out spinal complication/infection, and was negative.   In the afternoon on 4/14 the patient was able to communicate c/o abdominal pain to the ED staff. A CT abdom/pelvis was obtained, and revealed a peri-rectal gluteal abscess. Gen Surg was consulted, and conservative attempts to address the abscess failed. The patient was therefore taken to the OR for I&D in the early morning hours of 4/15, and subsequently admitted.   Consults: Gen Surgery   Goals of Care:  Code Status: Full Code   DVT prophylaxis: Lovenox  Interim Hx: Underwent uneventful I&D of left gluteal abscess early this a.m. in OR under GETA.  Resting comfortably in bed at the time my visit with no complaints.  Assessment & Plan:  Left gluteal abscess  Status post operative I&D by  General Surgery 4/15 a.m. - continue empiric antibiotic -monitor cultures  Recurrent urinary retention Patient was found to be retaining >500 cc of urine while in the ER and a Foley catheter had to be placed - UA at that time was unremarkable  Acute delirium - toxic metabolic encephalopathy  Likely sundowning related to unfamiliar environment as well as toxic metabolic encephalopathy related to smoldering infection/gluteal abscess - free T4 is normal - TSH normal -UA unrevealing - CT and MRI head unrevealing - folic acid and ammonia level levels are normal - B12 is low and being replaced -RPR negative  B12 deficiency  SQ supplementation initiated 4/14 - change to oral dosing at d/c, and recheck level in 6-8 weeks   Hyponatremia Likely simply due to volume depletion with poor intake -continue to gently hydrate and follow  Severe generalized weakness Due to deconditioning in the setting of a smoldering infection - no focal neurologic findings -therapy is suggesting SNF rehab stay and I agree  Family Communication: Spoke with his wife at bedside at length Disposition: Will need SNF rehab stay when medically stable for discharge  Objective: Blood pressure 129/63, pulse 79, temperature 98.8 F (37.1 C), temperature source Oral, resp. rate 20, height 5\' 10"  (1.778 m), weight 85.4 kg, SpO2 99 %.  Intake/Output Summary (Last 24 hours) at 04/03/2023 1004 Last data filed at 04/03/2023 0500 Gross per 24 hour  Intake 1650 ml  Output 1251 ml  Net 399 ml    Filed Weights   03/31/23 1719 04/03/23 0158  Weight: 86.2 kg 85.4 kg    Examination: General: No acute respiratory distress Lungs: Clear to auscultation bilaterally -no wheezing Cardiovascular: Regular rate and rhythm without murmur Abdomen: Nontender, nondistended, soft, bowel sounds positive, no rebound Extremities: No significant cyanosis, clubbing, or edema bilateral lower extremities  CBC: Recent Labs  Lab 04/01/23 1355  04/02/23 1606 04/03/23 0220  WBC 16.6* 16.8* 16.5*  NEUTROABS 14.5* 13.7*  --   HGB 13.4 13.1 11.9*  HCT 38.8* 37.5* 35.6*  MCV 96.8 95.7 97.8  PLT 307 311 302    Basic Metabolic Panel: Recent Labs  Lab 04/01/23 1355 04/02/23 1606 04/03/23 0220  NA 129* 130* 130*  K 4.3 3.9 3.8  CL 93* 93* 96*  CO2 GLUCOSE 185* 149* 136*  BUN 11 24* 20  CREATININE 1.05 1.38* 1.07  CALCIUM 9.8 9.8 9.2    GFR: Estimated Creatinine Clearance: 51.2 mL/min (by C-G formula based on SCr of 1.07 mg/dL).   Scheduled Meds:  aspirin EC  81 mg Oral q AM   atorvastatin  40 mg Oral QHS   cyanocobalamin  1,000 mcg Subcutaneous Daily   feeding supplement  237 mL Oral BID BM   finasteride  5 mg Oral QHS   heparin  5,000 Units Subcutaneous Q8H   lidocaine  1 patch Transdermal Q24H   omega-3 acid ethyl esters  1 g Oral BID   tamsulosin  0.4 mg Oral QHS     LOS: 1 day   Lonia Blood, MD [facility] Office  [phone] Pager - Text Page per Amion  If 7PM-7AM, please contact night-coverage per Amion 04/03/2023, 10:04 AM

## 2023-04-03 NOTE — NC FL2 (Signed)
Agency MEDICAID FL2 LEVEL OF CARE FORM     IDENTIFICATION  Patient Name: Jay Tran Birthdate: June 13, 1934 Sex: male Admission Date (Current Location): 03/31/2023  [facility] and [place] Number:  Producer, television/film/video and Address:  [facility] [place], [address], [place], [place] [address]      Provider Number: [id number]  Attending Physician Name and Address:  Lonia Blood, MD  Relative Name and Phone Number:  R[place], [place] [phone]  [phone]    Current Level of Care: Hospital Recommended Level of Care: Skilled Nursing Facility Prior Approval Number:    Date Approved/Denied:   PASRR Number: [id number]  Discharge Plan: SNF    Current Diagnoses: Patient Active Problem List   Diagnosis Date Noted   Perianal abscess 04/02/2023   Hyponatremia 04/02/2023   Delirium 04/01/2023   Weakness 04/01/2023   Acute cystitis without hematuria 02/23/2023   Syncope and collapse 02/22/2023   AKI (acute kidney injury) 02/22/2023   Unilateral primary osteoarthritis, right hip 03/30/2022   Prostatic hypertrophy 11/20/2020   Internal carotid artery stenosis 02/05/2020   Essential hypertension 02/05/2020   Benign prostatic hyperplasia with urinary retention 02/05/2020   GERD (gastroesophageal reflux disease) 02/05/2020   Chronic back pain 02/05/2020   Vision loss of right eye 02/04/2020   Wound drainage 08/20/2018   Chronic pain of both shoulders 02/22/2017   S/P lumbar spinal fusion 04/15/2015   HNP (herniated nucleus pulposus), lumbar 10/26/2012    Orientation RESPIRATION BLADDER Height & Weight     Self, Time, Situation, Place  O2 Incontinent, Indwelling catheter Weight: 188 lb 4.4 oz (85.4 kg) Height:  5\' 10"  (177.8 cm)  BEHAVIORAL SYMPTOMS/MOOD NEUROLOGICAL BOWEL NUTRITION STATUS      Incontinent Diet (see discharge summary)  AMBULATORY STATUS COMMUNICATION OF NEEDS Skin   Total Care Verbally Surgical wounds                        Personal Care Assistance Level of Assistance  Bathing, Feeding, Dressing Bathing Assistance: Maximum assistance Feeding assistance: Limited assistance Dressing Assistance: Maximum assistance     Functional Limitations Info  Sight, Hearing, Speech Sight Info: Adequate Hearing Info: Adequate Speech Info: Adequate    SPECIAL CARE FACTORS FREQUENCY  PT (By licensed PT), OT (By licensed OT)     PT Frequency: 5x week OT Frequency: 5x week            Contractures Contractures Info: Not present    Additional Factors Info  Code Status, Allergies Code Status Info: full Allergies Info: Codeine, Other, Tramadol           Current Medications (04/03/2023):  This is the current hospital active medication list Current Facility-Administered Medications  Medication Dose Route Frequency Provider Last Rate Last Admin   acetaminophen (TYLENOL) tablet 650 mg  650 mg Oral Q6H PRN Synetta Fail, MD   650 mg at 04/02/23 2045   aspirin EC tablet 81 mg  81 mg Oral q AM Curatolo, Adam, DO   81 mg at 04/03/23 1113   atorvastatin (LIPITOR) tablet 40 mg  40 mg Oral QHS Curatolo, Adam, DO   40 mg at 04/01/23 2124   cyanocobalamin (VITAMIN B12) injection 1,000 mcg  1,000 mcg Subcutaneous Daily Jetty Duhamel T, MD   1,000 mcg at 04/03/23 1113   feeding supplement (ENSURE ENLIVE / ENSURE PLUS) liquid 237 mL  237 mL Oral BID BM Ernie Avena, MD   237 mL at 04/03/23  1113   finasteride (PROSCAR) tablet 5 mg  5 mg Oral QHS Curatolo, Adam, DO   5 mg at 04/01/23 2123   heparin injection 5,000 Units  5,000 Units Subcutaneous Q8H Darreld Mclean R, MD   5,000 Units at 04/03/23 0521   lidocaine (LIDODERM) 5 % 1 patch  1 patch Transdermal Q24H Ernie Avena, MD   1 patch at 04/02/23 1555   ondansetron (ZOFRAN) tablet 4 mg  4 mg Oral Q6H PRN Charlsie Quest, MD       Or   ondansetron (ZOFRAN) injection 4 mg  4 mg Intravenous Q6H PRN Charlsie Quest, MD       piperacillin-tazobactam (ZOSYN) IVPB  3.375 g  3.375 g Intravenous Q8H Ernie Avena, MD 12.5 mL/hr at 04/03/23 1112 3.375 g at 04/03/23 1112   polyvinyl alcohol (LIQUIFILM TEARS) 1.4 % ophthalmic solution 1 drop  1 drop Both Eyes PRN Synetta Fail, MD       tamsulosin [facility]) capsule 0.4 mg  0.4 mg Oral QHS Curatolo, Adam, DO   0.4 mg at 04/01/23 2124     Discharge Medications: Please see discharge summary for a list of discharge medications.  Relevant Imaging Results:  Relevant Lab Results:   Additional Information SSN: [id number]  Dossie Der Einar [place], [place]

## 2023-04-03 NOTE — Progress Notes (Signed)
1 Day Post-Op   Subjective/Chief Complaint:  Mental status improved  Still with moderate pain  Objective: Vital signs in last 24 hours: Temp:  [98.1 F (36.7 C)-100.8 F (38.2 C)] 98.8 F (37.1 C) (04/15 0712) Pulse Rate:  [57-93] 79 (04/15 0712) Resp:  [10-32] 20 (04/15 0500) BP: (111-173)/(59-88) 129/63 (04/15 0712) SpO2:  [93 %-100 %] 99 % (04/15 0712) Weight:  [85.4 kg] 85.4 kg (04/15 0158)    Intake/Output from previous day: 04/14 0701 - 04/15 0700 In: 1650 [I.V.:1100; IV Piggyback:550] Out: 1251 [Urine:1200; Stool:1; Blood:50] Intake/Output this shift: No intake/output data recorded.  Exam: Awake and answers questions Perineal wound clean with penrose in place and no gross purulence  Lab Results:  Recent Labs    04/02/23 1606 04/03/23 0220  WBC 16.8* 16.5*  HGB 13.1 11.9*  HCT 37.5* 35.6*  PLT 311 302   BMET Recent Labs    04/02/23 1606 04/03/23 0220  NA 130* 130*  K 3.9 3.8  CL 93* 96*  CO2 25 23  GLUCOSE 149* 136*  BUN 24* 20  CREATININE 1.38* 1.07  CALCIUM 9.8 9.2   PT/INR No results for input(s): "LABPROT", "INR" in the last 72 hours. ABG No results for input(s): "PHART", "HCO3" in the last 72 hours.  Invalid input(s): "PCO2", "PO2"  Studies/Results: CT ABDOMEN PELVIS W CONTRAST  Result Date: 04/02/2023 CLINICAL DATA:  Left lower quadrant pain. EXAM: CT ABDOMEN AND PELVIS WITH CONTRAST TECHNIQUE: Multidetector CT imaging of the abdomen and pelvis was performed using the standard protocol following bolus administration of intravenous contrast. RADIATION DOSE REDUCTION: This exam was performed according to the departmental dose-optimization program which includes automated exposure control, adjustment of the mA and/or kV according to patient size and/or use of iterative reconstruction technique. CONTRAST:  79mL OMNIPAQUE IOHEXOL 350 MG/ML SOLN COMPARISON:  02/22/2023 FINDINGS: Lower Chest: No acute findings. Hepatobiliary: No hepatic masses  identified. A few tiny calcified gallstones are again seen, however there is no evidence of cholecystitis or biliary ductal dilatation. Pancreas:  No mass or inflammatory changes. Spleen: Within normal limits in size and appearance. Adrenals/Urinary Tract: No suspicious masses identified. 2 mm calculus seen in midpole of right kidney. No evidence of ureteral calculi or hydronephrosis. Foley catheter is seen in the urinary bladder. Stomach/Bowel: Diverticulosis is seen mainly involving the descending and sigmoid colon, however there is no evidence of diverticulitis. 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 Rim enhancing fluid collection is seen adjacent to the gluteal crease which measures 2.0 x 1.1 cm, consistent with abscess. Vascular/Lymphatic: No pathologically enlarged lymph nodes. No acute vascular findings. Aortic atherosclerotic calcification incidentally noted. Reproductive:  No mass or other significant abnormality. Other:  None. Musculoskeletal: No suspicious bone lesions identified. Lumbar spine fusion hardware again noted. Neurostimulator device again seen with leads in the thoracic spinal canal. IMPRESSION: New complex left perianal fistula, with 2 cm perianal abscess adjacent to the gluteal crease. Colonic diverticulosis, without radiographic evidence of diverticulitis. Cholelithiasis. No radiographic evidence of cholecystitis. Tiny right renal calculus. No evidence of ureteral calculi or hydronephrosis. Aortic Atherosclerosis (ICD10-I70.0). Electronically Signed   By: Danae Orleans M.D.   On: 04/02/2023 18:25   MR Lumbar Spine W Wo Contrast  Result Date: 04/02/2023 CLINICAL DATA:  Weakness. EXAM: MRI LUMBAR SPINE WITHOUT AND WITH CONTRAST TECHNIQUE: Multiplanar and multiecho pulse sequences of the lumbar spine were obtained without and with intravenous contrast. CONTRAST:  8.40mL GADAVIST GADOBUTROL 1 MMOL/ML IV SOLN  COMPARISON:  CT abdomen pelvis  dated February 22, 2023. MRI lumbar spine dated August 20, 2018. FINDINGS: Segmentation:  Standard. Alignment:  No significant listhesis. Vertebrae: No fracture, evidence of discitis, or bone lesion. Prior L1-L5 PLIF status post L4 and L5 pedicle screw removal. Chronic degenerative fatty marrow endplate changes at T12-L1. Conus medullaris and cauda equina: Conus extends to the L1 level. Conus and cauda equina appear normal. Abnormal intrathecal enhancement. Paraspinal and other soft tissues: Presacral edema. No paravertebral or paraspinous inflammatory change. Disc levels: T12-L1: Chronic severe disc height loss with bulky circumferential disc osteophyte complex, similar to most recent CT, progressed since prior MRI from 2021. Mild bilateral facet arthropathy. Mild-to-moderate spinal canal stenosis. Moderate right and mild left lateral recess stenosis. Severe right and mild-to-moderate left neuroforaminal stenosis. L1-L2: Prior posterior decompression and PLIF. No spinal canal stenosis. Unchanged residual mild bilateral neuroforaminal stenosis due to bony hypertrophy. L2-L3: Prior posterior decompression and PLIF. Unchanged mild left paracentral endplate spurring. No residual stenosis. L3-L4: Prior posterior decompression and PLIF. No residual stenosis. L4-L5: Prior posterior decompression and interbody fusion. Unchanged residual mild left neuroforaminal stenosis due to bony hypertrophy. No spinal canal or right neuroforaminal stenosis. L5-S1: Mild disc bulging and moderate to severe bilateral facet arthropathy. Unchanged moderate left and mild right neuroforaminal stenosis. No spinal canal stenosis. IMPRESSION: 1. Prior L1-L5 PLIF status post L4 and L5 pedicle screw removal. No acute abnormality. 2. Similar adjacent segment disease at T12-L1 with mild-to-moderate spinal canal stenosis and severe right neuroforaminal stenosis. 3. Similar adjacent segment disease at L5-S1 with advanced facet arthropathy and moderate  left neuroforaminal stenosis at L5-S1. Electronically Signed   By: Obie Dredge M.D.   On: 04/02/2023 14:28   MR BRAIN WO CONTRAST  Result Date: 04/01/2023 CLINICAL DATA:  Initial evaluation for neuro deficit, stroke. EXAM: MRI HEAD WITHOUT CONTRAST TECHNIQUE: Multiplanar, multiecho pulse sequences of the brain and surrounding structures were obtained without intravenous contrast. COMPARISON:  CT from 03/31/2023. FINDINGS: Brain: Generalized age-related cerebral atrophy. Patchy and confluent T2/FLAIR hyperintensity involving the periventricular deep white matter both cerebral hemispheres, consistent with chronic small vessel ischemic disease, moderate in nature. No evidence for acute or subacute ischemia. Gray-white matter differentiation maintained. No areas of chronic cortical infarction. No acute or chronic intracranial blood products. No mass lesion, midline shift or mass effect. No hydronephrosis or extra-axial fluid collection. Pituitary gland and suprasellar region within normal limits. Vascular: Major intracranial vascular flow voids are maintained. Skull and upper cervical spine: Craniocervical junction normal. Bone marrow signal intensity within normal limits. No scalp soft tissue abnormality. Sinuses/Orbits: Prior bilateral ocular lens replacement. Mild scattered mucosal thickening present about the ethmoidal air cells. Paranasal sinuses are otherwise clear. Small left mastoid effusion noted, of doubtful significance. Other: None. IMPRESSION: 1. No acute intracranial abnormality. 2. Age-related cerebral atrophy with moderate chronic microvascular ischemic disease. Electronically Signed   By: Rise Mu M.D.   On: 04/01/2023 23:27   US Abdomen Limited RUQ (LIVER/GB)  Result Date: 04/01/2023 CLINICAL DATA:  [id number] Elevated bilirubin [id number] EXAM: ULTRASOUND ABDOMEN LIMITED RIGHT UPPER QUADRANT COMPARISON:  CT 02/22/2023 FINDINGS: Gallbladder: There are a few echogenic, shadowing stones  within the gallbladder measuring up to 0.8 cm. No pericholecystic fluid or Boulier thickening visualized. No sonographic Murphy sign noted by sonographer. Common bile duct: Diameter: 7 mm. Liver: Evaluation of the left hepatic lobe was slightly limited due to overlying bowel gas. No focal lesion identified. Within normal limits in parenchymal echogenicity. Portal vein is patent on color  Doppler imaging with normal direction of blood flow towards the liver. Other: None. IMPRESSION: Cholelithiasis without sonographic evidence of acute cholecystitis. Electronically Signed   By: Duanne Guess D.O.   On: 04/01/2023 16:36    Anti-infectives: Anti-infectives (From admission, onward)    Start     Dose/Rate Route Frequency Ordered Stop   04/03/23 0200  piperacillin-tazobactam (ZOSYN) IVPB 3.375 g       See Hyperspace for full Linked Orders Report.   3.375 g 12.5 mL/hr over 240 Minutes Intravenous Every 8 hours 04/02/23 1908     04/02/23 1915  piperacillin-tazobactam (ZOSYN) IVPB 3.375 g       See Hyperspace for full Linked Orders Report.   3.375 g 100 mL/hr over 30 Minutes Intravenous  Once 04/02/23 1908 04/02/23 2114   04/02/23 1000  sulfamethoxazole-trimethoprim (BACTRIM DS) 800-160 MG per tablet 1 tablet  Status:  Discontinued        1 tablet Oral Daily 04/01/23 0004 04/01/23 2023   04/02/23 1000  cephALEXin (KEFLEX) capsule 500 mg  Status:  Discontinued        500 mg Oral Every 12 hours 04/02/23 0756 04/02/23 0756   04/02/23 1000  cephALEXin (KEFLEX) capsule 500 mg  Status:  Discontinued        500 mg Oral Every 12 hours 04/02/23 0756 04/02/23 1908   04/01/23 2200  sulfamethoxazole-trimethoprim (BACTRIM DS) 800-160 MG per tablet 1 tablet  Status:  Discontinued        1 tablet Oral Every 12 hours 04/01/23 2023 04/01/23 2023   04/01/23 2200  sulfamethoxazole-trimethoprim (BACTRIM DS) 800-160 MG per tablet 1 tablet  Status:  Discontinued        1 tablet Oral Every 12 hours 04/01/23 2023 04/02/23 0756        Assessment/Plan: s/p Procedure(s): INCISION AND DRAINAGE,  LEFT GLUTEAL ABSCESS (N/A)  Continue wound care and antibiotics I discussed with the patient's family We will continue to follow  LOS: 1 day    Abigail Miyamoto MD 04/03/2023

## 2023-04-03 NOTE — Transfer of Care (Signed)
Immediate Anesthesia Transfer of Care Note  Patient: Jay Tran  Procedure(s) Performed: INCISION AND DRAINAGE,  LEFT GLUTEAL ABSCESS  Patient Location: PACU  Anesthesia Type:General  Level of Consciousness: awake and alert   Airway & Oxygen Therapy: Patient Spontanous Breathing and Patient connected to nasal cannula oxygen  Post-op Assessment: Report given to RN and Post -op Vital signs reviewed and stable  Post vital signs: Reviewed and stable  Last Vitals:  Vitals Value Taken Time  BP 146/66 04/03/23 0115  Temp 36.7 C 04/03/23 0040  Pulse 86 04/03/23 0124  Resp 26 04/03/23 0124  SpO2 97 % 04/03/23 0124  Vitals shown include unvalidated device data.  Last Pain:  Vitals:   04/03/23 0110  TempSrc:   PainSc: 0-No pain         Complications: No notable events documented.

## 2023-04-03 NOTE — Progress Notes (Addendum)
Transported patient to [facility], with assistance from [employer]. Patients wife and daughter accompanied [place] to his room. Patient tolerated transport well. Upon arrival of patients primary nurse Minerva Areola to the room, I assisted him to turn the patient so that he could visualize the wound. Patient had a small soft bowel movement that continued as he coughed. I removed his mesh underwear and the abd pad from the wound. NT Anip at bedside to assist with cleaning patient. Eric  RN, left to retrieve wound care supplies. We cleaned patient and Minerva Areola replaced the wound packing and placed a dressing over the wound. Inquired if I could be of further assistance or offer any other information, Minerva Areola denied any further needs.  Patient in no distress when I exited room. Outside of the room a gave the patients wife and daughter directions to return to their cars when they were ready. Both were appreciative of care provided. No questions, or concerns voiced.

## 2023-04-03 NOTE — Anesthesia Postprocedure Evaluation (Signed)
Anesthesia Post Note  Patient: Jay Tran  Procedure(s) Performed: INCISION AND DRAINAGE,  LEFT GLUTEAL ABSCESS     Patient location during evaluation: PACU Anesthesia Type: General Level of consciousness: sedated and patient cooperative (remains disoriented, unchanged from pre-op) Pain management: pain level controlled Vital Signs Assessment: post-procedure vital signs reviewed and stable Respiratory status: spontaneous breathing, nonlabored ventilation, respiratory function stable and patient connected to nasal cannula oxygen Cardiovascular status: blood pressure returned to baseline and stable Postop Assessment: no apparent nausea or vomiting Anesthetic complications: no   No notable events documented.  Last Vitals:  Vitals:   04/03/23 0055 04/03/23 0110  BP: 134/61 (!) 146/66  Pulse: 90 87  Resp: (!) 28 (!) 22  Temp:    SpO2: 98% 96%    Last Pain:  Vitals:   04/03/23 0110  TempSrc:   PainSc: 0-No pain                 Rieley Khalsa,E. Renesme Kerrigan

## 2023-04-03 NOTE — Evaluation (Signed)
Occupational Therapy Evaluation Patient Details Name: Jay Tran MRN: [id number] DOB: 1934-12-13 Today's Date: 04/03/2023   History of Present Illness Pt is an 89 y.o. male who presented to the ED via EMS for home with primary c/o generalized weakness. He is currently on antibiotic for UTI. Recent hospitalization in March 2024 following a fall at home. Pt d/c'd from that hospital stay to SNF for rehab, returned home and was receiving HH. PMH: BPH, chronic back pain, OA, HTN, gait instability, GERD, UTI   Clinical Impression   Patient admitted for the diagnosis above.  PTA he was living at home with his spouse.  He had just discharged from a SNF due to a fall, and was receiving HH OT and PT.  Patient was initially able to walk household distances with RW and supervision.  The patient was needing Min A for ADL completion from a sit to stand level.  Currently he is Max A for ADL completion at bedlevel, and is very close to +2 for simple transfers.  Spouse is unable to provide 24 hour Max A, and so the patient will benefit from continued inpatient follow up therapy, <3 hours/day.  The spouse is hopeful he can increase his status in the acute setting and transition back home.  OT is indicated in the acute setting to address deficits.          Recommendations for follow up therapy are one component of a multi-disciplinary discharge planning process, led by the attending physician.  Recommendations may be updated based on patient status, additional functional criteria and insurance authorization.   Assistance Recommended at Discharge Frequent or constant Supervision/Assistance  Patient can return home with the following Assist for transportation;Assistance with cooking/housework;A lot of help with walking and/or transfers;A lot of help with bathing/dressing/bathroom;Direct supervision/assist for medications management;Direct supervision/assist for financial management;Help with stairs or ramp for  entrance    Functional Status Assessment  Patient has had a recent decline in their functional status and demonstrates the ability to make significant improvements in function in a reasonable and predictable amount of time.  Equipment Recommendations  None recommended by OT    Recommendations for Other Services       Precautions / Restrictions Precautions Precautions: Fall Restrictions Weight Bearing Restrictions: No      Mobility Bed Mobility Overal bed mobility: Needs Assistance Bed Mobility: Rolling, Sidelying to Sit Rolling: Mod assist Sidelying to sit: Max assist         Patient Response: Cooperative  Transfers Overall transfer level: Needs assistance   Transfers: Bed to chair/wheelchair/BSC     Squat pivot transfers: Max assist              Balance Overall balance assessment: Needs assistance Sitting-balance support: Bilateral upper extremity supported, Feet supported Sitting balance-Leahy Scale: Poor   Postural control: Posterior lean, Right lateral lean                                 ADL either performed or assessed with clinical judgement   ADL Overall ADL's : Needs assistance/impaired Eating/Feeding: Minimal assistance;Bed level   Grooming: Wash/dry hands;Wash/dry face;Minimal assistance;Bed level   Upper Body Bathing: Moderate assistance;Bed level   Lower Body Bathing: Maximal assistance;Bed level   Upper Body Dressing : Moderate assistance;Sitting   Lower Body Dressing: Maximal assistance;Bed level   Toilet Transfer: Maximal assistance;Squat-pivot;BSC/3in1   Toileting- Clothing Manipulation and Hygiene: Total assistance;Bed level  Vision Baseline Vision/History: 2 Legally blind;1 Wears glasses Patient Visual Report: No change from baseline       Perception     Praxis      Pertinent Vitals/Pain Pain Assessment Pain Assessment: Faces Faces Pain Scale: Hurts even more Pain Location: L  shoulder and bottom post sx. Pain Descriptors / Indicators: Grimacing, Guarding, Discomfort Pain Intervention(s): Monitored during session     Hand Dominance Right   Extremity/Trunk Assessment Upper Extremity Assessment Upper Extremity Assessment: Generalized weakness;LUE deficits/detail LUE Deficits / Details: RCT LUE Sensation: WNL   Lower Extremity Assessment Lower Extremity Assessment: Defer to PT evaluation   Cervical / Trunk Assessment Cervical / Trunk Assessment: Kyphotic   Communication Communication Communication: HOH   Cognition Arousal/Alertness: Lethargic Behavior During Therapy: Flat affect Overall Cognitive Status: Impaired/Different from baseline Area of Impairment: Orientation, Attention, Memory, Following commands, Awareness, Problem solving                 Orientation Level: Person, Place, Situation Current Attention Level: Sustained Memory: Decreased short-term memory Following Commands: Follows one step commands with increased time   Awareness: Emergent Problem Solving: Slow processing, Requires verbal cues       General Comments   VSS on RA    Exercises     Shoulder Instructions      Home Living Family/patient expects to be discharged to:: Private residence Living Arrangements: Spouse/significant other Available Help at Discharge: Family;Available 24 hours/day Type of Home: House Home Access: Stairs to enter [employer] of Steps: 4 Entrance Stairs-Rails: Right Home Layout: One level     Bathroom Shower/Tub: Chief Strategy Officer: Standard Bathroom Accessibility: Yes How Accessible: Accessible via walker Home Equipment: Rolling Walker (2 wheels);Rollator (4 wheels);Cane - single point;Shower seat;Grab bars - tub/shower   Additional Comments: Active with HHPT at time of admission.      Prior Functioning/Environment Prior Level of Function : History of Falls (last six months)             Mobility  Comments: pt ambulates with use of RW, multiple falls in the last year ADLs Comments: No longer dirves, wife assists with Meds.  Setup for ADL.        OT Problem List: Decreased strength;Decreased activity tolerance;Impaired balance (sitting and/or standing);Decreased safety awareness;Decreased cognition;Pain      OT Treatment/Interventions: Therapeutic exercise;Therapeutic activities;Cognitive remediation/compensation;Patient/family education;DME and/or AE instruction;Balance training    OT Goals(Current goals can be found in the care plan section) Acute Rehab OT Goals Patient Stated Goal: Spouse is hoping he can return home OT Goal Formulation: With patient Time For Goal Achievement: 04/17/23 Potential to Achieve Goals: Good ADL Goals Pt Will Perform Grooming: with supervision;sitting Pt Will Perform Upper Body Dressing: with supervision;sitting Pt Will Perform Lower Body Dressing: with min assist;sit to/from stand Pt Will Transfer to Toilet: with min assist;bedside commode Pt/caregiver will Perform Home Exercise Program: Increased strength;Both right and left upper extremity;With theraband;With Supervision  OT Frequency: Min 2X/week    Co-evaluation              AM-PAC OT "6 Clicks" Daily Activity     Outcome Measure Help from another person eating meals?: A Little Help from another person taking care of personal grooming?: A Little Help from another person toileting, which includes using toliet, bedpan, or urinal?: Total Help from another person bathing (including washing, rinsing, drying)?: A Lot Help from another person to put on and taking off regular upper body clothing?: A Lot Help from  another person to put on and taking off regular lower body clothing?: A Lot 6 Click Score: 13   End of Session Equipment Utilized During Treatment: Gait belt;Oxygen Nurse Communication: Mobility status  Activity Tolerance: Patient limited by lethargy Patient left: in chair;with  call bell/phone within reach;with chair alarm set  OT Visit Diagnosis: Unsteadiness on feet (R26.81);Muscle weakness (generalized) (M62.81);History of falling (Z91.81);Other symptoms and signs involving cognitive function                Time: 2707-8675 OT Time Calculation (min): 23 min Charges:  OT General Charges $OT Visit: 1 Visit OT Evaluation $OT Eval Moderate Complexity: 1 Mod OT Treatments $Therapeutic Activity: 8-22 mins  04/03/2023  RP, OTR/L  Acute Rehabilitation Services  Office:  [phone]   Suzanna Obey 04/03/2023, 8:50 AM

## 2023-04-03 NOTE — TOC Initial Note (Addendum)
Transition of Care [facility]) - Initial/Assessment Note    Patient Details  Name: Jay Tran MRN: [id number] Date of Birth: [birth year]  Transition of Care [facility]) CM/SW Contact:    Lorri Frederick, LCSW Phone Number: 04/03/2023, 1:52 PM  Clinical Narrative:    Pt asleep when CSW entered, wife in room.  Did not wake pt.  Discussed DC plan with wife.  Pt at [facility] last month, wife agreeable to SNF, would like return to [facility] if possible.  Pt from home with wife, [facility] still active.    CSW sent referral to [facility] and reached out to Edgar.              1400: [facility] does offer bed, wife informed.     Expected Discharge Plan: Skilled Nursing Facility Barriers to Discharge: Continued Medical Work up, SNF Pending bed offer   Patient Goals and CMS Choice     Choice offered to / list presented to : Spouse      Expected Discharge Plan and Services In-house Referral: Clinical Social Work   Post Acute Care Choice: Skilled Nursing Facility Living arrangements for the past 2 months: Single Family Home                                      Prior Living Arrangements/Services Living arrangements for the past 2 months: Single Family Home Lives with:: Spouse Patient language and need for interpreter reviewed:: No        Need for Family Participation in Patient Care: Yes (Comment) Care giver support system in place?: Yes (comment) Current home services: Home OT, Home PT [facility]) Criminal Activity/Legal Involvement Pertinent to Current Situation/Hospitalization: No - Comment as needed  Activities of Daily Living Home Assistive Devices/Equipment: Environmental consultant (specify type), Shower chair with back, Eyeglasses, Hearing aid, Bedside commode/3-in-1 ADL Screening (condition at time of admission) Patient's cognitive ability adequate to safely complete daily activities?: No Is the patient deaf or have difficulty hearing?: Yes Does the patient have difficulty seeing, even when  wearing glasses/contacts?: No Does the patient have difficulty concentrating, remembering, or making decisions?: Yes Patient able to express need for assistance with ADLs?: No Does the patient have difficulty dressing or bathing?: No Independently performs ADLs?: No Communication: Independent Dressing (OT): Needs assistance Is this a change from baseline?: Change from baseline, expected to last >3 days Grooming: Needs assistance Is this a change from baseline?: Pre-admission baseline Feeding: Needs assistance Is this a change from baseline?: Pre-admission baseline Bathing: Needs assistance Is this a change from baseline?: Change from baseline, expected to last <3 days Toileting: Needs assistance Is this a change from baseline?: Change from baseline, expected to last >3days In/Out Bed: Needs assistance Is this a change from baseline?: Change from baseline, expected to last >3 days Walks in Home: Needs assistance Is this a change from baseline?: Change from baseline, expected to last >3 days Does the patient have difficulty walking or climbing stairs?: Yes Weakness of Legs: Both Weakness of Arms/Hands: Both  Permission Sought/Granted                  Emotional Assessment Appearance:: Appears stated age Attitude/Demeanor/Rapport: Unable to Assess Affect (typically observed): Unable to Assess        Admission diagnosis:  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] Perianal abscess [K61.0] Urinary retention [R33.9] Weakness [R53.1] Encephalopathy [G93.40] Acute cystitis without hematuria [N30.00] Patient Active Problem List  Diagnosis Date Noted   Perianal abscess 04/02/2023   Hyponatremia 04/02/2023   Delirium 04/01/2023   Weakness 04/01/2023   Acute cystitis without hematuria 02/23/2023   Syncope and collapse 02/22/2023   AKI (acute kidney injury) 02/22/2023   Unilateral primary osteoarthritis, right hip 03/30/2022   Prostatic hypertrophy 11/20/2020   Internal carotid artery  stenosis 02/05/2020   Essential hypertension 02/05/2020   Benign prostatic hyperplasia with urinary retention 02/05/2020   GERD (gastroesophageal reflux disease) 02/05/2020   Chronic back pain 02/05/2020   Vision loss of right eye 02/04/2020   Wound drainage 08/20/2018   Chronic pain of both shoulders 02/22/2017   S/P lumbar spinal fusion 04/15/2015   HNP (herniated nucleus pulposus), lumbar 10/26/2012    Class: Diagnosis of   PCP:  Kaleen Mask, MD Pharmacy:   [facility] [id number] [place]), [place] - [address] [place] [address] Phone: [phone] Fax: [phone]     Social Determinants of Health (SDOH) Social History: SDOH Screenings   Food Insecurity: No Food Insecurity (04/02/2023)  Housing: Low Risk  (04/02/2023)  Transportation Needs: No Transportation Needs (04/02/2023)  Utilities: Not At Risk (04/02/2023)  Tobacco Use: Medium Risk (04/03/2023)   SDOH Interventions:     Readmission Risk Interventions     No data to display

## 2023-04-03 NOTE — Anesthesia Procedure Notes (Signed)
Procedure Name: Intubation Date/Time: 04/03/2023 11:47 PM  Performed by: Laruth Bouchard., CRNAPre-anesthesia Checklist: Patient identified, Emergency Drugs available, Suction available, Patient being monitored and Timeout performed Patient Re-evaluated:Patient Re-evaluated prior to induction Oxygen Delivery Method: Circle system utilized Preoxygenation: Pre-oxygenation with 100% oxygen Induction Type: IV induction, Rapid sequence and Cricoid Pressure applied Laryngoscope Size: Mac and 4 Grade View: Grade I Tube type: Oral Tube size: 7.5 mm Number of attempts: 1 Airway Equipment and Method: Stylet Placement Confirmation: ETT inserted through vocal cords under direct vision, positive ETCO2 and breath sounds checked- equal and bilateral Secured at: 22 cm Tube secured with: Tape Dental Injury: Teeth and Oropharynx as per pre-operative assessment

## 2023-04-04 DIAGNOSIS — K610 Anal abscess: Secondary | ICD-10-CM | POA: Diagnosis not present

## 2023-04-04 LAB — CBC
HCT: 32.6 % — ABNORMAL LOW (ref 39.0–52.0)
Hemoglobin: 11.2 g/dL — ABNORMAL LOW (ref 13.0–17.0)
MCH: 33.1 pg (ref 26.0–34.0)
MCHC: 34.4 g/dL (ref 30.0–36.0)
MCV: 96.4 fL (ref 80.0–100.0)
Platelets: 296 10*3/uL (ref 150–400)
RBC: 3.38 MIL/uL — ABNORMAL LOW (ref 4.22–5.81)
RDW: 13.5 % (ref 11.5–15.5)
WBC: 11.1 10*3/uL — ABNORMAL HIGH (ref 4.0–10.5)
nRBC: 0 % (ref 0.0–0.2)

## 2023-04-04 LAB — AEROBIC/ANAEROBIC CULTURE W GRAM STAIN (SURGICAL/DEEP WOUND)

## 2023-04-04 LAB — COMPREHENSIVE METABOLIC PANEL
ALT: 6 U/L (ref 0–44)
AST: 41 U/L (ref 15–41)
Albumin: 2.6 g/dL — ABNORMAL LOW (ref 3.5–5.0)
Alkaline Phosphatase: 43 U/L (ref 38–126)
Anion gap: 6 (ref 5–15)
BUN: 15 mg/dL (ref 8–23)
CO2: 26 mmol/L (ref 22–32)
Calcium: 8.8 mg/dL — ABNORMAL LOW (ref 8.9–10.3)
Chloride: 99 mmol/L (ref 98–111)
Creatinine, Ser: 0.79 mg/dL (ref 0.61–1.24)
GFR, Estimated: >60 mL/min (ref >60)
Glucose, Bld: 107 mg/dL — ABNORMAL HIGH (ref 70–99)
Potassium: 3.8 mmol/L (ref 3.5–5.1)
Sodium: 131 mmol/L — ABNORMAL LOW (ref 135–145)
Total Bilirubin: 1 mg/dL (ref 0.3–1.2)
Total Protein: 5.8 g/dL — ABNORMAL LOW (ref 6.5–8.1)

## 2023-04-04 LAB — CULTURE, BLOOD (ROUTINE X 2): Culture: NO GROWTH

## 2023-04-04 LAB — MAGNESIUM: Magnesium: 2.2 mg/dL (ref 1.7–2.4)

## 2023-04-04 LAB — PHOSPHORUS: Phosphorus: 3.6 mg/dL (ref 2.5–4.6)

## 2023-04-04 MED ORDER — BETHANECHOL CHLORIDE 10 MG PO TABS
10.0000 mg | ORAL_TABLET | Freq: Four times a day (QID) | ORAL | Status: DC
  Administered 2023-04-04 – 2023-04-06 (×7): 10 mg via ORAL
  Filled 2023-04-04 (×7): qty 1

## 2023-04-04 MED ORDER — MELATONIN 3 MG PO TABS
3.0000 mg | ORAL_TABLET | Freq: Every evening | ORAL | Status: AC | PRN
  Administered 2023-04-05: 3 mg via ORAL
  Filled 2023-04-04: qty 1

## 2023-04-04 MED ORDER — ENOXAPARIN SODIUM 40 MG/0.4ML IJ SOSY
40.0000 mg | PREFILLED_SYRINGE | INTRAMUSCULAR | Status: DC
  Administered 2023-04-04 – 2023-04-06 (×3): 40 mg via SUBCUTANEOUS
  Filled 2023-04-04 (×3): qty 0.4

## 2023-04-04 NOTE — Progress Notes (Signed)
Jay Tran  ZOX:[id number] DOB: 07/11/1934 DOA: 03/31/2023 PCP: Kaleen Mask, MD    Brief Narrative:  89 year old with a history of BPH with urinary retention, chronic pain status post lumbar fusion with spinal cord stimulator, HTN, carotid artery disease, DVT, HLD, and CVA who was brought to the ER via EMS from his private home with complaints of generalized weakness x 1 week and uncontrolled blood pressure at 224/98.  He had been diagnosed with a UTI 2 days prior to his presentation and was being treated with a course of Bactrim through his PCP.  He had recently been hospitalized for a UTI following which he had a rehab stay in a local SNF before ultimately being discharged home.  CT head in the ER was unrevealing.  WBC was elevated at 18 w/o clear source initially, and UA was unremarkable.  COVID and influenza testing was negative.  CXR was without focal infiltrate.  While boarding in the ER the patient developed intermittent confusion.  MRI of the brain revealed no acute abnormality.  Abdominal ultrasound revealed no acute cholecystitis.  Urine culture from 4/12 revealed no growth. MRI lumbar spine was obtained given weakness to rule out spinal complication/infection, and was negative.   In the afternoon on 4/14 the patient was able to communicate c/o abdominal pain to the ED staff. A CT abdom/pelvis was obtained, and revealed a peri-rectal gluteal abscess. Gen Surg was consulted, and conservative attempts to address the abscess failed. The patient was therefore taken to the OR for I&D in the early morning hours of 4/15, and subsequently admitted.   Consults: Gen Surgery   Goals of Care:  Code Status: Full Code   DVT prophylaxis: Lovenox  Interim Hx: No acute events recorded overnight.  Afebrile.  Vital signs stable.  WBC improving.  Significantly more alert and conversant today.  Assessment & Plan:  Left gluteal abscess  Status post operative I&D by General Surgery 4/15  a.m. - continue empiric antibiotic - monitor cultures -wound care per surgery  Recurrent urinary retention Patient was found to be retaining >500 cc of urine while in the ER and a Foley catheter had to be placed - UA at that time was unremarkable -begin voiding trials 4/17 - mobilize  Acute delirium - toxic metabolic encephalopathy  Likely sundowning related to unfamiliar environment as well as metabolic encephalopathy related to smoldering infection/gluteal abscess - free T4 is normal - TSH normal -UA unrevealing - CT and MRI head unrevealing - folic acid and ammonia level levels are normal - B12 is low and being replaced -RPR negative -mental status now steadily improving  B12 deficiency  SQ supplementation initiated 4/14 - change to oral dosing at d/c, and recheck level in 6-8 weeks   Hyponatremia Likely simply due to volume depletion with poor intake -continue to gently hydrate -improving  Severe generalized weakness Due to deconditioning in the setting of a smoldering infection - no focal neurologic findings -therapy is suggesting SNF rehab stay and I agree  Family Communication: Spoke with wife at bedside Disposition: Will need SNF rehab stay when medically stable for discharge  Objective: Blood pressure (!) 119/48, pulse 62, temperature 98.7 F (37.1 C), resp. rate 17, height  (1.778 m), weight 85.4 kg, SpO2 98 %.  Intake/Output Summary (Last 24 hours) at 04/04/2023 1006 Last data filed at 04/04/2023 0500 Gross per 24 hour  Intake 200 ml  Output 1852 ml  Net -1652 ml    Filed Weights   03/31/23  1719 04/03/23 0158  Weight: 86.2 kg 85.4 kg    Examination: General: No acute respiratory distress Lungs: Clear to auscultation bilaterally without wheezing Cardiovascular: Regular rate and rhythm without murmur Abdomen: Nontender, nondistended, soft, bowel sounds positive, no rebound Extremities: No edema bilateral lower extremities with  CBC: Recent Labs  Lab  04/01/23 1355 04/02/23 1606 04/03/23 0220 04/04/23 0150  WBC 16.6* 16.8* 16.5* 11.1*  NEUTROABS 14.5* 13.7*  --   --   HGB 13.4 13.1 11.9* 11.2*  HCT 38.8* 37.5* 35.6* 32.6*  MCV 96.8 95.7 97.8 96.4  PLT 307 311 302 296    Basic Metabolic Panel: Recent Labs  Lab 04/02/23 1606 04/03/23 0220 04/04/23 0150  NA 130* 130* 131*  K 3.9 3.8 3.8  CL 93* 96* 99  CO2 GLUCOSE 149* 136* 107*  BUN 24* 20 15  CREATININE 1.38* 1.07 0.79  CALCIUM 9.8 9.2 8.8*  MG  --   --  2.2  PHOS  --   --  3.6    GFR: Estimated Creatinine Clearance: 68.4 mL/min (by C-G formula based on SCr of 0.79 mg/dL).   Scheduled Meds:  aspirin EC  81 mg Oral q AM   atorvastatin  40 mg Oral QHS   Chlorhexidine Gluconate Cloth  6 each Topical Daily   cyanocobalamin  1,000 mcg Subcutaneous Daily   feeding supplement  237 mL Oral BID BM   finasteride  5 mg Oral QHS   heparin  5,000 Units Subcutaneous Q8H   lidocaine  1 patch Transdermal Q24H   tamsulosin  0.4 mg Oral QHS     LOS: 2 days   Lonia Blood, MD [facility] Office  [phone] Pager - Text Page per Loretha Stapler  If 7PM-7AM, please contact night-coverage per Amion 04/04/2023, 10:06 AM

## 2023-04-04 NOTE — Progress Notes (Addendum)
2 Days Post-Op   Subjective/Chief Complaint: Complains of some musculoskeletal pain and denies pain from procedure. Wife at bedside  Objective: Vital signs in last 24 hours: Temp:  [98.3 F (36.8 C)-98.7 F (37.1 C)] 98.7 F (37.1 C) (04/16 0719) Pulse Rate:  [62-79] 62 (04/16 0719) Resp:  [15-17] 17 (04/16 0719) BP: (106-159)/(39-73) 119/48 (04/16 0719) SpO2:  [94 %-99 %] 98 % (04/16 0719) Last BM Date : 04/03/23  Intake/Output from previous day: 04/15 0701 - 04/16 0700 In: 200 [P.O.:200] Out: 1852 [Urine:1850; Stool:2] Intake/Output this shift: Total I/O In: 480 [P.O.:480] Out: -   Exam: Awake and answers questions Perineal wound clean and packed with penrose in place and no gross purulence. No surrounding erythema or induration  Lab Results:  Recent Labs    04/03/23 0220 04/04/23 0150  WBC 16.5* 11.1*  HGB 11.9* 11.2*  HCT 35.6* 32.6*  PLT 302 296    BMET Recent Labs    04/03/23 0220 04/04/23 0150  NA 130* 131*  K 3.8 3.8  CL 96* 99  CO2 23 26  GLUCOSE 136* 107*  BUN 20 15  CREATININE 1.07 0.79  CALCIUM 9.2 8.8*    PT/INR No results for input(s): "LABPROT", "INR" in the last 72 hours. ABG No results for input(s): "PHART", "HCO3" in the last 72 hours.  Invalid input(s): "PCO2", "PO2"  Studies/Results: CT ABDOMEN PELVIS W CONTRAST  Result Date: 04/02/2023 CLINICAL DATA:  Left lower quadrant pain. EXAM: CT ABDOMEN AND PELVIS WITH CONTRAST TECHNIQUE: Multidetector CT imaging of the abdomen and pelvis was performed using the standard protocol following bolus administration of intravenous contrast. RADIATION DOSE REDUCTION: This exam was performed according to the departmental dose-optimization program which includes automated exposure control, adjustment of the mA and/or kV according to patient size and/or use of iterative reconstruction technique. CONTRAST:  75mL OMNIPAQUE IOHEXOL 350 MG/ML SOLN COMPARISON:  02/22/2023 FINDINGS: Lower Chest: No acute  findings. Hepatobiliary: No hepatic masses identified. A few tiny calcified gallstones are again seen, however there is no evidence of cholecystitis or biliary ductal dilatation. Pancreas:  No mass or inflammatory changes. Spleen: Within normal limits in size and appearance. Adrenals/Urinary Tract: No suspicious masses identified. 2 mm calculus seen in midpole of right kidney. No evidence of ureteral calculi or hydronephrosis. Foley catheter is seen in the urinary bladder. Stomach/Bowel: Diverticulosis is seen mainly involving the descending and sigmoid colon, however there is no evidence of diverticulitis. 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 Rim enhancing fluid collection is seen adjacent to the gluteal crease which measures 2.0 x 1.1 cm, consistent with abscess. Vascular/Lymphatic: No pathologically enlarged lymph nodes. No acute vascular findings. Aortic atherosclerotic calcification incidentally noted. Reproductive:  No mass or other significant abnormality. Other:  None. Musculoskeletal: No suspicious bone lesions identified. Lumbar spine fusion hardware again noted. Neurostimulator device again seen with leads in the thoracic spinal canal. IMPRESSION: New complex left perianal fistula, with 2 cm perianal abscess adjacent to the gluteal crease. Colonic diverticulosis, without radiographic evidence of diverticulitis. Cholelithiasis. No radiographic evidence of cholecystitis. Tiny right renal calculus. No evidence of ureteral calculi or hydronephrosis. Aortic Atherosclerosis (ICD10-I70.0). Electronically Signed   By: Danae Orleans M.D.   On: 04/02/2023 18:25   MR Lumbar Spine W Wo Contrast  Result Date: 04/02/2023 CLINICAL DATA:  Weakness. EXAM: MRI LUMBAR SPINE WITHOUT AND WITH CONTRAST TECHNIQUE: Multiplanar and multiecho pulse sequences of the lumbar spine were obtained without and with intravenous contrast.  CONTRAST:  8.56mL GADAVIST GADOBUTROL 1 MMOL/ML  IV SOLN COMPARISON:  CT abdomen pelvis dated February 22, 2023. MRI lumbar spine dated August 20, 2018. FINDINGS: Segmentation:  Standard. Alignment:  No significant listhesis. Vertebrae: No fracture, evidence of discitis, or bone lesion. Prior L1-L5 PLIF status post L4 and L5 pedicle screw removal. Chronic degenerative fatty marrow endplate changes at T12-L1. Conus medullaris and cauda equina: Conus extends to the L1 level. Conus and cauda equina appear normal. Abnormal intrathecal enhancement. Paraspinal and other soft tissues: Presacral edema. No paravertebral or paraspinous inflammatory change. Disc levels: T12-L1: Chronic severe disc height loss with bulky circumferential disc osteophyte complex, similar to most recent CT, progressed since prior MRI from 2021. Mild bilateral facet arthropathy. Mild-to-moderate spinal canal stenosis. Moderate right and mild left lateral recess stenosis. Severe right and mild-to-moderate left neuroforaminal stenosis. L1-L2: Prior posterior decompression and PLIF. No spinal canal stenosis. Unchanged residual mild bilateral neuroforaminal stenosis due to bony hypertrophy. L2-L3: Prior posterior decompression and PLIF. Unchanged mild left paracentral endplate spurring. No residual stenosis. L3-L4: Prior posterior decompression and PLIF. No residual stenosis. L4-L5: Prior posterior decompression and interbody fusion. Unchanged residual mild left neuroforaminal stenosis due to bony hypertrophy. No spinal canal or right neuroforaminal stenosis. L5-S1: Mild disc bulging and moderate to severe bilateral facet arthropathy. Unchanged moderate left and mild right neuroforaminal stenosis. No spinal canal stenosis. IMPRESSION: 1. Prior L1-L5 PLIF status post L4 and L5 pedicle screw removal. No acute abnormality. 2. Similar adjacent segment disease at T12-L1 with mild-to-moderate spinal canal stenosis and severe right neuroforaminal stenosis. 3. Similar adjacent segment disease at L5-S1 with  advanced facet arthropathy and moderate left neuroforaminal stenosis at L5-S1. Electronically Signed   By: Obie Dredge M.D.   On: 04/02/2023 14:28    Anti-infectives: Anti-infectives (From admission, onward)    Start     Dose/Rate Route Frequency Ordered Stop   04/03/23 0200  piperacillin-tazobactam (ZOSYN) IVPB 3.375 g       See Hyperspace for full Linked Orders Report.   3.375 g 12.5 mL/hr over 240 Minutes Intravenous Every 8 hours 04/02/23 1908     04/02/23 1915  piperacillin-tazobactam (ZOSYN) IVPB 3.375 g       See Hyperspace for full Linked Orders Report.   3.375 g 100 mL/hr over 30 Minutes Intravenous  Once 04/02/23 1908 04/02/23 2114   04/02/23 1000  sulfamethoxazole-trimethoprim (BACTRIM DS) 800-160 MG per tablet 1 tablet  Status:  Discontinued        1 tablet Oral Daily 04/01/23 0004 04/01/23 2023   04/02/23 1000  cephALEXin (KEFLEX) capsule 500 mg  Status:  Discontinued        500 mg Oral Every 12 hours 04/02/23 0756 04/02/23 0756   04/02/23 1000  cephALEXin (KEFLEX) capsule 500 mg  Status:  Discontinued        500 mg Oral Every 12 hours 04/02/23 0756 04/02/23 1908   04/01/23 2200  sulfamethoxazole-trimethoprim (BACTRIM DS) 800-160 MG per tablet 1 tablet  Status:  Discontinued        1 tablet Oral Every 12 hours 04/01/23 2023 04/01/23 2023   04/01/23 2200  sulfamethoxazole-trimethoprim (BACTRIM DS) 800-160 MG per tablet 1 tablet  Status:  Discontinued        1 tablet Oral Every 12 hours 04/01/23 2023 04/02/23 0756       Assessment/Plan: Left gluteal abscess POD2 s/p  incision and drainage of left gluteal abscess Dr. Sheliah Hatch 4/14  WBC improving. Continue wound care and antibiotics. Penrose drain will  need to remain on discharge but can discontinue packing  FEN:reg AV:WUJWJ VTE: lovenox  Per primary Recurrent UR Acute delirium B12 deficiency Severe generalized weakness   LOS: 2 days   Eric Form, [facility] 04/04/2023, 11:49  AM Please see Amion for pager number during day hours 7:00am-4:30pm

## 2023-04-05 DIAGNOSIS — K610 Anal abscess: Secondary | ICD-10-CM | POA: Diagnosis not present

## 2023-04-05 DIAGNOSIS — E871 Hypo-osmolality and hyponatremia: Secondary | ICD-10-CM

## 2023-04-05 LAB — CBC
HCT: 34.8 % — ABNORMAL LOW (ref 39.0–52.0)
Hemoglobin: 11.9 g/dL — ABNORMAL LOW (ref 13.0–17.0)
MCH: 33 pg (ref 26.0–34.0)
MCHC: 34.2 g/dL (ref 30.0–36.0)
MCV: 96.4 fL (ref 80.0–100.0)
Platelets: 341 10*3/uL (ref 150–400)
RBC: 3.61 MIL/uL — ABNORMAL LOW (ref 4.22–5.81)
RDW: 13 % (ref 11.5–15.5)
WBC: 10.4 10*3/uL (ref 4.0–10.5)
nRBC: 0 % (ref 0.0–0.2)

## 2023-04-05 LAB — AEROBIC/ANAEROBIC CULTURE W GRAM STAIN (SURGICAL/DEEP WOUND)

## 2023-04-05 LAB — BASIC METABOLIC PANEL
Anion gap: 4 — ABNORMAL LOW (ref 5–15)
BUN: 11 mg/dL (ref 8–23)
CO2: 26 mmol/L (ref 22–32)
Calcium: 9 mg/dL (ref 8.9–10.3)
Chloride: 100 mmol/L (ref 98–111)
Creatinine, Ser: 0.66 mg/dL (ref 0.61–1.24)
GFR, Estimated: >60 mL/min (ref >60)
Glucose, Bld: 132 mg/dL — ABNORMAL HIGH (ref 70–99)
Potassium: 4.3 mmol/L (ref 3.5–5.1)
Sodium: 130 mmol/L — ABNORMAL LOW (ref 135–145)

## 2023-04-05 MED ORDER — MELATONIN 3 MG PO TABS
3.0000 mg | ORAL_TABLET | Freq: Every evening | ORAL | Status: DC | PRN
  Administered 2023-04-05: 3 mg via ORAL
  Filled 2023-04-05: qty 1

## 2023-04-05 MED ORDER — GUAIFENESIN-DM 100-10 MG/5ML PO SYRP: 10.0000 mL | ORAL_SOLUTION | ORAL | Status: DC | PRN

## 2023-04-05 NOTE — Care Management Important Message (Signed)
Important Message  Patient Details  Name: Jay Tran MRN: [id number] Date of Birth: Dec 01, 1934   Medicare Important Message Given:  Yes     Sherilyn Banker 04/05/2023, 11:41 AM

## 2023-04-05 NOTE — Progress Notes (Signed)
Patient's family is requesting that PT writes a note saying that he could benefit from side rails on his bed at the SNF he is going to.

## 2023-04-05 NOTE — Progress Notes (Signed)
Physical Therapy Treatment Patient Details Name: Jay Tran MRN: [id number] DOB: [birth year] Today's Date: 04/05/2023   History of Present Illness Pt is an 89 y.o. male who presented to the ED via EMS for home with primary c/o generalized weakness. He is currently on antibiotic for UTI. Recent hospitalization in March 2024 following a fall at home. Pt d/c'd from that hospital stay to SNF for rehab, returned home and was receiving HH. PMH: BPH, chronic back pain, OA, HTN, gait instability, GERD, UTI    PT Comments    Pt is progressing towards goals. Pt improved significantly from last sesson at Min +2 for sit to stand and transfer from EOB to recliner. Pt was able to WB through the LLE this session from last session with much more muscular control.  Due to pt current level of function, home set up and available assistance at home recommending skilled physical therapy services at a higher level of care and frequency 5x/weekly in order to work on strength, physical function, balance and mobility in order to decrease burden of care, risk for falls and rehospitalization. No signs/symptoms of cardiac/respiratory distress throughout session.    Recommendations for follow up therapy are one component of a multi-disciplinary discharge planning process, led by the attending physician.  Recommendations may be updated based on patient status, additional functional criteria and insurance authorization.  Follow Up Recommendations  Can patient physically be transported by private vehicle: No    Assistance Recommended at Discharge Frequent or constant Supervision/Assistance  Patient can return home with the following Assist for transportation;A lot of help with walking and/or transfers;Assistance with cooking/housework;Help with stairs or ramp for entrance   Equipment Recommendations  Wheelchair (measurements PT)    Recommendations for Other Services       Precautions / Restrictions  Precautions Precautions: Fall Restrictions Weight Bearing Restrictions: No     Mobility  Bed Mobility Overal bed mobility: Needs Assistance Bed Mobility: Rolling, Sidelying to Sit Rolling: Min assist Sidelying to sit: Min assist       General bed mobility comments: Pt improved significantly from last session requiring Min A at the LLE and Min A at the trunk to get trunk to midline to sit EOB Patient Response: Cooperative  Transfers Overall transfer level: Needs assistance Equipment used: Rolling walker (2 wheels) Transfers: Sit to/from Stand, Bed to chair/wheelchair/BSC Sit to Stand: +2 physical assistance, +2 safety/equipment, Min assist   Step pivot transfers: Min assist, +2 physical assistance, +2 safety/equipment       General transfer comment: Pt was +2 for sit to stand from EOB and to manage RW with step pivot transfer to get to recliner. Much improvement from last session. Pt was able to step after initially stepping in place    Ambulation/Gait               General Gait Details: Pt was able to take a couple of steps toward the recliner from EOB with 2 person Min A with Min A navigating the RW and Min A to maintain upright balance due to posterior lean in standing.       Balance Overall balance assessment: Needs assistance Sitting-balance support: Bilateral upper extremity supported Sitting balance-Leahy Scale: Fair Sitting balance - Comments: Pt sat EOB without any overt LOB. Postural control: Posterior lean Standing balance support: Reliant on assistive device for balance, Bilateral upper extremity supported Standing balance-Leahy Scale: Poor Standing balance comment: Pt was Min A +2 due to posterior lean in standing to maintain midline  balance.        Cognition Arousal/Alertness: Awake/alert Behavior During Therapy: WFL for tasks assessed/performed Overall Cognitive Status: Within Functional Limits for tasks assessed               General  Comments General comments (skin integrity, edema, etc.): Pt spouse present and supportive throughout session.      Pertinent Vitals/Pain Pain Assessment Pain Assessment: Faces Faces Pain Scale: Hurts even more Breathing: occasional labored breathing, short period of hyperventilation Negative Vocalization: occasional moan/groan, low speech, negative/disapproving quality Facial Expression: sad, frightened, frown Body Language: tense, distressed pacing, fidgeting Consolability: no need to console PAINAD Score: 4 Pain Location: L shoulder and bottom post sx. Pain Descriptors / Indicators: Grimacing, Guarding, Discomfort Pain Intervention(s): Limited activity within patient's tolerance, Monitored during session     PT Goals (current goals can now be found in the care plan section) Acute Rehab PT Goals Patient Stated Goal: "get my strength back" PT Goal Formulation: With patient/family Time For Goal Achievement: 04/15/23 Potential to Achieve Goals: Good Progress towards PT goals: Progressing toward goals    Frequency    Min 3X/week      PT Plan Current plan remains appropriate       AM-PAC PT "6 Clicks" Mobility   Outcome Measure  Help needed turning from your back to your side while in a flat bed without using bedrails?: A Little Help needed moving from lying on your back to sitting on the side of a flat bed without using bedrails?: A Little Help needed moving to and from a bed to a chair (including a wheelchair)?: A Lot Help needed standing up from a chair using your arms (e.g., wheelchair or bedside chair)?: A Lot Help needed to walk in hospital room?: Total Help needed climbing 3-5 steps with a railing? : Total 6 Click Score: 12    End of Session Equipment Utilized During Treatment: Gait belt Activity Tolerance: Patient limited by pain Patient left: in bed;with call bell/phone within reach;with family/visitor present Nurse Communication: Mobility status PT Visit  Diagnosis: Difficulty in walking, not elsewhere classified (R26.2);Muscle weakness (generalized) (M62.81)     Time: 0981-1914 PT Time Calculation (min) (ACUTE ONLY): 24 min  Charges:  $Therapeutic Activity: 23-37 mins                     Harrel Carina, DPT, CLT  Acute Rehabilitation Services Office: [phone] (Secure chat preferred)    Claudia Desanctis 04/05/2023, 1:19 PM

## 2023-04-05 NOTE — Progress Notes (Signed)
3 Days Post-Op   Subjective/Chief Complaint: No new complaints this am. No significant pain from buttock wound  Wife at bedside  Objective: Vital signs in last 24 hours: Temp:  [98.5 F (36.9 C)-99.3 F (37.4 C)] 99.3 F (37.4 C) (04/17 0527) Pulse Rate:  [71-83] 79 (04/17 0527) Resp:  [15-18] 18 (04/17 0527) BP: (125-177)/(75-81) 150/75 (04/17 0527) SpO2:  [94 %-97 %] 95 % (04/17 0527) Last BM Date : 04/04/23  Intake/Output from previous day: 04/16 0701 - 04/17 0700 In: 1560 [P.O.:1560] Out: 851 [Urine:850; Stool:1] Intake/Output this shift: Total I/O In: 360 [P.O.:360] Out: 300 [Urine:300]  Exam: Awake and answers questions Perineal wound evaluation limited by stool contamination. Penrose in place  Lab Results:  Recent Labs    04/04/23 0150 04/05/23 0133  WBC 11.1* 10.4  HGB 11.2* 11.9*  HCT 32.6* 34.8*  PLT 296 341    BMET Recent Labs    04/04/23 0150 04/05/23 0133  NA 131* 130*  K 3.8 4.3  CL 99 100  CO2 26 26  GLUCOSE 107* 132*  BUN 15 11  CREATININE 0.79 0.66  CALCIUM 8.8* 9.0    PT/INR No results for input(s): "LABPROT", "INR" in the last 72 hours. ABG No results for input(s): "PHART", "HCO3" in the last 72 hours.  Invalid input(s): "PCO2", "PO2"  Studies/Results: No results found.  Anti-infectives: Anti-infectives (From admission, onward)    Start     Dose/Rate Route Frequency Ordered Stop   04/03/23 0200  piperacillin-tazobactam (ZOSYN) IVPB 3.375 g       See Hyperspace for full Linked Orders Report.   3.375 g 12.5 mL/hr over 240 Minutes Intravenous Every 8 hours 04/02/23 1908     04/02/23 1915  piperacillin-tazobactam (ZOSYN) IVPB 3.375 g       See Hyperspace for full Linked Orders Report.   3.375 g 100 mL/hr over 30 Minutes Intravenous  Once 04/02/23 1908 04/02/23 2114   04/02/23 1000  sulfamethoxazole-trimethoprim (BACTRIM DS) 800-160 MG per tablet 1 tablet  Status:  Discontinued        1 tablet Oral Daily 04/01/23 0004  04/01/23 2023   04/02/23 1000  cephALEXin (KEFLEX) capsule 500 mg  Status:  Discontinued        500 mg Oral Every 12 hours 04/02/23 0756 04/02/23 0756   04/02/23 1000  cephALEXin (KEFLEX) capsule 500 mg  Status:  Discontinued        500 mg Oral Every 12 hours 04/02/23 0756 04/02/23 1908   04/01/23 2200  sulfamethoxazole-trimethoprim (BACTRIM DS) 800-160 MG per tablet 1 tablet  Status:  Discontinued        1 tablet Oral Every 12 hours 04/01/23 2023 04/01/23 2023   04/01/23 2200  sulfamethoxazole-trimethoprim (BACTRIM DS) 800-160 MG per tablet 1 tablet  Status:  Discontinued        1 tablet Oral Every 12 hours 04/01/23 2023 04/02/23 0756       Assessment/Plan: Left gluteal abscess POD3 s/p  incision and drainage of left gluteal abscess Dr. Sheliah Hatch 4/14  WBC normal.  Abscess culture with e coli. Sensitive to zosyn.  Continue wound care - discussed with wife at bedside. Continue antibiotics for 4 more days- can transition to PO on discharge. Penrose drain will need to remain on discharge. No wound packing needed  Stable for dc from surgical standpoint  FEN:reg WU:[id number] 4/14>> VTE: lovenox  Per primary Recurrent UR Acute delirium B12 deficiency Severe generalized weakness   LOS: 3 days   Eric Form,  PA-C [facility] 04/05/2023, 10:59 AM Please see Amion for pager number during day hours 7:00am-4:30pm

## 2023-04-05 NOTE — Plan of Care (Signed)
  Problem: Activity: Goal: Risk for activity intolerance will decrease Outcome: Progressing   Problem: Nutrition: Goal: Adequate nutrition will be maintained Outcome: Progressing   

## 2023-04-05 NOTE — Progress Notes (Signed)
PROGRESS NOTE    Jay Tran  ZOX:[id number] DOB: 1934/02/10 DOA: 03/31/2023 PCP: Kaleen Mask, MD   Brief Narrative:  89 year old with a history of BPH with urinary retention, chronic back pain status post lumbar fusion with spinal cord stimulator, HTN, carotid artery disease, DVT, HLD, and unspecified CVA, recent hospitalization for UTI requiring discharge to SNF and subsequent discharge home presented with worsening generalized weakness and uncontrolled blood pressure while being treated on oral Bactrim for UTI as an outpatient.  On presentation, CT of the head was unrevealing; WBC of 18; UA was unremarkable; chest x-ray without any infiltrate and COVID 19 and influenza testing were negative.  While boarding in the ER, patient developed intermittent confusion. developed intermittent confusion. MRI of the brain revealed no acute abnormality. Abdominal ultrasound revealed no acute cholecystitis. MRI lumbar spine was obtained given weakness to rule out spinal complication/infection, and was negative.  In the afternoon of 04/02/2023, patient started to have abdominal pain.  CT of abdomen and pelvis showed perirectal gluteal abscess.  He was started on broad-spectrum antibiotics.  General surgery was consulted: Attempts to address the abscess conservatively failed.  She underwent I&D by general surgery on 04/02/2023.  Assessment & Plan:   Left gluteal abscess -Status post I&D by general surgery on 04/02/2023.  Currently on Zosyn.  OR cultures pending.  Wound care as per general surgery.  Recurrent urinary retention BPH -Foley catheter placed in the ED which has subsequently been removed.  Continue Flomax and finasteride  Acute delirium Acute toxic and metabolic encephalopathy -Possibly from above.  TSH/free T4 normal.  Folic acid and ammonia levels normal. -B12 is low: Being replaced.  RPR negative.  CT and MRI of brain were unrevealing -Still slow to respond but mental status steadily  improving.  Monitor mental status.  Fall precautions.  B12 deficiency -Continue parenteral B12 while in the hospital and switch to oral on discharge.  Outpatient follow-up  Leukocytosis -Resolved  Normocytic anemia -From chronic illnesses.  Hemoglobin currently stable.  Monitor intermittently  Hyponatremia--mild.  Continue to encourage oral intake.  Hyperlipidemia -Continue statin  Generalized weakness -PT recommending SNF placement.  TOC following   DVT prophylaxis: Lovenox Code Status: Full Family Communication: Wife at bedside Disposition Plan: Status is: Inpatient Remains inpatient appropriate because: Of severity of illness.  Need for SNF placement.    Consultants: General surgery  Procedures: None  Antimicrobials: Zosyn   Subjective: Patient seen and examined at bedside.  Slow to respond, poor historian.  Wife present at bedside states that patient had a rough night and did not sleep much.  No fever, vomiting, worsening shortness of breath reported.  Objective: Vitals:   04/04/23 1348 04/04/23 1500 04/04/23 2105 04/05/23 0527  BP:  (!) 177/81 125/76 (!) 150/75  Pulse: 83 71 78 79  Resp: 15  18 18   Temp: 98.6 F (37 C)  98.5 F (36.9 C) 99.3 F (37.4 C)  TempSrc: Oral  Oral Oral  SpO2: 94%  97% 95%  Weight:      Height:        Intake/Output Summary (Last 24 hours) at 04/05/2023 0932 Last data filed at 04/05/2023 0500 Gross per 24 hour  Intake 1080 ml  Output 851 ml  Net 229 ml   Filed Weights   03/31/23 1719 04/03/23 0158  Weight: 86.2 kg 85.4 kg    Examination:  General exam: Appears calm and comfortable.  Looks chronically ill and deconditioned.  Elderly male lying in bed. Respiratory system:  Bilateral decreased breath sounds at bases with some scattered crackles Cardiovascular system: S1 & S2 heard, Rate controlled Gastrointestinal system: Abdomen is nondistended, soft and nontender. Normal bowel sounds heard. Extremities: No cyanosis,  clubbing; trace lower extremity edema  Central nervous system: Awake, extremely slow to respond.  Poor historian.  No focal neurological deficits. Moving extremities Skin: No rashes, lesions or ulcers Psychiatry: Flat affect.  Not agitated.    Data Reviewed: I have personally reviewed following labs and imaging studies  CBC: Recent Labs  Lab 04/01/23 1355 04/02/23 1606 04/03/23 0220 04/04/23 0150 04/05/23 0133  WBC 16.6* 16.8* 16.5* 11.1* 10.4  NEUTROABS 14.5* 13.7*  --   --   --   HGB 13.4 13.1 11.9* 11.2* 11.9*  HCT 38.8* 37.5* 35.6* 32.6* 34.8*  MCV 96.8 95.7 97.8 96.4 96.4  PLT 307 311 302 296 341   Basic Metabolic Panel: Recent Labs  Lab 04/01/23 1355 04/02/23 1606 04/03/23 0220 04/04/23 0150 04/05/23 0133  NA 129* 130* 130* 131* 130*  K 4.3 3.9 3.8 3.8 4.3  CL 93* 93* 96* 99 100  CO2 GLUCOSE 185* 149* 136* 107* 132*  BUN 11 24* CREATININE 1.05 1.38* 1.07 0.79 0.66  CALCIUM 9.8 9.8 9.2 8.8* 9.0  MG  --   --   --  2.2  --   PHOS  --   --   --  3.6  --    GFR: Estimated Creatinine Clearance: 68.4 mL/min (by C-G formula based on SCr of 0.66 mg/dL). Liver Function Tests: Recent Labs  Lab 03/31/23 1745 04/01/23 1355 04/03/23 0220 04/04/23 0150  AST 19 22 55* 41  ALT 5 6 7 6   ALKPHOS 55 52 44 43  BILITOT 1.4* 1.8* 1.4* 1.0  PROT 7.8 6.9 6.0* 5.8*  ALBUMIN 4.3 3.7 2.8* 2.6*   No results for input(s): "LIPASE", "AMYLASE" in the last 168 hours. Recent Labs  Lab 04/02/23 0850  AMMONIA 33   Coagulation Profile: No results for input(s): "INR", "PROTIME" in the last 168 hours. Cardiac Enzymes: Recent Labs  Lab 04/02/23 0850  CKTOTAL 234   BNP (last 3 results) No results for input(s): "PROBNP" in the last 8760 hours. HbA1C: No results for input(s): "HGBA1C" in the last 72 hours. CBG: Recent Labs  Lab 03/31/23 1938  GLUCAP 148*   Lipid Profile: No results for input(s): "CHOL", "HDL", "LDLCALC", "TRIG", "CHOLHDL",  "LDLDIRECT" in the last 72 hours. Thyroid Function Tests: No results for input(s): "TSH", "T4TOTAL", "FREET4", "T3FREE", "THYROIDAB" in the last 72 hours. Anemia Panel: No results for input(s): "VITAMINB12", "FOLATE", "FERRITIN", "TIBC", "IRON", "RETICCTPCT" in the last 72 hours. Sepsis Labs: Recent Labs  Lab 04/02/23 2041  LATICACIDVEN 1.0    Recent Results (from the past 240 hour(s))  Urine Culture     Status: None   Collection Time: 03/31/23 12:05 AM   Specimen: Urine, Clean Catch  Result Value Ref Range Status   Specimen Description URINE, CLEAN CATCH  Final   Special Requests NONE  Final   Culture   Final    NO GROWTH Performed at [facility], [address], [place], [place] [address]    Report Status 04/02/2023 FINAL  Final  Resp panel by RT-PCR (RSV, Flu A&B, Covid) Anterior Nasal Swab     Status: None   Collection Time: 03/31/23  5:49 PM   Specimen: Anterior Nasal Swab  Result Value Ref Range Status   SARS Coronavirus  2 by RT PCR NEGATIVE NEGATIVE Final   Influenza A by PCR NEGATIVE NEGATIVE Final   Influenza B by PCR NEGATIVE NEGATIVE Final    Comment: (NOTE) The Xpert Xpress SARS-CoV-2/FLU/RSV plus assay is intended as an aid in the diagnosis of influenza from Nasopharyngeal swab specimens and should not be used as a sole basis for treatment. Nasal washings and aspirates are unacceptable for Xpert Xpress SARS-CoV-2/FLU/RSV testing.  Fact Sheet for Patients: BloggerCourse.com  Fact Sheet for Healthcare Providers: SeriousBroker.it  This test is not yet approved or cleared by the [place] FDA and has been authorized for detection and/or diagnosis of SARS-CoV-2 by FDA under an Emergency Use Authorization (EUA). This EUA will remain in effect (meaning this test can be used) for the duration of the COVID-19 declaration under Section 564(b)(1) of the Act, 21 U.S.C. section 360bbb-3(b)(1), unless the  authorization is terminated or revoked.     Resp Syncytial Virus by PCR NEGATIVE NEGATIVE Final    Comment: (NOTE) Fact Sheet for Patients: BloggerCourse.com  Fact Sheet for Healthcare Providers: SeriousBroker.it  This test is not yet approved or cleared by the [place] FDA and has been authorized for detection and/or diagnosis of SARS-CoV-2 by FDA under an Emergency Use Authorization (EUA). This EUA will remain in effect (meaning this test can be used) for the duration of the COVID-19 declaration under Section 564(b)(1) of the Act, 21 U.S.C. section 360bbb-3(b)(1), unless the authorization is terminated or revoked.  Performed at [facility], [address] [place], [place] [address]   Blood culture (routine x 2)     Status: None (Preliminary result)   Collection Time: 04/02/23  8:41 PM   Specimen: BLOOD RIGHT HAND  Result Value Ref Range Status   Specimen Description BLOOD RIGHT HAND  Final   Special Requests   Final    BOTTLES DRAWN AEROBIC AND ANAEROBIC Blood Culture results may not be optimal due to an inadequate volume of blood received in culture bottles   Culture   Final    NO GROWTH 3 DAYS Performed at [facility], [address], [place], [place] [address]    Report Status PENDING  Incomplete  Aerobic/Anaerobic Culture w Gram Stain (surgical/deep wound)     Status: None (Preliminary result)   Collection Time: 04/03/23 12:21 AM   Specimen: Buttocks; Abscess  Result Value Ref Range Status   Specimen Description BUTTOCKS ABSCESS  Final   Special Requests LEFT GLUTEAL  Final   Gram Stain   Final    MODERATE WBC PRESENT,BOTH PMN AND MONONUCLEAR RARE GRAM POSITIVE COCCI IN PAIRS RARE GRAM NEGATIVE RODS RARE GRAM POSITIVE RODS    Culture   Final    FEW ESCHERICHIA COLI SUSCEPTIBILITIES TO FOLLOW Performed at [facility], [address], [place], [place] [address]    Report Status PENDING   Incomplete         Radiology Studies: No results found.      Scheduled Meds:  aspirin EC  81 mg Oral q AM   atorvastatin  40 mg Oral QHS   bethanechol  10 mg Oral QID   Chlorhexidine Gluconate Cloth  6 each Topical Daily   cyanocobalamin  1,000 mcg Subcutaneous Daily   enoxaparin (LOVENOX) injection  40 mg Subcutaneous Q24H   feeding supplement  237 mL Oral BID BM   finasteride  5 mg Oral QHS   lidocaine  1 patch Transdermal Q24H   tamsulosin  0.4 mg Oral  QHS   Continuous Infusions:  piperacillin-tazobactam (ZOSYN)  IV 3.375 g (04/05/23 4098)          Glade Lloyd, MD [facility] 04/05/2023, 9:32 AM

## 2023-04-06 DIAGNOSIS — K610 Anal abscess: Secondary | ICD-10-CM | POA: Diagnosis not present

## 2023-04-06 LAB — CBC WITH DIFFERENTIAL/PLATELET
Abs Immature Granulocytes: 0.06 10*3/uL (ref 0.00–0.07)
Basophils Absolute: 0.1 10*3/uL (ref 0.0–0.1)
Basophils Relative: 1 %
Eosinophils Absolute: 0.4 10*3/uL (ref 0.0–0.5)
Eosinophils Relative: 4 %
HCT: 35.8 % — ABNORMAL LOW (ref 39.0–52.0)
Hemoglobin: 11.8 g/dL — ABNORMAL LOW (ref 13.0–17.0)
Immature Granulocytes: 1 %
Lymphocytes Relative: 11 %
Lymphs Abs: 1 10*3/uL (ref 0.7–4.0)
MCH: 32.2 pg (ref 26.0–34.0)
MCHC: 33 g/dL (ref 30.0–36.0)
MCV: 97.5 fL (ref 80.0–100.0)
Monocytes Absolute: 1 10*3/uL (ref 0.1–1.0)
Monocytes Relative: 12 %
Neutro Abs: 6.3 10*3/uL (ref 1.7–7.7)
Neutrophils Relative %: 71 %
Platelets: 395 10*3/uL (ref 150–400)
RBC: 3.67 MIL/uL — ABNORMAL LOW (ref 4.22–5.81)
RDW: 12.9 % (ref 11.5–15.5)
WBC: 8.8 10*3/uL (ref 4.0–10.5)
nRBC: 0 % (ref 0.0–0.2)

## 2023-04-06 LAB — BASIC METABOLIC PANEL
Anion gap: 7 (ref 5–15)
BUN: 8 mg/dL (ref 8–23)
CO2: 25 mmol/L (ref 22–32)
Calcium: 9.1 mg/dL (ref 8.9–10.3)
Chloride: 99 mmol/L (ref 98–111)
Creatinine, Ser: 0.55 mg/dL — ABNORMAL LOW (ref 0.61–1.24)
GFR, Estimated: >60 mL/min (ref >60)
Glucose, Bld: 130 mg/dL — ABNORMAL HIGH (ref 70–99)
Potassium: 3.9 mmol/L (ref 3.5–5.1)
Sodium: 131 mmol/L — ABNORMAL LOW (ref 135–145)

## 2023-04-06 LAB — CULTURE, BLOOD (ROUTINE X 2)

## 2023-04-06 LAB — MAGNESIUM: Magnesium: 2.2 mg/dL (ref 1.7–2.4)

## 2023-04-06 MED ORDER — AMOXICILLIN-POT CLAVULANATE 875-125 MG PO TABS: 1.0000 | ORAL_TABLET | Freq: Two times a day (BID) | ORAL | 0 refills | Status: AC

## 2023-04-06 MED ORDER — VITAMIN B-12 1000 MCG PO TABS: 1000.0000 ug | ORAL_TABLET | Freq: Every day | ORAL | 0 refills | Status: AC

## 2023-04-06 MED ORDER — HALOPERIDOL LACTATE 5 MG/ML IJ SOLN
2.0000 mg | Freq: Once | INTRAMUSCULAR | Status: AC
  Administered 2023-04-06: 2 mg via INTRAVENOUS
  Filled 2023-04-06: qty 1

## 2023-04-06 MED ORDER — LIDOCAINE 5 % EX PTCH: 1.0000 | MEDICATED_PATCH | CUTANEOUS | 0 refills | Status: AC

## 2023-04-06 NOTE — TOC Transition Note (Signed)
Transition of Care [facility]) - CM/SW Discharge Note   Patient Details  Name: Jay Tran MRN: [id number] Date of Birth: [birth year]  [facility] CM/SW Contact:  Lorri Frederick, LCSW Phone Number: 04/06/2023, 11:21 AM   Clinical Narrative:   PT discharging to [facility], room 102.  RN call report to [phone].   1000: CSW confirmed with [facility] that they can accept pt today.   Final next level of care: Skilled Nursing Facility Barriers to Discharge: Barriers Resolved   Patient Goals and CMS Choice   Choice offered to / list presented to : Spouse  Discharge Placement                Patient chooses bed at: [facility] [place] Patient to be transferred to facility by: [facility] Name of family member notified: wife Chyrl Civatte in room Patient and family notified of of transfer: 04/06/23  Discharge Plan and Services Additional resources added to the After Visit Summary for   In-house Referral: Clinical Social Work   Post Acute Care Choice: Skilled Nursing Facility                               Social Determinants of Health (SDOH) Interventions SDOH Screenings   Food Insecurity: No Food Insecurity (04/02/2023)  Housing: Low Risk  (04/02/2023)  Transportation Needs: No Transportation Needs (04/02/2023)  Utilities: Not At Risk (04/02/2023)  Tobacco Use: Medium Risk (04/03/2023)     Readmission Risk Interventions     No data to display

## 2023-04-06 NOTE — Discharge Summary (Signed)
Physician Discharge Summary  Jay Tran:[id number] DOB: 08/02/1934 DOA: 03/31/2023  PCP: Kaleen Mask, MD  Admit date: 03/31/2023 Discharge date: 04/06/2023  Admitted From: Home Disposition: SNF  Recommendations for Outpatient Follow-up:  Follow up with SNF provider at earliest convenience Outpatient follow-up with general surgery.  Wound care as per general surgery recommendations Recommend outpatient evaluation and follow-up by palliative care if condition would not improve Follow up in ED if symptoms worsen or new appear   Home Health: No Equipment/Devices: None  Discharge Condition: Stable CODE STATUS: DNR.  Discussed with wife at bedside today on 04/06/2023 who agrees for DNR status. Diet recommendation: Heart healthy  Brief/Interim Summary: 89 year old with a history of BPH with urinary retention, chronic back pain status post lumbar fusion with spinal cord stimulator, HTN, carotid artery disease, DVT, HLD, and unspecified CVA, recent hospitalization for UTI requiring discharge to SNF and subsequent discharge home presented with worsening generalized weakness and uncontrolled blood pressure while being treated on oral Bactrim for UTI as an outpatient.  On presentation, CT of the head was unrevealing; WBC of 18; UA was unremarkable; chest x-ray without any infiltrate and COVID 19 and influenza testing were negative.  While boarding in the ER, patient developed intermittent confusion. developed intermittent confusion. MRI of the brain revealed no acute abnormality. Abdominal ultrasound revealed no acute cholecystitis. MRI lumbar spine was obtained given weakness to rule out spinal complication/infection, and was negative.  In the afternoon of 04/02/2023, patient started to have abdominal pain.  CT of abdomen and pelvis showed perirectal gluteal abscess.  He was started on broad-spectrum antibiotics.  General surgery was consulted: Attempts to address the abscess conservatively  failed.  He underwent I&D by general surgery on 04/02/2023.  Subsequently, his condition has improved except for intermittent delirium.  Wound culture grew E. coli.  General surgery recommended total of 7 days of antibiotics and has cleared the patient for discharge.  PT recommended SNF placement.  He will be discharged to SNF once bed is available on 3 more days of oral Augmentin.  Outpatient follow-up with general surgery.  Discharge Diagnoses:   Left gluteal abscess -Status post I&D by general surgery on 04/02/2023.  Currently on Zosyn.  OR cultures grew E. coli.  Wound care as per general surgery. -General surgery recommended total of 7 days of antibiotics and has cleared the patient for discharge.  He will be discharged to SNF once bed is available on 3 more days of oral Augmentin.  Outpatient follow-up with general surgery.   Recurrent urinary retention BPH -Foley catheter placed in the ED which has subsequently been removed.  Continue Flomax and finasteride.  Outpatient follow-up with urology if needed   Acute delirium Acute toxic and metabolic encephalopathy -Possibly from above.  TSH/free T4 normal.  Folic acid and ammonia levels normal. -B12 is low: Being replaced.  RPR negative.  CT and MRI of brain were unrevealing -Still slow to respond but mental status steadily improving.  Monitor mental status.  Fall precautions.   B12 deficiency -Treated with parenteral B12 while in the hospital; switch to oral on discharge.  Outpatient follow-up   Leukocytosis -Resolved   Normocytic anemia -From chronic illnesses.  Hemoglobin currently stable.  Outpatient follow-up.   Hyponatremia--mild.  Continue to encourage oral intake.  Outpatient follow-up.   Hyperlipidemia -Continue statin  Hypertension -Blood pressure on the lower side.  Lisinopril on hold.   Generalized weakness -PT recommending SNF placement.    Discharge Instructions  Discharge Instructions  Diet - low sodium  heart healthy   Complete by: As directed    Discharge wound care:   Complete by: As directed    As per general surgery   Increase activity slowly   Complete by: As directed       Allergies as of 04/06/2023       Reactions   Codeine Itching, Nausea And Vomiting   Other Nausea Only   Anesthesia -  given with medication for nausea    Tramadol Itching, Nausea Only        Medication List     STOP taking these medications    lisinopril 20 MG tablet Commonly known as: ZESTRIL   SALONPAS PAIN RELIEF PATCH EX       TAKE these medications    acetaminophen 500 MG tablet Commonly known as: TYLENOL Take 1-2 tablets (500-1,000 mg total) by mouth every 6 (six) hours as needed for mild pain, headache or moderate pain. What changed: when to take this   amoxicillin-clavulanate 875-125 MG tablet Commonly known as: AUGMENTIN Take 1 tablet by mouth 2 (two) times daily for 3 days.   aspirin EC 81 MG tablet Take 81 mg by mouth in the morning. Swallow whole.   atorvastatin 40 MG tablet Commonly known as: LIPITOR TAKE 1 TABLET BY MOUTH ONCE DAILY AT  6  PM What changed: See the new instructions.   finasteride 5 MG tablet Commonly known as: PROSCAR Take 5 mg by mouth at bedtime.   Fish Oil 1200 MG Caps Take 1,200 mg by mouth daily with breakfast.   lidocaine 5 % Commonly known as: LIDODERM Place 1 patch onto the skin daily. Remove & Discard patch within 12 hours or as directed by MD   omeprazole 20 MG tablet Commonly known as: PRILOSEC OTC Take 40 mg by mouth every evening.   ondansetron 8 MG tablet Commonly known as: ZOFRAN Take 8 mg by mouth every 8 (eight) hours as needed for nausea or vomiting.   polyethylene glycol 17 g packet Commonly known as: MIRALAX / GLYCOLAX Take 17 g by mouth daily as needed for mild constipation.   PRESERVISION AREDS 2 PO Take 1 capsule by mouth 2 (two) times daily.   QC TUMERIC COMPLEX PO Take 500 mg by mouth daily with breakfast.    Systane 0.4-0.3 % Soln Generic drug: Polyethyl Glycol-Propyl Glycol Place 1 drop into both eyes 3 (three) times daily as needed (for dryness).   tamsulosin 0.4 MG Caps capsule Commonly known as: FLOMAX Take 0.4 mg by mouth at bedtime.   tiZANidine 4 MG tablet Commonly known as: Zanaflex Take 1 tablet (4 mg total) by mouth every 8 (eight) hours as needed for muscle spasms. What changed:  how much to take when to take this               Discharge Care Instructions  (From admission, onward)           Start     Ordered   04/06/23 0000  Discharge wound care:       Comments: As per general surgery   04/06/23 1040            Contact information for follow-up providers     Kaleen Mask, MD .   Specialty: [facility] Medicine Contact information: [address] [place] [address] [phone]         [facility] Emergency Department at [facility] .   Specialty: Emergency Medicine Contact information:  [address] [id number] mc [place] [address] [phone]        Maczis, Hedda Slade, [place]. Go on 04/10/2023.   Specialty: General Surgery Why: 04/10/23 at 1:15 pm., Please arrive 30 minutes early to complete check in, and bring photo ID and insurance card. Contact information: [address] [facility] [place] [address] [phone]              Contact information for after-discharge care     Destination     [facility], [place] [facility] .   Service: Skilled Nursing Contact information: [address] [place] [address] [phone]                    Allergies  Allergen Reactions   Codeine Itching and Nausea And Vomiting   Other Nausea Only    Anesthesia -  given with medication for nausea    Tramadol Itching and Nausea Only    Consultations: General surgery   Procedures/Studies: CT ABDOMEN PELVIS  W CONTRAST  Result Date: 04/02/2023 CLINICAL DATA:  Left lower quadrant pain. EXAM: CT ABDOMEN AND PELVIS WITH CONTRAST TECHNIQUE: Multidetector CT imaging of the abdomen and pelvis was performed using the standard protocol following bolus administration of intravenous contrast. RADIATION DOSE REDUCTION: This exam was performed according to the departmental dose-optimization program which includes automated exposure control, adjustment of the mA and/or kV according to patient size and/or use of iterative reconstruction technique. CONTRAST:  75mL OMNIPAQUE IOHEXOL 350 MG/ML SOLN COMPARISON:  02/22/2023 FINDINGS: Lower Chest: No acute findings. Hepatobiliary: No hepatic masses identified. A few tiny calcified gallstones are again seen, however there is no evidence of cholecystitis or biliary ductal dilatation. Pancreas:  No mass or inflammatory changes. Spleen: Within normal limits in size and appearance. Adrenals/Urinary Tract: No suspicious masses identified. 2 mm calculus seen in midpole of right kidney. No evidence of ureteral calculi or hydronephrosis. Foley catheter is seen in the urinary bladder. Stomach/Bowel: Diverticulosis is seen mainly involving the descending and sigmoid colon, however there is no evidence of diverticulitis. 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 Rim enhancing fluid collection is seen adjacent to the gluteal crease which measures 2.0 x 1.1 cm, consistent with abscess. Vascular/Lymphatic: No pathologically enlarged lymph nodes. No acute vascular findings. Aortic atherosclerotic calcification incidentally noted. Reproductive:  No mass or other significant abnormality. Other:  None. Musculoskeletal: No suspicious bone lesions identified. Lumbar spine fusion hardware again noted. Neurostimulator device again seen with leads in the thoracic spinal canal. IMPRESSION: New complex left perianal fistula, with 2 cm perianal abscess adjacent to the  gluteal crease. Colonic diverticulosis, without radiographic evidence of diverticulitis. Cholelithiasis. No radiographic evidence of cholecystitis. Tiny right renal calculus. No evidence of ureteral calculi or hydronephrosis. Aortic Atherosclerosis (ICD10-I70.0). Electronically Signed   By: Danae Orleans M.D.   On: 04/02/2023 18:25   MR Lumbar Spine W Wo Contrast  Result Date: 04/02/2023 CLINICAL DATA:  Weakness. EXAM: MRI LUMBAR SPINE WITHOUT AND WITH CONTRAST TECHNIQUE: Multiplanar and multiecho pulse sequences of the lumbar spine were obtained without and with intravenous contrast. CONTRAST:  8.56mL GADAVIST GADOBUTROL 1 MMOL/ML IV SOLN COMPARISON:  CT abdomen pelvis dated February 22, 2023. MRI lumbar spine dated August 20, 2018. FINDINGS: Segmentation:  Standard. Alignment:  No significant listhesis. Vertebrae: No fracture, evidence of discitis, or bone lesion. Prior L1-L5 PLIF status post L4 and L5 pedicle screw removal. Chronic  degenerative fatty marrow endplate changes at T12-L1. Conus medullaris and cauda equina: Conus extends to the L1 level. Conus and cauda equina appear normal. Abnormal intrathecal enhancement. Paraspinal and other soft tissues: Presacral edema. No paravertebral or paraspinous inflammatory change. Disc levels: T12-L1: Chronic severe disc height loss with bulky circumferential disc osteophyte complex, similar to most recent CT, progressed since prior MRI from 2021. Mild bilateral facet arthropathy. Mild-to-moderate spinal canal stenosis. Moderate right and mild left lateral recess stenosis. Severe right and mild-to-moderate left neuroforaminal stenosis. L1-L2: Prior posterior decompression and PLIF. No spinal canal stenosis. Unchanged residual mild bilateral neuroforaminal stenosis due to bony hypertrophy. L2-L3: Prior posterior decompression and PLIF. Unchanged mild left paracentral endplate spurring. No residual stenosis. L3-L4: Prior posterior decompression and PLIF. No residual  stenosis. L4-L5: Prior posterior decompression and interbody fusion. Unchanged residual mild left neuroforaminal stenosis due to bony hypertrophy. No spinal canal or right neuroforaminal stenosis. L5-S1: Mild disc bulging and moderate to severe bilateral facet arthropathy. Unchanged moderate left and mild right neuroforaminal stenosis. No spinal canal stenosis. IMPRESSION: 1. Prior L1-L5 PLIF status post L4 and L5 pedicle screw removal. No acute abnormality. 2. Similar adjacent segment disease at T12-L1 with mild-to-moderate spinal canal stenosis and severe right neuroforaminal stenosis. 3. Similar adjacent segment disease at L5-S1 with advanced facet arthropathy and moderate left neuroforaminal stenosis at L5-S1. Electronically Signed   By: Obie Dredge M.D.   On: 04/02/2023 14:28   MR BRAIN WO CONTRAST  Result Date: 04/01/2023 CLINICAL DATA:  Initial evaluation for neuro deficit, stroke. EXAM: MRI HEAD WITHOUT CONTRAST TECHNIQUE: Multiplanar, multiecho pulse sequences of the brain and surrounding structures were obtained without intravenous contrast. COMPARISON:  CT from 03/31/2023. FINDINGS: Brain: Generalized age-related cerebral atrophy. Patchy and confluent T2/FLAIR hyperintensity involving the periventricular deep white matter both cerebral hemispheres, consistent with chronic small vessel ischemic disease, moderate in nature. No evidence for acute or subacute ischemia. Gray-white matter differentiation maintained. No areas of chronic cortical infarction. No acute or chronic intracranial blood products. No mass lesion, midline shift or mass effect. No hydronephrosis or extra-axial fluid collection. Pituitary gland and suprasellar region within normal limits. Vascular: Major intracranial vascular flow voids are maintained. Skull and upper cervical spine: Craniocervical junction normal. Bone marrow signal intensity within normal limits. No scalp soft tissue abnormality. Sinuses/Orbits: Prior bilateral  ocular lens replacement. Mild scattered mucosal thickening present about the ethmoidal air cells. Paranasal sinuses are otherwise clear. Small left mastoid effusion noted, of doubtful significance. Other: None. IMPRESSION: 1. No acute intracranial abnormality. 2. Age-related cerebral atrophy with moderate chronic microvascular ischemic disease. Electronically Signed   By: Rise Mu M.D.   On: 04/01/2023 23:27   US Abdomen Limited RUQ (LIVER/GB)  Result Date: 04/01/2023 CLINICAL DATA:  [id number] Elevated bilirubin [id number] EXAM: ULTRASOUND ABDOMEN LIMITED RIGHT UPPER QUADRANT COMPARISON:  CT 02/22/2023 FINDINGS: Gallbladder: There are a few echogenic, shadowing stones within the gallbladder measuring up to 0.8 cm. No pericholecystic fluid or [place] thickening visualized. No sonographic Murphy sign noted by sonographer. Common bile duct: Diameter: 7 mm. Liver: Evaluation of the left hepatic lobe was slightly limited due to overlying bowel gas. No focal lesion identified. Within normal limits in parenchymal echogenicity. Portal vein is patent on color Doppler imaging with normal direction of blood flow towards the liver. Other: None. IMPRESSION: Cholelithiasis without sonographic evidence of acute cholecystitis. Electronically Signed   By: Duanne Guess D.O.   On: 04/01/2023 16:36   CT Head Wo Contrast  Result Date: 03/31/2023 CLINICAL DATA:  Elevated blood pressure.  Generalized weakness. EXAM: CT HEAD WITHOUT CONTRAST TECHNIQUE: Contiguous axial images were obtained from the base of the skull through the vertex without intravenous contrast. RADIATION DOSE REDUCTION: This exam was performed according to the departmental dose-optimization program which includes automated exposure control, adjustment of the mA and/or kV according to patient size and/or use of iterative reconstruction technique. COMPARISON:  02/22/2023 FINDINGS: Brain: There is no evidence for acute hemorrhage, hydrocephalus, mass lesion,  or abnormal extra-axial fluid collection. No definite CT evidence for acute infarction. Diffuse loss of parenchymal volume is consistent with atrophy. Patchy low attenuation in the deep hemispheric and periventricular white matter is nonspecific, but likely reflects chronic microvascular ischemic demyelination. Vascular: No hyperdense vessel or unexpected calcification. Skull: No evidence for fracture. No worrisome lytic or sclerotic lesion. Sinuses/Orbits: The visualized paranasal sinuses and mastoid air cells are clear. Visualized portions of the globes and intraorbital fat are unremarkable. Other: None. IMPRESSION: 1. No acute intracranial abnormality. 2. Atrophy with chronic small vessel ischemic disease. Electronically Signed   By: Kennith Center M.D.   On: 03/31/2023 20:31   DG Chest Portable 1 View  Result Date: 03/31/2023 CLINICAL DATA:  weak EXAM: PORTABLE CHEST - 1 VIEW COMPARISON:  02/22/2023. FINDINGS: Cardiac silhouette enlarged. No evidence of pneumothorax or pleural effusion. No evidence of pulmonary edema. Aorta is calcified. Lower thoracic spine stimulation wires are noted. IMPRESSION: Enlarged cardiac silhouette. Electronically Signed   By: Layla Maw M.D.   On: 03/31/2023 18:14   OCT, Retina - OU - Both Eyes  Result Date: 03/27/2023 Right Eye Quality was good. Central Foveal Thickness: 536. Progression has been stable. Findings include no SRF, abnormal foveal contour, retinal drusen , intraretinal fluid, pigment epithelial detachment, outer retinal atrophy (Large central PED with cystic changes overlying, +VMA -- all stable from prior)). Left Eye Quality was good. Central Foveal Thickness: 246. Progression has been stable. Findings include no IRF, no SRF, abnormal foveal contour, retinal drusen , subretinal hyper-reflective material, pigment epithelial detachment, outer retinal atrophy (Central ORA and low SRHM, trace cystic changes -- stable from prior). Notes *Images captured and  stored on drive Diagnosis / Impression: OD: Large central PED with cystic changes overlying, +VMA -- all stable from prior) OS: Central ORA and low SRHM, trace cystic changes -- stable from prior Clinical management: See below Abbreviations: NFP - Normal foveal profile. CME - cystoid macular edema. PED - pigment epithelial detachment. IRF - intraretinal fluid. SRF - subretinal fluid. EZ - ellipsoid zone. ERM - epiretinal membrane. ORA - outer retinal atrophy. ORT - outer retinal tubulation. SRHM - subretinal hyper-reflective material. IRHM - intraretinal hyper-reflective material   VAS US CAROTID  Result Date: 03/20/2023 Carotid Arterial Duplex Study Patient Name:  NIKLAS CHRETIEN Milks  Date of Exam:   03/20/2023 Medical Rec #: [id number]      Accession #:    [id number] Date of Birth: 1934/07/23      Patient Gender: M Patient Age:   89 years Exam Location:  [facility] Procedure:      VAS US CAROTID Referring Phys: Coral Else --------------------------------------------------------------------------------  Indications:  CVA and right endarterectomy. Risk Factors: Hypertension, hyperlipidemia. Limitations   Today's exam was limited due to the body habitus of the patient               and the patient's inability or unwillingness to cooperate. Performing Technologist: Dorthula Matas RVS, RCS  Examination Guidelines: A complete evaluation includes B-mode imaging, spectral Doppler, color  Doppler, and power Doppler as needed of all accessible portions of each vessel. Bilateral testing is considered an integral part of a complete examination. Limited examinations for reoccurring indications may be performed as noted.  Right Carotid Findings: +----------+--------+--------+--------+------------------+--------+           PSV cm/sEDV cm/sStenosisPlaque DescriptionComments +----------+--------+--------+--------+------------------+--------+ CCA Prox  85      9                                           +----------+--------+--------+--------+------------------+--------+ CCA Mid   83      15                                         +----------+--------+--------+--------+------------------+--------+ CCA Distal55      10              diffuse                    +----------+--------+--------+--------+------------------+--------+ ICA Prox  65      15      1-39%                              +----------+--------+--------+--------+------------------+--------+ ICA Mid   71      20                                         +----------+--------+--------+--------+------------------+--------+ ICA Distal60      13                                         +----------+--------+--------+--------+------------------+--------+ ECA       88      10                                         +----------+--------+--------+--------+------------------+--------+ +----------+--------+-------+----------------+-------------------+           PSV cm/sEDV cmsDescribe        Arm Pressure (mmHG) +----------+--------+-------+----------------+-------------------+ [id number]            Multiphasic, [id number]                 +----------+--------+-------+----------------+-------------------+ +---------+--------+--+--------+--+---------+ VertebralPSV cm/s65EDV cm/s11Antegrade +---------+--------+--+--------+--+---------+  Left Carotid Findings: +----------+--------+--------+--------+------------------+--------+           PSV cm/sEDV cm/sStenosisPlaque DescriptionComments +----------+--------+--------+--------+------------------+--------+ CCA Prox  106     14                                         +----------+--------+--------+--------+------------------+--------+ CCA Mid   88      12                                         +----------+--------+--------+--------+------------------+--------+ CCA Distal73      12  heterogenous                +----------+--------+--------+--------+------------------+--------+ ICA Prox  99      18      1-39%   heterogenous               +----------+--------+--------+--------+------------------+--------+ ICA Mid   102     18                                         +----------+--------+--------+--------+------------------+--------+ ICA Distal77      16                                         +----------+--------+--------+--------+------------------+--------+ ECA       130     10                                         +----------+--------+--------+--------+------------------+--------+ +----------+--------+--------+----------------+-------------------+           PSV cm/sEDV cm/sDescribe        Arm Pressure (mmHG) +----------+--------+--------+----------------+-------------------+ Subclavian230             Multiphasic, [id number]                 +----------+--------+--------+----------------+-------------------+ +---------+--------+---+--------+--+---------+ VertebralPSV cm/s105EDV cm/s17Antegrade +---------+--------+---+--------+--+---------+   Summary: Right Carotid: Velocities in the right ICA are consistent with a 1-39% stenosis. Left Carotid: Velocities in the left ICA are consistent with a 1-39% stenosis. Vertebrals:  Bilateral vertebral arteries demonstrate antegrade flow. Subclavians: Normal flow hemodynamics were seen in bilateral subclavian              arteries. *See table(s) above for measurements and observations.  Electronically signed by Sherald Hess MD on 03/20/2023 at 3:40:55 PM.    Final       Subjective: Patient seen and examined at bedside.  Nursing staff reports agitation overnight.  No fever, vomiting, seizures reported.  Wife present at bedside.  Discharge Exam: Vitals:   04/06/23 0559 04/06/23 0801  BP: (!) 102/50 (!) 123/57  Pulse: 75 74  Resp: 20 18  Temp: 98.1 F (36.7 C) 98.3 F (36.8 C)  SpO2: 96% 93%    General: Pt is alert, awake,  not in acute distress.  Slow to respond.  Poor historian.  On room air.  Elderly male lying in bed. Cardiovascular: rate controlled, S1/S2 + Respiratory: bilateral decreased breath sounds at bases Abdominal: Soft, NT, ND, bowel sounds + Extremities: Trace lower extremity edema; no cyanosis    The results of significant diagnostics from this hospitalization (including imaging, microbiology, ancillary and laboratory) are listed below for reference.     Microbiology: Recent Results (from the past 240 hour(s))  Urine Culture     Status: None   Collection Time: 03/31/23 12:05 AM   Specimen: Urine, Clean Catch  Result Value Ref Range Status   Specimen Description URINE, CLEAN CATCH  Final   Special Requests NONE  Final   Culture   Final    NO GROWTH Performed at [facility], [address], [place], [place] [address]    Report Status 04/02/2023 FINAL  Final  Resp panel by RT-PCR (RSV, Flu A&B, Covid) Anterior Nasal Swab  Status: None   Collection Time: 03/31/23  5:49 PM   Specimen: Anterior Nasal Swab  Result Value Ref Range Status   SARS Coronavirus 2 by RT PCR NEGATIVE NEGATIVE Final   Influenza A by PCR NEGATIVE NEGATIVE Final   Influenza B by PCR NEGATIVE NEGATIVE Final    Comment: (NOTE) The Xpert Xpress SARS-CoV-2/FLU/RSV plus assay is intended as an aid in the diagnosis of influenza from Nasopharyngeal swab specimens and should not be used as a sole basis for treatment. Nasal washings and aspirates are unacceptable for Xpert Xpress SARS-CoV-2/FLU/RSV testing.  Fact Sheet for Patients: BloggerCourse.com  Fact Sheet for Healthcare Providers: SeriousBroker.it  This test is not yet approved or cleared by the [place] FDA and has been authorized for detection and/or diagnosis of SARS-CoV-2 by FDA under an Emergency Use Authorization (EUA). This EUA will remain in effect (meaning this test can be used) for the  duration of the COVID-19 declaration under Section 564(b)(1) of the Act, 21 U.S.C. section 360bbb-3(b)(1), unless the authorization is terminated or revoked.     Resp Syncytial Virus by PCR NEGATIVE NEGATIVE Final    Comment: (NOTE) Fact Sheet for Patients: BloggerCourse.com  Fact Sheet for Healthcare Providers: SeriousBroker.it  This test is not yet approved or cleared by the [place] FDA and has been authorized for detection and/or diagnosis of SARS-CoV-2 by FDA under an Emergency Use Authorization (EUA). This EUA will remain in effect (meaning this test can be used) for the duration of the COVID-19 declaration under Section 564(b)(1) of the Act, 21 U.S.C. section 360bbb-3(b)(1), unless the authorization is terminated or revoked.  Performed at [facility], [address], [place], [place] [address]   Blood culture (routine x 2)     Status: None (Preliminary result)   Collection Time: 04/02/23  8:41 PM   Specimen: BLOOD RIGHT HAND  Result Value Ref Range Status   Specimen Description BLOOD RIGHT HAND  Final   Special Requests   Final    BOTTLES DRAWN AEROBIC AND ANAEROBIC Blood Culture results may not be optimal due to an inadequate volume of blood received in culture bottles   Culture   Final    NO GROWTH 4 DAYS Performed at [facility], [address] [place], [place] [address]    Report Status PENDING  Incomplete  Aerobic/Anaerobic Culture w Gram Stain (surgical/deep wound)     Status: None   Collection Time: 04/03/23 12:21 AM   Specimen: Buttocks; Abscess  Result Value Ref Range Status   Specimen Description BUTTOCKS ABSCESS  Final   Special Requests LEFT GLUTEAL  Final   Gram Stain   Final    MODERATE WBC PRESENT,BOTH PMN AND MONONUCLEAR RARE GRAM POSITIVE COCCI IN PAIRS RARE GRAM NEGATIVE RODS RARE GRAM POSITIVE RODS Performed at [facility], [address] [place], [place] [address]     Culture   Final    FEW ESCHERICHIA COLI MIXED ANAEROBIC FLORA PRESENT.  CALL LAB IF FURTHER IID REQUIRED.    Report Status 04/05/2023 FINAL  Final   Organism ID, Bacteria ESCHERICHIA COLI  Final      Susceptibility   Escherichia coli - MIC*    AMPICILLIN 16 INTERMEDIATE Intermediate     CEFEPIME <=0.12 SENSITIVE Sensitive     CEFTAZIDIME <=1 SENSITIVE Sensitive     CEFTRIAXONE <=0.25 SENSITIVE Sensitive     CIPROFLOXACIN <=0.25 SENSITIVE Sensitive     GENTAMICIN <=1 SENSITIVE Sensitive     IMIPENEM <=  0.25 SENSITIVE Sensitive     TRIMETH/SULFA <=20 SENSITIVE Sensitive     AMPICILLIN/SULBACTAM <=2 SENSITIVE Sensitive     PIP/TAZO <=4 SENSITIVE Sensitive     * FEW ESCHERICHIA COLI     Labs: BNP (last 3 results) Recent Labs    02/22/23 0105  BNP 97.3   Basic Metabolic Panel: Recent Labs  Lab 04/02/23 1606 04/03/23 0220 04/04/23 0150 04/05/23 0133 04/06/23 0233  NA 130* 130* 131* 130* 131*  K 3.9 3.8 3.8 4.3 3.9  CL 93* 96* 99 100 99  CO2 25 23 26 26 25   GLUCOSE 149* 136* 107* 132* 130*  BUN 24* 20 15 11 8   CREATININE 1.38* 1.07 0.79 0.66 0.55*  CALCIUM 9.8 9.2 8.8* 9.0 9.1  MG  --   --  2.2  --  2.2  PHOS  --   --  3.6  --   --    Liver Function Tests: Recent Labs  Lab 03/31/23 1745 04/01/23 1355 04/03/23 0220 04/04/23 0150  AST 19 22 55* 41  ALT 5 6 7 6   ALKPHOS 55 52 44 43  BILITOT 1.4* 1.8* 1.4* 1.0  PROT 7.8 6.9 6.0* 5.8*  ALBUMIN 4.3 3.7 2.8* 2.6*   No results for input(s): "LIPASE", "AMYLASE" in the last 168 hours. Recent Labs  Lab 04/02/23 0850  AMMONIA 33   CBC: Recent Labs  Lab 04/01/23 1355 04/02/23 1606 04/03/23 0220 04/04/23 0150 04/05/23 0133 04/06/23 0233  WBC 16.6* 16.8* 16.5* 11.1* 10.4 8.8  NEUTROABS 14.5* 13.7*  --   --   --  6.3  HGB 13.4 13.1 11.9* 11.2* 11.9* 11.8*  HCT 38.8* 37.5* 35.6* 32.6* 34.8* 35.8*  MCV 96.8 95.7 97.8 96.4 96.4 97.5  PLT 307 311 302 296 341 395   Cardiac Enzymes: Recent Labs  Lab  04/02/23 0850  CKTOTAL 234   BNP: Invalid input(s): "POCBNP" CBG: Recent Labs  Lab 03/31/23 1938  GLUCAP 148*   D-Dimer No results for input(s): "DDIMER" in the last 72 hours. Hgb A1c No results for input(s): "HGBA1C" in the last 72 hours. Lipid Profile No results for input(s): "CHOL", "HDL", "LDLCALC", "TRIG", "CHOLHDL", "LDLDIRECT" in the last 72 hours. Thyroid function studies No results for input(s): "TSH", "T4TOTAL", "T3FREE", "THYROIDAB" in the last 72 hours.  Invalid input(s): "FREET3" Anemia work up No results for input(s): "VITAMINB12", "FOLATE", "FERRITIN", "TIBC", "IRON", "RETICCTPCT" in the last 72 hours. Urinalysis    Component Value Date/Time   COLORURINE YELLOW 03/31/2023 2042   APPEARANCEUR CLEAR 03/31/2023 2042   LABSPEC 1.011 03/31/2023 2042   PHURINE 6.0 03/31/2023 2042   GLUCOSEU NEGATIVE 03/31/2023 2042   HGBUR NEGATIVE 03/31/2023 2042   BILIRUBINUR NEGATIVE 03/31/2023 2042   KETONESUR 5 (A) 03/31/2023 2042   PROTEINUR NEGATIVE 03/31/2023 2042   UROBILINOGEN 0.2 05/27/2010 1530   NITRITE NEGATIVE 03/31/2023 2042   LEUKOCYTESUR NEGATIVE 03/31/2023 2042   Sepsis Labs Recent Labs  Lab 04/03/23 0220 04/04/23 0150 04/05/23 0133 04/06/23 0233  WBC 16.5* 11.1* 10.4 8.8   Microbiology Recent Results (from the past 240 hour(s))  Urine Culture     Status: None   Collection Time: 03/31/23 12:05 AM   Specimen: Urine, Clean Catch  Result Value Ref Range Status   Specimen Description URINE, CLEAN CATCH  Final   Special Requests NONE  Final   Culture   Final    NO GROWTH Performed at [facility], [address], [place], [place] [address]    Report Status  04/02/2023 FINAL  Final  Resp panel by RT-PCR (RSV, Flu A&B, Covid) Anterior Nasal Swab     Status: None   Collection Time: 03/31/23  5:49 PM   Specimen: Anterior Nasal Swab  Result Value Ref Range Status   SARS Coronavirus 2 by RT PCR NEGATIVE NEGATIVE Final   Influenza A by PCR  NEGATIVE NEGATIVE Final   Influenza B by PCR NEGATIVE NEGATIVE Final    Comment: (NOTE) The Xpert Xpress SARS-CoV-2/FLU/RSV plus assay is intended as an aid in the diagnosis of influenza from Nasopharyngeal swab specimens and should not be used as a sole basis for treatment. Nasal washings and aspirates are unacceptable for Xpert Xpress SARS-CoV-2/FLU/RSV testing.  Fact Sheet for Patients: BloggerCourse.com  Fact Sheet for Healthcare Providers: SeriousBroker.it  This test is not yet approved or cleared by the [place] FDA and has been authorized for detection and/or diagnosis of SARS-CoV-2 by FDA under an Emergency Use Authorization (EUA). This EUA will remain in effect (meaning this test can be used) for the duration of the COVID-19 declaration under Section 564(b)(1) of the Act, 21 U.S.C. section 360bbb-3(b)(1), unless the authorization is terminated or revoked.     Resp Syncytial Virus by PCR NEGATIVE NEGATIVE Final    Comment: (NOTE) Fact Sheet for Patients: BloggerCourse.com  Fact Sheet for Healthcare Providers: SeriousBroker.it  This test is not yet approved or cleared by the [place] FDA and has been authorized for detection and/or diagnosis of SARS-CoV-2 by FDA under an Emergency Use Authorization (EUA). This EUA will remain in effect (meaning this test can be used) for the duration of the COVID-19 declaration under Section 564(b)(1) of the Act, 21 U.S.C. section 360bbb-3(b)(1), unless the authorization is terminated or revoked.  Performed at [facility], [address], [place], [place] [address]   Blood culture (routine x 2)     Status: None (Preliminary result)   Collection Time: 04/02/23  8:41 PM   Specimen: BLOOD RIGHT HAND  Result Value Ref Range Status   Specimen Description BLOOD RIGHT HAND  Final   Special Requests   Final    BOTTLES  DRAWN AEROBIC AND ANAEROBIC Blood Culture results may not be optimal due to an inadequate volume of blood received in culture bottles   Culture   Final    NO GROWTH 4 DAYS Performed at [facility], [address] [place], [place] [address]    Report Status PENDING  Incomplete  Aerobic/Anaerobic Culture w Gram Stain (surgical/deep wound)     Status: None   Collection Time: 04/03/23 12:21 AM   Specimen: Buttocks; Abscess  Result Value Ref Range Status   Specimen Description BUTTOCKS ABSCESS  Final   Special Requests LEFT GLUTEAL  Final   Gram Stain   Final    MODERATE WBC PRESENT,BOTH PMN AND MONONUCLEAR RARE GRAM POSITIVE COCCI IN PAIRS RARE GRAM NEGATIVE RODS RARE GRAM POSITIVE RODS Performed at [facility], [address] [place], [place] [address]    Culture   Final    FEW ESCHERICHIA COLI MIXED ANAEROBIC FLORA PRESENT.  CALL LAB IF FURTHER IID REQUIRED.    Report Status 04/05/2023 FINAL  Final   Organism ID, Bacteria ESCHERICHIA COLI  Final      Susceptibility   Escherichia coli - MIC*    AMPICILLIN 16 INTERMEDIATE Intermediate     CEFEPIME <=0.12 SENSITIVE Sensitive     CEFTAZIDIME <=1 SENSITIVE Sensitive     CEFTRIAXONE <=0.25 SENSITIVE Sensitive  CIPROFLOXACIN <=0.25 SENSITIVE Sensitive     GENTAMICIN <=1 SENSITIVE Sensitive     IMIPENEM <=0.25 SENSITIVE Sensitive     TRIMETH/SULFA <=20 SENSITIVE Sensitive     AMPICILLIN/SULBACTAM <=2 SENSITIVE Sensitive     PIP/TAZO <=4 SENSITIVE Sensitive     * FEW ESCHERICHIA COLI     Time coordinating discharge: 35 minutes  SIGNED:   Glade Lloyd, MD  [facility] 04/06/2023, 10:40 AM

## 2023-04-06 NOTE — Plan of Care (Signed)

## 2023-04-06 NOTE — Progress Notes (Signed)
Awaiting SNF placement.  Patient remains surgically stable with abx recommendations made yesterday and follow up arranged.  We will be available as needed.  Letha Cape 8:47 AM 04/06/2023

## 2023-04-06 NOTE — Plan of Care (Signed)
  Problem: Education: Goal: Knowledge of General Education information will improve Description: Including pain rating scale, medication(s)/side effects and non-pharmacologic comfort measures Outcome: Progressing   Problem: Health Behavior/Discharge Planning: Goal: Ability to manage health-related needs will improve Outcome: Progressing   Problem: Activity: Goal: Risk for activity intolerance will decrease Outcome: Progressing   Problem: Nutrition: Goal: Adequate nutrition will be maintained Outcome: Progressing   Problem: Coping: Goal: Level of anxiety will decrease Outcome: Progressing   Problem: Skin Integrity: Goal: Risk for impaired skin integrity will decrease Outcome: Progressing   

## 2023-04-06 NOTE — Progress Notes (Signed)
    Patient Name: Jay Tran           DOB: 04/01/1934  MRN: [id number]      Admission Date: 03/31/2023  Attending Provider: Glade Lloyd, MD  Primary Diagnosis: Perianal abscess   Level of care: Med-Surg    CROSS COVER NOTE   Date of Service   04/06/2023   [name], 89 y.o. male, was admitted on 03/31/2023 for Perianal abscess.    HPI/Events of Note   Notified by nursing staff that patient is agitated, restless, and confused.     RN reports that patient has attempted to get out of bed multiple times.  High fall risk.  Unfortunately, he is not following commands despite multiple attempts at redirection by staff.   Patient has already received melatonin for sleep which was ineffective.  Will trial 2 mg IV haldol for agitation.    Interventions/ Plan   IV haldol         Anthoney Harada, DNP, ACNPC- AG [facility]

## 2023-04-07 LAB — CULTURE, BLOOD (ROUTINE X 2)

## 2023-04-13 ENCOUNTER — Encounter (INDEPENDENT_AMBULATORY_CARE_PROVIDER_SITE_OTHER): Payer: Medicare Other | Admitting: Ophthalmology

## 2023-04-14 ENCOUNTER — Emergency Department (HOSPITAL_COMMUNITY)
Admission: EM | Admit: 2023-04-14 | Discharge: 2023-04-14 | Disposition: A | Discharge status: Home / Self Care | Payer: Medicare Other | Attending: Student | Admitting: Student

## 2023-04-14 ENCOUNTER — Encounter (HOSPITAL_COMMUNITY): Payer: Self-pay

## 2023-04-14 DIAGNOSIS — Z7982 Long term (current) use of aspirin: Secondary | ICD-10-CM | POA: Insufficient documentation

## 2023-04-14 DIAGNOSIS — I10 Essential (primary) hypertension: Secondary | ICD-10-CM | POA: Insufficient documentation

## 2023-04-14 DIAGNOSIS — G8918 Other acute postprocedural pain: Secondary | ICD-10-CM | POA: Insufficient documentation

## 2023-04-14 DIAGNOSIS — Z79899 Other long term (current) drug therapy: Secondary | ICD-10-CM | POA: Insufficient documentation

## 2023-04-14 LAB — BASIC METABOLIC PANEL
Anion gap: 13 (ref 5–15)
BUN: 9 mg/dL (ref 8–23)
CO2: 23 mmol/L (ref 22–32)
Calcium: 9.6 mg/dL (ref 8.9–10.3)
Chloride: 101 mmol/L (ref 98–111)
Creatinine, Ser: 0.68 mg/dL (ref 0.61–1.24)
GFR, Estimated: >60 mL/min (ref >60)
Glucose, Bld: 117 mg/dL — ABNORMAL HIGH (ref 70–99)
Potassium: 4 mmol/L (ref 3.5–5.1)
Sodium: 137 mmol/L (ref 135–145)

## 2023-04-14 LAB — CBC
HCT: 36.8 % — ABNORMAL LOW (ref 39.0–52.0)
Hemoglobin: 12.9 g/dL — ABNORMAL LOW (ref 13.0–17.0)
MCH: 33.3 pg (ref 26.0–34.0)
MCHC: 35.1 g/dL (ref 30.0–36.0)
MCV: 95.1 fL (ref 80.0–100.0)
Platelets: 422 10*3/uL — ABNORMAL HIGH (ref 150–400)
RBC: 3.87 MIL/uL — ABNORMAL LOW (ref 4.22–5.81)
RDW: 13.2 % (ref 11.5–15.5)
WBC: 11.8 10*3/uL — ABNORMAL HIGH (ref 4.0–10.5)
nRBC: 0 % (ref 0.0–0.2)

## 2023-04-14 LAB — URINALYSIS, ROUTINE W REFLEX MICROSCOPIC
Bilirubin Urine: NEGATIVE
Glucose, UA: NEGATIVE mg/dL
Hgb urine dipstick: NEGATIVE
Ketones, ur: NEGATIVE mg/dL
Nitrite: NEGATIVE
Protein, ur: NEGATIVE mg/dL
Specific Gravity, Urine: 1.009 (ref 1.005–1.030)
pH: 6 (ref 5.0–8.0)

## 2023-04-14 NOTE — ED Notes (Signed)
ETA 40 minute

## 2023-04-14 NOTE — Discharge Instructions (Signed)
Your labs overall looked good today.  There were some infection fighting cells in the urine, but you do not seem to have any symptoms from this.  If you develop bladder pain, difficulty urinating or pain with urination, please see your primary care doctor.  Please keep PCP and surgery follow-up appointments as planned.  Return if you develop fever, drainage from your wounds, have any new symptoms or other concerns.

## 2023-04-14 NOTE — ED Provider Notes (Signed)
6:36 AM Signout from [facility] at shift change.   Patient who is 1 to 2 weeks status hospitalization for encephalopathy, buttocks abscess that had I&D performed --presents for pain.  Currently using topical lidocaine.  Currently awaiting labs and UA.  I&D wound appears to be well-healing.  Plan: Follow-up on labs, if reassuring, dispo back to facility.  BP (!) 154/80   Pulse 76   Temp 99.6 F (37.6 C) (Rectal)   Resp 17   Ht 5\' 10"  (1.778 m)   Wt 85 kg   SpO2 94%   BMI 26.89 kg/m   9:29 AM Reassessment performed. Patient appears comfortable.  Wife at bedside.  We discussed results.  Labs personally reviewed and interpreted including: Minimally elevated white blood cell count at 11.8 and minimally low hemoglobin at 12.9 otherwise unremarkable; BMP unremarkable; UA with 21-50 white blood cells.  Patient denies any urinary complaints at the current time.  He does not currently have a indwelling Foley catheter.  He had good urinary output in the urinal at bedside.  Reviewed pertinent lab work and imaging with patient at bedside. Questions answered.   Most current vital signs reviewed and are as follows: BP (!) 154/80   Pulse 76   Temp 99.6 F (37.6 C) (Rectal)   Resp 17   Ht 5\' 10"  (1.778 m)   Wt 85 kg   SpO2 94%   BMI 26.89 kg/m   Plan: Discharge back to his rehab facility.  Will need [facility] to take him back.    Renne Crigler, PA-C 04/14/23 0930    Rondel Baton, MD 04/15/23 1021

## 2023-04-14 NOTE — ED Triage Notes (Signed)
Pt was here on 4/17/4/18 for surgical debridement of an abscess on the butt, coming for continued pain to the area, pt also state he feels jittery all over. No other complaints, pt states his pain is a 5/10.  Medic vitals 192/98 88hr 99%ra 125cbg 19rr

## 2023-04-14 NOTE — ED Provider Notes (Signed)
[name] EMERGENCY DEPARTMENT AT [facility] Provider Note   CSN: [id number] Arrival date & time: 04/14/23  0516     History  Chief Complaint  Patient presents with   Post-op Problem    Jay Tran is a 89 y.o. male.  89 y/o male with hx of HTN, BPH, HLD, and CVA presents to the ED for soreness to his I&D site. Patient is 11 days s/p surgical I&D of L gluteal abscess. States that they treat him "like a dog" at his facility and it has been very frustrating for him. He notes some improvement to the soreness after some nurses "rubbed something all over there" this afternoon. States they were concerned about patient becoming encephalopathic a few days ago, so repeated his urinalysis which has yet to result. He has not had increased drainage from the I&D site, fevers, dysuria. Had penrose drain removed on Monday, per wife. Completed outpatient course of Augmentin 5 days ago.  The history is provided by the patient, the spouse and medical records. No language interpreter was used.       Home Medications Prior to Admission medications   Medication Sig Start Date End Date Taking? Authorizing Provider  acetaminophen (TYLENOL) 500 MG tablet Take 1-2 tablets (500-1,000 mg total) by mouth every 6 (six) hours as needed for mild pain, headache or moderate pain. Patient taking differently: Take 500-1,000 mg by mouth in the morning and at bedtime. 02/26/23   Hongalgi, Maximino Greenland, MD  aspirin EC 81 MG tablet Take 81 mg by mouth in the morning. Swallow whole.    [provider]  atorvastatin (LIPITOR) 40 MG tablet TAKE 1 TABLET BY MOUTH ONCE DAILY AT  6  PM Patient taking differently: Take 40 mg by mouth at bedtime. 03/16/21   Ihor Austin, NP  cyanocobalamin (VITAMIN B12) 1000 MCG tablet Take 1 tablet (1,000 mcg total) by mouth daily. 04/06/23   Glade Lloyd, MD  finasteride (PROSCAR) 5 MG tablet Take 5 mg by mouth at bedtime.     [provider]  lidocaine (LIDODERM) 5  % Place 1 patch onto the skin daily. Remove & Discard patch within 12 hours or as directed by MD 04/06/23   Glade Lloyd, MD  Multiple Vitamins-Minerals (PRESERVISION AREDS 2 PO) Take 1 capsule by mouth 2 (two) times daily.     [provider]  Omega-3 Fatty Acids (FISH OIL) 1200 MG CAPS Take 1,200 mg by mouth daily with breakfast.     [provider]  omeprazole (PRILOSEC OTC) 20 MG tablet Take 40 mg by mouth every evening.    [provider]  ondansetron (ZOFRAN) 8 MG tablet Take 8 mg by mouth every 8 (eight) hours as needed for nausea or vomiting.    [provider]  polyethylene glycol (MIRALAX / GLYCOLAX) 17 g packet Take 17 g by mouth daily as needed for mild constipation.    [provider]  SYSTANE 0.4-0.3 % SOLN Place 1 drop into both eyes 3 (three) times daily as needed (for dryness).    [provider]  Tamsulosin HCl (FLOMAX) 0.4 MG CAPS Take 0.4 mg by mouth at bedtime.     [provider]  tiZANidine (ZANAFLEX) 4 MG tablet Take 1 tablet (4 mg total) by mouth every 8 (eight) hours as needed for muscle spasms. Patient taking differently: Take 4-8 mg by mouth at bedtime. 08/11/18   Tia Alert, MD  Turmeric (QC TUMERIC COMPLEX PO) Take 500 mg by  mouth daily with breakfast.    [provider]      Allergies    Codeine, Other, and Tramadol    Review of Systems   Review of Systems Ten systems reviewed and are negative for acute change, except as noted in the HPI.    Physical Exam Updated Vital Signs BP (!) 160/104   Pulse 82   Temp 99.6 F (37.6 C) (Rectal)   Resp 17   Ht 5\' 10"  (1.778 m)   Wt 85 kg   SpO2 96%   BMI 26.89 kg/m   Physical Exam Vitals and nursing note reviewed.  Constitutional:      General: He is not in acute distress.    Appearance: He is well-developed. He is not diaphoretic.     Comments: Nontoxic appearing and in NAD  HENT:     Head: Normocephalic and atraumatic.  Eyes:      General: No scleral icterus.    Conjunctiva/sclera: Conjunctivae normal.  Cardiovascular:     Rate and Rhythm: Normal rate and regular rhythm.     Pulses: Normal pulses.  Pulmonary:     Effort: Pulmonary effort is normal. No respiratory distress.     Comments: Respirations even and unlabored Genitourinary:    Comments: Exam chaperoned by RN. Minimal drainage from prior I&D site. No erythema, heat to touch, lymphangitic streaking. Incision opening above prior abscess; wife states that this is where drain was previously placed which was removed on Monday. Musculoskeletal:        General: Normal range of motion.     Cervical back: Normal range of motion.  Skin:    General: Skin is warm and dry.     Coloration: Skin is not pale.     Findings: No erythema or rash.  Neurological:     Mental Status: He is alert and oriented to person, place, and time.     Coordination: Coordination normal.     Comments: GCS 15. A&Ox4. Moving all extremities spontaneously. Able to transition in the bed with minimal assistance.  Psychiatric:        Behavior: Behavior normal.     ED Results / Procedures / Treatments   Labs (all labs ordered are listed, but only abnormal results are displayed) Labs Reviewed  URINALYSIS, ROUTINE W REFLEX MICROSCOPIC  CBC  BASIC METABOLIC PANEL    EKG None  Radiology No results found.  Procedures Procedures    Medications Ordered in ED Medications - No data to display  ED Course/ Medical Decision Making/ A&P                             Medical Decision Making Amount and/or Complexity of Data Reviewed Labs: ordered.   This patient presents to the ED for concern of buttock soreness s/p surgical I&D, this involves an extensive number of treatment options, and is a complaint that carries with it a high risk of complications and morbidity.  The differential diagnosis includes routine healing vs cellulitis vs recurrent abscess vs necrotizing infection   Co  morbidities that complicate the patient evaluation  HTN HLD CVA   Additional history obtained:  Additional history obtained from EMS and spouse External records from outside source obtained and reviewed including CBC from 04/06/23 w/normal WBC and stable Hgb of 11.8.   Lab Tests:  CBC, BMP, UA pending   Cardiac Monitoring:  The patient was maintained on a cardiac monitor.  I personally viewed  and interpreted the cardiac monitored which showed an underlying rhythm of: NSR   Medicines ordered and prescription drug management:  I have reviewed the patients home medicines and have made adjustments as needed   Test Considered:  CT pelvis w/contrast   Problem List / ED Course:  Patient presenting for soreness at site of I&D. Wound site appears appropriate with pink tissue, no significant purulence. No surrounding erythema, heat to touch, lymphangitic streaking. Wound above I&D site is where prior penrose drain was in place. Also appears to be healing well with no active drainage. Facility was concerned about developing encephalopathy; however patient not encephalopathic in the ED. Will trend labs with anticipation to d/c if these are stable.   Reevaluation:  After the interventions noted above, I reevaluated the patient and found that they have :stayed the same   Social Determinants of Health:  SNF resident   Dispostion:  Care signed out to [place], PA-C at shift change.         Final Clinical Impression(s) / ED Diagnoses Final diagnoses:  Postoperative pain    Rx / DC Orders ED Discharge Orders     None         Antony Madura, PA-C 04/14/23 0636    Glendora Score, MD 04/15/23 1135

## 2023-05-11 ENCOUNTER — Encounter (INDEPENDENT_AMBULATORY_CARE_PROVIDER_SITE_OTHER): Payer: Medicare Other | Admitting: Ophthalmology

## 2023-05-11 DIAGNOSIS — H35033 Hypertensive retinopathy, bilateral: Secondary | ICD-10-CM | POA: Diagnosis not present

## 2023-05-11 DIAGNOSIS — H3411 Central retinal artery occlusion, right eye: Secondary | ICD-10-CM

## 2023-05-11 DIAGNOSIS — I10 Essential (primary) hypertension: Secondary | ICD-10-CM

## 2023-05-11 DIAGNOSIS — H353231 Exudative age-related macular degeneration, bilateral, with active choroidal neovascularization: Secondary | ICD-10-CM

## 2023-05-11 DIAGNOSIS — H43813 Vitreous degeneration, bilateral: Secondary | ICD-10-CM

## 2023-05-20 ENCOUNTER — Emergency Department (HOSPITAL_COMMUNITY)
Admission: EM | Admit: 2023-05-20 | Discharge: 2023-05-21 | Disposition: A | Discharge status: Home / Self Care | Payer: Medicare Other | Attending: Emergency Medicine | Admitting: Emergency Medicine

## 2023-05-20 ENCOUNTER — Encounter (HOSPITAL_COMMUNITY): Payer: Self-pay

## 2023-05-20 ENCOUNTER — Other Ambulatory Visit: Payer: Self-pay

## 2023-05-20 DIAGNOSIS — Z79899 Other long term (current) drug therapy: Secondary | ICD-10-CM | POA: Insufficient documentation

## 2023-05-20 DIAGNOSIS — R339 Retention of urine, unspecified: Secondary | ICD-10-CM

## 2023-05-20 DIAGNOSIS — E876 Hypokalemia: Secondary | ICD-10-CM | POA: Diagnosis not present

## 2023-05-20 DIAGNOSIS — I10 Essential (primary) hypertension: Secondary | ICD-10-CM | POA: Diagnosis not present

## 2023-05-20 DIAGNOSIS — Z7982 Long term (current) use of aspirin: Secondary | ICD-10-CM | POA: Diagnosis not present

## 2023-05-20 DIAGNOSIS — R55 Syncope and collapse: Secondary | ICD-10-CM | POA: Diagnosis present

## 2023-05-20 LAB — CBC WITH DIFFERENTIAL/PLATELET
Abs Immature Granulocytes: 0.02 10*3/uL (ref 0.00–0.07)
Basophils Absolute: 0.1 10*3/uL (ref 0.0–0.1)
Basophils Relative: 1 %
Eosinophils Absolute: 0.2 10*3/uL (ref 0.0–0.5)
Eosinophils Relative: 2 %
HCT: 36 % — ABNORMAL LOW (ref 39.0–52.0)
Hemoglobin: 12.3 g/dL — ABNORMAL LOW (ref 13.0–17.0)
Immature Granulocytes: 0 %
Lymphocytes Relative: 9 %
Lymphs Abs: 0.7 10*3/uL (ref 0.7–4.0)
MCH: 33 pg (ref 26.0–34.0)
MCHC: 34.2 g/dL (ref 30.0–36.0)
MCV: 96.5 fL (ref 80.0–100.0)
Monocytes Absolute: 0.7 10*3/uL (ref 0.1–1.0)
Monocytes Relative: 9 %
Neutro Abs: 6.4 10*3/uL (ref 1.7–7.7)
Neutrophils Relative %: 79 %
Platelets: 278 10*3/uL (ref 150–400)
RBC: 3.73 MIL/uL — ABNORMAL LOW (ref 4.22–5.81)
RDW: 13.2 % (ref 11.5–15.5)
WBC: 8.1 10*3/uL (ref 4.0–10.5)
nRBC: 0 % (ref 0.0–0.2)

## 2023-05-20 LAB — COMPREHENSIVE METABOLIC PANEL
ALT: 5 U/L (ref 0–44)
AST: 19 U/L (ref 15–41)
Albumin: 3.7 g/dL (ref 3.5–5.0)
Alkaline Phosphatase: 35 U/L — ABNORMAL LOW (ref 38–126)
Anion gap: 11 (ref 5–15)
BUN: 10 mg/dL (ref 8–23)
CO2: 27 mmol/L (ref 22–32)
Calcium: 8.9 mg/dL (ref 8.9–10.3)
Chloride: 95 mmol/L — ABNORMAL LOW (ref 98–111)
Creatinine, Ser: 0.97 mg/dL (ref 0.61–1.24)
GFR, Estimated: >60 mL/min (ref >60)
Glucose, Bld: 136 mg/dL — ABNORMAL HIGH (ref 70–99)
Potassium: 2.8 mmol/L — ABNORMAL LOW (ref 3.5–5.1)
Sodium: 133 mmol/L — ABNORMAL LOW (ref 135–145)
Total Bilirubin: 1.2 mg/dL (ref 0.3–1.2)
Total Protein: 6.3 g/dL — ABNORMAL LOW (ref 6.5–8.1)

## 2023-05-20 LAB — CBG MONITORING, ED: Glucose-Capillary: 138 mg/dL — ABNORMAL HIGH (ref 70–99)

## 2023-05-20 MED ORDER — SODIUM CHLORIDE 0.9 % IV BOLUS (SEPSIS)
1000.0000 mL | Freq: Once | INTRAVENOUS | Status: AC
  Administered 2023-05-21: 1000 mL via INTRAVENOUS

## 2023-05-20 NOTE — ED Triage Notes (Signed)
Pt had a syncopal episode while straining on the toilet. Pt has been having diarrhea after taking miralax for constipation. Pt took an unknown anti-anxiety medication

## 2023-05-20 NOTE — ED Provider Notes (Incomplete)
[place] EMERGENCY DEPARTMENT AT [facility] Provider Note   CSN: [id number] Arrival date & time: 05/20/23  2242     History {Add pertinent medical, surgical, social history, OB history to HPI:1} Chief Complaint  Patient presents with  . Loss of Consciousness    Jay Tran is a 89 y.o. male.  89 year old male brought in by EMS from home, wife at bedside. Patient's wife provides history- patient ate breakfast, did not eat lunch today, was constipated, took mag citrate and ducolax tonight. Patient coughed tonight while in bed, was concerned he had had an accident and wife assisted patient to the bathroom. As patient got to the commode, he sat down and passed out on the commode. Wife called 911, patient started to wake at the time EMS arrived. Recently dc back home from SNF after hospital admission for gluteal abscess, wife gave patient his tizanidine tonight per usual, restarted his Seroquel tonight.  Patient reports feeling tired without complaints or concerns.        Home Medications Prior to Admission medications   Medication Sig Start Date End Date Taking? Authorizing Provider  acetaminophen (TYLENOL) 500 MG tablet Take 1-2 tablets (500-1,000 mg total) by mouth every 6 (six) hours as needed for mild pain, headache or moderate pain. Patient taking differently: Take 500-1,000 mg by mouth in the morning and at bedtime. 02/26/23   Hongalgi, Maximino Greenland, MD  aspirin EC 81 MG tablet Take 81 mg by mouth in the morning. Swallow whole.    [provider]  atorvastatin (LIPITOR) 40 MG tablet TAKE 1 TABLET BY MOUTH ONCE DAILY AT  6  PM Patient taking differently: Take 40 mg by mouth at bedtime. 03/16/21   Ihor Austin, NP  cyanocobalamin (VITAMIN B12) 1000 MCG tablet Take 1 tablet (1,000 mcg total) by mouth daily. Patient not taking: Reported on 04/14/2023 04/06/23   Glade Lloyd, MD  divalproex (DEPAKOTE) 125 MG DR tablet Take 125 mg by mouth 3 (three) times daily.     [provider]  finasteride (PROSCAR) 5 MG tablet Take 5 mg by mouth at bedtime.     [provider]  lidocaine (LIDODERM) 5 % Place 1 patch onto the skin daily. Remove & Discard patch within 12 hours or as directed by MD 04/06/23   Glade Lloyd, MD  LORazepam (ATIVAN) 1 MG tablet Take 1 mg by mouth every 8 (eight) hours.    [provider]  Multiple Vitamins-Minerals (PRESERVISION AREDS 2 PO) Take 1 capsule by mouth 2 (two) times daily.     [provider]  Omega-3 Fatty Acids (FISH OIL) 1200 MG CAPS Take 1,200 mg by mouth daily with breakfast.     [provider]  omeprazole (PRILOSEC OTC) 20 MG tablet Take 40 mg by mouth every evening.    [provider]  ondansetron (ZOFRAN) 8 MG tablet Take 8 mg by mouth every 8 (eight) hours as needed for nausea or vomiting.    [provider]  polyethylene glycol (MIRALAX / GLYCOLAX) 17 g packet Take 17 g by mouth daily as needed for mild constipation.    [provider]  SYSTANE 0.4-0.3 % SOLN Place 1 drop into both eyes 3 (three) times daily as needed (for dryness). Patient not taking: Reported on 04/14/2023    [provider]  Tamsulosin HCl (FLOMAX) 0.4 MG CAPS Take 0.4 mg by mouth at bedtime.     [provider]  tiZANidine (ZANAFLEX) 4 MG tablet  Take 1 tablet (4 mg total) by mouth every 8 (eight) hours as needed for muscle spasms. Patient taking differently: Take 4-8 mg by mouth at bedtime. 08/11/18   Tia Alert, MD  Turmeric (QC TUMERIC COMPLEX PO) Take 500 mg by mouth daily with breakfast. Patient not taking: Reported on 04/14/2023    [provider]      Allergies    Codeine, Other, and Tramadol    Review of Systems   Review of Systems  Physical Exam Updated Vital Signs BP (!) 98/44 (BP Location: Right Arm)   Pulse (!) 59   Temp (!) 97.4 F (36.3 C) (Oral)   Resp 19   Ht 5\' 10"  (1.778 m)   Wt 80.7 kg   SpO2 94%   BMI 25.54 kg/m   Physical Exam  ED Results / Procedures / Treatments   Labs (all labs ordered are listed, but only abnormal results are displayed) Labs Reviewed - No data to display  EKG None  Radiology No results found.  Procedures Procedures  {Document cardiac monitor, telemetry assessment procedure when appropriate:1}  Medications Ordered in ED Medications - No data to display  ED Course/ Medical Decision Making/ A&P   {   Click here for ABCD2, HEART and other calculatorsREFRESH Note before signing :1}                          Medical Decision Making Amount and/or Complexity of Data Reviewed Labs: ordered.   ***  {Document critical care time when appropriate:1} {Document review of labs and clinical decision tools ie heart score, Chads2Vasc2 etc:1}  {Document your independent review of radiology images, and any outside records:1} {Document your discussion with family members, caretakers, and with consultants:1} {Document social determinants of health affecting pt's care:1} {Document your decision making why or why not admission, treatments were needed:1} Final Clinical Impression(s) / ED Diagnoses Final diagnoses:  None    Rx / DC Orders ED Discharge Orders     None

## 2023-05-20 NOTE — ED Provider Notes (Signed)
[place] EMERGENCY DEPARTMENT AT [facility] Provider Note   CSN: [id number] Arrival date & time: 05/20/23  2242     History  Chief Complaint  Patient presents with   Loss of Consciousness    Jay Tran is a 89 y.o. male.  89 year old male brought in by EMS from home, wife at bedside. Patient's wife provides history- patient ate breakfast, did not eat lunch today, was constipated, took mag citrate and ducolax tonight. Patient coughed tonight while in bed, was concerned he had had an accident and wife assisted patient to the bathroom. As patient got to the commode, he sat down and passed out on the commode. Wife called 911, patient started to wake at the time EMS arrived. Recently dc back home from SNF after hospital admission for gluteal abscess, wife gave patient his tizanidine tonight per usual, restarted his Seroquel tonight.  Patient reports feeling tired without complaints or concerns.        Home Medications Prior to Admission medications   Medication Sig Start Date End Date Taking? Authorizing Provider  acetaminophen (TYLENOL) 500 MG tablet Take 1-2 tablets (500-1,000 mg total) by mouth every 6 (six) hours as needed for mild pain, headache or moderate pain. Patient taking differently: Take 500-1,000 mg by mouth in the morning and at bedtime. 02/26/23   Hongalgi, Maximino Greenland, MD  aspirin EC 81 MG tablet Take 81 mg by mouth in the morning. Swallow whole.    [provider]  atorvastatin (LIPITOR) 40 MG tablet TAKE 1 TABLET BY MOUTH ONCE DAILY AT  6  PM Patient taking differently: Take 40 mg by mouth at bedtime. 03/16/21   Ihor Austin, NP  cyanocobalamin (VITAMIN B12) 1000 MCG tablet Take 1 tablet (1,000 mcg total) by mouth daily. Patient not taking: Reported on 04/14/2023 04/06/23   Glade Lloyd, MD  divalproex (DEPAKOTE) 125 MG DR tablet Take 125 mg by mouth 3 (three) times daily.    [provider]  finasteride (PROSCAR) 5 MG tablet Take 5 mg by  mouth at bedtime.     [provider]  lidocaine (LIDODERM) 5 % Place 1 patch onto the skin daily. Remove & Discard patch within 12 hours or as directed by MD 04/06/23   Glade Lloyd, MD  LORazepam (ATIVAN) 1 MG tablet Take 1 mg by mouth every 8 (eight) hours.    [provider]  Multiple Vitamins-Minerals (PRESERVISION AREDS 2 PO) Take 1 capsule by mouth 2 (two) times daily.     [provider]  Omega-3 Fatty Acids (FISH OIL) 1200 MG CAPS Take 1,200 mg by mouth daily with breakfast.     [provider]  omeprazole (PRILOSEC OTC) 20 MG tablet Take 40 mg by mouth every evening.    [provider]  ondansetron (ZOFRAN) 8 MG tablet Take 8 mg by mouth every 8 (eight) hours as needed for nausea or vomiting.    [provider]  polyethylene glycol (MIRALAX / GLYCOLAX) 17 g packet Take 17 g by mouth daily as needed for mild constipation.    [provider]  SYSTANE 0.4-0.3 % SOLN Place 1 drop into both eyes 3 (three) times daily as needed (for dryness). Patient not taking: Reported on 04/14/2023    [provider]  Tamsulosin HCl (FLOMAX) 0.4 MG CAPS Take 0.4 mg by mouth at bedtime.     [provider]  tiZANidine (ZANAFLEX) 4 MG tablet Take 1 tablet (4 mg total) by mouth every  8 (eight) hours as needed for muscle spasms. Patient taking differently: Take 4-8 mg by mouth at bedtime. 08/11/18   Tia Alert, MD  Turmeric (QC TUMERIC COMPLEX PO) Take 500 mg by mouth daily with breakfast. Patient not taking: Reported on 04/14/2023    [provider]      Allergies    Codeine, Other, and Tramadol    Review of Systems   Review of Systems Negative except as per HPI Physical Exam Updated Vital Signs BP (!) 167/81 (BP Location: Right Arm)   Pulse 63   Temp 98.4 F (36.9 C) (Oral)   Resp 19   Ht 5\' 10"  (1.778 m)   Wt 80.7 kg   SpO2 95%   BMI 25.54 kg/m  Physical Exam Vitals and nursing note reviewed.   Constitutional:      General: He is not in acute distress.    Appearance: He is well-developed. He is not diaphoretic.  HENT:     Head: Normocephalic and atraumatic.     Mouth/Throat:     Mouth: Mucous membranes are moist.  Eyes:     Extraocular Movements: Extraocular movements intact.     Pupils: Pupils are equal, round, and reactive to light.  Cardiovascular:     Rate and Rhythm: Normal rate and regular rhythm.     Heart sounds: Normal heart sounds.  Pulmonary:     Effort: Pulmonary effort is normal.     Breath sounds: Normal breath sounds.  Abdominal:     Palpations: Abdomen is soft.     Tenderness: There is no abdominal tenderness.  Musculoskeletal:        General: No swelling, tenderness or deformity.     Cervical back: Normal range of motion and neck supple.     Comments: Limited ROM left shoulder due to prior injury, able to hold extremities against resistance   Skin:    General: Skin is warm and dry.     Findings: No erythema or rash.  Neurological:     Mental Status: He is alert and oriented to person, place, and time.  Psychiatric:        Behavior: Behavior normal.     ED Results / Procedures / Treatments   Labs (all labs ordered are listed, but only abnormal results are displayed) Labs Reviewed  CBC WITH DIFFERENTIAL/PLATELET - Abnormal; Notable for the following components:      Result Value   RBC 3.73 (*)    Hemoglobin 12.3 (*)    HCT 36.0 (*)    All other components within normal limits  COMPREHENSIVE METABOLIC PANEL - Abnormal; Notable for the following components:   Sodium 133 (*)    Potassium 2.8 (*)    Chloride 95 (*)    Glucose, Bld 136 (*)    Total Protein 6.3 (*)    Alkaline Phosphatase 35 (*)    All other components within normal limits  VALPROIC ACID LEVEL - Abnormal; Notable for the following components:   Valproic Acid Lvl <10 (*)    All other components within normal limits  CBG MONITORING, ED - Abnormal; Notable for the following  components:   Glucose-Capillary 138 (*)    All other components within normal limits  URINALYSIS, ROUTINE W REFLEX MICROSCOPIC    EKG EKG Interpretation  Date/Time:  Sunday May 21 2023 00:04:34 EDT Ventricular Rate:  62 PR Interval:  203 QRS Duration: 110 QT Interval:  453 QTC Calculation: 460 R Axis:   -8 Text Interpretation: Sinus  rhythm RSR' in V1 or V2, right VCD or RVH Confirmed by Zadie Rhine ([id number]) on 05/21/2023 12:24:20 AM  Radiology No results found.  Procedures Procedures    Medications Ordered in ED Medications  sodium chloride 0.9 % bolus 1,000 mL (0 mLs Intravenous Stopped 05/21/23 0120)  potassium chloride SA (KLOR-CON M) CR tablet 40 mEq (40 mEq Oral Given 05/21/23 0020)  sodium chloride 0.9 % bolus 1,000 mL (0 mLs Intravenous Stopped 05/21/23 0456)  lidocaine (XYLOCAINE) 2 % jelly 1 Application (1 Application Topical Given 05/21/23 0615)    ED Course/ Medical Decision Making/ A&P                             Medical Decision Making Amount and/or Complexity of Data Reviewed Labs: ordered. ECG/medicine tests: ordered.  Risk Prescription drug management.   This patient presents to the ED for concern of syncope, this involves an extensive number of treatment options, and is a complaint that carries with it a high risk of complications and morbidity.  The differential diagnosis includes but not limited to arrhythmia, hypotension/orthostatic, metabolic derangement,    Co morbidities that complicate the patient evaluation  BPH, hypertension, hyperlipidemia, DVT, GERD, CVA   Additional history obtained:  Additional history obtained from EMS, wife at bedside who contributes to history as above External records from outside source obtained and reviewed including prior labs on file for comparison   Lab Tests:  I Ordered, and personally interpreted labs.  The pertinent results include: CMP with hypokalemia potassium 2.8, replaced orally.  Valproic acid  less than 10, wife states this medication has been discontinued.  Urinalysis is unremarkable.  CBC with normal white count.  CBG normal at 138.   Cardiac Monitoring: / EKG:  The patient was maintained on a cardiac monitor.  I personally viewed and interpreted the cardiac monitored which showed an underlying rhythm of: Sinus rhythm, rate 62   Consultations Obtained:  I requested consultation with the ER attending, Dr. Bebe Shaggy,  and discussed lab and imaging findings as well as pertinent plan - they recommend: Recommend hold Seroquel until further evaluation with PCP.  Monitor blood pressure.  May discharge with leg bag for urinary retention if patient is able to ambulate at baseline.   Problem List / ED Course / Critical interventions / Medication management  89 year old male brought in by EMS from home after syncopal episode. Patient took his seroquel and zanaflex and went to bed. He coughed in bed, was concerned he had a bowel accident (had taken meds to treat this earlier), and made his way to the bathroom with his wife. Patient was feeling weak by the time he made it to the bathroom, sat on the commode and passed out.  Patient did not fall from the commode.  He reports feeling tired otherwise denies complaints or concerns.  Labs reveal hypokalemia, replaced orally.  He was unable to provide a urine sample, bladder scan showed only 9 cc in his bladder and was provided with IV fluids.  After 1 L, had not voided, was provided with a second liter and was able to void a small amount.  As patient was unable to void any more, a bladder scan was obtained showing 760 cc retained urine postvoid.  Discussed with patient and wife, decision made to place a Foley catheter with leg bag for discharge to follow-up with his urologist.  Reports history of BPH and prior retention.  Patient  is able to ambulate at baseline.  Patient and wife are comfortable with plan for discharge home at this time. I ordered  medication including IV fluids, potassium for hypokalemia, inability to void with only 9cc on bladder scan  Reevaluation of the patient after these medicines showed that the patient improved I have reviewed the patients home medicines and have made adjustments as needed   Social Determinants of Health:  Lives with wife   Test / Admission - Considered:  Dc home with wife to follow up with care team as discussed.          Final Clinical Impression(s) / ED Diagnoses Final diagnoses:  Syncope, unspecified syncope type  Hypokalemia  Urinary retention    Rx / DC Orders ED Discharge Orders     None         Jeannie Fend, PA-C 05/21/23 [id number]    Zadie Rhine, MD 05/21/23 [id number]

## 2023-05-21 DIAGNOSIS — R55 Syncope and collapse: Secondary | ICD-10-CM | POA: Diagnosis not present

## 2023-05-21 LAB — URINALYSIS, ROUTINE W REFLEX MICROSCOPIC
Bilirubin Urine: NEGATIVE
Glucose, UA: NEGATIVE mg/dL
Hgb urine dipstick: NEGATIVE
Ketones, ur: NEGATIVE mg/dL
Leukocytes,Ua: NEGATIVE
Nitrite: NEGATIVE
Protein, ur: NEGATIVE mg/dL
Specific Gravity, Urine: 1.008 (ref 1.005–1.030)
pH: 7 (ref 5.0–8.0)

## 2023-05-21 LAB — VALPROIC ACID LEVEL: Valproic Acid Lvl: <10 ug/mL — ABNORMAL LOW (ref 50.0–100.0)

## 2023-05-21 MED ORDER — SODIUM CHLORIDE 0.9 % IV BOLUS
1000.0000 mL | Freq: Once | INTRAVENOUS | Status: AC
  Administered 2023-05-21: 1000 mL via INTRAVENOUS

## 2023-05-21 MED ORDER — POTASSIUM CHLORIDE CRYS ER 20 MEQ PO TBCR
40.0000 meq | EXTENDED_RELEASE_TABLET | Freq: Once | ORAL | Status: AC
  Administered 2023-05-21: 40 meq via ORAL
  Filled 2023-05-21: qty 2

## 2023-05-21 MED ORDER — LIDOCAINE HCL URETHRAL/MUCOSAL 2 % EX GEL
1.0000 | Freq: Once | CUTANEOUS | Status: AC
  Administered 2023-05-21: 1 via TOPICAL
  Filled 2023-05-21: qty 11

## 2023-05-21 NOTE — Discharge Instructions (Addendum)
Recheck with your primary care provider. Follow up with urology- call to schedule an appointment.   Hold Seroquel until discussed further with your doctor. Monitor your blood pressure.  Return to the ER for worsening or concerning symptoms.

## 2023-05-21 NOTE — ED Notes (Signed)
Pt ambulated fairly well using walker. Eulah Pont PA notified.

## 2023-07-28 ENCOUNTER — Ambulatory Visit: Payer: Medicare Other | Attending: Family Medicine

## 2023-07-28 ENCOUNTER — Other Ambulatory Visit: Payer: Self-pay

## 2023-07-28 DIAGNOSIS — M6281 Muscle weakness (generalized): Secondary | ICD-10-CM | POA: Diagnosis present

## 2023-07-28 DIAGNOSIS — R2689 Other abnormalities of gait and mobility: Secondary | ICD-10-CM | POA: Diagnosis present

## 2023-07-28 DIAGNOSIS — R296 Repeated falls: Secondary | ICD-10-CM

## 2023-07-28 DIAGNOSIS — R262 Difficulty in walking, not elsewhere classified: Secondary | ICD-10-CM | POA: Diagnosis present

## 2023-07-28 DIAGNOSIS — R2681 Unsteadiness on feet: Secondary | ICD-10-CM

## 2023-07-28 NOTE — Therapy (Signed)
OUTPATIENT PHYSICAL THERAPY NEURO EVALUATION   Patient Name: Jay Tran MRN: [id number] DOB:05-18-1934, 89 y.o., male Today's Date: 07/28/2023   PCP: Dr. Windle Guard REFERRING PROVIDER: Dr. Windle Guard  END OF SESSION:  PT End of Session - 07/28/23 1316     Visit Number 1    Number of Visits 13    Date for PT Re-Evaluation 09/22/23    Authorization Type Medicare    Progress Note Due on Visit 10    PT Start Time 1315    PT Stop Time 1400    PT Time Calculation (min) 45 min    Equipment Utilized During Treatment Gait belt    Activity Tolerance Patient tolerated treatment well    Behavior During Therapy WFL for tasks assessed/performed             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 (HCC)    Hx: of after 2013 back surgery   GERD (gastroesophageal reflux disease)    Hearing aid worn    Hx: of right ear   Hyperlipemia    Hypertension    Lumbar herniated disc    Lumbar stenosis    Macular degeneration right eye dry   PONV (postoperative nausea and vomiting)    takes iv nausea meds    Seasonal allergies    Hx: of   Stroke [facility])    Past Surgical History:  Procedure Laterality Date   BACK SURGERY  2013, x 3 with dr Yetta Barre 2014, 2016 and 2019   x 4, takes back injections monthly   COLONOSCOPY     Hx: of   ENDARTERECTOMY Right 02/07/2020   Procedure: ENDARTERECTOMY CAROTID;  Surgeon: Nada Libman, MD;  Location: [facility] OR;  Service: Vascular;  Laterality: Right;   EYE SURGERY     bil catarcts 02/2016   HERNIA REPAIR  several yrs ago   inguinal   INCISION AND DRAINAGE ABSCESS N/A 04/02/2023   Procedure: INCISION AND DRAINAGE,  LEFT GLUTEAL ABSCESS;  Surgeon: Sheliah Hatch De Blanch, MD;  Location: [facility] OR;  Service: General;  Laterality: N/A;   LUMBAR LAMINECTOMY  10/26/2012   Procedure: MICRODISCECTOMY LUMBAR  LAMINECTOMY;  Surgeon: Eldred Manges, MD;  Location: [facility] OR;  Service: Orthopedics;  Laterality: Right;  Right L4-5 Microdiscectomy   LUMBAR WOUND DEBRIDEMENT N/A 09/15/2021   Procedure: revision of thoracic incision, Irrigation and debridement of battery pocket wound;  Surgeon: Tia Alert, MD;  Location: [facility] OR;  Service: Neurosurgery;  Laterality: N/A;   MAXIMUM ACCESS (MAS)POSTERIOR LUMBAR INTERBODY FUSION (PLIF) 1 LEVEL N/A 04/15/2015   Procedure: Maximum Access Surgery Posterior Lumbar Interbody Fusion Lumbar two-Lumbar three extension of hardware;  Surgeon: Tia Alert, MD;  Location: [facility] NEURO ORS;  Service: Neurosurgery;  Laterality: N/A;   PATCH ANGIOPLASTY Right 02/07/2020   Procedure: PATCH ANGIOPLASTY USING Livia Snellen BIOLOGIC PATCH;  Surgeon: Nada Libman, MD;  Location: [facility] OR;  Service: Vascular;  Laterality: Right;   ROTATOR CUFF REPAIR     right shoulder - 3 times   SPINAL CORD STIMULATOR INSERTION  08/11/2021   TONSILLECTOMY  as child   TRANSURETHRAL RESECTION OF BLADDER TUMOR N/A 02/24/2017   Procedure: TRANSURETHRAL RESECTION OF BLADDER TUMOR (TURBT);  Surgeon: Sebastian Ache, MD;  Location: [facility] ORS;  Service: Urology;  Laterality: N/A;   TRANSURETHRAL  RESECTION OF PROSTATE N/A 11/20/2020   Procedure: TRANSURETHRAL RESECTION OF THE PROSTATE (TURP);  Surgeon: Sebastian Ache, MD;  Location: [facility];  Service: Urology;  Laterality: N/A;   Patient Active Problem List   Diagnosis Date Noted   Perianal abscess 04/02/2023   Hyponatremia 04/02/2023   Delirium 04/01/2023   Weakness 04/01/2023   Acute cystitis without hematuria 02/23/2023   Syncope and collapse 02/22/2023   AKI (acute kidney injury) (HCC) 02/22/2023   Unilateral primary osteoarthritis, right hip 03/30/2022   Prostatic hypertrophy 11/20/2020   Internal carotid artery stenosis 02/05/2020   Essential hypertension 02/05/2020   Benign prostatic hyperplasia with urinary retention 02/05/2020    GERD (gastroesophageal reflux disease) 02/05/2020   Chronic back pain 02/05/2020   Vision loss of right eye 02/04/2020   Wound drainage 08/20/2018   Chronic pain of both shoulders 02/22/2017   S/P lumbar spinal fusion 04/15/2015   HNP (herniated nucleus pulposus), lumbar 10/26/2012    Class: Diagnosis of    ONSET DATE: 03/31/23  REFERRING DIAG: R26.89 (ICD-10-CM) - Balance disorder   THERAPY DIAG:  Unsteadiness on feet  Repeated falls  Muscle weakness (generalized)  Difficulty in walking, not elsewhere classified  Other abnormalities of gait and mobility  Rationale for Evaluation and Treatment: Rehabilitation  SUBJECTIVE:                                                                                                                                                                                             SUBJECTIVE STATEMENT: Was hospitalized in March due to fall. Had a course of SNF and returned home with [facility]. Pt went to ED on 03/31/23 for generalized weakness. Imaging was performed and it revealed perirectal gluteal abscess for which he had surgery done and was placed on antibiotics. Was brought back to ED on 04/14/23 from rehab facility as he continued to have pain in the area. He was discharged back to SNF. Had another bout of PT/OT and nursing. Pt had another ED visit due to syncopal epipsode on 05/20/23. Nothing significant was found. Pt was discharged home. HH PT ended couple weeks ago. Pt had significant fall in May 2023 where he tore his L rotator cuff completely. Since then he is unable to lift the arm. Pt has no significant visual acuity in his R eye (pt couldn't see PT with L eye closed in front with just relying on R eye. Pt could see PT little bit in the 30 deg R peripheral vision with left eye closed.  Pt has been using  a walker for >2-3 years. Pt accompanied by:  wife  PERTINENT  HISTORY: Patient with a history of BPH w/ urinary retention, chronic pain s/p lumbar  fusion w/ spinal cord stimulator (~2022), HTN, CAD, DVT, HLD, and CVA (~)2021  PAIN:  Are you having pain? Yes: NPRS scale: 3/10 Pain location: lower back Pain description: achy Aggravating factors: in the morning Relieving factors: OTC pain meds, spinal stimulator  PRECAUTIONS: Fall  RED FLAGS: None   WEIGHT BEARING RESTRICTIONS: No  FALLS: Has patient fallen in last 6 months? Yes. Number of falls 2 falls in July: one of the fall was where he was turning to come around the corner and he fell (unsure if his feet got caught up or legs collapsed); second fall was in the bathroom where he thinks his foot got caught and he fell.   LIVING ENVIRONMENT: Lives with: lives with their spouse Lives in: House/apartment Stairs: Yes: External: 4 steps; on left going up Has following equipment at home: Dan Humphreys - 2 wheeled and [employer] - 4 wheeled  PLOF:  Needs assistance with getting in and out of tub, cooking/cleaning  PATIENT GOALS: improve walking  OBJECTIVE:    COGNITION: Overall cognitive status: Within functional limits for tasks assessed    LOWER EXTREMITY ROM:     Active  Right Eval Left Eval  Hip flexion    Hip extension    Hip abduction    Hip adduction    Hip internal rotation    Hip external rotation    Knee flexion    Knee extension    Ankle dorsiflexion    Ankle plantarflexion    Ankle inversion    Ankle eversion     (Blank rows = not tested)  LOWER EXTREMITY MMT:    MMT Right Eval Left Eval  Hip flexion 5 5  Hip extension    Hip abduction    Hip adduction    Hip internal rotation    Hip external rotation    Knee flexion 5 5  Knee extension 5 5  Ankle dorsiflexion 5 5  Ankle plantarflexion    Ankle inversion    Ankle eversion    (Blank rows = not tested)  BED MOBILITY:  Independent  TRANSFERS: Assistive device utilized: Environmental consultant - 2 wheeled  Sit to stand: Modified independence Stand to sit: Modified independence Chair to chair: Modified  independence   GAIT: Gait pattern: decreased stance time- Left, decreased stride length, decreased hip/knee flexion- Right, decreased hip/knee flexion- Left, decreased ankle dorsiflexion- Left, Left foot flat, shuffling, antalgic, lateral lean- Right, decreased trunk rotation, wide BOS, and poor foot clearance- Left Distance walked: 115' Assistive device utilized: Environmental consultant - 2 wheeled Level of assistance: Modified independence   FUNCTIONAL TESTS:  5 times sit to stand: 15 sec (no UE support, WNL for age related norms) Timed up and go (TUG): 29 sec RW 10 meter walk test: 0.47 m/s with RW Berg Balance Scale: 41/56   [facility] PT Assessment - 07/28/23 0001       Standardized Balance Assessment   Standardized Balance Assessment Berg Balance Test      Berg Balance Test   Sit to Stand Able to stand without using hands and stabilize independently    Standing Unsupported Able to stand safely 2 minutes    Sitting with Back Unsupported but Feet Supported on Floor or Stool Able to sit safely and securely 2 minutes    Stand to Sit Sits safely with minimal use of hands    Transfers Able to transfer safely, minor use of hands  Standing Unsupported with Eyes Closed Able to stand 10 seconds safely    Standing Unsupported with Feet Together Able to place feet together independently and stand 1 minute safely    From Standing, Reach Forward with Outstretched Arm Can reach forward >5 cm safely (2")    From Standing Position, Pick up Object from Floor Able to pick up shoe, needs supervision    From Standing Position, Turn to Look Behind Over each Shoulder Looks behind from both sides and weight shifts well    Turn 360 Degrees Able to turn 360 degrees safely but slowly    Standing Unsupported, Alternately Place Feet on Step/Stool Needs assistance to keep from falling or unable to try    Standing Unsupported, One Foot in Front Able to take small step independently and hold 30 seconds    Standing on One Leg  Unable to try or needs assist to prevent fall    Total Score 41    Berg comment: 41/56             TODAY'S TREATMENT:                                                                                                                              DATE:    Pt and wife educated that goal of walker to cane may not be best for patient due to his complex medical history and considering his previous falls and symptoms. We discussed priority of his safety and balance prevention and being able to function more (prolonged standing/walking with minimizing pain/weakness).  We discussed POC PATIENT EDUCATION: Education details: see above Person educated: Patient Education method: Explanation Education comprehension: verbalized understanding  HOME EXERCISE PROGRAM: TBD  GOALS: Goals reviewed with patient? Yes  SHORT TERM GOALS: Target date: 08/25/2023   Patient will be able to perform standing forward reach to 5" to improve static balance. Baseline: can safey reach 2" (07/28/23) Goal status: INITIAL  2.  Pt will be I and compliant with HEP to self manage symtpoms Baseline: TBD Goal status: INITIAL  3.   LONG TERM GOALS: Target date: 09/22/2023   Patient will demo BBS score of >48/56 to improve static balance and reduce fall risk  Baseline: 41/56 (07/28/23) Goal status: INITIAL  2.  Pt will demo gait speed of >0.55 m/s with RW to improve functional gait and reduce fall risk. Baseline: 0.47 m/s Goal status: INITIAL  3.  Pt will demo TUG of <20 sec with RW to improve functional mobility and reduce fall risk  Baseline: 29 sec with RW Goal status: INITIAL  4.  Pt will be I and compliant with HEP to self manage symptoms. Baseline: TBD Goal status: INITIAL  5. ASSESSMENT:  CLINICAL IMPRESSION: Patient is a 89 y.o. male who was seen today for physical therapy evaluation and treatment for gait and balance disorder. Patient demonstrates decreased functional mobility, decreased gait speed  and increased fall risk based on TUG, gait  speed and Berg Balance Scale scores. Patient will benefit from skilled PT to improve functional gait, transfers, balance to reduce fall risk and improve functional independence. .   OBJECTIVE IMPAIRMENTS: Abnormal gait, decreased balance, decreased coordination, decreased mobility, difficulty walking, decreased safety awareness, hypomobility, increased muscle spasms, impaired flexibility, impaired UE functional use, impaired vision/preception, postural dysfunction, and pain.   ACTIVITY LIMITATIONS: carrying, lifting, standing, squatting, stairs, transfers, bathing, reach over head, and hygiene/grooming  PARTICIPATION LIMITATIONS: meal prep, cleaning, laundry, driving, community activity, and yard work  PERSONAL FACTORS: Age, Time since onset of injury/illness/exacerbation, and 3+ comorbidities: Impaired vision in R>L eye, lumbar fusion, L complete rotator cuff tear and inability to flex arm  are also affecting patient's functional outcome.   REHAB POTENTIAL: Good  CLINICAL DECISION MAKING: Stable/uncomplicated  EVALUATION COMPLEXITY: Moderate  PLAN:  PT FREQUENCY: 2x/week  PT DURATION: 8 weeks  PLANNED INTERVENTIONS: Therapeutic exercises, Therapeutic activity, Neuromuscular re-education, Balance training, Gait training, Patient/Family education, Self Care, Joint mobilization, Stair training, Orthotic/Fit training, Aquatic Therapy, Cryotherapy, Moist heat, Manual therapy, and Re-evaluation  PLAN FOR NEXT SESSION:  Issue HEP with partial tandem balance, standing marching (unsupported), functional reaching etc.   Ileana Ladd, PT 07/28/2023, 3:33 PM

## 2023-08-08 ENCOUNTER — Ambulatory Visit: Payer: Medicare Other

## 2023-08-08 DIAGNOSIS — R2681 Unsteadiness on feet: Secondary | ICD-10-CM

## 2023-08-08 DIAGNOSIS — M6281 Muscle weakness (generalized): Secondary | ICD-10-CM

## 2023-08-08 DIAGNOSIS — R262 Difficulty in walking, not elsewhere classified: Secondary | ICD-10-CM

## 2023-08-08 DIAGNOSIS — R2689 Other abnormalities of gait and mobility: Secondary | ICD-10-CM

## 2023-08-08 DIAGNOSIS — R296 Repeated falls: Secondary | ICD-10-CM

## 2023-08-08 NOTE — Therapy (Signed)
OUTPATIENT PHYSICAL THERAPY NEURO EVALUATION   Patient Name: Jay Tran MRN: [id number] DOB:[birth year], 89 y.o., male Today's Date: 08/08/2023   PCP: Dr. Windle Guard REFERRING PROVIDER: Dr. Windle Guard  END OF SESSION:  PT End of Session - 08/08/23 1435     Visit Number 2    Number of Visits 13    Date for PT Re-Evaluation 09/22/23    Authorization Type Medicare    Progress Note Due on Visit 10    PT Start Time 1437    PT Stop Time 1519    PT Time Calculation (min) 42 min    Equipment Utilized During Treatment Gait belt    Activity Tolerance Patient tolerated treatment well             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 (HCC)    Hx: of after 2013 back surgery   GERD (gastroesophageal reflux disease)    Hearing aid worn    Hx: of right ear   Hyperlipemia    Hypertension    Lumbar herniated disc    Lumbar stenosis    Macular degeneration right eye dry   PONV (postoperative nausea and vomiting)    takes iv nausea meds    Seasonal allergies    Hx: of   Stroke [facility])    Past Surgical History:  Procedure Laterality Date   BACK SURGERY  2013, x 3 with dr Yetta Barre 2014, 2016 and 2019   x 4, takes back injections monthly   COLONOSCOPY     Hx: of   ENDARTERECTOMY Right 02/07/2020   Procedure: ENDARTERECTOMY CAROTID;  Surgeon: Nada Libman, MD;  Location: [facility] OR;  Service: Vascular;  Laterality: Right;   EYE SURGERY     bil catarcts 02/2016   HERNIA REPAIR  several yrs ago   inguinal   INCISION AND DRAINAGE ABSCESS N/A 04/02/2023   Procedure: INCISION AND DRAINAGE,  LEFT GLUTEAL ABSCESS;  Surgeon: Sheliah Hatch De Blanch, MD;  Location: [facility] OR;  Service: General;  Laterality: N/A;   LUMBAR LAMINECTOMY  10/26/2012   Procedure: MICRODISCECTOMY LUMBAR LAMINECTOMY;  Surgeon: Eldred Manges, MD;  Location: [facility] OR;   Service: Orthopedics;  Laterality: Right;  Right L4-5 Microdiscectomy   LUMBAR WOUND DEBRIDEMENT N/A 09/15/2021   Procedure: revision of thoracic incision, Irrigation and debridement of battery pocket wound;  Surgeon: Tia Alert, MD;  Location: [facility] OR;  Service: Neurosurgery;  Laterality: N/A;   MAXIMUM ACCESS (MAS)POSTERIOR LUMBAR INTERBODY FUSION (PLIF) 1 LEVEL N/A 04/15/2015   Procedure: Maximum Access Surgery Posterior Lumbar Interbody Fusion Lumbar two-Lumbar three extension of hardware;  Surgeon: Tia Alert, MD;  Location: [facility] NEURO ORS;  Service: Neurosurgery;  Laterality: N/A;   PATCH ANGIOPLASTY Right 02/07/2020   Procedure: PATCH ANGIOPLASTY USING Livia Snellen BIOLOGIC PATCH;  Surgeon: Nada Libman, MD;  Location: [facility] OR;  Service: Vascular;  Laterality: Right;   ROTATOR CUFF REPAIR     right shoulder - 3 times   SPINAL CORD STIMULATOR INSERTION  08/11/2021   TONSILLECTOMY  as child   TRANSURETHRAL RESECTION OF BLADDER TUMOR N/A 02/24/2017   Procedure: TRANSURETHRAL RESECTION OF BLADDER TUMOR (TURBT);  Surgeon: Sebastian Ache, MD;  Location: [facility] ORS;  Service: Urology;  Laterality: N/A;   TRANSURETHRAL RESECTION OF PROSTATE N/A 11/20/2020   Procedure: TRANSURETHRAL RESECTION  OF THE PROSTATE (TURP);  Surgeon: Sebastian Ache, MD;  Location: [facility];  Service: Urology;  Laterality: N/A;   Patient Active Problem List   Diagnosis Date Noted   Perianal abscess 04/02/2023   Hyponatremia 04/02/2023   Delirium 04/01/2023   Weakness 04/01/2023   Acute cystitis without hematuria 02/23/2023   Syncope and collapse 02/22/2023   AKI (acute kidney injury) (HCC) 02/22/2023   Unilateral primary osteoarthritis, right hip 03/30/2022   Prostatic hypertrophy 11/20/2020   Internal carotid artery stenosis 02/05/2020   Essential hypertension 02/05/2020   Benign prostatic hyperplasia with urinary retention 02/05/2020   GERD (gastroesophageal reflux disease) 02/05/2020   Chronic  back pain 02/05/2020   Vision loss of right eye 02/04/2020   Wound drainage 08/20/2018   Chronic pain of both shoulders 02/22/2017   S/P lumbar spinal fusion 04/15/2015   HNP (herniated nucleus pulposus), lumbar 10/26/2012    Class: Diagnosis of    ONSET DATE: 03/31/23  REFERRING DIAG: R26.89 (ICD-10-CM) - Balance disorder   THERAPY DIAG:  Unsteadiness on feet  Repeated falls  Muscle weakness (generalized)  Difficulty in walking, not elsewhere classified  Other abnormalities of gait and mobility  Rationale for Evaluation and Treatment: Rehabilitation  SUBJECTIVE:                                                                                                                                                                                             SUBJECTIVE STATEMENT: Patient arrives to clinic with wife, using RW. Forward flexed posture. Denies pain after eval. Denies falls.   PERTINENT HISTORY: Patient with a history of BPH w/ urinary retention, chronic pain s/p lumbar fusion w/ spinal cord stimulator (~2022), HTN, CAD, DVT, HLD, and CVA (~)2021  PAIN:  Are you having pain? No  PRECAUTIONS: Fall  PATIENT GOALS: improve walking   TODAY'S TREATMENT:                                                                                                                              Therex: -scifit level 1 x10 mins  B UE/LE for improved CV endurance and strength  - HEP (see below)   -modified tandem stance to semi-tandem   PATIENT EDUCATION: Education details: initial HEP Person educated: Patient Education method: Explanation Education comprehension: verbalized understanding  HOME EXERCISE PROGRAM: Access Code: [email or web address] URL: [email or web address] Date: 08/08/2023 Prepared by: Merry Lofty  Exercises - Supine March  - 1 x daily - 7 x weekly - 3 sets - 10 reps - Sit to Stand  - 1 x daily - 7 x weekly - 2 sets - 5 reps - Seated March  - 1 x daily - 7 x  weekly - 3 sets - 10 reps - Standing Tandem Balance with Counter Support  - 1 x daily - 7 x weekly - 3 sets - 3 reps - 30s hold - Narrow Stance with Unilateral Counter Support  - 1 x daily - 7 x weekly - 3 sets - 3 reps - 30s hold - Standing March with Counter Support  - 1 x daily - 7 x weekly - 3 sets - 10 reps - Backwards Walking  - 1 x daily - 7 x weekly - 3 sets - 10 reps  GOALS: Goals reviewed with patient? Yes  SHORT TERM GOALS: Target date: 08/25/2023   Patient will be able to perform standing forward reach to 5" to improve static balance. Baseline: can safey reach 2" (07/28/23) Goal status: INITIAL  2.  Pt will be I and compliant with HEP to self manage symtpoms Baseline: TBD Goal status: INITIAL  3.   LONG TERM GOALS: Target date: 09/22/2023   Patient will demo BBS score of >48/56 to improve static balance and reduce fall risk  Baseline: 41/56 (07/28/23) Goal status: INITIAL  2.  Pt will demo gait speed of >0.55 m/s with RW to improve functional gait and reduce fall risk. Baseline: 0.47 m/s Goal status: INITIAL  3.  Pt will demo TUG of <20 sec with RW to improve functional mobility and reduce fall risk  Baseline: 29 sec with RW Goal status: INITIAL  4.  Pt will be I and compliant with HEP to self manage symptoms. Baseline: TBD Goal status: INITIAL  ASSESSMENT: CLINICAL IMPRESSION: Patient seen for skilled PT session with emphasis on establishing HEP. Patient demonstrating fair balance strategies with standing balance tasks and would benefit from further development in this area to improve fall risk. Patients posture, vision, impaired sensation and hearing complicate patients ability to accurately complete tasks and avoid obstacles. Continue POC.   OBJECTIVE IMPAIRMENTS: Abnormal gait, decreased balance, decreased coordination, decreased mobility, difficulty walking, decreased safety awareness, hypomobility, increased muscle spasms, impaired flexibility, impaired UE  functional use, impaired vision/preception, postural dysfunction, and pain.   ACTIVITY LIMITATIONS: carrying, lifting, standing, squatting, stairs, transfers, bathing, reach over head, and hygiene/grooming  PARTICIPATION LIMITATIONS: meal prep, cleaning, laundry, driving, community activity, and yard work  PERSONAL FACTORS: Age, Time since onset of injury/illness/exacerbation, and 3+ comorbidities: Impaired vision in R>L eye, lumbar fusion, L complete rotator cuff tear and inability to flex arm  are also affecting patient's functional outcome.   REHAB POTENTIAL: Good  CLINICAL DECISION MAKING: Stable/uncomplicated  EVALUATION COMPLEXITY: Moderate  PLAN:  PT FREQUENCY: 2x/week  PT DURATION: 8 weeks  PLANNED INTERVENTIONS: Therapeutic exercises, Therapeutic activity, Neuromuscular re-education, Balance training, Gait training, Patient/Family education, Self Care, Joint mobilization, Stair training, Orthotic/Fit training, Aquatic Therapy, Cryotherapy, Moist heat, Manual therapy, and Re-evaluation  PLAN FOR NEXT SESSION:  Posture, fwd/backward walking, staying closer to walker frame, dynamic standing balance tasks  Westley Foots, PT Westley Foots, PT, DPT, CBIS  08/08/2023, 3:27 PM

## 2023-08-10 ENCOUNTER — Encounter (INDEPENDENT_AMBULATORY_CARE_PROVIDER_SITE_OTHER): Payer: Medicare Other | Admitting: Ophthalmology

## 2023-08-10 DIAGNOSIS — H35033 Hypertensive retinopathy, bilateral: Secondary | ICD-10-CM | POA: Diagnosis not present

## 2023-08-10 DIAGNOSIS — H43813 Vitreous degeneration, bilateral: Secondary | ICD-10-CM

## 2023-08-10 DIAGNOSIS — I10 Essential (primary) hypertension: Secondary | ICD-10-CM

## 2023-08-10 DIAGNOSIS — H353231 Exudative age-related macular degeneration, bilateral, with active choroidal neovascularization: Secondary | ICD-10-CM | POA: Diagnosis not present

## 2023-08-10 DIAGNOSIS — H3411 Central retinal artery occlusion, right eye: Secondary | ICD-10-CM

## 2023-08-11 ENCOUNTER — Ambulatory Visit: Payer: Medicare Other

## 2023-08-11 DIAGNOSIS — R262 Difficulty in walking, not elsewhere classified: Secondary | ICD-10-CM

## 2023-08-11 DIAGNOSIS — R296 Repeated falls: Secondary | ICD-10-CM

## 2023-08-11 DIAGNOSIS — R2681 Unsteadiness on feet: Secondary | ICD-10-CM | POA: Diagnosis not present

## 2023-08-11 DIAGNOSIS — R2689 Other abnormalities of gait and mobility: Secondary | ICD-10-CM

## 2023-08-11 DIAGNOSIS — M6281 Muscle weakness (generalized): Secondary | ICD-10-CM

## 2023-08-11 NOTE — Therapy (Signed)
OUTPATIENT PHYSICAL THERAPY NEURO TREATMENT   Patient Name: Jay Tran MRN: [id number] DOB:1934-12-06, 89 y.o., male Today's Date: 08/11/2023   PCP: Dr. Windle Guard REFERRING PROVIDER: Dr. Windle Guard  END OF SESSION:  PT End of Session - 08/11/23 1405     Visit Number 3    Number of Visits 13    Date for PT Re-Evaluation 09/22/23    Authorization Type Medicare    Progress Note Due on Visit 10    PT Start Time 1402    PT Stop Time 1443    PT Time Calculation (min) 41 min    Equipment Utilized During Treatment Gait belt    Activity Tolerance Patient tolerated treatment well    Behavior During Therapy WFL for tasks assessed/performed             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 (HCC)    Hx: of after 2013 back surgery   GERD (gastroesophageal reflux disease)    Hearing aid worn    Hx: of right ear   Hyperlipemia    Hypertension    Lumbar herniated disc    Lumbar stenosis    Macular degeneration right eye dry   PONV (postoperative nausea and vomiting)    takes iv nausea meds    Seasonal allergies    Hx: of   Stroke [facility])    Past Surgical History:  Procedure Laterality Date   BACK SURGERY  2013, x 3 with dr Yetta Barre 2014, 2016 and 2019   x 4, takes back injections monthly   COLONOSCOPY     Hx: of   ENDARTERECTOMY Right 02/07/2020   Procedure: ENDARTERECTOMY CAROTID;  Surgeon: Nada Libman, MD;  Location: [facility] OR;  Service: Vascular;  Laterality: Right;   EYE SURGERY     bil catarcts 02/2016   HERNIA REPAIR  several yrs ago   inguinal   INCISION AND DRAINAGE ABSCESS N/A 04/02/2023   Procedure: INCISION AND DRAINAGE,  LEFT GLUTEAL ABSCESS;  Surgeon: Sheliah Hatch De Blanch, MD;  Location: [facility] OR;  Service: General;  Laterality: N/A;   LUMBAR LAMINECTOMY  10/26/2012   Procedure: MICRODISCECTOMY LUMBAR  LAMINECTOMY;  Surgeon: Eldred Manges, MD;  Location: [facility] OR;  Service: Orthopedics;  Laterality: Right;  Right L4-5 Microdiscectomy   LUMBAR WOUND DEBRIDEMENT N/A 09/15/2021   Procedure: revision of thoracic incision, Irrigation and debridement of battery pocket wound;  Surgeon: Tia Alert, MD;  Location: [facility] OR;  Service: Neurosurgery;  Laterality: N/A;   MAXIMUM ACCESS (MAS)POSTERIOR LUMBAR INTERBODY FUSION (PLIF) 1 LEVEL N/A 04/15/2015   Procedure: Maximum Access Surgery Posterior Lumbar Interbody Fusion Lumbar two-Lumbar three extension of hardware;  Surgeon: Tia Alert, MD;  Location: [facility] NEURO ORS;  Service: Neurosurgery;  Laterality: N/A;   PATCH ANGIOPLASTY Right 02/07/2020   Procedure: PATCH ANGIOPLASTY USING Livia Snellen BIOLOGIC PATCH;  Surgeon: Nada Libman, MD;  Location: [facility] OR;  Service: Vascular;  Laterality: Right;   ROTATOR CUFF REPAIR     right shoulder - 3 times   SPINAL CORD STIMULATOR INSERTION  08/11/2021   TONSILLECTOMY  as child   TRANSURETHRAL RESECTION OF BLADDER TUMOR N/A 02/24/2017   Procedure: TRANSURETHRAL RESECTION OF BLADDER TUMOR (TURBT);  Surgeon: Sebastian Ache, MD;  Location: [facility] ORS;  Service: Urology;  Laterality: N/A;   TRANSURETHRAL  RESECTION OF PROSTATE N/A 11/20/2020   Procedure: TRANSURETHRAL RESECTION OF THE PROSTATE (TURP);  Surgeon: Sebastian Ache, MD;  Location: [facility];  Service: Urology;  Laterality: N/A;   Patient Active Problem List   Diagnosis Date Noted   Perianal abscess 04/02/2023   Hyponatremia 04/02/2023   Delirium 04/01/2023   Weakness 04/01/2023   Acute cystitis without hematuria 02/23/2023   Syncope and collapse 02/22/2023   AKI (acute kidney injury) (HCC) 02/22/2023   Unilateral primary osteoarthritis, right hip 03/30/2022   Prostatic hypertrophy 11/20/2020   Internal carotid artery stenosis 02/05/2020   Essential hypertension 02/05/2020   Benign prostatic hyperplasia with urinary retention 02/05/2020    GERD (gastroesophageal reflux disease) 02/05/2020   Chronic back pain 02/05/2020   Vision loss of right eye 02/04/2020   Wound drainage 08/20/2018   Chronic pain of both shoulders 02/22/2017   S/P lumbar spinal fusion 04/15/2015   HNP (herniated nucleus pulposus), lumbar 10/26/2012    Class: Diagnosis of    ONSET DATE: 03/31/23  REFERRING DIAG: R26.89 (ICD-10-CM) - Balance disorder   THERAPY DIAG:  Unsteadiness on feet  Repeated falls  Muscle weakness (generalized)  Difficulty in walking, not elsewhere classified  Other abnormalities of gait and mobility  Rationale for Evaluation and Treatment: Rehabilitation  SUBJECTIVE:                                                                                                                                                                                             SUBJECTIVE STATEMENT: Patient arrives to clinic with wife who stays in lobby. He's still remaining too far behind the walker when ambulating. States his vision is improving because "the blood is drying up" in his eye. Denies falls. Patient reports not being able to stand up from recliner last night, so wife gave him a potassium pill and that enabled him to stand up. PT educating patient on taking all rx as prescribed.   PERTINENT HISTORY: Patient with a history of BPH w/ urinary retention, chronic pain s/p lumbar fusion w/ spinal cord stimulator (~2022), HTN, CAD, DVT, HLD, and CVA (~)2021  PAIN:  Are you having pain? No  PRECAUTIONS: Fall  PATIENT GOALS: improve walking   TODAY'S TREATMENT:  GAIT: -124ft RW + yellow band for visual cue  -very resistant to any education regarding proper use of AD -140ft RW + CGA and 4# ankle weight to L ankle  -decr eversion noted, able to remain within wlaker frame -~64ft pacing for blocked practice turn and sit  pattern while remaining within walker frame  -fwd walking over 4" hurdles in // bars x2 laps with CGA and max cues for foot clearance (4# ankle weight L LE)  -lateral stepping over 4" hurdles in // bars x2 laps with CGA and max cues for foot clearance (4# ankle weight L LE)  -final 113ft RW + no ankle weight, able to maintain longer step with minimal cuing to do so   PATIENT EDUCATION: Education details: HEP, use of RW not rollator, staying within frame of walker Person educated: Patient Education method: Explanation Education comprehension: verbalized understanding and needs further education  HOME EXERCISE PROGRAM: Access Code: [email or web address] URL: [email or web address] Date: 08/08/2023 Prepared by: Merry Lofty  Exercises - Supine March  - 1 x daily - 7 x weekly - 3 sets - 10 reps - Sit to Stand  - 1 x daily - 7 x weekly - 2 sets - 5 reps - Seated March  - 1 x daily - 7 x weekly - 3 sets - 10 reps - Standing Tandem Balance with Counter Support  - 1 x daily - 7 x weekly - 3 sets - 3 reps - 30s hold - Narrow Stance with Unilateral Counter Support  - 1 x daily - 7 x weekly - 3 sets - 3 reps - 30s hold - Standing March with Counter Support  - 1 x daily - 7 x weekly - 3 sets - 10 reps - Backwards Walking  - 1 x daily - 7 x weekly - 3 sets - 10 reps  GOALS: Goals reviewed with patient? Yes  SHORT TERM GOALS: Target date: 08/25/2023   Patient will be able to perform standing forward reach to 5" to improve static balance. Baseline: can safey reach 2" (07/28/23) Goal status: INITIAL  2.  Pt will be I and compliant with HEP to self manage symtpoms Baseline: TBD Goal status: INITIAL  3.   LONG TERM GOALS: Target date: 09/22/2023   Patient will demo BBS score of >48/56 to improve static balance and reduce fall risk  Baseline: 41/56 (07/28/23) Goal status: INITIAL  2.  Pt will demo gait speed of >0.55 m/s with RW to improve functional gait and reduce fall risk. Baseline: 0.47  m/s Goal status: INITIAL  3.  Pt will demo TUG of <20 sec with RW to improve functional mobility and reduce fall risk  Baseline: 29 sec with RW Goal status: INITIAL  4.  Pt will be I and compliant with HEP to self manage symptoms. Baseline: TBD Goal status: INITIAL  ASSESSMENT: CLINICAL IMPRESSION: Patient seen for skilled PT session with emphasis on gait retraining. He has tendency toward L foot eversion and pushing the walker too far in front of himself resulting in forward flexed posture. Patient resistant to education, stating that his current use of the walker is a long standing habit and thus hard to break. Patient tolerating addition of ankle weight well in order to increase proprioceptive input. Modest carryover noted to gait with removal of weight. Continue POC.   OBJECTIVE IMPAIRMENTS: Abnormal gait, decreased balance, decreased coordination, decreased mobility, difficulty walking, decreased safety awareness, hypomobility, increased muscle spasms, impaired flexibility, impaired UE functional use, impaired vision/preception, postural dysfunction,  and pain.   ACTIVITY LIMITATIONS: carrying, lifting, standing, squatting, stairs, transfers, bathing, reach over head, and hygiene/grooming  PARTICIPATION LIMITATIONS: meal prep, cleaning, laundry, driving, community activity, and yard work  PERSONAL FACTORS: Age, Time since onset of injury/illness/exacerbation, and 3+ comorbidities: Impaired vision in R>L eye, lumbar fusion, L complete rotator cuff tear and inability to flex arm  are also affecting patient's functional outcome.   REHAB POTENTIAL: Good  CLINICAL DECISION MAKING: Stable/uncomplicated  EVALUATION COMPLEXITY: Moderate  PLAN:  PT FREQUENCY: 2x/week  PT DURATION: 8 weeks  PLANNED INTERVENTIONS: Therapeutic exercises, Therapeutic activity, Neuromuscular re-education, Balance training, Gait training, Patient/Family education, Self Care, Joint mobilization, Stair  training, Orthotic/Fit training, Aquatic Therapy, Cryotherapy, Moist heat, Manual therapy, and Re-evaluation  PLAN FOR NEXT SESSION:  Posture, fwd/backward walking, staying closer to walker frame, dynamic standing balance tasks    Westley Foots, PT Westley Foots, PT, DPT, CBIS  08/11/2023, 3:07 PM

## 2023-08-14 ENCOUNTER — Ambulatory Visit: Payer: Medicare Other

## 2023-08-14 DIAGNOSIS — R2681 Unsteadiness on feet: Secondary | ICD-10-CM

## 2023-08-14 DIAGNOSIS — M6281 Muscle weakness (generalized): Secondary | ICD-10-CM

## 2023-08-14 DIAGNOSIS — R262 Difficulty in walking, not elsewhere classified: Secondary | ICD-10-CM

## 2023-08-14 DIAGNOSIS — R2689 Other abnormalities of gait and mobility: Secondary | ICD-10-CM

## 2023-08-14 NOTE — Therapy (Addendum)
OUTPATIENT PHYSICAL THERAPY NEURO TREATMENT   Patient Name: Jay Tran MRN: [id number] DOB:Dec 17, 1934, 89 y.o., male Today's Date: 08/14/2023   PCP: Dr. Windle Guard REFERRING PROVIDER: Dr. Windle Guard  END OF SESSION:  PT End of Session - 08/14/23 1448     Visit Number 4    Number of Visits 13    Date for PT Re-Evaluation 09/22/23    Authorization Type Medicare    Progress Note Due on Visit 10    PT Start Time 1355    PT Stop Time 1442    PT Time Calculation (min) 47 min    Equipment Utilized During Treatment Gait belt    Activity Tolerance Patient tolerated treatment well    Behavior During Therapy WFL for tasks assessed/performed             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 (HCC)    Hx: of after 2013 back surgery   GERD (gastroesophageal reflux disease)    Hearing aid worn    Hx: of right ear   Hyperlipemia    Hypertension    Lumbar herniated disc    Lumbar stenosis    Macular degeneration right eye dry   PONV (postoperative nausea and vomiting)    takes iv nausea meds    Seasonal allergies    Hx: of   Stroke [facility])    Past Surgical History:  Procedure Laterality Date   BACK SURGERY  2013, x 3 with dr Yetta Barre 2014, 2016 and 2019   x 4, takes back injections monthly   COLONOSCOPY     Hx: of   ENDARTERECTOMY Right 02/07/2020   Procedure: ENDARTERECTOMY CAROTID;  Surgeon: Nada Libman, MD;  Location: [facility] OR;  Service: Vascular;  Laterality: Right;   EYE SURGERY     bil catarcts 02/2016   HERNIA REPAIR  several yrs ago   inguinal   INCISION AND DRAINAGE ABSCESS N/A 04/02/2023   Procedure: INCISION AND DRAINAGE,  LEFT GLUTEAL ABSCESS;  Surgeon: Sheliah Hatch De Blanch, MD;  Location: [facility] OR;  Service: General;  Laterality: N/A;   LUMBAR LAMINECTOMY  10/26/2012   Procedure: MICRODISCECTOMY LUMBAR  LAMINECTOMY;  Surgeon: Eldred Manges, MD;  Location: [facility] OR;  Service: Orthopedics;  Laterality: Right;  Right L4-5 Microdiscectomy   LUMBAR WOUND DEBRIDEMENT N/A 09/15/2021   Procedure: revision of thoracic incision, Irrigation and debridement of battery pocket wound;  Surgeon: Tia Alert, MD;  Location: [facility] OR;  Service: Neurosurgery;  Laterality: N/A;   MAXIMUM ACCESS (MAS)POSTERIOR LUMBAR INTERBODY FUSION (PLIF) 1 LEVEL N/A 04/15/2015   Procedure: Maximum Access Surgery Posterior Lumbar Interbody Fusion Lumbar two-Lumbar three extension of hardware;  Surgeon: Tia Alert, MD;  Location: [facility] NEURO ORS;  Service: Neurosurgery;  Laterality: N/A;   PATCH ANGIOPLASTY Right 02/07/2020   Procedure: PATCH ANGIOPLASTY USING Livia Snellen BIOLOGIC PATCH;  Surgeon: Nada Libman, MD;  Location: [facility] OR;  Service: Vascular;  Laterality: Right;   ROTATOR CUFF REPAIR     right shoulder - 3 times   SPINAL CORD STIMULATOR INSERTION  08/11/2021   TONSILLECTOMY  as child   TRANSURETHRAL RESECTION OF BLADDER TUMOR N/A 02/24/2017   Procedure: TRANSURETHRAL RESECTION OF BLADDER TUMOR (TURBT);  Surgeon: Sebastian Ache, MD;  Location: [facility] ORS;  Service: Urology;  Laterality: N/A;   TRANSURETHRAL  RESECTION OF PROSTATE N/A 11/20/2020   Procedure: TRANSURETHRAL RESECTION OF THE PROSTATE (TURP);  Surgeon: Sebastian Ache, MD;  Location: [facility];  Service: Urology;  Laterality: N/A;   Patient Active Problem List   Diagnosis Date Noted   Perianal abscess 04/02/2023   Hyponatremia 04/02/2023   Delirium 04/01/2023   Weakness 04/01/2023   Acute cystitis without hematuria 02/23/2023   Syncope and collapse 02/22/2023   AKI (acute kidney injury) (HCC) 02/22/2023   Unilateral primary osteoarthritis, right hip 03/30/2022   Prostatic hypertrophy 11/20/2020   Internal carotid artery stenosis 02/05/2020   Essential hypertension 02/05/2020   Benign prostatic hyperplasia with urinary retention 02/05/2020    GERD (gastroesophageal reflux disease) 02/05/2020   Chronic back pain 02/05/2020   Vision loss of right eye 02/04/2020   Wound drainage 08/20/2018   Chronic pain of both shoulders 02/22/2017   S/P lumbar spinal fusion 04/15/2015   HNP (herniated nucleus pulposus), lumbar 10/26/2012    Class: Diagnosis of    ONSET DATE: 03/31/23  REFERRING DIAG: R26.89 (ICD-10-CM) - Balance disorder   THERAPY DIAG:  Unsteadiness on feet  Muscle weakness (generalized)  Difficulty in walking, not elsewhere classified  Other abnormalities of gait and mobility  Rationale for Evaluation and Treatment: Rehabilitation  SUBJECTIVE:                                                                                                                                                                                             SUBJECTIVE STATEMENT: Patient arrives to clinic with wife who stays in lobby. He reports doing well. Denies falls.   PERTINENT HISTORY: Patient with a history of BPH w/ urinary retention, chronic pain s/p lumbar fusion w/ spinal cord stimulator (~2022), HTN, CAD, DVT, HLD, and CVA (~)2021  PAIN:  Are you having pain? No  PRECAUTIONS: Fall  PATIENT GOALS: improve walking   TODAY'S TREATMENT:  GAIT: -164ft trial with SBQC to minimize instances of L foot kicking walker   -decreased step length   -MinA required with multiple standing rest breaks  -157ft with rollator  -increased trunk flexion  -decreased L foot kicking walker  -step over vinyl dots with L foot to encourage L foot clearance and step length   -difficulty coordination/motor planning larger steps   -drifting to the L in the walker   -mirror added for visual feedback   -added 2nd column of dots to encourage reciprocal large steps, instead of step-to  -157ftx2 RW + CGA with max tactile/verbal cuing  for larger steps   PATIENT EDUCATION: Education details: HEP, use of RW not rollator, staying within frame of walker Person educated: Patient Education method: Explanation Education comprehension: verbalized understanding and needs further education  HOME EXERCISE PROGRAM: Access Code: [email or web address] URL: [email or web address] Date: 08/08/2023 Prepared by: Merry Lofty  Exercises - Supine March  - 1 x daily - 7 x weekly - 3 sets - 10 reps - Sit to Stand  - 1 x daily - 7 x weekly - 2 sets - 5 reps - Seated March  - 1 x daily - 7 x weekly - 3 sets - 10 reps - Standing Tandem Balance with Counter Support  - 1 x daily - 7 x weekly - 3 sets - 3 reps - 30s hold - Narrow Stance with Unilateral Counter Support  - 1 x daily - 7 x weekly - 3 sets - 3 reps - 30s hold - Standing March with Counter Support  - 1 x daily - 7 x weekly - 3 sets - 10 reps - Backwards Walking  - 1 x daily - 7 x weekly - 3 sets - 10 reps  GOALS: Goals reviewed with patient? Yes  SHORT TERM GOALS: Target date: 08/25/2023   Patient will be able to perform standing forward reach to 5" to improve static balance. Baseline: can safey reach 2" (07/28/23) Goal status: INITIAL  2.  Pt will be I and compliant with HEP to self manage symtpoms Baseline: TBD Goal status: INITIAL  3.   LONG TERM GOALS: Target date: 09/22/2023   Patient will demo BBS score of >48/56 to improve static balance and reduce fall risk  Baseline: 41/56 (07/28/23) Goal status: INITIAL  2.  Pt will demo gait speed of >0.55 m/s with RW to improve functional gait and reduce fall risk. Baseline: 0.47 m/s Goal status: INITIAL  3.  Pt will demo TUG of <20 sec with RW to improve functional mobility and reduce fall risk  Baseline: 29 sec with RW Goal status: INITIAL  4.  Pt will be I and compliant with HEP to self manage symptoms. Baseline: TBD Goal status: INITIAL  ASSESSMENT: CLINICAL IMPRESSION: Patient seen for skilled PT session with  emphasis on gait retraining. Patient continues to kick L leg of walker and is noticeably drifting to the L side of the walker- question L UE strength/ability to coordinate Bimanual AD? Patient reports no awareness of this. Despite multiple interventions to encourage longer L step length and increased time weightbearing on R LE, patient resumes step- to gait pattern once intervention is withdrawn. He is not safe to ambulate with any AD other than a RW and even the RW has its challenges. Continue POC.   OBJECTIVE IMPAIRMENTS: Abnormal gait, decreased balance, decreased coordination, decreased mobility, difficulty walking, decreased safety awareness, hypomobility, increased muscle spasms, impaired flexibility, impaired UE functional use, impaired vision/preception, postural dysfunction, and pain.  ACTIVITY LIMITATIONS: carrying, lifting, standing, squatting, stairs, transfers, bathing, reach over head, and hygiene/grooming  PARTICIPATION LIMITATIONS: meal prep, cleaning, laundry, driving, community activity, and yard work  PERSONAL FACTORS: Age, Time since onset of injury/illness/exacerbation, and 3+ comorbidities: Impaired vision in R>L eye, lumbar fusion, L complete rotator cuff tear and inability to flex arm  are also affecting patient's functional outcome.   REHAB POTENTIAL: Good  CLINICAL DECISION MAKING: Stable/uncomplicated  EVALUATION COMPLEXITY: Moderate  PLAN:  PT FREQUENCY: 2x/week  PT DURATION: 8 weeks  PLANNED INTERVENTIONS: Therapeutic exercises, Therapeutic activity, Neuromuscular re-education, Balance training, Gait training, Patient/Family education, Self Care, Joint mobilization, Stair training, Orthotic/Fit training, Aquatic Therapy, Cryotherapy, Moist heat, Manual therapy, and Re-evaluation  PLAN FOR NEXT SESSION:  Posture, fwd/backward walking, staying closer to walker frame, dynamic standing balance tasks, R LE SLS tasks?   Westley Foots, PT Westley Foots, PT,  DPT, CBIS  08/14/2023, 3:04 PM

## 2023-08-18 ENCOUNTER — Ambulatory Visit: Payer: Medicare Other

## 2023-08-18 DIAGNOSIS — R2681 Unsteadiness on feet: Secondary | ICD-10-CM | POA: Diagnosis not present

## 2023-08-18 DIAGNOSIS — M6281 Muscle weakness (generalized): Secondary | ICD-10-CM

## 2023-08-18 DIAGNOSIS — R262 Difficulty in walking, not elsewhere classified: Secondary | ICD-10-CM

## 2023-08-18 DIAGNOSIS — R2689 Other abnormalities of gait and mobility: Secondary | ICD-10-CM

## 2023-08-18 NOTE — Therapy (Signed)
OUTPATIENT PHYSICAL THERAPY NEURO TREATMENT   Patient Name: Jay Tran MRN: [id number] DOB:[birth year], 89 y.o., male Today's Date: 08/18/2023   PCP: Dr. Windle Guard REFERRING PROVIDER: Dr. Windle Guard  END OF SESSION:  PT End of Session - 08/18/23 1350     Visit Number 5    Number of Visits 13    Date for PT Re-Evaluation 09/22/23    Authorization Type Medicare    Progress Note Due on Visit 10    PT Start Time 1356    PT Stop Time 1440    PT Time Calculation (min) 44 min    Equipment Utilized During Treatment Gait belt    Activity Tolerance Patient tolerated treatment well    Behavior During Therapy WFL for tasks assessed/performed             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 (HCC)    Hx: of after 2013 back surgery   GERD (gastroesophageal reflux disease)    Hearing aid worn    Hx: of right ear   Hyperlipemia    Hypertension    Lumbar herniated disc    Lumbar stenosis    Macular degeneration right eye dry   PONV (postoperative nausea and vomiting)    takes iv nausea meds    Seasonal allergies    Hx: of   Stroke [facility])    Past Surgical History:  Procedure Laterality Date   BACK SURGERY  2013, x 3 with dr Yetta Barre 2014, 2016 and 2019   x 4, takes back injections monthly   COLONOSCOPY     Hx: of   ENDARTERECTOMY Right 02/07/2020   Procedure: ENDARTERECTOMY CAROTID;  Surgeon: Nada Libman, MD;  Location: [facility] OR;  Service: Vascular;  Laterality: Right;   EYE SURGERY     bil catarcts 02/2016   HERNIA REPAIR  several yrs ago   inguinal   INCISION AND DRAINAGE ABSCESS N/A 04/02/2023   Procedure: INCISION AND DRAINAGE,  LEFT GLUTEAL ABSCESS;  Surgeon: Sheliah Hatch De Blanch, MD;  Location: [facility] OR;  Service: General;  Laterality: N/A;   LUMBAR LAMINECTOMY  10/26/2012   Procedure: MICRODISCECTOMY LUMBAR  LAMINECTOMY;  Surgeon: Eldred Manges, MD;  Location: [facility] OR;  Service: Orthopedics;  Laterality: Right;  Right L4-5 Microdiscectomy   LUMBAR WOUND DEBRIDEMENT N/A 09/15/2021   Procedure: revision of thoracic incision, Irrigation and debridement of battery pocket wound;  Surgeon: Tia Alert, MD;  Location: [facility] OR;  Service: Neurosurgery;  Laterality: N/A;   MAXIMUM ACCESS (MAS)POSTERIOR LUMBAR INTERBODY FUSION (PLIF) 1 LEVEL N/A 04/15/2015   Procedure: Maximum Access Surgery Posterior Lumbar Interbody Fusion Lumbar two-Lumbar three extension of hardware;  Surgeon: Tia Alert, MD;  Location: [facility] NEURO ORS;  Service: Neurosurgery;  Laterality: N/A;   PATCH ANGIOPLASTY Right 02/07/2020   Procedure: PATCH ANGIOPLASTY USING Livia Snellen BIOLOGIC PATCH;  Surgeon: Nada Libman, MD;  Location: [facility] OR;  Service: Vascular;  Laterality: Right;   ROTATOR CUFF REPAIR     right shoulder - 3 times   SPINAL CORD STIMULATOR INSERTION  08/11/2021   TONSILLECTOMY  as child   TRANSURETHRAL RESECTION OF BLADDER TUMOR N/A 02/24/2017   Procedure: TRANSURETHRAL RESECTION OF BLADDER TUMOR (TURBT);  Surgeon: Sebastian Ache, MD;  Location: [facility] ORS;  Service: Urology;  Laterality: N/A;   TRANSURETHRAL  RESECTION OF PROSTATE N/A 11/20/2020   Procedure: TRANSURETHRAL RESECTION OF THE PROSTATE (TURP);  Surgeon: Sebastian Ache, MD;  Location: [facility]  Service: Urology;  Laterality: N/A;   Patient Active Problem List   Diagnosis Date Noted   Perianal abscess 04/02/2023   Hyponatremia 04/02/2023   Delirium 04/01/2023   Weakness 04/01/2023   Acute cystitis without hematuria 02/23/2023   Syncope and collapse 02/22/2023   AKI (acute kidney injury) (HCC) 02/22/2023   Unilateral primary osteoarthritis, right hip 03/30/2022   Prostatic hypertrophy 11/20/2020   Internal carotid artery stenosis 02/05/2020   Essential hypertension 02/05/2020   Benign prostatic hyperplasia with urinary retention 02/05/2020    GERD (gastroesophageal reflux disease) 02/05/2020   Chronic back pain 02/05/2020   Vision loss of right eye 02/04/2020   Wound drainage 08/20/2018   Chronic pain of both shoulders 02/22/2017   S/P lumbar spinal fusion 04/15/2015   HNP (herniated nucleus pulposus), lumbar 10/26/2012    Class: Diagnosis of    ONSET DATE: 03/31/23  REFERRING DIAG: R26.89 (ICD-10-CM) - Balance disorder   THERAPY DIAG:  Unsteadiness on feet  Difficulty in walking, not elsewhere classified  Other abnormalities of gait and mobility  Muscle weakness (generalized)  Rationale for Evaluation and Treatment: Rehabilitation  SUBJECTIVE:                                                                                                                                                                                             SUBJECTIVE STATEMENT: Patient reports doing well. Says he has been practicing using RW at home and keeping his feet inside the frame. Denies falls.   PERTINENT HISTORY: Patient with a history of BPH w/ urinary retention, chronic pain s/p lumbar fusion w/ spinal cord stimulator (~2022), HTN, CAD, DVT, HLD, and CVA (~)2021  PAIN:  Are you having pain? No  PRECAUTIONS: Fall  PATIENT GOALS: improve walking   TODAY'S TREATMENT:  GAIT: -cones set up to simulate narrow doorways x4 in a square- tight turns with RW  -weaving through cones with RW + new tennis balls for ease of turns   NMR:  -standing toe-taps to 4" box progressed to 4# ankle weights   PATIENT EDUCATION: Education details: continue HEP Person educated: Patient Education method: Explanation Education comprehension: verbalized understanding and needs further education  HOME EXERCISE PROGRAM: Access Code: [email or web address] URL: [email or web address] Date: 08/08/2023 Prepared by: Merry Lofty  Exercises - Supine March  - 1 x daily - 7 x weekly - 3 sets - 10 reps - Sit to Stand  - 1 x daily - 7 x weekly - 2 sets - 5 reps - Seated March  - 1 x daily - 7 x weekly - 3 sets - 10 reps - Standing Tandem Balance with Counter Support  - 1 x daily - 7 x weekly - 3 sets - 3 reps - 30s hold - Narrow Stance with Unilateral Counter Support  - 1 x daily - 7 x weekly - 3 sets - 3 reps - 30s hold - Standing March with Counter Support  - 1 x daily - 7 x weekly - 3 sets - 10 reps - Backwards Walking  - 1 x daily - 7 x weekly - 3 sets - 10 reps  GOALS: Goals reviewed with patient? Yes  SHORT TERM GOALS: Target date: 08/25/2023   Patient will be able to perform standing forward reach to 5" to improve static balance. Baseline: can safey reach 2" (07/28/23) Goal status: INITIAL  2.  Pt will be I and compliant with HEP to self manage symtpoms Baseline: TBD Goal status: INITIAL  3.   LONG TERM GOALS: Target date: 09/22/2023   Patient will demo BBS score of >48/56 to improve static balance and reduce fall risk  Baseline: 41/56 (07/28/23) Goal status: INITIAL  2.  Pt will demo gait speed of >0.55 m/s with RW to improve functional gait and reduce fall risk. Baseline: 0.47 m/s Goal status: INITIAL  3.  Pt will demo TUG of <20 sec with RW to improve functional mobility and reduce fall risk  Baseline: 29 sec with RW Goal status: INITIAL  4.  Pt will be I and compliant with HEP to self manage symptoms. Baseline: TBD Goal status: INITIAL  ASSESSMENT: CLINICAL IMPRESSION: Patient seen for skilled PT session with emphasis on gait retraining with RW and LE NMR. Patient able to negotiate tight turns much better with new tennis balls on posterior legs of RW. Still with L foot eversion and intermittent kicking of posterior leg of walker. Patient continues to report that this is his baseline and not likely to change. Continue POC.   OBJECTIVE IMPAIRMENTS: Abnormal gait, decreased balance,  decreased coordination, decreased mobility, difficulty walking, decreased safety awareness, hypomobility, increased muscle spasms, impaired flexibility, impaired UE functional use, impaired vision/preception, postural dysfunction, and pain.   ACTIVITY LIMITATIONS: carrying, lifting, standing, squatting, stairs, transfers, bathing, reach over head, and hygiene/grooming  PARTICIPATION LIMITATIONS: meal prep, cleaning, laundry, driving, community activity, and yard work  PERSONAL FACTORS: Age, Time since onset of injury/illness/exacerbation, and 3+ comorbidities: Impaired vision in R>L eye, lumbar fusion, L complete rotator cuff tear and inability to flex arm  are also affecting patient's functional outcome.   REHAB POTENTIAL: Good  CLINICAL DECISION MAKING: Stable/uncomplicated  EVALUATION COMPLEXITY: Moderate  PLAN:  PT FREQUENCY: 2x/week  PT DURATION: 8 weeks  PLANNED INTERVENTIONS: Therapeutic exercises, Therapeutic activity, Neuromuscular re-education, Balance training, Gait  training, Patient/Family education, Self Care, Joint mobilization, Stair training, Orthotic/Fit training, Aquatic Therapy, Cryotherapy, Moist heat, Manual therapy, and Re-evaluation  PLAN FOR NEXT SESSION:  Posture, fwd/backward walking, staying closer to walker frame, dynamic standing balance tasks, R LE SLS tasks?   Westley Foots, PT Westley Foots, PT, DPT, CBIS  08/18/2023, 2:44 PM

## 2023-08-22 ENCOUNTER — Ambulatory Visit: Payer: Medicare Other | Attending: Family Medicine

## 2023-08-22 DIAGNOSIS — M6281 Muscle weakness (generalized): Secondary | ICD-10-CM | POA: Insufficient documentation

## 2023-08-22 DIAGNOSIS — R2689 Other abnormalities of gait and mobility: Secondary | ICD-10-CM | POA: Diagnosis present

## 2023-08-22 DIAGNOSIS — R296 Repeated falls: Secondary | ICD-10-CM | POA: Insufficient documentation

## 2023-08-22 DIAGNOSIS — R2681 Unsteadiness on feet: Secondary | ICD-10-CM | POA: Diagnosis present

## 2023-08-22 DIAGNOSIS — R262 Difficulty in walking, not elsewhere classified: Secondary | ICD-10-CM | POA: Insufficient documentation

## 2023-08-22 NOTE — Therapy (Signed)
OUTPATIENT PHYSICAL THERAPY NEURO TREATMENT   Patient Name: Jay Tran MRN: [id number] DOB:Sep 17, 1934, 89 y.o., male Today's Date: 08/22/2023   PCP: Dr. Windle Guard REFERRING PROVIDER: Dr. Windle Guard  END OF SESSION:  PT End of Session - 08/22/23 1345     Visit Number 6    Number of Visits 13    Date for PT Re-Evaluation 09/22/23    Authorization Type Medicare    Progress Note Due on Visit 10    PT Start Time 1400    PT Stop Time 1438    PT Time Calculation (min) 38 min    Equipment Utilized During Treatment Gait belt    Activity Tolerance Patient tolerated treatment well    Behavior During Therapy WFL for tasks assessed/performed             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 (HCC)    Hx: of after 2013 back surgery   GERD (gastroesophageal reflux disease)    Hearing aid worn    Hx: of right ear   Hyperlipemia    Hypertension    Lumbar herniated disc    Lumbar stenosis    Macular degeneration right eye dry   PONV (postoperative nausea and vomiting)    takes iv nausea meds    Seasonal allergies    Hx: of   Stroke [facility])    Past Surgical History:  Procedure Laterality Date   BACK SURGERY  2013, x 3 with dr Yetta Barre 2014, 2016 and 2019   x 4, takes back injections monthly   COLONOSCOPY     Hx: of   ENDARTERECTOMY Right 02/07/2020   Procedure: ENDARTERECTOMY CAROTID;  Surgeon: Nada Libman, MD;  Location: [facility] OR;  Service: Vascular;  Laterality: Right;   EYE SURGERY     bil catarcts 02/2016   HERNIA REPAIR  several yrs ago   inguinal   INCISION AND DRAINAGE ABSCESS N/A 04/02/2023   Procedure: INCISION AND DRAINAGE,  LEFT GLUTEAL ABSCESS;  Surgeon: Sheliah Hatch De Blanch, MD;  Location: [facility] OR;  Service: General;  Laterality: N/A;   LUMBAR LAMINECTOMY  10/26/2012   Procedure: MICRODISCECTOMY LUMBAR  LAMINECTOMY;  Surgeon: Eldred Manges, MD;  Location: [facility] OR;  Service: Orthopedics;  Laterality: Right;  Right L4-5 Microdiscectomy   LUMBAR WOUND DEBRIDEMENT N/A 09/15/2021   Procedure: revision of thoracic incision, Irrigation and debridement of battery pocket wound;  Surgeon: Tia Alert, MD;  Location: [facility] OR;  Service: Neurosurgery;  Laterality: N/A;   MAXIMUM ACCESS (MAS)POSTERIOR LUMBAR INTERBODY FUSION (PLIF) 1 LEVEL N/A 04/15/2015   Procedure: Maximum Access Surgery Posterior Lumbar Interbody Fusion Lumbar two-Lumbar three extension of hardware;  Surgeon: Tia Alert, MD;  Location: [facility] NEURO ORS;  Service: Neurosurgery;  Laterality: N/A;   PATCH ANGIOPLASTY Right 02/07/2020   Procedure: PATCH ANGIOPLASTY USING Livia Snellen BIOLOGIC PATCH;  Surgeon: Nada Libman, MD;  Location: [facility] OR;  Service: Vascular;  Laterality: Right;   ROTATOR CUFF REPAIR     right shoulder - 3 times   SPINAL CORD STIMULATOR INSERTION  08/11/2021   TONSILLECTOMY  as child   TRANSURETHRAL RESECTION OF BLADDER TUMOR N/A 02/24/2017   Procedure: TRANSURETHRAL RESECTION OF BLADDER TUMOR (TURBT);  Surgeon: Sebastian Ache, MD;  Location: [facility] ORS;  Service: Urology;  Laterality: N/A;   TRANSURETHRAL  RESECTION OF PROSTATE N/A 11/20/2020   Procedure: TRANSURETHRAL RESECTION OF THE PROSTATE (TURP);  Surgeon: Sebastian Ache, MD;  Location: [facility];  Service: Urology;  Laterality: N/A;   Patient Active Problem List   Diagnosis Date Noted   Perianal abscess 04/02/2023   Hyponatremia 04/02/2023   Delirium 04/01/2023   Weakness 04/01/2023   Acute cystitis without hematuria 02/23/2023   Syncope and collapse 02/22/2023   AKI (acute kidney injury) (HCC) 02/22/2023   Unilateral primary osteoarthritis, right hip 03/30/2022   Prostatic hypertrophy 11/20/2020   Internal carotid artery stenosis 02/05/2020   Essential hypertension 02/05/2020   Benign prostatic hyperplasia with urinary retention 02/05/2020    GERD (gastroesophageal reflux disease) 02/05/2020   Chronic back pain 02/05/2020   Vision loss of right eye 02/04/2020   Wound drainage 08/20/2018   Chronic pain of both shoulders 02/22/2017   S/P lumbar spinal fusion 04/15/2015   HNP (herniated nucleus pulposus), lumbar 10/26/2012    Class: Diagnosis of    ONSET DATE: 03/31/23  REFERRING DIAG: R26.89 (ICD-10-CM) - Balance disorder   THERAPY DIAG:  Unsteadiness on feet  Other abnormalities of gait and mobility  Difficulty in walking, not elsewhere classified  Muscle weakness (generalized)  Rationale for Evaluation and Treatment: Rehabilitation  SUBJECTIVE:                                                                                                                                                                                             SUBJECTIVE STATEMENT: Patient reports doing well. Says he has been practicing using RW at home and keeping his feet inside the frame. Denies falls.   PERTINENT HISTORY: Patient with a history of BPH w/ urinary retention, chronic pain s/p lumbar fusion w/ spinal cord stimulator (~2022), HTN, CAD, DVT, HLD, and CVA (~)2021  PAIN:  Are you having pain? No  PRECAUTIONS: Fall  PATIENT GOALS: improve walking   TODAY'S TREATMENT:  NMR:  -standing toe-taps to 6" box progressed to 4# ankle weights -modified SLS with 2kg ball trunk twists  -MinA provided -blaze pods in semi-circle random toe tapping   -3x1 min (unable to fully complete final cycle due to fatigue -hooklying bridges   PATIENT EDUCATION: Education details: continue HEP, not appropriate to use SPC at this time Person educated: Patient Education method: Explanation Education comprehension: verbalized understanding and needs further education  HOME EXERCISE PROGRAM: Access Code: [email or web address] URL:  [email or web address] Date: 08/08/2023 Prepared by: Merry Lofty  Exercises - Supine March  - 1 x daily - 7 x weekly - 3 sets - 10 reps - Sit to Stand  - 1 x daily - 7 x weekly - 2 sets - 5 reps - Seated March  - 1 x daily - 7 x weekly - 3 sets - 10 reps - Standing Tandem Balance with Counter Support  - 1 x daily - 7 x weekly - 3 sets - 3 reps - 30s hold - Narrow Stance with Unilateral Counter Support  - 1 x daily - 7 x weekly - 3 sets - 3 reps - 30s hold - Standing March with Counter Support  - 1 x daily - 7 x weekly - 3 sets - 10 reps - Backwards Walking  - 1 x daily - 7 x weekly - 3 sets - 10 reps  GOALS: Goals reviewed with patient? Yes  SHORT TERM GOALS: Target date: 08/25/2023   Patient will be able to perform standing forward reach to 5" to improve static balance. Baseline: can safey reach 2" (07/28/23) Goal status: INITIAL  2.  Pt will be I and compliant with HEP to self manage symtpoms Baseline: TBD Goal status: INITIAL  3.   LONG TERM GOALS: Target date: 09/22/2023   Patient will demo BBS score of >48/56 to improve static balance and reduce fall risk  Baseline: 41/56 (07/28/23) Goal status: INITIAL  2.  Pt will demo gait speed of >0.55 m/s with RW to improve functional gait and reduce fall risk. Baseline: 0.47 m/s Goal status: INITIAL  3.  Pt will demo TUG of <20 sec with RW to improve functional mobility and reduce fall risk  Baseline: 29 sec with RW Goal status: INITIAL  4.  Pt will be I and compliant with HEP to self manage symptoms. Baseline: TBD Goal status: INITIAL  ASSESSMENT: CLINICAL IMPRESSION: Patient seen for skilled PT session with emphasis on gross NMR. He requires encouragement to continue with complex balance challenges. Limited by endurance of B LE and general conditioning. Poor righting reactions noted with tendency to simply grab AD with any LOB. Discussed that patient is not safe to use a SPC at this time due to the above noted.  Continue POC.    OBJECTIVE IMPAIRMENTS: Abnormal gait, decreased balance, decreased coordination, decreased mobility, difficulty walking, decreased safety awareness, hypomobility, increased muscle spasms, impaired flexibility, impaired UE functional use, impaired vision/preception, postural dysfunction, and pain.   ACTIVITY LIMITATIONS: carrying, lifting, standing, squatting, stairs, transfers, bathing, reach over head, and hygiene/grooming  PARTICIPATION LIMITATIONS: meal prep, cleaning, laundry, driving, community activity, and yard work  PERSONAL FACTORS: Age, Time since onset of injury/illness/exacerbation, and 3+ comorbidities: Impaired vision in R>L eye, lumbar fusion, L complete rotator cuff tear and inability to flex arm  are also affecting patient's functional outcome.   REHAB POTENTIAL: Good  CLINICAL DECISION MAKING: Stable/uncomplicated  EVALUATION COMPLEXITY: Moderate  PLAN:  PT FREQUENCY: 2x/week  PT DURATION: 8 weeks  PLANNED INTERVENTIONS:  Therapeutic exercises, Therapeutic activity, Neuromuscular re-education, Balance training, Gait training, Patient/Family education, Self Care, Joint mobilization, Stair training, Orthotic/Fit training, Aquatic Therapy, Cryotherapy, Moist heat, Manual therapy, and Re-evaluation  PLAN FOR NEXT SESSION:  Posture, fwd/backward walking, staying closer to walker frame, dynamic standing balance tasks, R LE SLS tasks?   Westley Foots, PT Westley Foots, PT, DPT, CBIS  08/22/2023, 2:40 PM

## 2023-08-25 ENCOUNTER — Ambulatory Visit: Payer: Medicare Other

## 2023-08-25 VITALS — BP 153/69

## 2023-08-25 DIAGNOSIS — R262 Difficulty in walking, not elsewhere classified: Secondary | ICD-10-CM

## 2023-08-25 DIAGNOSIS — M6281 Muscle weakness (generalized): Secondary | ICD-10-CM

## 2023-08-25 DIAGNOSIS — R2689 Other abnormalities of gait and mobility: Secondary | ICD-10-CM

## 2023-08-25 DIAGNOSIS — R2681 Unsteadiness on feet: Secondary | ICD-10-CM

## 2023-08-25 NOTE — Therapy (Signed)
OUTPATIENT PHYSICAL THERAPY NEURO TREATMENT   Patient Name: Jay Tran MRN: [id number] DOB:06/13/1934, 89 y.o., male Today's Date: 08/25/2023   PCP: Dr. Windle Guard REFERRING PROVIDER: Dr. Windle Guard  END OF SESSION:  PT End of Session - 08/25/23 1449     Visit Number 7    Number of Visits 13    Date for PT Re-Evaluation 09/22/23    Authorization Type Medicare    Progress Note Due on Visit 10    PT Start Time 1445    PT Stop Time 1523    PT Time Calculation (min) 38 min    Equipment Utilized During Treatment Gait belt    Activity Tolerance Patient tolerated treatment well    Behavior During Therapy WFL for tasks assessed/performed             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 (HCC)    Hx: of after 2013 back surgery   GERD (gastroesophageal reflux disease)    Hearing aid worn    Hx: of right ear   Hyperlipemia    Hypertension    Lumbar herniated disc    Lumbar stenosis    Macular degeneration right eye dry   PONV (postoperative nausea and vomiting)    takes iv nausea meds    Seasonal allergies    Hx: of   Stroke [facility])    Past Surgical History:  Procedure Laterality Date   BACK SURGERY  2013, x 3 with dr Yetta Barre 2014, 2016 and 2019   x 4, takes back injections monthly   COLONOSCOPY     Hx: of   ENDARTERECTOMY Right 02/07/2020   Procedure: ENDARTERECTOMY CAROTID;  Surgeon: Nada Libman, MD;  Location: [facility] OR;  Service: Vascular;  Laterality: Right;   EYE SURGERY     bil catarcts 02/2016   HERNIA REPAIR  several yrs ago   inguinal   INCISION AND DRAINAGE ABSCESS N/A 04/02/2023   Procedure: INCISION AND DRAINAGE,  LEFT GLUTEAL ABSCESS;  Surgeon: Sheliah Hatch De Blanch, MD;  Location: [facility] OR;  Service: General;  Laterality: N/A;   LUMBAR LAMINECTOMY  10/26/2012   Procedure: MICRODISCECTOMY LUMBAR  LAMINECTOMY;  Surgeon: Eldred Manges, MD;  Location: [facility] OR;  Service: Orthopedics;  Laterality: Right;  Right L4-5 Microdiscectomy   LUMBAR WOUND DEBRIDEMENT N/A 09/15/2021   Procedure: revision of thoracic incision, Irrigation and debridement of battery pocket wound;  Surgeon: Tia Alert, MD;  Location: [facility] OR;  Service: Neurosurgery;  Laterality: N/A;   MAXIMUM ACCESS (MAS)POSTERIOR LUMBAR INTERBODY FUSION (PLIF) 1 LEVEL N/A 04/15/2015   Procedure: Maximum Access Surgery Posterior Lumbar Interbody Fusion Lumbar two-Lumbar three extension of hardware;  Surgeon: Tia Alert, MD;  Location: [facility] NEURO ORS;  Service: Neurosurgery;  Laterality: N/A;   PATCH ANGIOPLASTY Right 02/07/2020   Procedure: PATCH ANGIOPLASTY USING Livia Snellen BIOLOGIC PATCH;  Surgeon: Nada Libman, MD;  Location: [facility] OR;  Service: Vascular;  Laterality: Right;   ROTATOR CUFF REPAIR     right shoulder - 3 times   SPINAL CORD STIMULATOR INSERTION  08/11/2021   TONSILLECTOMY  as child   TRANSURETHRAL RESECTION OF BLADDER TUMOR N/A 02/24/2017   Procedure: TRANSURETHRAL RESECTION OF BLADDER TUMOR (TURBT);  Surgeon: Sebastian Ache, MD;  Location: [facility] ORS;  Service: Urology;  Laterality: N/A;   TRANSURETHRAL  RESECTION OF PROSTATE N/A 11/20/2020   Procedure: TRANSURETHRAL RESECTION OF THE PROSTATE (TURP);  Surgeon: Sebastian Ache, MD;  Location: [facility];  Service: Urology;  Laterality: N/A;   Patient Active Problem List   Diagnosis Date Noted   Perianal abscess 04/02/2023   Hyponatremia 04/02/2023   Delirium 04/01/2023   Weakness 04/01/2023   Acute cystitis without hematuria 02/23/2023   Syncope and collapse 02/22/2023   AKI (acute kidney injury) (HCC) 02/22/2023   Unilateral primary osteoarthritis, right hip 03/30/2022   Prostatic hypertrophy 11/20/2020   Internal carotid artery stenosis 02/05/2020   Essential hypertension 02/05/2020   Benign prostatic hyperplasia with urinary retention 02/05/2020    GERD (gastroesophageal reflux disease) 02/05/2020   Chronic back pain 02/05/2020   Vision loss of right eye 02/04/2020   Wound drainage 08/20/2018   Chronic pain of both shoulders 02/22/2017   S/P lumbar spinal fusion 04/15/2015   HNP (herniated nucleus pulposus), lumbar 10/26/2012    Class: Diagnosis of    ONSET DATE: 03/31/23  REFERRING DIAG: R26.89 (ICD-10-CM) - Balance disorder   THERAPY DIAG:  Unsteadiness on feet  Other abnormalities of gait and mobility  Difficulty in walking, not elsewhere classified  Muscle weakness (generalized)  Rationale for Evaluation and Treatment: Rehabilitation  SUBJECTIVE:                                                                                                                                                                                             SUBJECTIVE STATEMENT: Patient reports doing well, but does report worsening of blurred vision today. MD aware, per patient. BP: 153/69. Denies falls.   PERTINENT HISTORY: Patient with a history of BPH w/ urinary retention, chronic pain s/p lumbar fusion w/ spinal cord stimulator (~2022), HTN, CAD, DVT, HLD, and CVA (~)2021  PAIN:  Are you having pain? No  PRECAUTIONS: Fall  PATIENT GOALS: improve walking   TODAY'S TREATMENT:  [facility] PT Assessment - 08/25/23 0001       Standardized Balance Assessment   Standardized Balance Assessment Timed Up and Go Test    10 Meter Walk .73m/s      Berg Balance Test   Sit to Stand Able to stand without using hands and stabilize independently    Standing Unsupported Able to stand safely 2 minutes    Sitting with Back Unsupported but Feet Supported on Floor or Stool Able to sit safely and securely 2 minutes    Stand to Sit Sits safely with minimal use of hands    Transfers Able to transfer safely, minor use of hands     Standing Unsupported with Eyes Closed Able to stand 10 seconds safely    Standing Unsupported with Feet Together Able to place feet together independently and stand 1 minute safely    From Standing, Reach Forward with Outstretched Arm Can reach forward >12 cm safely (5")    From Standing Position, Pick up Object from Floor Able to pick up shoe, needs supervision    From Standing Position, Turn to Look Behind Over each Shoulder Looks behind from both sides and weight shifts well    Turn 360 Degrees Able to turn 360 degrees safely but slowly    Standing Unsupported, Alternately Place Feet on Step/Stool Able to complete >2 steps/needs minimal assist    Standing Unsupported, One Foot in Front Able to take small step independently and hold 30 seconds    Standing on One Leg Tries to lift leg/unable to hold 3 seconds but remains standing independently    Total Score 44      Timed Up and Go Test   Normal TUG (seconds) 22.13            Scifit level 3 x10 mins B LE only for strength  Seated hip flex over kettlebell   PATIENT EDUCATION: Education details: continue HEP, exam results Person educated: Patient Education method: Explanation Education comprehension: verbalized understanding and needs further education  HOME EXERCISE PROGRAM: Access Code: [email or web address] URL: [email or web address] Date: 08/08/2023 Prepared by: Merry Lofty  Exercises - Supine March  - 1 x daily - 7 x weekly - 3 sets - 10 reps - Sit to Stand  - 1 x daily - 7 x weekly - 2 sets - 5 reps - Seated March  - 1 x daily - 7 x weekly - 3 sets - 10 reps - Standing Tandem Balance with Counter Support  - 1 x daily - 7 x weekly - 3 sets - 3 reps - 30s hold - Narrow Stance with Unilateral Counter Support  - 1 x daily - 7 x weekly - 3 sets - 3 reps - 30s hold - Standing March with Counter Support  - 1 x daily - 7 x weekly - 3 sets - 10 reps - Backwards Walking  - 1 x daily - 7 x weekly - 3 sets - 10  reps  GOALS: Goals reviewed with patient? Yes  SHORT TERM GOALS: Target date: 08/25/2023   Patient will be able to perform standing forward reach to 5" to improve static balance. Baseline: can safey reach 2" (07/28/23) Goal status: INITIAL  2.  Pt will be I and compliant with HEP to self manage symtpoms Baseline: provided Goal status: MET  LONG TERM GOALS: Target date: 09/22/2023   Patient will demo BBS score of >48/56 to improve static balance and reduce fall risk  Baseline: 41/56 (07/28/23) Goal status: INITIAL  2.  Pt will demo gait speed of >0.55 m/s with RW to improve functional gait and reduce fall risk. Baseline: 0.47 m/s; .46m/s Goal status: INITIAL  3.  Pt will demo TUG of <20 sec with RW to improve functional mobility and reduce fall risk  Baseline: 29 sec with RW Goal status: INITIAL  4.  Pt will be I and compliant with HEP to self manage symptoms. Baseline: TBD Goal status: INITIAL  ASSESSMENT: CLINICAL IMPRESSION: Patient seen for skilled PT session with emphasis on goal assessment and general strengthening. Patient completed the Timed Up and Go test (TUG) in 22.13 seconds.  Geriatrics: need for further assessment of fall risk: > 12 sec; Recurrent falls: > 15 sec; Vestibular Disorders fall risk: > 15 sec; Parkinson's Disease fall risk: > 16 sec ([email or web address], 2023). Patient demonstrated increased fall risk noted by score of 44/56 on the [facility] Scale.  <45/56 = fall risk, <42/56 = predictive of recurrent falls, <40/56 = 100% fall risk  >41 = independent, 21-40 = assistive device, 0-20 = wheelchair level  MDC 6.9 (4 pts 45-56, 5 pts 35-44, 7 pts 25-34) (ANPTA Core Set of Outcome Measures for Adults with Neurologic Conditions, 2018). 10 Meter Walk Test: Patient instructed to walk 10 meters (32.8 ft) as quickly and as safely as possible at their normal speed x2 and at a fast speed x2. Time measured from 2 meter mark to 8 meter mark to accommodate ramp-up and  ramp-down.  Normal speed: .67m/s Cut off scores: <0.4 m/s = household Ambulator, 0.4-0.8 m/s = limited community Ambulator, >0.8 m/s = community Ambulator, >1.2 m/s = crossing a street, <1.0 = increased fall risk MCID 0.05 m/s (small), 0.13 m/s (moderate), 0.06 m/s (significant)  (ANPTA Core Set of Outcome Measures for Adults with Neurologic Conditions, 2018). He continues to require max cuing to remain within walker frame. He is able to maintain this for ~13ft before resuming very flexed posture and L foot everted outside walker frame. He remains relatively resistant to education on safety.   OBJECTIVE IMPAIRMENTS: Abnormal gait, decreased balance, decreased coordination, decreased mobility, difficulty walking, decreased safety awareness, hypomobility, increased muscle spasms, impaired flexibility, impaired UE functional use, impaired vision/preception, postural dysfunction, and pain.   ACTIVITY LIMITATIONS: carrying, lifting, standing, squatting, stairs, transfers, bathing, reach over head, and hygiene/grooming  PARTICIPATION LIMITATIONS: meal prep, cleaning, laundry, driving, community activity, and yard work  PERSONAL FACTORS: Age, Time since onset of injury/illness/exacerbation, and 3+ comorbidities: Impaired vision in R>L eye, lumbar fusion, L complete rotator cuff tear and inability to flex arm  are also affecting patient's functional outcome.   REHAB POTENTIAL: Good  CLINICAL DECISION MAKING: Stable/uncomplicated  EVALUATION COMPLEXITY: Moderate  PLAN:  PT FREQUENCY: 2x/week  PT DURATION: 8 weeks  PLANNED INTERVENTIONS: Therapeutic exercises, Therapeutic activity, Neuromuscular re-education, Balance training, Gait training, Patient/Family education, Self Care, Joint mobilization, Stair training, Orthotic/Fit training, Aquatic Therapy, Cryotherapy, Moist heat, Manual therapy, and Re-evaluation  PLAN FOR NEXT SESSION:  Posture, fwd/backward walking, staying closer to walker frame,  dynamic standing balance tasks, R LE SLS tasks?   Jay Tran, PT Jay Tran, PT, DPT, CBIS  08/25/2023, 3:26 PM

## 2023-08-28 ENCOUNTER — Ambulatory Visit: Payer: Medicare Other

## 2023-08-28 DIAGNOSIS — M6281 Muscle weakness (generalized): Secondary | ICD-10-CM

## 2023-08-28 DIAGNOSIS — R262 Difficulty in walking, not elsewhere classified: Secondary | ICD-10-CM

## 2023-08-28 DIAGNOSIS — R2689 Other abnormalities of gait and mobility: Secondary | ICD-10-CM

## 2023-08-28 DIAGNOSIS — R2681 Unsteadiness on feet: Secondary | ICD-10-CM

## 2023-08-28 NOTE — Therapy (Signed)
OUTPATIENT PHYSICAL THERAPY NEURO TREATMENT   Patient Name: Jay Tran MRN: [id number] DOB:[birth year], 89 y.o., male Today's Date: 08/28/2023   PCP: Dr. Windle Guard REFERRING PROVIDER: Dr. Windle Guard  END OF SESSION:  PT End of Session - 08/28/23 1306     Visit Number 8    Number of Visits 13    Date for PT Re-Evaluation 09/22/23    Authorization Type Medicare    Progress Note Due on Visit 10    PT Start Time 1313    PT Stop Time 1400    PT Time Calculation (min) 47 min    Equipment Utilized During Treatment Gait belt    Activity Tolerance Patient tolerated treatment well    Behavior During Therapy WFL for tasks assessed/performed             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 (HCC)    Hx: of after 2013 back surgery   GERD (gastroesophageal reflux disease)    Hearing aid worn    Hx: of right ear   Hyperlipemia    Hypertension    Lumbar herniated disc    Lumbar stenosis    Macular degeneration right eye dry   PONV (postoperative nausea and vomiting)    takes iv nausea meds    Seasonal allergies    Hx: of   Stroke [facility])    Past Surgical History:  Procedure Laterality Date   BACK SURGERY  2013, x 3 with dr Yetta Barre 2014, 2016 and 2019   x 4, takes back injections monthly   COLONOSCOPY     Hx: of   ENDARTERECTOMY Right 02/07/2020   Procedure: ENDARTERECTOMY CAROTID;  Surgeon: Nada Libman, MD;  Location: [facility] OR;  Service: Vascular;  Laterality: Right;   EYE SURGERY     bil catarcts 02/2016   HERNIA REPAIR  several yrs ago   inguinal   INCISION AND DRAINAGE ABSCESS N/A 04/02/2023   Procedure: INCISION AND DRAINAGE,  LEFT GLUTEAL ABSCESS;  Surgeon: Sheliah Hatch De Blanch, MD;  Location: [facility] OR;  Service: General;  Laterality: N/A;   LUMBAR LAMINECTOMY  10/26/2012   Procedure: MICRODISCECTOMY LUMBAR  LAMINECTOMY;  Surgeon: Eldred Manges, MD;  Location: [facility] OR;  Service: Orthopedics;  Laterality: Right;  Right L4-5 Microdiscectomy   LUMBAR WOUND DEBRIDEMENT N/A 09/15/2021   Procedure: revision of thoracic incision, Irrigation and debridement of battery pocket wound;  Surgeon: Tia Alert, MD;  Location: [facility] OR;  Service: Neurosurgery;  Laterality: N/A;   MAXIMUM ACCESS (MAS)POSTERIOR LUMBAR INTERBODY FUSION (PLIF) 1 LEVEL N/A 04/15/2015   Procedure: Maximum Access Surgery Posterior Lumbar Interbody Fusion Lumbar two-Lumbar three extension of hardware;  Surgeon: Tia Alert, MD;  Location: [facility] NEURO ORS;  Service: Neurosurgery;  Laterality: N/A;   PATCH ANGIOPLASTY Right 02/07/2020   Procedure: PATCH ANGIOPLASTY USING Livia Snellen BIOLOGIC PATCH;  Surgeon: Nada Libman, MD;  Location: [facility] OR;  Service: Vascular;  Laterality: Right;   ROTATOR CUFF REPAIR     right shoulder - 3 times   SPINAL CORD STIMULATOR INSERTION  08/11/2021   TONSILLECTOMY  as child   TRANSURETHRAL RESECTION OF BLADDER TUMOR N/A 02/24/2017   Procedure: TRANSURETHRAL RESECTION OF BLADDER TUMOR (TURBT);  Surgeon: Sebastian Ache, MD;  Location: [facility] ORS;  Service: Urology;  Laterality: N/A;   TRANSURETHRAL  RESECTION OF PROSTATE N/A 11/20/2020   Procedure: TRANSURETHRAL RESECTION OF THE PROSTATE (TURP);  Surgeon: Sebastian Ache, MD;  Location: [facility];  Service: Urology;  Laterality: N/A;   Patient Active Problem List   Diagnosis Date Noted   Perianal abscess 04/02/2023   Hyponatremia 04/02/2023   Delirium 04/01/2023   Weakness 04/01/2023   Acute cystitis without hematuria 02/23/2023   Syncope and collapse 02/22/2023   AKI (acute kidney injury) (HCC) 02/22/2023   Unilateral primary osteoarthritis, right hip 03/30/2022   Prostatic hypertrophy 11/20/2020   Internal carotid artery stenosis 02/05/2020   Essential hypertension 02/05/2020   Benign prostatic hyperplasia with urinary retention 02/05/2020    GERD (gastroesophageal reflux disease) 02/05/2020   Chronic back pain 02/05/2020   Vision loss of right eye 02/04/2020   Wound drainage 08/20/2018   Chronic pain of both shoulders 02/22/2017   S/P lumbar spinal fusion 04/15/2015   HNP (herniated nucleus pulposus), lumbar 10/26/2012    Class: Diagnosis of    ONSET DATE: 03/31/23  REFERRING DIAG: R26.89 (ICD-10-CM) - Balance disorder   THERAPY DIAG:  Unsteadiness on feet  Other abnormalities of gait and mobility  Difficulty in walking, not elsewhere classified  Muscle weakness (generalized)  Rationale for Evaluation and Treatment: Rehabilitation  SUBJECTIVE:                                                                                                                                                                                             SUBJECTIVE STATEMENT: Patient reports doing well. Vision seems to be better today. Denies falls. Did have a close call leaving the [employer] and got his feet tangled, was using RW. Does report not always using RW at home tho.   PERTINENT HISTORY: Patient with a history of BPH w/ urinary retention, chronic pain s/p lumbar fusion w/ spinal cord stimulator (~2022), HTN, CAD, DVT, HLD, and CVA (~)2021  PAIN:  Are you having pain? No  PRECAUTIONS: Fall  PATIENT GOALS: improve walking   TODAY'S TREATMENT:                                                                                                                              -  patient requesting to begin with scifit   -level 3 hills x10 mins B UE/LE -simulating swinging leg over stool as if it were bike   -// bars + MinA  -~63ft SPC MinA  -~73ft SBQC MinA -143ft SPC + MinA-> ModA for last 41ft   PATIENT EDUCATION: Education details: continue HEP, VERY short household distance when using SPC Person educated: Patient Education method: Explanation Education comprehension: verbalized understanding and needs further education  HOME EXERCISE  PROGRAM: Access Code: [email or web address] URL: [email or web address] Date: 08/08/2023 Prepared by: Merry Lofty  Exercises - Supine March  - 1 x daily - 7 x weekly - 3 sets - 10 reps - Sit to Stand  - 1 x daily - 7 x weekly - 2 sets - 5 reps - Seated March  - 1 x daily - 7 x weekly - 3 sets - 10 reps - Standing Tandem Balance with Counter Support  - 1 x daily - 7 x weekly - 3 sets - 3 reps - 30s hold - Narrow Stance with Unilateral Counter Support  - 1 x daily - 7 x weekly - 3 sets - 3 reps - 30s hold - Standing March with Counter Support  - 1 x daily - 7 x weekly - 3 sets - 10 reps - Backwards Walking  - 1 x daily - 7 x weekly - 3 sets - 10 reps  GOALS: Goals reviewed with patient? Yes  SHORT TERM GOALS: Target date: 08/25/2023   Patient will be able to perform standing forward reach to 5" to improve static balance. Baseline: can safey reach 2" (07/28/23), 5" Goal status: MET  2.  Pt will be I and compliant with HEP to self manage symtpoms Baseline: provided Goal status: MET  LONG TERM GOALS: Target date: 09/22/2023   Patient will demo BBS score of >48/56 to improve static balance and reduce fall risk  Baseline: 41/56 (07/28/23) Goal status: INITIAL  2.  Pt will demo gait speed of >0.55 m/s with RW to improve functional gait and reduce fall risk. Baseline: 0.47 m/s; .35m/s Goal status: INITIAL  3.  Pt will demo TUG of <20 sec with RW to improve functional mobility and reduce fall risk  Baseline: 29 sec with RW Goal status: INITIAL  4.  Pt will be I and compliant with HEP to self manage symptoms. Baseline: TBD Goal status: INITIAL  ASSESSMENT: CLINICAL IMPRESSION: Patient seen for skilled PT session with emphasis on global strengthening and gait retraining. Patient able to mount simulated bike seat, but with MinA and stable B UE support- patient remains not safe to use upright bike at home. Limited functional LE strength and endurance noted with decreased AD support.  Discussed this with patient. He is only safe to use [facility] for VERY short household distances (~62ft). He verbalized understanding. Continue POC.   OBJECTIVE IMPAIRMENTS: Abnormal gait, decreased balance, decreased coordination, decreased mobility, difficulty walking, decreased safety awareness, hypomobility, increased muscle spasms, impaired flexibility, impaired UE functional use, impaired vision/preception, postural dysfunction, and pain.   ACTIVITY LIMITATIONS: carrying, lifting, standing, squatting, stairs, transfers, bathing, reach over head, and hygiene/grooming  PARTICIPATION LIMITATIONS: meal prep, cleaning, laundry, driving, community activity, and yard work  PERSONAL FACTORS: Age, Time since onset of injury/illness/exacerbation, and 3+ comorbidities: Impaired vision in R>L eye, lumbar fusion, L complete rotator cuff tear and inability to flex arm  are also affecting patient's functional outcome.   REHAB POTENTIAL: Good  CLINICAL DECISION MAKING: Stable/uncomplicated  EVALUATION COMPLEXITY: Moderate  PLAN:  PT  FREQUENCY: 2x/week  PT DURATION: 8 weeks  PLANNED INTERVENTIONS: Therapeutic exercises, Therapeutic activity, Neuromuscular re-education, Balance training, Gait training, Patient/Family education, Self Care, Joint mobilization, Stair training, Orthotic/Fit training, Aquatic Therapy, Cryotherapy, Moist heat, Manual therapy, and Re-evaluation  PLAN FOR NEXT SESSION:  Posture, fwd/backward walking, staying closer to walker frame, dynamic standing balance tasks, R LE SLS tasks?   Westley Foots, PT Westley Foots, PT, DPT, CBIS  08/28/2023, 2:30 PM

## 2023-09-01 ENCOUNTER — Ambulatory Visit: Payer: Medicare Other

## 2023-09-01 DIAGNOSIS — M6281 Muscle weakness (generalized): Secondary | ICD-10-CM

## 2023-09-01 DIAGNOSIS — R2681 Unsteadiness on feet: Secondary | ICD-10-CM | POA: Diagnosis not present

## 2023-09-01 DIAGNOSIS — R2689 Other abnormalities of gait and mobility: Secondary | ICD-10-CM

## 2023-09-01 DIAGNOSIS — R262 Difficulty in walking, not elsewhere classified: Secondary | ICD-10-CM

## 2023-09-01 DIAGNOSIS — R296 Repeated falls: Secondary | ICD-10-CM

## 2023-09-01 NOTE — Therapy (Signed)
OUTPATIENT PHYSICAL THERAPY NEURO TREATMENT   Patient Name: Jay Tran MRN: [id number] DOB:18-Aug-1934, 89 y.o., male Today's Date: 09/01/2023   PCP: Dr. Windle Guard REFERRING PROVIDER: Dr. Windle Guard  END OF SESSION:  PT End of Session - 09/01/23 1327     Visit Number 9    Number of Visits 13    Date for PT Re-Evaluation 09/22/23    Authorization Type Medicare    Progress Note Due on Visit 10    PT Start Time 1315    PT Stop Time 1400    PT Time Calculation (min) 45 min    Equipment Utilized During Treatment Gait belt    Activity Tolerance Patient tolerated treatment well    Behavior During Therapy WFL for tasks assessed/performed             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 (HCC)    Hx: of after 2013 back surgery   GERD (gastroesophageal reflux disease)    Hearing aid worn    Hx: of right ear   Hyperlipemia    Hypertension    Lumbar herniated disc    Lumbar stenosis    Macular degeneration right eye dry   PONV (postoperative nausea and vomiting)    takes iv nausea meds    Seasonal allergies    Hx: of   Stroke [facility])    Past Surgical History:  Procedure Laterality Date   BACK SURGERY  2013, x 3 with dr Yetta Barre 2014, 2016 and 2019   x 4, takes back injections monthly   COLONOSCOPY     Hx: of   ENDARTERECTOMY Right 02/07/2020   Procedure: ENDARTERECTOMY CAROTID;  Surgeon: Nada Libman, MD;  Location: [facility] OR;  Service: Vascular;  Laterality: Right;   EYE SURGERY     bil catarcts 02/2016   HERNIA REPAIR  several yrs ago   inguinal   INCISION AND DRAINAGE ABSCESS N/A 04/02/2023   Procedure: INCISION AND DRAINAGE,  LEFT GLUTEAL ABSCESS;  Surgeon: Sheliah Hatch De Blanch, MD;  Location: [facility] OR;  Service: General;  Laterality: N/A;   LUMBAR LAMINECTOMY  10/26/2012   Procedure: MICRODISCECTOMY LUMBAR  LAMINECTOMY;  Surgeon: Eldred Manges, MD;  Location: [facility] OR;  Service: Orthopedics;  Laterality: Right;  Right L4-5 Microdiscectomy   LUMBAR WOUND DEBRIDEMENT N/A 09/15/2021   Procedure: revision of thoracic incision, Irrigation and debridement of battery pocket wound;  Surgeon: Tia Alert, MD;  Location: [facility] OR;  Service: Neurosurgery;  Laterality: N/A;   MAXIMUM ACCESS (MAS)POSTERIOR LUMBAR INTERBODY FUSION (PLIF) 1 LEVEL N/A 04/15/2015   Procedure: Maximum Access Surgery Posterior Lumbar Interbody Fusion Lumbar two-Lumbar three extension of hardware;  Surgeon: Tia Alert, MD;  Location: [facility] NEURO ORS;  Service: Neurosurgery;  Laterality: N/A;   PATCH ANGIOPLASTY Right 02/07/2020   Procedure: PATCH ANGIOPLASTY USING Livia Snellen BIOLOGIC PATCH;  Surgeon: Nada Libman, MD;  Location: [facility] OR;  Service: Vascular;  Laterality: Right;   ROTATOR CUFF REPAIR     right shoulder - 3 times   SPINAL CORD STIMULATOR INSERTION  08/11/2021   TONSILLECTOMY  as child   TRANSURETHRAL RESECTION OF BLADDER TUMOR N/A 02/24/2017   Procedure: TRANSURETHRAL RESECTION OF BLADDER TUMOR (TURBT);  Surgeon: Sebastian Ache, MD;  Location: [facility] ORS;  Service: Urology;  Laterality: N/A;   TRANSURETHRAL  RESECTION OF PROSTATE N/A 11/20/2020   Procedure: TRANSURETHRAL RESECTION OF THE PROSTATE (TURP);  Surgeon: Sebastian Ache, MD;  Location: [facility];  Service: Urology;  Laterality: N/A;   Patient Active Problem List   Diagnosis Date Noted   Perianal abscess 04/02/2023   Hyponatremia 04/02/2023   Delirium 04/01/2023   Weakness 04/01/2023   Acute cystitis without hematuria 02/23/2023   Syncope and collapse 02/22/2023   AKI (acute kidney injury) (HCC) 02/22/2023   Unilateral primary osteoarthritis, right hip 03/30/2022   Prostatic hypertrophy 11/20/2020   Internal carotid artery stenosis 02/05/2020   Essential hypertension 02/05/2020   Benign prostatic hyperplasia with urinary retention 02/05/2020    GERD (gastroesophageal reflux disease) 02/05/2020   Chronic back pain 02/05/2020   Vision loss of right eye 02/04/2020   Wound drainage 08/20/2018   Chronic pain of both shoulders 02/22/2017   S/P lumbar spinal fusion 04/15/2015   HNP (herniated nucleus pulposus), lumbar 10/26/2012    Class: Diagnosis of    ONSET DATE: 03/31/23  REFERRING DIAG: R26.89 (ICD-10-CM) - Balance disorder   THERAPY DIAG:  Unsteadiness on feet  Difficulty in walking, not elsewhere classified  Other abnormalities of gait and mobility  Muscle weakness (generalized)  Repeated falls  Rationale for Evaluation and Treatment: Rehabilitation  SUBJECTIVE:                                                                                                                                                                                             SUBJECTIVE STATEMENT: Pt reports since therapy, his legs feel better and stronger. No falls.  PERTINENT HISTORY: Patient with a history of BPH w/ urinary retention, chronic pain s/p lumbar fusion w/ spinal cord stimulator (~2022), HTN, CAD, DVT, HLD, and CVA (~)2021  PAIN:  Are you having pain? No  PRECAUTIONS: Fall  PATIENT GOALS: improve walking   TODAY'S TREATMENT:                                                                                                                              Ambulated 50  feet with st[place] cane with quad base, CGA, slow cadence and decreased step length Sci Fit: manual level 5 for 10' UE and LE  2 x 115' with st. Point cane with small quad tip. Cues for longer step length and erect posture. Blaze pods: 6 pods used, 3 pods on 1st step and 3 pods on 2nd step: random lights, pt holds on to bil hand rail; pt alternates feet R and L 3 trials: 1 min long, 28 hits, 32 hits, 34 hits 1 x 230' with st. Cane cues for heel to toe walking to improve foot clearance.      PATIENT EDUCATION: Education details: continue HEP, VERY short  household distance when using SPC Person educated: Patient Education method: Explanation Education comprehension: verbalized understanding and needs further education  HOME EXERCISE PROGRAM: Access Code: [email or web address] URL: [email or web address] Date: 08/08/2023 Prepared by: Merry Lofty  Exercises - Supine March  - 1 x daily - 7 x weekly - 3 sets - 10 reps - Sit to Stand  - 1 x daily - 7 x weekly - 2 sets - 5 reps - Seated March  - 1 x daily - 7 x weekly - 3 sets - 10 reps - Standing Tandem Balance with Counter Support  - 1 x daily - 7 x weekly - 3 sets - 3 reps - 30s hold - Narrow Stance with Unilateral Counter Support  - 1 x daily - 7 x weekly - 3 sets - 3 reps - 30s hold - Standing March with Counter Support  - 1 x daily - 7 x weekly - 3 sets - 10 reps - Backwards Walking  - 1 x daily - 7 x weekly - 3 sets - 10 reps  GOALS: Goals reviewed with patient? Yes  SHORT TERM GOALS: Target date: 08/25/2023   Patient will be able to perform standing forward reach to 5" to improve static balance. Baseline: can safey reach 2" (07/28/23), 5" Goal status: MET  2.  Pt will be I and compliant with HEP to self manage symtpoms Baseline: provided Goal status: MET  LONG TERM GOALS: Target date: 09/22/2023   Patient will demo BBS score of >48/56 to improve static balance and reduce fall risk  Baseline: 41/56 (07/28/23) Goal status: INITIAL  2.  Pt will demo gait speed of >0.55 m/s with RW to improve functional gait and reduce fall risk. Baseline: 0.47 m/s; .97m/s Goal status: INITIAL  3.  Pt will demo TUG of <20 sec with RW to improve functional mobility and reduce fall risk  Baseline: 29 sec with RW Goal status: INITIAL  4.  Pt will be I and compliant with HEP to self manage symptoms. Baseline: TBD Goal status: INITIAL  ASSESSMENT: CLINICAL IMPRESSION: Pt progressing well with st. Point cane. Patient still demo shuffling gait with small steps and decreased foot clearance.  Pt also has significant visual limitations for which he slows down around the corner of objects when walking.  OBJECTIVE IMPAIRMENTS: Abnormal gait, decreased balance, decreased coordination, decreased mobility, difficulty walking, decreased safety awareness, hypomobility, increased muscle spasms, impaired flexibility, impaired UE functional use, impaired vision/preception, postural dysfunction, and pain.   ACTIVITY LIMITATIONS: carrying, lifting, standing, squatting, stairs, transfers, bathing, reach over head, and hygiene/grooming  PARTICIPATION LIMITATIONS: meal prep, cleaning, laundry, driving, community activity, and yard work  PERSONAL FACTORS: Age, Time since onset of injury/illness/exacerbation, and 3+ comorbidities: Impaired vision in R>L eye, lumbar fusion, L complete rotator cuff tear and inability to flex arm  are  also affecting patient's functional outcome.   REHAB POTENTIAL: Good  CLINICAL DECISION MAKING: Stable/uncomplicated  EVALUATION COMPLEXITY: Moderate  PLAN:  PT FREQUENCY: 2x/week  PT DURATION: 8 weeks  PLANNED INTERVENTIONS: Therapeutic exercises, Therapeutic activity, Neuromuscular re-education, Balance training, Gait training, Patient/Family education, Self Care, Joint mobilization, Stair training, Orthotic/Fit training, Aquatic Therapy, Cryotherapy, Moist heat, Manual therapy, and Re-evaluation  PLAN FOR NEXT SESSION:  10TH VISIT PROGRESS NOTE NEXT SESSION Posture, fwd/backward walking, staying closer to walker frame, dynamic standing balance tasks, R LE SLS tasks?   Ileana Ladd, PT 09/01/2023, 1:27 PM

## 2023-09-04 ENCOUNTER — Ambulatory Visit: Payer: Medicare Other

## 2023-09-04 DIAGNOSIS — M6281 Muscle weakness (generalized): Secondary | ICD-10-CM

## 2023-09-04 DIAGNOSIS — R2681 Unsteadiness on feet: Secondary | ICD-10-CM | POA: Diagnosis not present

## 2023-09-04 DIAGNOSIS — R262 Difficulty in walking, not elsewhere classified: Secondary | ICD-10-CM

## 2023-09-04 DIAGNOSIS — R2689 Other abnormalities of gait and mobility: Secondary | ICD-10-CM

## 2023-09-04 NOTE — Therapy (Signed)
OUTPATIENT PHYSICAL THERAPY NEURO TREATMENT- 10TH VISIT PROGRESS NOTE   Patient Name: Jay Tran MRN: [id number] DOB:01/23/1934, 89 y.o., male Today's Date: 09/04/2023   PCP: Dr. Windle Guard REFERRING PROVIDER: Dr. Windle Guard  Physical Therapy Progress Note   Dates of Reporting Period:07/28/23- 09/04/23  See Note below for Objective Data and Assessment of Progress/Goals.  Thank you for the referral of this patient. Westley Foots, PT, DPT, CBIS   END OF SESSION:  PT End of Session - 09/04/23 1310     Visit Number 10    Number of Visits 13    Date for PT Re-Evaluation 09/22/23    Authorization Type Medicare    Progress Note Due on Visit 10    PT Start Time 1315    PT Stop Time 1356    PT Time Calculation (min) 41 min    Equipment Utilized During Treatment Gait belt    Activity Tolerance Patient tolerated treatment well    Behavior During Therapy WFL for tasks assessed/performed             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 (HCC)    Hx: of after 2013 back surgery   GERD (gastroesophageal reflux disease)    Hearing aid worn    Hx: of right ear   Hyperlipemia    Hypertension    Lumbar herniated disc    Lumbar stenosis    Macular degeneration right eye dry   PONV (postoperative nausea and vomiting)    takes iv nausea meds    Seasonal allergies    Hx: of   Stroke [facility])    Past Surgical History:  Procedure Laterality Date   BACK SURGERY  2013, x 3 with dr Yetta Barre 2014, 2016 and 2019   x 4, takes back injections monthly   COLONOSCOPY     Hx: of   ENDARTERECTOMY Right 02/07/2020   Procedure: ENDARTERECTOMY CAROTID;  Surgeon: Nada Libman, MD;  Location: [facility] OR;  Service: Vascular;  Laterality: Right;   EYE SURGERY     bil catarcts 02/2016   HERNIA REPAIR  several yrs ago   inguinal   INCISION AND  DRAINAGE ABSCESS N/A 04/02/2023   Procedure: INCISION AND DRAINAGE,  LEFT GLUTEAL ABSCESS;  Surgeon: Sheliah Hatch De Blanch, MD;  Location: [facility] OR;  Service: General;  Laterality: N/A;   LUMBAR LAMINECTOMY  10/26/2012   Procedure: MICRODISCECTOMY LUMBAR LAMINECTOMY;  Surgeon: Eldred Manges, MD;  Location: [facility] OR;  Service: Orthopedics;  Laterality: Right;  Right L4-5 Microdiscectomy   LUMBAR WOUND DEBRIDEMENT N/A 09/15/2021   Procedure: revision of thoracic incision, Irrigation and debridement of battery pocket wound;  Surgeon: Tia Alert, MD;  Location: [facility] OR;  Service: Neurosurgery;  Laterality: N/A;   MAXIMUM ACCESS (MAS)POSTERIOR LUMBAR INTERBODY FUSION (PLIF) 1 LEVEL N/A 04/15/2015   Procedure: Maximum Access Surgery Posterior Lumbar Interbody Fusion Lumbar two-Lumbar three extension of hardware;  Surgeon: Tia Alert, MD;  Location: [facility] NEURO ORS;  Service: Neurosurgery;  Laterality: N/A;   PATCH ANGIOPLASTY Right 02/07/2020   Procedure: PATCH ANGIOPLASTY USING Livia Snellen BIOLOGIC PATCH;  Surgeon: Nada Libman, MD;  Location: [facility] OR;  Service: Vascular;  Laterality: Right;   ROTATOR CUFF REPAIR     right shoulder - 3 times   SPINAL CORD STIMULATOR INSERTION  08/11/2021   TONSILLECTOMY  as child   TRANSURETHRAL RESECTION OF BLADDER TUMOR N/A 02/24/2017   Procedure: TRANSURETHRAL RESECTION OF BLADDER TUMOR (TURBT);  Surgeon: Sebastian Ache, MD;  Location: [facility] ORS;  Service: Urology;  Laterality: N/A;   TRANSURETHRAL RESECTION OF PROSTATE N/A 11/20/2020   Procedure: TRANSURETHRAL RESECTION OF THE PROSTATE (TURP);  Surgeon: Sebastian Ache, MD;  Location: [facility];  Service: Urology;  Laterality: N/A;   Patient Active Problem List   Diagnosis Date Noted   Perianal abscess 04/02/2023   Hyponatremia 04/02/2023   Delirium 04/01/2023   Weakness 04/01/2023   Acute cystitis without hematuria 02/23/2023   Syncope and collapse 02/22/2023   AKI (acute kidney injury) (HCC)  02/22/2023   Unilateral primary osteoarthritis, right hip 03/30/2022   Prostatic hypertrophy 11/20/2020   Internal carotid artery stenosis 02/05/2020   Essential hypertension 02/05/2020   Benign prostatic hyperplasia with urinary retention 02/05/2020   GERD (gastroesophageal reflux disease) 02/05/2020   Chronic back pain 02/05/2020   Vision loss of right eye 02/04/2020   Wound drainage 08/20/2018   Chronic pain of both shoulders 02/22/2017   S/P lumbar spinal fusion 04/15/2015   HNP (herniated nucleus pulposus), lumbar 10/26/2012    Class: Diagnosis of    ONSET DATE: 03/31/23  REFERRING DIAG: R26.89 (ICD-10-CM) - Balance disorder   THERAPY DIAG:  Unsteadiness on feet  Difficulty in walking, not elsewhere classified  Other abnormalities of gait and mobility  Muscle weakness (generalized)  Rationale for Evaluation and Treatment: Rehabilitation  SUBJECTIVE:                                                                                                                                                                                             SUBJECTIVE STATEMENT: Patient reports doing well. Still using RW at home. Denies falls/near falls.   PERTINENT HISTORY: Patient with a history of BPH w/ urinary retention, chronic pain s/p lumbar fusion w/ spinal cord stimulator (~2022), HTN, CAD, DVT, HLD, and CVA (~)2021  PAIN:  Are you having pain? No  PRECAUTIONS: Fall  PATIENT GOALS: improve walking   TODAY'S TREATMENT:  [facility] PT Assessment - 09/04/23 0001       Standardized Balance Assessment   10 Meter Walk .72m      Berg Balance Test   Sit to Stand Able to stand without using hands and stabilize independently    Standing Unsupported Able to stand safely 2 minutes    Sitting with Back Unsupported but Feet Supported on Floor or Stool Able to sit  safely and securely 2 minutes    Stand to Sit Sits safely with minimal use of hands    Transfers Able to transfer safely, minor use of hands    Standing Unsupported with Eyes Closed Able to stand 10 seconds safely    Standing Unsupported with Feet Together Able to place feet together independently and stand 1 minute safely    From Standing, Reach Forward with Outstretched Arm Can reach forward >12 cm safely (5")    From Standing Position, Pick up Object from Floor Able to pick up shoe, needs supervision    From Standing Position, Turn to Look Behind Over each Shoulder Looks behind from both sides and weight shifts well    Turn 360 Degrees Able to turn 360 degrees safely but slowly    Standing Unsupported, Alternately Place Feet on Step/Stool Able to complete >2 steps/needs minimal assist    Standing Unsupported, One Foot in Front Able to take small step independently and hold 30 seconds    Standing on One Leg Tries to lift leg/unable to hold 3 seconds but remains standing independently    Total Score 44      Timed Up and Go Test   Normal TUG (seconds) 21.71            -standing toe taps -> 3# ankle weights      PATIENT EDUCATION: Education details: continue HEP, VERY short household distance when using [facility] Person educated: Patient Education method: Explanation Education comprehension: verbalized understanding and needs further education  HOME EXERCISE PROGRAM: Access Code: [email or web address] URL: [email or web address] Date: 08/08/2023 Prepared by: Merry Lofty  Exercises - Supine March  - 1 x daily - 7 x weekly - 3 sets - 10 reps - Sit to Stand  - 1 x daily - 7 x weekly - 2 sets - 5 reps - Seated March  - 1 x daily - 7 x weekly - 3 sets - 10 reps - Standing Tandem Balance with Counter Support  - 1 x daily - 7 x weekly - 3 sets - 3 reps - 30s hold - Narrow Stance with Unilateral Counter Support  - 1 x daily - 7 x weekly - 3 sets - 3 reps - 30s hold - Standing March  with Counter Support  - 1 x daily - 7 x weekly - 3 sets - 10 reps - Backwards Walking  - 1 x daily - 7 x weekly - 3 sets - 10 reps  GOALS: Goals reviewed with patient? Yes  SHORT TERM GOALS: Target date: 08/25/2023   Patient will be able to perform standing forward reach to 5" to improve static balance. Baseline: can safey reach 2" (07/28/23), 5" Goal status: MET  2.  Pt will be I and compliant with HEP to self manage symtpoms Baseline: provided Goal status: MET  LONG TERM GOALS: Target date: 09/22/2023   Patient will demo BBS score of >48/56 to improve static balance and reduce fall risk  Baseline: 41/56 (07/28/23) Goal status: INITIAL  2.  Pt will demo gait speed of >0.55 m/s with RW  to improve functional gait and reduce fall risk. Baseline: 0.47 m/s; .34m/s Goal status: INITIAL  3.  Pt will demo TUG of <20 sec with RW to improve functional mobility and reduce fall risk  Baseline: 29 sec with RW Goal status: INITIAL  4.  Pt will be I and compliant with HEP to self manage symptoms. Baseline: TBD Goal status: INITIAL  ASSESSMENT: CLINICAL IMPRESSION: Patient seen for skilled PT session with emphasis on outcome measure assessment. 10 Meter Walk Test: Patient instructed to walk 10 meters (32.8 ft) as quickly and as safely as possible at their normal speed x2 and at a fast speed x2. Time measured from 2 meter mark to 8 meter mark to accommodate ramp-up and ramp-down.  Normal speed: .37m/s Cut off scores: <0.4 m/s = household Ambulator, 0.4-0.8 m/s = limited community Ambulator, >0.8 m/s = community Ambulator, >1.2 m/s = crossing a street, <1.0 = increased fall risk MC[facility] 0.05 m/s (small), 0.13 m/s (moderate), 0.06 m/s (significant)  (ANPTA Core Set of Outcome Measures for Adults with Neurologic Conditions, 2018). Patient demonstrated increased fall risk noted by score of 44/56 on the [facility] Scale.  <45/56 = fall risk, <42/56 = predictive of recurrent falls, <40/56 = 100% fall  risk  >41 = independent, 21-40 = assistive device, 0-20 = wheelchair level  MDC 6.9 (4 pts 45-56, 5 pts 35-44, 7 pts 25-34) (ANPTA Core Set of Outcome Measures for Adults with Neurologic Conditions, 2018). Patient completed the Timed Up and Go test (TUG) in 21.71 seconds.  Geriatrics: need for further assessment of fall risk: >= 12 sec; Recurrent falls: > 15 sec; Vestibular Disorders fall risk: > 15 sec; Parkinson's Disease fall risk: > 16 sec ([email or web address], 2023). Progressing well with general LE strengthening. Continue POC.      OBJECTIVE IMPAIRMENTS: Abnormal gait, decreased balance, decreased coordination, decreased mobility, difficulty walking, decreased safety awareness, hypomobility, increased muscle spasms, impaired flexibility, impaired UE functional use, impaired vision/preception, postural dysfunction, and pain.   ACTIVITY LIMITATIONS: carrying, lifting, standing, squatting, stairs, transfers, bathing, reach over head, and hygiene/grooming  PARTICIPATION LIMITATIONS: meal prep, cleaning, laundry, driving, community activity, and yard work  PERSONAL FACTORS: Age, Time since onset of injury/illness/exacerbation, and 3+ comorbidities: Impaired vision in R>L eye, lumbar fusion, L complete rotator cuff tear and inability to flex arm  are also affecting patient's functional outcome.   REHAB POTENTIAL: Good  CLINICAL DECISION MAKING: Stable/uncomplicated  EVALUATION COMPLEXITY: Moderate  PLAN:  PT FREQUENCY: 2x/week  PT DURATION: 8 weeks  PLANNED INTERVENTIONS: Therapeutic exercises, Therapeutic activity, Neuromuscular re-education, Balance training, Gait training, Patient/Family education, Self Care, Joint mobilization, Stair training, Orthotic/Fit training, Aquatic Therapy, Cryotherapy, Moist heat, Manual therapy, and Re-evaluation  PLAN FOR NEXT SESSION:  10TH VISIT PROGRESS NOTE NEXT SESSION Posture, fwd/backward walking, staying closer to walker frame, dynamic standing balance  tasks, R LE SLS tasks?   Westley Foots, PT Westley Foots, PT, DPT, CBIS  09/04/2023, 2:04 PM

## 2023-09-08 ENCOUNTER — Ambulatory Visit: Payer: Medicare Other

## 2023-09-08 DIAGNOSIS — R262 Difficulty in walking, not elsewhere classified: Secondary | ICD-10-CM

## 2023-09-08 DIAGNOSIS — M6281 Muscle weakness (generalized): Secondary | ICD-10-CM

## 2023-09-08 DIAGNOSIS — R2681 Unsteadiness on feet: Secondary | ICD-10-CM | POA: Diagnosis not present

## 2023-09-08 DIAGNOSIS — R296 Repeated falls: Secondary | ICD-10-CM

## 2023-09-08 DIAGNOSIS — R2689 Other abnormalities of gait and mobility: Secondary | ICD-10-CM

## 2023-09-08 NOTE — Therapy (Signed)
OUTPATIENT PHYSICAL THERAPY NEURO TREATMENT- 10TH VISIT PROGRESS NOTE   Patient Name: Jay Tran MRN: [id number] DOB:07-23-1934, 89 y.o., male Today's Date: 09/08/2023   PCP: Dr. Windle Guard REFERRING PROVIDER: Dr. Windle Guard  Physical Therapy Progress Note   Dates of Reporting Period:07/28/23- 09/04/23  See Note below for Objective Data and Assessment of Progress/Goals.  Thank you for the referral of this patient. Westley Foots, PT, DPT, CBIS   END OF SESSION:  PT End of Session - 09/08/23 1336     Visit Number 11    Number of Visits 13    Date for PT Re-Evaluation 09/22/23    Authorization Type Medicare    Progress Note Due on Visit 10    PT Start Time 1320    PT Stop Time 1400    PT Time Calculation (min) 40 min    Equipment Utilized During Treatment Gait belt    Activity Tolerance Patient tolerated treatment well    Behavior During Therapy WFL for tasks assessed/performed              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 (HCC)    Hx: of after 2013 back surgery   GERD (gastroesophageal reflux disease)    Hearing aid worn    Hx: of right ear   Hyperlipemia    Hypertension    Lumbar herniated disc    Lumbar stenosis    Macular degeneration right eye dry   PONV (postoperative nausea and vomiting)    takes iv nausea meds    Seasonal allergies    Hx: of   Stroke [facility])    Past Surgical History:  Procedure Laterality Date   BACK SURGERY  2013, x 3 with dr Yetta Barre 2014, 2016 and 2019   x 4, takes back injections monthly   COLONOSCOPY     Hx: of   ENDARTERECTOMY Right 02/07/2020   Procedure: ENDARTERECTOMY CAROTID;  Surgeon: Nada Libman, MD;  Location: [facility] OR;  Service: Vascular;  Laterality: Right;   EYE SURGERY     bil catarcts 02/2016   HERNIA REPAIR  several yrs ago   inguinal   INCISION AND  DRAINAGE ABSCESS N/A 04/02/2023   Procedure: INCISION AND DRAINAGE,  LEFT GLUTEAL ABSCESS;  Surgeon: Sheliah Hatch De Blanch, MD;  Location: [facility] OR;  Service: General;  Laterality: N/A;   LUMBAR LAMINECTOMY  10/26/2012   Procedure: MICRODISCECTOMY LUMBAR LAMINECTOMY;  Surgeon: Eldred Manges, MD;  Location: [facility] OR;  Service: Orthopedics;  Laterality: Right;  Right L4-5 Microdiscectomy   LUMBAR WOUND DEBRIDEMENT N/A 09/15/2021   Procedure: revision of thoracic incision, Irrigation and debridement of battery pocket wound;  Surgeon: Tia Alert, MD;  Location: [facility] OR;  Service: Neurosurgery;  Laterality: N/A;   MAXIMUM ACCESS (MAS)POSTERIOR LUMBAR INTERBODY FUSION (PLIF) 1 LEVEL N/A 04/15/2015   Procedure: Maximum Access Surgery Posterior Lumbar Interbody Fusion Lumbar two-Lumbar three extension of hardware;  Surgeon: Tia Alert, MD;  Location: [facility] NEURO ORS;  Service: Neurosurgery;  Laterality: N/A;   PATCH ANGIOPLASTY Right 02/07/2020   Procedure: PATCH ANGIOPLASTY USING Livia Snellen BIOLOGIC PATCH;  Surgeon: Nada Libman, MD;  Location: [facility] OR;  Service: Vascular;  Laterality: Right;   ROTATOR CUFF REPAIR     right shoulder - 3 times   SPINAL CORD STIMULATOR INSERTION  08/11/2021   TONSILLECTOMY  as child   TRANSURETHRAL RESECTION OF BLADDER TUMOR N/A 02/24/2017   Procedure: TRANSURETHRAL RESECTION OF BLADDER TUMOR (TURBT);  Surgeon: Sebastian Ache, MD;  Location: [facility] ORS;  Service: Urology;  Laterality: N/A;   TRANSURETHRAL RESECTION OF PROSTATE N/A 11/20/2020   Procedure: TRANSURETHRAL RESECTION OF THE PROSTATE (TURP);  Surgeon: Sebastian Ache, MD;  Location: [facility];  Service: Urology;  Laterality: N/A;   Patient Active Problem List   Diagnosis Date Noted   Perianal abscess 04/02/2023   Hyponatremia 04/02/2023   Delirium 04/01/2023   Weakness 04/01/2023   Acute cystitis without hematuria 02/23/2023   Syncope and collapse 02/22/2023   AKI (acute kidney injury) (HCC)  02/22/2023   Unilateral primary osteoarthritis, right hip 03/30/2022   Prostatic hypertrophy 11/20/2020   Internal carotid artery stenosis 02/05/2020   Essential hypertension 02/05/2020   Benign prostatic hyperplasia with urinary retention 02/05/2020   GERD (gastroesophageal reflux disease) 02/05/2020   Chronic back pain 02/05/2020   Vision loss of right eye 02/04/2020   Wound drainage 08/20/2018   Chronic pain of both shoulders 02/22/2017   S/P lumbar spinal fusion 04/15/2015   HNP (herniated nucleus pulposus), lumbar 10/26/2012    Class: Diagnosis of    ONSET DATE: 03/31/23  REFERRING DIAG: R26.89 (ICD-10-CM) - Balance disorder   THERAPY DIAG:  Unsteadiness on feet  Difficulty in walking, not elsewhere classified  Muscle weakness (generalized)  Other abnormalities of gait and mobility  Repeated falls  Rationale for Evaluation and Treatment: Rehabilitation  SUBJECTIVE:                                                                                                                                                                                             SUBJECTIVE STATEMENT: No change. No falls.  PERTINENT HISTORY: Patient with a history of BPH w/ urinary retention, chronic pain s/p lumbar fusion w/ spinal cord stimulator (~2022), HTN, CAD, DVT, HLD, and CVA (~)2021  PAIN:  Are you having pain? No  PRECAUTIONS: Fall  PATIENT GOALS: improve walking   TODAY'S TREATMENT:  Scifit: manual level 5 for 10' UE and LE Standing partial range lumbar extensions with back against rail of parallel bars: 10x- pt educated to avoid pain Standing stepping fwd and bwd in place: 10x R and L, no HHA Standing taking larger steps forward with ipsilateral 1x UE support only: 10x R and L, pt has to shuffle L LE back by adjusting 3-5 times to bring it back to starting  position due to difficulty WB on R LE and limited by pain in lumbar spine Fwd step ups: 6" box: bil HHA: 10x R and L, one LOB due to patient not clearing R foot during stepping down backwards-required min A to regain balance Bouncing ball in front of him without HHA: 10x, pt unabel to catch it and difficulty with catching ball outside limited limits of stability. Calf stretch on slant portion of the parallel bar: 1' hold Gait training: waiting area to gym and gym to waiting area: cues on standing up tall, keeping walker closed, and taking larger steps.     PATIENT EDUCATION: Education details: continue HEP, VERY short household distance when using SPC Person educated: Patient Education method: Explanation Education comprehension: verbalized understanding and needs further education  HOME EXERCISE PROGRAM: Access Code: [email or web address] URL: [email or web address] Date: 08/08/2023 Prepared by: Merry Lofty  Exercises - Supine March  - 1 x daily - 7 x weekly - 3 sets - 10 reps - Sit to Stand  - 1 x daily - 7 x weekly - 2 sets - 5 reps - Seated March  - 1 x daily - 7 x weekly - 3 sets - 10 reps - Standing Tandem Balance with Counter Support  - 1 x daily - 7 x weekly - 3 sets - 3 reps - 30s hold - Narrow Stance with Unilateral Counter Support  - 1 x daily - 7 x weekly - 3 sets - 3 reps - 30s hold - Standing March with Counter Support  - 1 x daily - 7 x weekly - 3 sets - 10 reps - Backwards Walking  - 1 x daily - 7 x weekly - 3 sets - 10 reps  GOALS: Goals reviewed with patient? Yes  SHORT TERM GOALS: Target date: 08/25/2023   Patient will be able to perform standing forward reach to 5" to improve static balance. Baseline: can safey reach 2" (07/28/23), 5" Goal status: MET  2.  Pt will be I and compliant with HEP to self manage symtpoms Baseline: provided Goal status: MET  LONG TERM GOALS: Target date: 09/22/2023   Patient will demo BBS score of >48/56 to improve static  balance and reduce fall risk  Baseline: 41/56 (07/28/23) Goal status: INITIAL  2.  Pt will demo gait speed of >0.55 m/s with RW to improve functional gait and reduce fall risk. Baseline: 0.47 m/s; .56m/s Goal status: INITIAL  3.  Pt will demo TUG of <20 sec with RW to improve functional mobility and reduce fall risk  Baseline: 29 sec with RW Goal status: INITIAL  4.  Pt will be I and compliant with HEP to self manage symptoms. Baseline: TBD Goal status: INITIAL  ASSESSMENT: CLINICAL IMPRESSION: Today's session focused on continued work on dynamic balance with standing and improving strength. We also emphasized postural control with standing and walking. Patient has decreased ability to WB on R LE which was evident with stepping exercises. Lower back pain and endurance also are limiting factors with his potential in therapy. Still requires cueing for safety with transfers  to turn fully before sitting down and not approaching sitting sideways while keeping walker to the side.     OBJECTIVE IMPAIRMENTS: Abnormal gait, decreased balance, decreased coordination, decreased mobility, difficulty walking, decreased safety awareness, hypomobility, increased muscle spasms, impaired flexibility, impaired UE functional use, impaired vision/preception, postural dysfunction, and pain.   ACTIVITY LIMITATIONS: carrying, lifting, standing, squatting, stairs, transfers, bathing, reach over head, and hygiene/grooming  PARTICIPATION LIMITATIONS: meal prep, cleaning, laundry, driving, community activity, and yard work  PERSONAL FACTORS: Age, Time since onset of injury/illness/exacerbation, and 3+ comorbidities: Impaired vision in R>L eye, lumbar fusion, L complete rotator cuff tear and inability to flex arm  are also affecting patient's functional outcome.   REHAB POTENTIAL: Good  CLINICAL DECISION MAKING: Stable/uncomplicated  EVALUATION COMPLEXITY: Moderate  PLAN:  PT FREQUENCY: 2x/week  PT  DURATION: 8 weeks  PLANNED INTERVENTIONS: Therapeutic exercises, Therapeutic activity, Neuromuscular re-education, Balance training, Gait training, Patient/Family education, Self Care, Joint mobilization, Stair training, Orthotic/Fit training, Aquatic Therapy, Cryotherapy, Moist heat, Manual therapy, and Re-evaluation  PLAN FOR NEXT SESSION:  Posture, fwd/backward walking, staying closer to walker frame, dynamic standing balance tasks, R LE SLS tasks?   Ileana Ladd, PT  09/08/2023, 1:37 PM

## 2023-09-11 ENCOUNTER — Ambulatory Visit: Payer: Medicare Other

## 2023-09-11 DIAGNOSIS — R2689 Other abnormalities of gait and mobility: Secondary | ICD-10-CM

## 2023-09-11 DIAGNOSIS — R2681 Unsteadiness on feet: Secondary | ICD-10-CM

## 2023-09-11 DIAGNOSIS — M6281 Muscle weakness (generalized): Secondary | ICD-10-CM

## 2023-09-11 DIAGNOSIS — R262 Difficulty in walking, not elsewhere classified: Secondary | ICD-10-CM

## 2023-09-11 DIAGNOSIS — R296 Repeated falls: Secondary | ICD-10-CM

## 2023-09-11 NOTE — Therapy (Signed)
OUTPATIENT PHYSICAL THERAPY NEURO TREATMENT   Patient Name: Jay Tran MRN: [id number] DOB:10-04-1934, 89 y.o., male Today's Date: 09/11/2023   PCP: Dr. Windle Guard REFERRING PROVIDER: Dr. Windle Guard   END OF SESSION:  PT End of Session - 09/11/23 1318     Visit Number 12    Number of Visits 13    Date for PT Re-Evaluation 09/22/23    Authorization Type Medicare    Progress Note Due on Visit 10    PT Start Time 1315    PT Stop Time 1358    PT Time Calculation (min) 43 min    Equipment Utilized During Treatment Gait belt    Activity Tolerance Patient tolerated treatment well    Behavior During Therapy WFL for tasks assessed/performed              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 (HCC)    Hx: of after 2013 back surgery   GERD (gastroesophageal reflux disease)    Hearing aid worn    Hx: of right ear   Hyperlipemia    Hypertension    Lumbar herniated disc    Lumbar stenosis    Macular degeneration right eye dry   PONV (postoperative nausea and vomiting)    takes iv nausea meds    Seasonal allergies    Hx: of   Stroke [facility])    Past Surgical History:  Procedure Laterality Date   BACK SURGERY  2013, x 3 with dr Yetta Barre 2014, 2016 and 2019   x 4, takes back injections monthly   COLONOSCOPY     Hx: of   ENDARTERECTOMY Right 02/07/2020   Procedure: ENDARTERECTOMY CAROTID;  Surgeon: Nada Libman, MD;  Location: [facility] OR;  Service: Vascular;  Laterality: Right;   EYE SURGERY     bil catarcts 02/2016   HERNIA REPAIR  several yrs ago   inguinal   INCISION AND DRAINAGE ABSCESS N/A 04/02/2023   Procedure: INCISION AND DRAINAGE,  LEFT GLUTEAL ABSCESS;  Surgeon: Sheliah Hatch De Blanch, MD;  Location: [facility] OR;  Service: General;  Laterality: N/A;   LUMBAR LAMINECTOMY  10/26/2012   Procedure: MICRODISCECTOMY LUMBAR  LAMINECTOMY;  Surgeon: Eldred Manges, MD;  Location: [facility] OR;  Service: Orthopedics;  Laterality: Right;  Right L4-5 Microdiscectomy   LUMBAR WOUND DEBRIDEMENT N/A 09/15/2021   Procedure: revision of thoracic incision, Irrigation and debridement of battery pocket wound;  Surgeon: Tia Alert, MD;  Location: [facility] OR;  Service: Neurosurgery;  Laterality: N/A;   MAXIMUM ACCESS (MAS)POSTERIOR LUMBAR INTERBODY FUSION (PLIF) 1 LEVEL N/A 04/15/2015   Procedure: Maximum Access Surgery Posterior Lumbar Interbody Fusion Lumbar two-Lumbar three extension of hardware;  Surgeon: Tia Alert, MD;  Location: [facility] NEURO ORS;  Service: Neurosurgery;  Laterality: N/A;   PATCH ANGIOPLASTY Right 02/07/2020   Procedure: PATCH ANGIOPLASTY USING Livia Snellen BIOLOGIC PATCH;  Surgeon: Nada Libman, MD;  Location: [facility] OR;  Service: Vascular;  Laterality: Right;   ROTATOR CUFF REPAIR     right shoulder - 3 times   SPINAL CORD STIMULATOR INSERTION  08/11/2021   TONSILLECTOMY  as child   TRANSURETHRAL RESECTION OF BLADDER TUMOR N/A 02/24/2017   Procedure: TRANSURETHRAL RESECTION OF BLADDER TUMOR (TURBT);  Surgeon: Sebastian Ache, MD;  Location: [facility] ORS;  Service: Urology;  Laterality: N/A;  TRANSURETHRAL RESECTION OF PROSTATE N/A 11/20/2020   Procedure: TRANSURETHRAL RESECTION OF THE PROSTATE (TURP);  Surgeon: Sebastian Ache, MD;  Location: [facility];  Service: Urology;  Laterality: N/A;   Patient Active Problem List   Diagnosis Date Noted   Perianal abscess 04/02/2023   Hyponatremia 04/02/2023   Delirium 04/01/2023   Weakness 04/01/2023   Acute cystitis without hematuria 02/23/2023   Syncope and collapse 02/22/2023   AKI (acute kidney injury) (HCC) 02/22/2023   Unilateral primary osteoarthritis, right hip 03/30/2022   Prostatic hypertrophy 11/20/2020   Internal carotid artery stenosis 02/05/2020   Essential hypertension 02/05/2020   Benign prostatic hyperplasia with urinary retention 02/05/2020    GERD (gastroesophageal reflux disease) 02/05/2020   Chronic back pain 02/05/2020   Vision loss of right eye 02/04/2020   Wound drainage 08/20/2018   Chronic pain of both shoulders 02/22/2017   S/P lumbar spinal fusion 04/15/2015   HNP (herniated nucleus pulposus), lumbar 10/26/2012    Class: Diagnosis of    ONSET DATE: 03/31/23  REFERRING DIAG: R26.89 (ICD-10-CM) - Balance disorder   THERAPY DIAG:  Unsteadiness on feet  Difficulty in walking, not elsewhere classified  Muscle weakness (generalized)  Other abnormalities of gait and mobility  Repeated falls  Rationale for Evaluation and Treatment: Rehabilitation  SUBJECTIVE:                                                                                                                                                                                             SUBJECTIVE STATEMENT: Patient arrives to clinic using RW, denies falls/near falls. Was sore after last appt.   PERTINENT HISTORY: Patient with a history of BPH w/ urinary retention, chronic pain s/p lumbar fusion w/ spinal cord stimulator (~2022), HTN, CAD, DVT, HLD, and CVA (~)2021  PAIN:  Are you having pain? No  PRECAUTIONS: Fall  PATIENT GOALS: improve walking   TODAY'S TREATMENT:                                                                                                                              -  scifit hills level 3 x 10 mins B UE/LE  -blaze pods standing lateral weight shifts   -progressed to step over foam beam (general increase in instability)   -115'x2 rollator with verbal cues for upright posture + remaining close to walker   -CGA provided  -deterioration of gait pattern in last 30 ft   PATIENT EDUCATION: Education details: continue HEP, VERY short household distance when using rollator Person educated: Patient Education method: Explanation Education comprehension: verbalized understanding and needs further education  HOME EXERCISE  PROGRAM: Access Code: [email or web address] URL: [email or web address] Date: 08/08/2023 Prepared by: Merry Lofty  Exercises - Supine March  - 1 x daily - 7 x weekly - 3 sets - 10 reps - Sit to Stand  - 1 x daily - 7 x weekly - 2 sets - 5 reps - Seated March  - 1 x daily - 7 x weekly - 3 sets - 10 reps - Standing Tandem Balance with Counter Support  - 1 x daily - 7 x weekly - 3 sets - 3 reps - 30s hold - Narrow Stance with Unilateral Counter Support  - 1 x daily - 7 x weekly - 3 sets - 3 reps - 30s hold - Standing March with Counter Support  - 1 x daily - 7 x weekly - 3 sets - 10 reps - Backwards Walking  - 1 x daily - 7 x weekly - 3 sets - 10 reps  GOALS: Goals reviewed with patient? Yes  SHORT TERM GOALS: Target date: 08/25/2023   Patient will be able to perform standing forward reach to 5" to improve static balance. Baseline: can safey reach 2" (07/28/23), 5" Goal status: MET  2.  Pt will be I and compliant with HEP to self manage symtpoms Baseline: provided Goal status: MET  LONG TERM GOALS: Target date: 09/22/2023   Patient will demo BBS score of >48/56 to improve static balance and reduce fall risk  Baseline: 41/56 (07/28/23);  Goal status: INITIAL  2.  Pt will demo gait speed of >0.55 m/s with RW to improve functional gait and reduce fall risk. Baseline: 0.47 m/s; .39m/s Goal status: INITIAL  3.  Pt will demo TUG of <20 sec with RW to improve functional mobility and reduce fall risk  Baseline: 29 sec with RW Goal status: INITIAL  4.  Pt will be I and compliant with HEP to self manage symptoms. Baseline: TBD Goal status: INITIAL  ASSESSMENT: CLINICAL IMPRESSION: Patient seen for skilled PT session with emphasis on dynamic balance and gait retraining. Patient minimally responsive to multimodal cuing to increase foot clearance and step length usually resorting to decreased L step length and shuffling gait pattern. Somewhat resistant to education on safety while using  rollator at home. Continue POC.   OBJECTIVE IMPAIRMENTS: Abnormal gait, decreased balance, decreased coordination, decreased mobility, difficulty walking, decreased safety awareness, hypomobility, increased muscle spasms, impaired flexibility, impaired UE functional use, impaired vision/preception, postural dysfunction, and pain.   ACTIVITY LIMITATIONS: carrying, lifting, standing, squatting, stairs, transfers, bathing, reach over head, and hygiene/grooming  PARTICIPATION LIMITATIONS: meal prep, cleaning, laundry, driving, community activity, and yard work  PERSONAL FACTORS: Age, Time since onset of injury/illness/exacerbation, and 3+ comorbidities: Impaired vision in R>L eye, lumbar fusion, L complete rotator cuff tear and inability to flex arm  are also affecting patient's functional outcome.   REHAB POTENTIAL: Good  CLINICAL DECISION MAKING: Stable/uncomplicated  EVALUATION COMPLEXITY: Moderate  PLAN:  PT FREQUENCY: 2x/week  PT DURATION: 8 weeks  PLANNED INTERVENTIONS: Therapeutic  exercises, Therapeutic activity, Neuromuscular re-education, Balance training, Gait training, Patient/Family education, Self Care, Joint mobilization, Stair training, Orthotic/Fit training, Aquatic Therapy, Cryotherapy, Moist heat, Manual therapy, and Re-evaluation  PLAN FOR NEXT SESSION:  Discharge?   Westley Foots, PT Westley Foots, PT, DPT, CBIS   09/11/2023, 2:13 PM

## 2023-09-15 ENCOUNTER — Ambulatory Visit: Payer: Medicare Other

## 2023-09-18 ENCOUNTER — Ambulatory Visit: Payer: Medicare Other

## 2023-09-18 DIAGNOSIS — R2681 Unsteadiness on feet: Secondary | ICD-10-CM | POA: Diagnosis not present

## 2023-09-18 DIAGNOSIS — R2689 Other abnormalities of gait and mobility: Secondary | ICD-10-CM

## 2023-09-18 DIAGNOSIS — M6281 Muscle weakness (generalized): Secondary | ICD-10-CM

## 2023-09-18 DIAGNOSIS — R262 Difficulty in walking, not elsewhere classified: Secondary | ICD-10-CM

## 2023-09-18 NOTE — Therapy (Signed)
OUTPATIENT PHYSICAL THERAPY NEURO TREATMENT PHYSICAL THERAPY DISCHARGE SUMMARY  Visits from Start of Care: 13  Current functional level related to goals / functional outcomes: BBS   Remaining deficits: SLS, forward functional reaching, tandem stance   Education / Equipment: HEP   Patient agrees to discharge. Patient goals were met. Patient is being discharged due to maximized rehab potential.    Patient Name: Jay Tran MRN: [id number] DOB:1934-09-25, 89 y.o., male Today's Date: 09/18/2023   PCP: Dr. Windle Guard REFERRING PROVIDER: Dr. Windle Guard   END OF SESSION:  PT End of Session - 09/18/23 1457     Visit Number 13    Number of Visits 13    Date for PT Re-Evaluation 09/22/23    Authorization Type Medicare    Progress Note Due on Visit 10    PT Start Time 1445    PT Stop Time 1530    PT Time Calculation (min) 45 min    Equipment Utilized During Treatment Gait belt    Activity Tolerance Patient tolerated treatment well    Behavior During Therapy WFL for tasks assessed/performed              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 (HCC)    Hx: of after 2013 back surgery   GERD (gastroesophageal reflux disease)    Hearing aid worn    Hx: of right ear   Hyperlipemia    Hypertension    Lumbar herniated disc    Lumbar stenosis    Macular degeneration right eye dry   PONV (postoperative nausea and vomiting)    takes iv nausea meds    Seasonal allergies    Hx: of   Stroke [facility])    Past Surgical History:  Procedure Laterality Date   BACK SURGERY  2013, x 3 with dr Yetta Barre 2014, 2016 and 2019   x 4, takes back injections monthly   COLONOSCOPY     Hx: of   ENDARTERECTOMY Right 02/07/2020   Procedure: ENDARTERECTOMY CAROTID;  Surgeon: Nada Libman, MD;  Location: [facility] OR;  Service: Vascular;   Laterality: Right;   EYE SURGERY     bil catarcts 02/2016   HERNIA REPAIR  several yrs ago   inguinal   INCISION AND DRAINAGE ABSCESS N/A 04/02/2023   Procedure: INCISION AND DRAINAGE,  LEFT GLUTEAL ABSCESS;  Surgeon: Sheliah Hatch De Blanch, MD;  Location: [facility] OR;  Service: General;  Laterality: N/A;   LUMBAR LAMINECTOMY  10/26/2012   Procedure: MICRODISCECTOMY LUMBAR LAMINECTOMY;  Surgeon: Eldred Manges, MD;  Location: [facility] OR;  Service: Orthopedics;  Laterality: Right;  Right L4-5 Microdiscectomy   LUMBAR WOUND DEBRIDEMENT N/A 09/15/2021   Procedure: revision of thoracic incision, Irrigation and debridement of battery pocket wound;  Surgeon: Tia Alert, MD;  Location: [facility] OR;  Service: Neurosurgery;  Laterality: N/A;   MAXIMUM ACCESS (MAS)POSTERIOR LUMBAR INTERBODY FUSION (PLIF) 1 LEVEL N/A 04/15/2015   Procedure: Maximum Access Surgery Posterior Lumbar Interbody Fusion Lumbar two-Lumbar three extension of hardware;  Surgeon: Tia Alert, MD;  Location: [facility] NEURO ORS;  Service: Neurosurgery;  Laterality: N/A;   PATCH ANGIOPLASTY Right 02/07/2020   Procedure: PATCH ANGIOPLASTY USING Livia Snellen BIOLOGIC PATCH;  Surgeon: Nada Libman, MD;  Location: [facility] OR;  Service: Vascular;  Laterality: Right;   ROTATOR CUFF  REPAIR     right shoulder - 3 times   SPINAL CORD STIMULATOR INSERTION  08/11/2021   TONSILLECTOMY  as child   TRANSURETHRAL RESECTION OF BLADDER TUMOR N/A 02/24/2017   Procedure: TRANSURETHRAL RESECTION OF BLADDER TUMOR (TURBT);  Surgeon: Sebastian Ache, MD;  Location: [facility] ORS;  Service: Urology;  Laterality: N/A;   TRANSURETHRAL RESECTION OF PROSTATE N/A 11/20/2020   Procedure: TRANSURETHRAL RESECTION OF THE PROSTATE (TURP);  Surgeon: Sebastian Ache, MD;  Location: [facility];  Service: Urology;  Laterality: N/A;   Patient Active Problem List   Diagnosis Date Noted   Perianal abscess 04/02/2023   Hyponatremia 04/02/2023   Delirium 04/01/2023   Weakness 04/01/2023    Acute cystitis without hematuria 02/23/2023   Syncope and collapse 02/22/2023   AKI (acute kidney injury) (HCC) 02/22/2023   Unilateral primary osteoarthritis, right hip 03/30/2022   Prostatic hypertrophy 11/20/2020   Internal carotid artery stenosis 02/05/2020   Essential hypertension 02/05/2020   Benign prostatic hyperplasia with urinary retention 02/05/2020   GERD (gastroesophageal reflux disease) 02/05/2020   Chronic back pain 02/05/2020   Vision loss of right eye 02/04/2020   Wound drainage 08/20/2018   Chronic pain of both shoulders 02/22/2017   S/P lumbar spinal fusion 04/15/2015   HNP (herniated nucleus pulposus), lumbar 10/26/2012    Class: Diagnosis of    ONSET DATE: 03/31/23  REFERRING DIAG: R26.89 (ICD-10-CM) - Balance disorder   THERAPY DIAG:  Unsteadiness on feet  Difficulty in walking, not elsewhere classified  Muscle weakness (generalized)  Other abnormalities of gait and mobility  Rationale for Evaluation and Treatment: Rehabilitation  SUBJECTIVE:                                                                                                                                                                                             SUBJECTIVE STATEMENT: Patient arrives to clinic using RW, denies falls/near falls. Was sore after last appt.   PERTINENT HISTORY: Patient with a history of BPH w/ urinary retention, chronic pain s/p lumbar fusion w/ spinal cord stimulator (~2022), HTN, CAD, DVT, HLD, and CVA (~)2021  PAIN:  Are you having pain? No  PRECAUTIONS: Fall  PATIENT GOALS: improve walking   TODAY'S TREATMENT:                                                                                                                              -  scifit hills level 3 x 10 mins B UE/LE  -reassessment performed today. Pts progress discussed with patient and wife. Pt educated on being careful with reaching by making sure what ever he is reaching for is within his  arm's length to avoid any falls. Discussed using rolator for community ambulation and RW for in home mobility. Making sure he turns within walker and his feet are not outside of his walker when turning.   PATIENT EDUCATION: Education details: continue HEP, VERY short household distance when using rollator Person educated: Patient Education method: Explanation Education comprehension: verbalized understanding and needs further education  HOME EXERCISE PROGRAM: Access Code: [email or web address] URL: [email or web address] Date: 08/08/2023 Prepared by: Merry Lofty  Exercises - Supine March  - 1 x daily - 7 x weekly - 3 sets - 10 reps - Sit to Stand  - 1 x daily - 7 x weekly - 2 sets - 5 reps - Seated March  - 1 x daily - 7 x weekly - 3 sets - 10 reps - Standing Tandem Balance with Counter Support  - 1 x daily - 7 x weekly - 3 sets - 3 reps - 30s hold - Narrow Stance with Unilateral Counter Support  - 1 x daily - 7 x weekly - 3 sets - 3 reps - 30s hold - Standing March with Counter Support  - 1 x daily - 7 x weekly - 3 sets - 10 reps - Backwards Walking  - 1 x daily - 7 x weekly - 3 sets - 10 reps  GOALS: Goals reviewed with patient? Yes  SHORT TERM GOALS: Target date: 08/25/2023   Patient will be able to perform standing forward reach to 5" to improve static balance. Baseline: can safey reach 2" (07/28/23), 5" Goal status: MET  2.  Pt will be I and compliant with HEP to self manage symtpoms Baseline: provided Goal status: MET  LONG TERM GOALS: Target date: 09/22/2023   Patient will demo BBS score of >48/56 to improve static balance and reduce fall risk  Baseline: 41/56 (07/28/23); 47/56 (09/18/23) Goal status: Not met  2.  Pt will demo gait speed of >0.55 m/s with RW to improve functional gait and reduce fall risk. Baseline: 0.47 m/s; .57m/s; 0.56 m/s with rolator (09/18/23) Goal status: MET  3.  Pt will demo TUG of <20 sec with RW to improve functional mobility and reduce  fall risk  Baseline: 29 sec with RW; 18 sec with rolator (09/18/23) Goal status: Goal met  4.  Pt will be I and compliant with HEP to self manage symptoms. Baseline: TBD Goal status: goal met  ASSESSMENT: CLINICAL IMPRESSION: Patient has been seen for total of 13 sessions. Patient has met all of his STG and 3/4 LTGs in physical therapy. Currently patient is functionaly ambulating with rolling walker/Rolator safely. Patient has demo significant progress towards his gait speed, Berg balance scale and functional mobility with TUG. Patient has reached maximum potential in therapy at this point and will be discharged from skilled PT  OBJECTIVE IMPAIRMENTS: Abnormal gait, decreased balance, decreased coordination, decreased mobility, difficulty walking, decreased safety awareness, hypomobility, increased muscle spasms, impaired flexibility, impaired UE functional use, impaired vision/preception, postural dysfunction, and pain.   ACTIVITY LIMITATIONS: carrying, lifting, standing, squatting, stairs, transfers, bathing, reach over head, and hygiene/grooming  PARTICIPATION LIMITATIONS: meal prep, cleaning, laundry, driving, community activity, and yard work  PERSONAL FACTORS: Age, Time since onset of injury/illness/exacerbation, and 3+ comorbidities: Impaired vision in R>L eye, lumbar fusion, L  complete rotator cuff tear and inability to flex arm  are also affecting patient's functional outcome.   REHAB POTENTIAL: Good  CLINICAL DECISION MAKING: Stable/uncomplicated  EVALUATION COMPLEXITY: Moderate  PLAN: Discharge from PT  Ileana Ladd, PT    09/18/2023, 2:58 PM

## 2024-02-06 ENCOUNTER — Other Ambulatory Visit: Payer: Self-pay

## 2024-02-06 ENCOUNTER — Emergency Department (HOSPITAL_COMMUNITY): Payer: Medicare Other

## 2024-02-06 ENCOUNTER — Encounter (HOSPITAL_COMMUNITY): Payer: Self-pay | Admitting: Emergency Medicine

## 2024-02-06 ENCOUNTER — Emergency Department (HOSPITAL_COMMUNITY)
Admission: EM | Admit: 2024-02-06 | Discharge: 2024-02-07 | Disposition: A | Discharge status: Home / Self Care | Payer: Medicare Other | Attending: Emergency Medicine | Admitting: Emergency Medicine

## 2024-02-06 DIAGNOSIS — E860 Dehydration: Secondary | ICD-10-CM | POA: Insufficient documentation

## 2024-02-06 DIAGNOSIS — I10 Essential (primary) hypertension: Secondary | ICD-10-CM | POA: Insufficient documentation

## 2024-02-06 DIAGNOSIS — N3001 Acute cystitis with hematuria: Secondary | ICD-10-CM | POA: Diagnosis not present

## 2024-02-06 DIAGNOSIS — U071 COVID-19: Secondary | ICD-10-CM | POA: Diagnosis not present

## 2024-02-06 DIAGNOSIS — R531 Weakness: Secondary | ICD-10-CM | POA: Diagnosis present

## 2024-02-06 DIAGNOSIS — E871 Hypo-osmolality and hyponatremia: Secondary | ICD-10-CM | POA: Diagnosis not present

## 2024-02-06 DIAGNOSIS — Z7982 Long term (current) use of aspirin: Secondary | ICD-10-CM | POA: Diagnosis not present

## 2024-02-06 LAB — CBC WITH DIFFERENTIAL/PLATELET
Abs Immature Granulocytes: 0.05 10*3/uL (ref 0.00–0.07)
Basophils Absolute: 0.1 10*3/uL (ref 0.0–0.1)
Basophils Relative: 1 %
Eosinophils Absolute: 0.4 10*3/uL (ref 0.0–0.5)
Eosinophils Relative: 4 %
HCT: 35.2 % — ABNORMAL LOW (ref 39.0–52.0)
Hemoglobin: 12.1 g/dL — ABNORMAL LOW (ref 13.0–17.0)
Immature Granulocytes: 1 %
Lymphocytes Relative: 8 %
Lymphs Abs: 0.8 10*3/uL (ref 0.7–4.0)
MCH: 33.5 pg (ref 26.0–34.0)
MCHC: 34.4 g/dL (ref 30.0–36.0)
MCV: 97.5 fL (ref 80.0–100.0)
Monocytes Absolute: 1.5 10*3/uL — ABNORMAL HIGH (ref 0.1–1.0)
Monocytes Relative: 15 %
Neutro Abs: 7.4 10*3/uL (ref 1.7–7.7)
Neutrophils Relative %: 71 %
Platelets: 294 10*3/uL (ref 150–400)
RBC: 3.61 MIL/uL — ABNORMAL LOW (ref 4.22–5.81)
RDW: 12 % (ref 11.5–15.5)
WBC: 10.2 10*3/uL (ref 4.0–10.5)
nRBC: 0 % (ref 0.0–0.2)

## 2024-02-06 LAB — COMPREHENSIVE METABOLIC PANEL
ALT: <5 U/L (ref 0–44)
AST: 21 U/L (ref 15–41)
Albumin: 3.9 g/dL (ref 3.5–5.0)
Alkaline Phosphatase: 49 U/L (ref 38–126)
Anion gap: 10 (ref 5–15)
BUN: 11 mg/dL (ref 8–23)
CO2: 25 mmol/L (ref 22–32)
Calcium: 9.1 mg/dL (ref 8.9–10.3)
Chloride: 94 mmol/L — ABNORMAL LOW (ref 98–111)
Creatinine, Ser: 0.74 mg/dL (ref 0.61–1.24)
GFR, Estimated: >60 mL/min (ref >60)
Glucose, Bld: 143 mg/dL — ABNORMAL HIGH (ref 70–99)
Potassium: 3.7 mmol/L (ref 3.5–5.1)
Sodium: 129 mmol/L — ABNORMAL LOW (ref 135–145)
Total Bilirubin: 0.7 mg/dL (ref 0.0–1.2)
Total Protein: 7.1 g/dL (ref 6.5–8.1)

## 2024-02-06 LAB — URINALYSIS, W/ REFLEX TO CULTURE (INFECTION SUSPECTED)
Bilirubin Urine: NEGATIVE
Glucose, UA: NEGATIVE mg/dL
Ketones, ur: NEGATIVE mg/dL
Nitrite: NEGATIVE
Protein, ur: 30 mg/dL — AB
Specific Gravity, Urine: 1.013 (ref 1.005–1.030)
WBC, UA: >50 WBC/hpf (ref 0–5)
pH: 6 (ref 5.0–8.0)

## 2024-02-06 LAB — RESP PANEL BY RT-PCR (RSV, FLU A&B, COVID)  RVPGX2
Influenza A by PCR: NEGATIVE
Influenza B by PCR: NEGATIVE
Resp Syncytial Virus by PCR: NEGATIVE
SARS Coronavirus 2 by RT PCR: POSITIVE — AB

## 2024-02-06 MED ORDER — SODIUM CHLORIDE 0.9 % IV BOLUS
1000.0000 mL | Freq: Once | INTRAVENOUS | Status: AC
  Administered 2024-02-06: 1000 mL via INTRAVENOUS

## 2024-02-06 MED ORDER — CEPHALEXIN 500 MG PO CAPS
500.0000 mg | ORAL_CAPSULE | Freq: Once | ORAL | Status: AC
  Administered 2024-02-06: 500 mg via ORAL
  Filled 2024-02-06: qty 1

## 2024-02-06 NOTE — ED Provider Notes (Signed)
 [place] EMERGENCY DEPARTMENT AT [facility] Provider Note   CSN: [id number] Arrival date & time: 02/06/24  2043     History {Add pertinent medical, surgical, social history, OB history to HPI:1} Chief Complaint  Patient presents with   Weakness    Jay Tran is a 89 y.o. male.  Pt is a   The history is provided by the patient, the spouse and medical records.  Weakness      Home Medications Prior to Admission medications   Medication Sig Start Date End Date Taking? Authorizing Provider  acetaminophen (TYLENOL) 500 MG tablet Take 1-2 tablets (500-1,000 mg total) by mouth every 6 (six) hours as needed for mild pain, headache or moderate pain. Patient taking differently: Take 500-1,000 mg by mouth in the morning and at bedtime. 02/26/23   Hongalgi, Maximino Greenland, MD  aspirin EC 81 MG tablet Take 81 mg by mouth in the morning. Swallow whole.    [provider]  atorvastatin (LIPITOR) 40 MG tablet TAKE 1 TABLET BY MOUTH ONCE DAILY AT  6  PM Patient taking differently: Take 40 mg by mouth at bedtime. 03/16/21   Ihor Austin, NP  cyanocobalamin (VITAMIN B12) 1000 MCG tablet Take 1 tablet (1,000 mcg total) by mouth daily. Patient not taking: Reported on 04/14/2023 04/06/23   Glade Lloyd, MD  divalproex (DEPAKOTE) 125 MG DR tablet Take 125 mg by mouth 3 (three) times daily.    [provider]  finasteride (PROSCAR) 5 MG tablet Take 5 mg by mouth at bedtime.     [provider]  lidocaine (LIDODERM) 5 % Place 1 patch onto the skin daily. Remove & Discard patch within 12 hours or as directed by MD 04/06/23   Glade Lloyd, MD  LORazepam (ATIVAN) 1 MG tablet Take 1 mg by mouth every 8 (eight) hours.    [provider]  Multiple Vitamins-Minerals (PRESERVISION AREDS 2 PO) Take 1 capsule by mouth 2 (two) times daily.     [provider]  Omega-3 Fatty Acids (FISH OIL) 1200 MG CAPS Take 1,200 mg by mouth daily with breakfast.      [provider]  omeprazole (PRILOSEC OTC) 20 MG tablet Take 40 mg by mouth every evening.    [provider]  ondansetron (ZOFRAN) 8 MG tablet Take 8 mg by mouth every 8 (eight) hours as needed for nausea or vomiting.    [provider]  polyethylene glycol (MIRALAX / GLYCOLAX) 17 g packet Take 17 g by mouth daily as needed for mild constipation.    [provider]  SYSTANE 0.4-0.3 % SOLN Place 1 drop into both eyes 3 (three) times daily as needed (for dryness). Patient not taking: Reported on 04/14/2023    [provider]  Tamsulosin HCl (FLOMAX) 0.4 MG CAPS Take 0.4 mg by mouth at bedtime.     [provider]  tiZANidine (ZANAFLEX) 4 MG tablet Take 1 tablet (4 mg total) by mouth every 8 (eight) hours as needed for muscle spasms. Patient taking differently: Take 4-8 mg by mouth at bedtime. 08/11/18   Arman Bogus, MD  Turmeric (QC TUMERIC COMPLEX PO) Take 500 mg by mouth daily with breakfast. Patient not taking: Reported on 04/14/2023    [provider]      Allergies    Codeine, Other, and Tramadol    Review of Systems   Review of Systems  Neurological:  Positive for weakness.    Physical Exam Updated Vital Signs  BP (!) 154/81 (BP Location: Right Arm)   Pulse 84   Temp 98.5 F (36.9 C) (Oral)   Resp (!) 21   Ht 5\' 10"  (1.778 m)   Wt 86.2 kg   SpO2 96%   BMI 27.26 kg/m  Physical Exam  ED Results / Procedures / Treatments   Labs (all labs ordered are listed, but only abnormal results are displayed) Labs Reviewed  CBC WITH DIFFERENTIAL/PLATELET - Abnormal; Notable for the following components:      Result Value   RBC 3.61 (*)    Hemoglobin 12.1 (*)    HCT 35.2 (*)    Monocytes Absolute 1.5 (*)    All other components within normal limits  COMPREHENSIVE METABOLIC PANEL - Abnormal; Notable for the following components:   Sodium 129 (*)    Chloride 94 (*)    Glucose, Bld 143 (*)    All other components  within normal limits  URINALYSIS, W/ REFLEX TO CULTURE (INFECTION SUSPECTED) - Abnormal; Notable for the following components:   APPearance CLOUDY (*)    Hgb urine dipstick SMALL (*)    Protein, ur 30 (*)    Leukocytes,Ua SMALL (*)    Bacteria, UA MANY (*)    All other components within normal limits  RESP PANEL BY RT-PCR (RSV, FLU A&B, COVID)  [id number]  URINE CULTURE    EKG None  Radiology DG Chest Port 1 View Result Date: 02/06/2024 CLINICAL DATA:  Cough EXAM: PORTABLE CHEST 1 VIEW COMPARISON:  03/31/2023 FINDINGS: Lungs are well expanded, symmetric, and clear. No pneumothorax or pleural effusion. Stable mild cardiomegaly. Pulmonary vascularity is normal. Osseous structures are age-appropriate. No acute bone abnormality. IMPRESSION: 1. No active disease. 2. Mild cardiomegaly. Electronically Signed   By: Helyn Numbers M.D.   On: 02/06/2024 22:20    Procedures Procedures  {Document cardiac monitor, telemetry assessment procedure when appropriate:1}  Medications Ordered in ED Medications  sodium chloride 0.9 % bolus 1,000 mL (has no administration in time range)    ED Course/ Medical Decision Making/ A&P   {   Click here for ABCD2, HEART and other calculatorsREFRESH Note before signing :1}                              Medical Decision Making Amount and/or Complexity of Data Reviewed Labs: ordered. Radiology: ordered.   ***  {Document critical care time when appropriate:1} {Document review of labs and clinical decision tools ie heart score, Chads2Vasc2 etc:1}  {Document your independent review of radiology images, and any outside records:1} {Document your discussion with family members, caretakers, and with consultants:1} {Document social determinants of health affecting pt's care:1} {Document your decision making why or why not admission, treatments were needed:1} Final Clinical Impression(s) / ED Diagnoses Final diagnoses:  None    Rx / DC Orders ED Discharge  Orders     None

## 2024-02-06 NOTE — ED Notes (Signed)
 Pt ambulated in hallway with walker without difficulty. Denies dizziness or lightheadedness. States he is ready to go home. MD aware.

## 2024-02-06 NOTE — ED Triage Notes (Addendum)
 Pt arrives via EMS from home with weakness. Pt states he was attempting to ambulate to bathroom when legs gave out. Pt denies LOC. Denies any CP/SHOB. Pt states worsening productive cough.

## 2024-02-07 MED ORDER — CEPHALEXIN 500 MG PO CAPS: 500.0000 mg | ORAL_CAPSULE | Freq: Four times a day (QID) | ORAL | 0 refills | Status: DC

## 2024-02-07 MED ORDER — PAXLOVID (300/100) 20 X 150 MG & 10 X 100MG PO TBPK: 3.0000 | ORAL_TABLET | Freq: Two times a day (BID) | ORAL | 0 refills | Status: DC

## 2024-02-07 MED ORDER — PAXLOVID (300/100) 20 X 150 MG & 10 X 100MG PO TBPK: 3.0000 | ORAL_TABLET | Freq: Two times a day (BID) | ORAL | 0 refills | Status: AC

## 2024-02-07 NOTE — Discharge Instructions (Addendum)
 Return to the emergency room if he starts having confusion, worsening weakness, any trouble breathing, refusing to eat or having vomiting.  Does appear to have COVID today and a urinary tract infection.

## 2024-02-11 LAB — URINE CULTURE: Culture: >=100000 — AB

## 2024-02-12 ENCOUNTER — Telehealth (HOSPITAL_BASED_OUTPATIENT_CLINIC_OR_DEPARTMENT_OTHER): Payer: Self-pay | Admitting: *Deleted

## 2024-02-12 NOTE — Telephone Encounter (Signed)
 Post ED Visit - Positive Culture Follow-up  Culture report reviewed by antimicrobial stewardship pharmacist: [facility] []  Enzo Bi, Pharm.D. []  Celedonio Miyamoto, Pharm.D., BCPS AQ-ID []  Garvin Fila, Pharm.D., BCPS []  Georgina Pillion, Pharm.D., BCPS []  [place], [address].D., BCPS, AAHIVP []  Estella Husk, Pharm.D., BCPS, AAHIVP []  Lysle Pearl, PharmD, BCPS []  Phillips Climes, PharmD, BCPS []  Agapito Games, PharmD, BCPS []  Verlan Friends, PharmD []  Mervyn Gay, PharmD, BCPS []  Vinnie Level, PharmD  [facility] [x] Sharin Mons PharmD []  Greer Pickerel, PharmD []  Adalberto Cole, PharmD []  Perlie Gold, Rph []  Lonell Face) Jean Rosenthal, PharmD []  Earl Many, PharmD []  Junita Push, PharmD []  Dorna Leitz, PharmD []  Terrilee Files, PharmD []  Lynann Beaver, PharmD []  Keturah Barre, PharmD []  Loralee Pacas, PharmD []  Bernadene Person, PharmD   Positive urine culture Treated with Cephalexin, organism sensitive to the same and no further patient follow-up is required at this time.  Jay Tran 02/12/2024, 10:59 AM

## 2024-02-15 ENCOUNTER — Encounter (INDEPENDENT_AMBULATORY_CARE_PROVIDER_SITE_OTHER): Payer: Self-pay

## 2024-02-15 ENCOUNTER — Encounter (INDEPENDENT_AMBULATORY_CARE_PROVIDER_SITE_OTHER): Payer: Medicare Other | Admitting: Ophthalmology

## 2024-02-29 ENCOUNTER — Encounter (INDEPENDENT_AMBULATORY_CARE_PROVIDER_SITE_OTHER): Payer: Medicare Other | Admitting: Ophthalmology

## 2024-02-29 DIAGNOSIS — I10 Essential (primary) hypertension: Secondary | ICD-10-CM

## 2024-02-29 DIAGNOSIS — H353231 Exudative age-related macular degeneration, bilateral, with active choroidal neovascularization: Secondary | ICD-10-CM | POA: Diagnosis not present

## 2024-02-29 DIAGNOSIS — H43813 Vitreous degeneration, bilateral: Secondary | ICD-10-CM

## 2024-02-29 DIAGNOSIS — H3411 Central retinal artery occlusion, right eye: Secondary | ICD-10-CM | POA: Diagnosis not present

## 2024-02-29 DIAGNOSIS — H35033 Hypertensive retinopathy, bilateral: Secondary | ICD-10-CM

## 2024-03-13 ENCOUNTER — Other Ambulatory Visit: Payer: Self-pay

## 2024-03-13 DIAGNOSIS — I6523 Occlusion and stenosis of bilateral carotid arteries: Secondary | ICD-10-CM

## 2024-03-25 ENCOUNTER — Ambulatory Visit (HOSPITAL_COMMUNITY)
Admission: RE | Admit: 2024-03-25 | Discharge: 2024-03-25 | Disposition: A | Discharge status: Home / Self Care | Payer: Medicare Other | Source: Ambulatory Visit | Attending: Surgery | Admitting: Surgery

## 2024-03-25 ENCOUNTER — Encounter: Payer: Self-pay | Admitting: Physician Assistant

## 2024-03-25 ENCOUNTER — Ambulatory Visit (INDEPENDENT_AMBULATORY_CARE_PROVIDER_SITE_OTHER): Payer: Medicare Other | Admitting: Physician Assistant

## 2024-03-25 VITALS — BP 156/73 | HR 64 | Temp 98.4°F | Ht 70.0 in | Wt 191.4 lb

## 2024-03-25 DIAGNOSIS — I6523 Occlusion and stenosis of bilateral carotid arteries: Secondary | ICD-10-CM | POA: Diagnosis present

## 2024-03-25 NOTE — Progress Notes (Signed)
 Office Note   History of Present Illness   Jay Tran is a 89 y.o. ([birth year]) male who presents for surveillance of carotid artery stenosis.  He has a history of right carotid endarterectomy on 02/07/2020 by Dr. Myra Gianotti.  This was done for symptomatic carotid stenosis with a right retinal artery occlusion.  He also has a history of macular degeneration which has caused him worsening vision bilaterally.  He returns today for follow-up. He denies any recent stroke like symptoms such as slurred speech, facial droop, sudden visual changes, or sudden weakness/numbness.  He did recently go to the ED in February for generalized weakness and a cough.  He was found to be acutely dehydrated and was diagnosed with COVID. He is completely recovered at today's visit.    Current Outpatient Medications  Medication Sig Dispense Refill   acetaminophen (TYLENOL) 500 MG tablet Take 1-2 tablets (500-1,000 mg total) by mouth every 6 (six) hours as needed for mild pain, headache or moderate pain. (Patient taking differently: Take 500-1,000 mg by mouth in the morning and at bedtime.)     aspirin EC 81 MG tablet Take 81 mg by mouth in the morning. Swallow whole.     cephALEXin (KEFLEX) 500 MG capsule Take 1 capsule (500 mg total) by mouth 4 (four) times daily. (Patient taking differently: Take 500 mg by mouth at bedtime.) 20 capsule 0   cyanocobalamin (VITAMIN B12) 1000 MCG tablet Take 1 tablet (1,000 mcg total) by mouth daily. 30 tablet 0   finasteride (PROSCAR) 5 MG tablet Take 5 mg by mouth at bedtime.      lidocaine (LIDODERM) 5 % Place 1 patch onto the skin daily. Remove & Discard patch within 12 hours or as directed by MD 30 patch 0   Multiple Vitamins-Minerals (PRESERVISION AREDS 2 PO) Take 1 capsule by mouth 2 (two) times daily.      Omega-3 Fatty Acids (FISH OIL) 1200 MG CAPS Take 1,200 mg by mouth daily with breakfast.      ondansetron (ZOFRAN) 8 MG tablet Take 8 mg by mouth every 8 (eight) hours as  needed for nausea or vomiting.     polyethylene glycol (MIRALAX / GLYCOLAX) 17 g packet Take 17 g by mouth daily as needed for mild constipation.     SYSTANE 0.4-0.3 % SOLN Place 1 drop into both eyes 3 (three) times daily as needed (for dryness).     Tamsulosin HCl (FLOMAX) 0.4 MG CAPS Take 0.4 mg by mouth at bedtime.      tiZANidine (ZANAFLEX) 4 MG tablet Take 1 tablet (4 mg total) by mouth every 8 (eight) hours as needed for muscle spasms. (Patient taking differently: Take 4-8 mg by mouth at bedtime.) 60 tablet 1   Turmeric (QC TUMERIC COMPLEX PO) Take 500 mg by mouth daily with breakfast.     omeprazole (PRILOSEC OTC) 20 MG tablet Take 40 mg by mouth every evening.     No current facility-administered medications for this visit.    REVIEW OF SYSTEMS (negative unless checked):   Cardiac:  []  Chest pain or chest pressure? []  Shortness of breath upon activity? []  Shortness of breath when lying flat? []  Irregular heart rhythm?  Vascular:  []  Pain in calf, thigh, or hip brought on by walking? []  Pain in feet at night that wakes you up from your sleep? []  Blood clot in your veins? []  Leg swelling?  Pulmonary:  []  Oxygen at home? []  Productive cough? []  Wheezing?  Neurologic:  []   Sudden weakness in arms or legs? []  Sudden numbness in arms or legs? []  Sudden onset of difficult speaking or slurred speech? []  Temporary loss of vision in one eye? []  Problems with dizziness?  Gastrointestinal:  []  Blood in stool? []  Vomited blood?  Genitourinary:  []  Burning when urinating? []  Blood in urine?  Psychiatric:  []  Major depression  Hematologic:  []  Bleeding problems? []  Problems with blood clotting?  Dermatologic:  []  Rashes or ulcers?  Constitutional:  []  Fever or chills?  Ear/Nose/Throat:  []  Change in hearing? []  Nose bleeds? []  Sore throat?  Musculoskeletal:  []  Back pain? []  Joint pain? []  Muscle pain?   Physical Examination   Vitals:   03/25/24 1429   TempSrc: Temporal  Weight: 191 lb 6.4 oz (86.8 kg)  Height: 5\' 10"  (1.778 m)   Body mass index is 27.46 kg/m.  General:  WDWN in NAD; vital signs documented above Gait: Not observed HENT: WNL, normocephalic Pulmonary: normal non-labored breathing , without rales, rhonchi,  wheezing Cardiac: regular Abdomen: soft, NT, no masses Skin: without rashes Vascular Exam/Pulses: palpable radial pulses bilaterally Extremities: without ischemic changes, without gangrene , without cellulitis; without open wounds;  Musculoskeletal: no muscle wasting or atrophy  Neurologic: A&O X 3;  No focal weakness or paresthesias are detected Psychiatric:  The pt has Normal affect.  Non-Invasive Vascular Imaging   Bilateral Carotid Duplex (03/25/2024):  R ICA stenosis:  1-39% R VA:  patent and antegrade L ICA stenosis:  1-39% L VA:  patent and antegrade   Medical Decision Making   Jay Tran is a 89 y.o. male who presents for surveillance of carotid artery stenosis  Based on the patient's vascular studies, his carotid artery stenosis is stable bilaterally at 1-39% He denies any strokelike symptoms such as slurred speech, facial droop, sudden visual changes, or sudden weakness/numbness.  He is neurologically at baseline at today's visit. He has palpable and equal radial pulses on exam He will continue his daily aspirin and statin and follow up with our office in 1 year with repeat carotid duplex   Jay Dubonnet PA-C [facility] Office: [phone]  Clinic MD: Myra Gianotti

## 2024-07-17 ENCOUNTER — Ambulatory Visit (INDEPENDENT_AMBULATORY_CARE_PROVIDER_SITE_OTHER): Admitting: Orthopaedic Surgery

## 2024-07-17 ENCOUNTER — Other Ambulatory Visit (INDEPENDENT_AMBULATORY_CARE_PROVIDER_SITE_OTHER): Payer: Self-pay

## 2024-07-17 DIAGNOSIS — M25552 Pain in left hip: Secondary | ICD-10-CM

## 2024-07-17 NOTE — Progress Notes (Signed)
 The patient is an 89 year old gentleman that seeing today for what is described as left hip pain.  He does ambulate with a rolling walker.  We have seen him for his right hip before with known arthritis in the right hip.  He says it hurts him every morning when he first gets up but he actually points to the lower aspect of his lumbar spine as a source of his pain.  He denies any groin pain on the left side or any lateral left hip pain.  He has had 5 lumbar spine surgeries and even has a spinal cord stimulator.  This was done by one of my neurosurgery colleagues in town.  On exam I can easily put both hips the range of motion.  The right hip is much more stiff with some arthritic changes but the left hip moves smoothly and fluidly.  His pain is in the lower lumbar spine just to the left side in the paraspinal muscles in the facet joints.  It does not appear to be SI joint related but I think is more facet joint related of the spine.  An AP pelvis and x-rays of the hip shows no acute findings.  There is more significant arthritis of the right hip and mild arthritis of the left hip.  I gave him reassurance that this is not a hip issue.  He was requesting a steroid injection but this is something that needs to be done under fluoroscopy in the facet joints and would really need to be seen by his neurosurgeon first given the extensive surgery he has had in the past.

## 2024-09-17 NOTE — Telephone Encounter (Signed)
 error

## 2024-10-21 ENCOUNTER — Encounter: Payer: Self-pay | Admitting: Radiology

## 2024-11-28 ENCOUNTER — Encounter (INDEPENDENT_AMBULATORY_CARE_PROVIDER_SITE_OTHER): Admitting: Ophthalmology

## 2024-11-28 DIAGNOSIS — H353231 Exudative age-related macular degeneration, bilateral, with active choroidal neovascularization: Secondary | ICD-10-CM

## 2024-11-28 DIAGNOSIS — I10 Essential (primary) hypertension: Secondary | ICD-10-CM

## 2024-11-28 DIAGNOSIS — H35033 Hypertensive retinopathy, bilateral: Secondary | ICD-10-CM

## 2024-11-28 DIAGNOSIS — H43813 Vitreous degeneration, bilateral: Secondary | ICD-10-CM

## 2024-11-28 DIAGNOSIS — H3411 Central retinal artery occlusion, right eye: Secondary | ICD-10-CM | POA: Diagnosis not present

## 2024-12-02 ENCOUNTER — Encounter: Admitting: Physician Assistant

## 2024-12-16 ENCOUNTER — Encounter: Payer: Self-pay | Admitting: Physician Assistant

## 2024-12-16 ENCOUNTER — Other Ambulatory Visit: Payer: Self-pay | Admitting: Radiology

## 2024-12-16 ENCOUNTER — Ambulatory Visit: Admitting: Physician Assistant

## 2024-12-16 ENCOUNTER — Other Ambulatory Visit (INDEPENDENT_AMBULATORY_CARE_PROVIDER_SITE_OTHER): Payer: Self-pay

## 2024-12-16 DIAGNOSIS — G8929 Other chronic pain: Secondary | ICD-10-CM | POA: Diagnosis not present

## 2024-12-16 DIAGNOSIS — M79672 Pain in left foot: Secondary | ICD-10-CM | POA: Diagnosis not present

## 2024-12-16 NOTE — Progress Notes (Signed)
 HPI: [name] comes in today for left heel pain.  Heel pains been ongoing for the past 2 months no known injury.  He has had 2 heel injections by his primary care physician which gave him minimal relief for just a few hours.  The pain only occurs at 3 AM at night and awakens him.  He states that the pain feels like someone is driving a nail into his heel.  He has had no injury.  No pain with walking.  He does ambulate with a rollator and has some gait and balance issues.  He has had multiple back surgeries and has a spinal stimulator implanted.  He denies any radicular symptoms down the left leg at this point in time.  He does report to Dr. Delice getting an injection in his heel years ago that was helpful.  Review of systems: See HPI otherwise negative  Physical exam: General well-developed well-nourished male in no acute distress.  Ambulates with a slow almost shuffling antalgic gait.  Bilateral feet dorsal pedal pulses are 2+ and equal symmetric no rashes skin lesions or ulcerations.  Pes planovalgus left foot.  Nontender over the posterior tibial tendons bilaterally.  5 out of 5 strength with inversion eversion against resistance.  Full dorsiflexion plantarflexion bilateral ankles.  Tenderness left foot only medial tubercle of the calcaneus.  Nontender over the Achilles bilaterally.  Achilles intact bilaterally.  Tight gastrocs bilaterally.  Radiographs: 2 views left heel: No acute fractures.  Arthrosclerosis noted.  Heel spur present.  No bony abnormalities otherwise.  Impression: Left heel pain Left pes planovalgus  Plan: Recommend inserts for both shoes with a medial hindfoot wedge.  These should be pliable.  Change shoes daily.  Send him for physical therapy here at our office for gastrocsoleus stretching and modalities to the left heel.  Discussed with him gastrocsoleus stretching that he can begin getting on his own at home.  Would not recommend MRI at this point in time but he may require an  MRI if pain persist.  He will have to undergo some preparation for his stimulator prior to having an MRI and his wife states that the battery and the remote longer have to be fully charged in order to undergo MRI.  He will follow-up with us  in 4 weeks see how he is doing overall.  Questions were encouraged and answered

## 2024-12-25 ENCOUNTER — Encounter: Payer: Self-pay | Admitting: Rehabilitative and Restorative Service Providers"

## 2024-12-25 ENCOUNTER — Ambulatory Visit (INDEPENDENT_AMBULATORY_CARE_PROVIDER_SITE_OTHER): Admitting: Rehabilitative and Restorative Service Providers"

## 2024-12-25 DIAGNOSIS — R262 Difficulty in walking, not elsewhere classified: Secondary | ICD-10-CM

## 2024-12-25 DIAGNOSIS — R293 Abnormal posture: Secondary | ICD-10-CM | POA: Diagnosis not present

## 2024-12-25 DIAGNOSIS — M79672 Pain in left foot: Secondary | ICD-10-CM

## 2024-12-25 DIAGNOSIS — M6281 Muscle weakness (generalized): Secondary | ICD-10-CM | POA: Diagnosis not present

## 2024-12-25 NOTE — Therapy (Signed)
 " OUTPATIENT PHYSICAL THERAPY LOWER EXTREMITY EVALUATION   Patient Name: Jay Tran MRN: [id number] DOB:[birth year], 89 y.o., male Today's Date: 12/25/2024  END OF SESSION:  PT End of Session - 12/25/24 1650     Visit Number 1    Number of Visits 16    Date for Recertification  02/19/25    Authorization Type Medicare    Progress Note Due on Visit 10    PT Start Time 1258    PT Stop Time 1345    PT Time Calculation (min) 47 min    Activity Tolerance Patient tolerated treatment well;No increased pain;Patient limited by pain    Behavior During Therapy [facility] for tasks assessed/performed          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 (HCC)    Hx: of after 2013 back surgery   GERD (gastroesophageal reflux disease)    Hearing aid worn    Hx: of right ear   Hyperlipemia    Hypertension    Lumbar herniated disc    Lumbar stenosis    Macular degeneration right eye dry   PONV (postoperative nausea and vomiting)    takes iv nausea meds    Seasonal allergies    Hx: of   Stroke [facility])    Past Surgical History:  Procedure Laterality Date   BACK SURGERY  2013, x 3 with dr joshua 2014, 2016 and 2019   x 4, takes back injections monthly   COLONOSCOPY     Hx: of   ENDARTERECTOMY Right 02/07/2020   Procedure: ENDARTERECTOMY CAROTID;  Surgeon: Serene Gaile ORN, MD;  Location: [facility] OR;  Service: Vascular;  Laterality: Right;   EYE SURGERY     bil catarcts 02/2016   HERNIA REPAIR  several yrs ago   inguinal   INCISION AND DRAINAGE ABSCESS N/A 04/02/2023   Procedure: INCISION AND DRAINAGE,  LEFT GLUTEAL ABSCESS;  Surgeon: Stevie Herlene Righter, MD;  Location: [facility] OR;  Service: General;  Laterality: N/A;   LUMBAR LAMINECTOMY  10/26/2012   Procedure: MICRODISCECTOMY LUMBAR LAMINECTOMY;  Surgeon: Oneil JAYSON Herald, MD;  Location: [facility] OR;  Service:  Orthopedics;  Laterality: Right;  Right L4-5 Microdiscectomy   LUMBAR WOUND DEBRIDEMENT N/A 09/15/2021   Procedure: revision of thoracic incision, Irrigation and debridement of battery pocket wound;  Surgeon: Joshua Alm RAMAN, MD;  Location: [facility] OR;  Service: Neurosurgery;  Laterality: N/A;   MAXIMUM ACCESS (MAS)POSTERIOR LUMBAR INTERBODY FUSION (PLIF) 1 LEVEL N/A 04/15/2015   Procedure: Maximum Access Surgery Posterior Lumbar Interbody Fusion Lumbar two-Lumbar three extension of hardware;  Surgeon: Alm RAMAN Joshua, MD;  Location: [facility] OR[facility];  Service: Neurosurgery;  Laterality: N/A;   PATCH ANGIOPLASTY Right 02/07/2020   Procedure: PATCH ANGIOPLASTY USING GEORGE BIOLOGIC PATCH;  Surgeon: Serene Gaile ORN, MD;  Location: [facility] OR;  Service: Vascular;  Laterality: Right;   ROTATOR CUFF REPAIR     right shoulder - 3 times   SPINAL CORD STIMULATOR INSERTION  08/11/2021   TONSILLECTOMY  as child   TRANSURETHRAL RESECTION OF BLADDER TUMOR N/A 02/24/2017   Procedure: TRANSURETHRAL RESECTION OF BLADDER TUMOR (TURBT);  Surgeon: Ricardo Likens, MD;  Location: [facility] ORS;  Service: Urology;  Laterality: N/A;   TRANSURETHRAL RESECTION OF PROSTATE N/A 11/20/2020   Procedure: TRANSURETHRAL RESECTION OF THE PROSTATE (TURP);  Surgeon:  Alvaro Hummer, MD;  Location: [facility];  Service: Urology;  Laterality: N/A;   Patient Active Problem List   Diagnosis Date Noted   Perianal abscess 04/02/2023   Hyponatremia 04/02/2023   Delirium 04/01/2023   Weakness 04/01/2023   Acute cystitis without hematuria 02/23/2023   Syncope and collapse 02/22/2023   AKI (acute kidney injury) 02/22/2023   Unilateral primary osteoarthritis, right hip 03/30/2022   Prostatic hypertrophy 11/20/2020   Internal carotid artery stenosis 02/05/2020   Essential hypertension 02/05/2020   Benign prostatic hyperplasia with urinary retention 02/05/2020   GERD (gastroesophageal reflux disease) 02/05/2020   Chronic back pain  02/05/2020   Vision loss of right eye 02/04/2020   Wound drainage 08/20/2018   Chronic pain of both shoulders 02/22/2017   S/P lumbar spinal fusion 04/15/2015   HNP (herniated nucleus pulposus), lumbar 10/26/2012    Class: Diagnosis of    PCP: Tanda Chesley Eke, MD  REFERRING PROVIDER: Bertrum LELON Gaskins, PA-C  REFERRING DIAG: [phone] (ICD-10-CM) - Heel pain, chronic, left  THERAPY DIAG:  Abnormal posture - Plan: PT plan of care cert/re-cert  Difficulty in walking, not elsewhere classified - Plan: PT plan of care cert/re-cert  Muscle weakness (generalized) - Plan: PT plan of care cert/re-cert  Pain in left foot - Plan: PT plan of care cert/re-cert  Rationale for Evaluation and Treatment: Rehabilitation  ONSET DATE: A least 3 months  SUBJECTIVE:   SUBJECTIVE STATEMENT: [name] feels as if someone is putting a nail through (his) left foot.  It wakes him up between 2- 3 AM and it takes a few hours to get back to sleep.  He has to get up and move around before returning to bed.  PERTINENT HISTORY: OA, Rt leg DVT, 4 previous lumbar surgeries, Rt RTC repair, spinal cord stimulator   PAIN:  Are you having pain? Yes: NPRS scale: 0-10/10 Pain location: Bottom of the left foot Pain description: Like a hammer hitting a nail Aggravating factors: At night at least 30% affect Relieving factors: Movement  PRECAUTIONS: Back and Fall  RED FLAGS: None   WEIGHT BEARING RESTRICTIONS: No  FALLS:  Has patient fallen in last 6 months? No, but needs a walker  LIVING ENVIRONMENT: Lives with: lives with their family and lives with their spouse Lives in: House/apartment Stairs: Uses handrail, have to have it. Has following equipment at home: Vannie - 2 wheeled or 3 wheeled  OCCUPATION: Retired  PLOF: Requires assistive device for independence and Needs assistance with ADLs  PATIENT GOALS: Get rid of left foot pain and sleep without interruption  NEXT MD VISIT:  01/13/2025  OBJECTIVE:  Note: Objective measures were completed at Evaluation unless otherwise noted.  DIAGNOSTIC FINDINGS: 2 views left heel: No acute fractures. Arthrosclerosis noted. Heel spur  present. No bony abnormalities otherwise.  PATIENT SURVEYS:  PSFS: THE PATIENT SPECIFIC FUNCTIONAL SCALE  Place score of 0-10 (0 = unable to perform activity and 10 = able to perform activity at the same level as before injury or problem)  Activity Date: 12/25/2024    Sleeping 6    2.     3.     4.      Total Score 6      Total Score = Sum of activity scores/number of activities  Minimally Detectable Change: 3 points (for single activity); 2 points (for average score)  [facility] (nd). The Patient Specific Functional Scale . Retrieved from [email or web address]   COGNITION: Overall cognitive status: History  of cognitive impairments - at baseline     SENSATION: Yaman has some impaired sensation due to multiple (4) back surgeries  POSTURE: rounded shoulders, forward head, decreased lumbar lordosis, and flexed trunk   PALPATION: Not tender, although Devaun noted his arch is where his pain is at night  LOWER EXTREMITY ROM:  Active ROM Left/Right 12/25/2024   Hip flexion    Hip extension    Hip abduction    Hip adduction    Hip internal rotation    Hip external rotation    Knee flexion    Knee extension    Ankle dorsiflexion -6/-12   Ankle plantarflexion    Ankle inversion    Ankle eversion     (Blank rows = not tested)  LOWER EXTREMITY STRENGTH:  In pounds assessed with a hand-held dynamometer Left/Right 12/25/2024   Hip flexion    Hip extension    Hip abduction    Hip adduction    Hip internal rotation    Hip external rotation    Knee flexion    Knee extension    Ankle dorsiflexion    Ankle plantarflexion    Ankle inversion 6.0/5.8   Ankle eversion 4.8/15.9    (Blank rows = not  tested)  GAIT: Distance walked: 50 feet Assistive device utilized: 3 wheeled walker Level of assistance: Modified independence Comments: Anjel is very limited with his postural impairments and deconditioning                                                                                                                                TREATMENT DATE:  12/25/2024  Upper heel cord box 2 x 1 minute (knees straight, slight toe in) Lower heel cord box 1 minute (knees bent, slight toe in) Heel Raises 2 sets of 10, slow eccentrics  97535: Showed Malacai's wife where to find the box on [employer]; discussed the importance of consistent HEP participation to improve flexibility of VERY tight heel cords and improve strength of weak ankles   PATIENT EDUCATION:  Education details: See above Person educated: Patient and Spouse Education method: Explanation, Demonstration, Tactile cues, Verbal cues, and Handouts Education comprehension: verbalized understanding, returned demonstration, verbal cues required, tactile cues required, and needs further education  HOME EXERCISE PROGRAM: Access Code: [email or web address] URL: [email or web address] Date: 12/25/2024 Prepared by: Lamar Ivory  Exercises - Slant Board Gastrocnemius Stretch  - 2-3 x daily - 7 x weekly - 1 sets - 4-5 reps - 60 seconds hold - Standing Heel Raises  - 3-5 x daily - 7 x weekly - 1 sets - 10 reps - 3 seconds hold   ASSESSMENT:  CLINICAL IMPRESSION: Patient is a 89 y.o. male who was seen today for physical therapy evaluation and treatment for M79.672,G89.29 (ICD-10-CM) - Heel pain, chronic, left.  Evens notes symptoms at night wake him up between 2-3 AM and it can take hours to get back to sleep.  Objectively, he has  very tight heel cords and weak ankles.  His prognosis to meet the below listed goals are good with the recommended plan of care.  OBJECTIVE IMPAIRMENTS: Abnormal gait, cardiopulmonary status limiting activity,  decreased activity tolerance, decreased endurance, decreased knowledge of condition, decreased mobility, difficulty walking, decreased ROM, decreased strength, impaired perceived functional ability, impaired flexibility, postural dysfunction, and pain.   ACTIVITY LIMITATIONS: sleeping, stairs, and locomotion level  PARTICIPATION LIMITATIONS: community activity  PERSONAL FACTORS: OA, Rt leg DVT, 4 previous lumbar surgeries, Rt RTC repair, spinal cord stimulator are also affecting patient's functional outcome.   REHAB POTENTIAL: Good  CLINICAL DECISION MAKING: Evolving/moderate complexity  EVALUATION COMPLEXITY: Moderate   GOALS: Goals reviewed with patient? Yes  SHORT TERM GOALS: Target date: 01/22/2025 Jennie will be independent with his day 1 HEP Baseline: Started 12/25/2024 Goal status: INITIAL  2.  Improve bilateral heel cords flexibility to neutral (0*) Baseline: -6/-12 degrees left/right Goal status: INITIAL  3.  Improve bilateral ankle strength as assessed by hand-held dynamometer Baseline: Inversion 6.0/5.8 pounds and eversion 4.8/15.9 pounds Goal status: INITIAL  LONG TERM GOALS: Target date: 02/19/2025  Improve PSFS to 9 Baseline: 6 Goal status: INITIAL  2.  Emmit will be able to sleep without being awakened by pain between 2-3 AM Baseline: Unable at evaluation Goal status: INITIAL  3.  Oumar will report left foot pain consistently 0-2/10 on the NPRS Baseline: Can be 10/10 Goal status: INITIAL  4.  Improve bilateral heel cords flexibility to 10 degrees Baseline: -6/-12 Goal status: INITIAL  5.  Improve bilateral ankle strength to 30+ pounds for inversion and eversion Baseline: Inversion 6.0/5.8 pounds and eversion 4.8/15.9 pounds Goal status: INITIAL  6.  Latavious will be independent with his long-term HEP at DC Baseline: Started 12/25/2024 Goal status: INITIAL   PLAN:  PT FREQUENCY: 1-2x/week  PT DURATION: 8 weeks  PLANNED INTERVENTIONS: 97750-  Physical Performance Testing, 97110-Therapeutic exercises, 97530- Therapeutic activity, [id number]- Neuromuscular re-education, 97535- Self Care, [id number]- Manual therapy, [id number]- Gait training, [phone] (1-2 muscles), [id number] (3+ muscles)- Dry Needling, Patient/Family education, Balance training, Stair training, and Cryotherapy  PLAN FOR NEXT SESSION: Heel cords stretching and calf/ankle strength to reduce plantar fasciitis like symptoms.   Myer LELON Ivory, PT, MPT 12/25/2024, 5:10 PM  "

## 2024-12-30 NOTE — Therapy (Signed)
 " OUTPATIENT PHYSICAL THERAPY LOWER EXTREMITY TREATMENT Patient Name: Jay Tran MRN: [id number] DOB:May 01, 1934, 89 y.o., male Today's Date: 12/31/2024  END OF SESSION:  PT End of Session - 12/31/24 1341     Visit Number 2    Number of Visits 16    Date for Recertification  02/19/25    Authorization Type Medicare    PT Start Time 1345    PT Stop Time 1423    PT Time Calculation (min) 38 min    Activity Tolerance Patient tolerated treatment well    Behavior During Therapy WFL for tasks assessed/performed           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 (HCC)    Hx: of after 2013 back surgery   GERD (gastroesophageal reflux disease)    Hearing aid worn    Hx: of right ear   Hyperlipemia    Hypertension    Lumbar herniated disc    Lumbar stenosis    Macular degeneration right eye dry   PONV (postoperative nausea and vomiting)    takes iv nausea meds    Seasonal allergies    Hx: of   Stroke [facility])    Past Surgical History:  Procedure Laterality Date   BACK SURGERY  2013, x 3 with dr joshua 2014, 2016 and 2019   x 4, takes back injections monthly   COLONOSCOPY     Hx: of   ENDARTERECTOMY Right 02/07/2020   Procedure: ENDARTERECTOMY CAROTID;  Surgeon: Serene Gaile ORN, MD;  Location: [facility] OR;  Service: Vascular;  Laterality: Right;   EYE SURGERY     bil catarcts 02/2016   HERNIA REPAIR  several yrs ago   inguinal   INCISION AND DRAINAGE ABSCESS N/A 04/02/2023   Procedure: INCISION AND DRAINAGE,  LEFT GLUTEAL ABSCESS;  Surgeon: Stevie Herlene Righter, MD;  Location: [facility] OR;  Service: General;  Laterality: N/A;   LUMBAR LAMINECTOMY  10/26/2012   Procedure: MICRODISCECTOMY LUMBAR LAMINECTOMY;  Surgeon: Oneil JAYSON Herald, MD;  Location: [facility] OR;  Service: Orthopedics;  Laterality: Right;  Right L4-5 Microdiscectomy   LUMBAR WOUND  DEBRIDEMENT N/A 09/15/2021   Procedure: revision of thoracic incision, Irrigation and debridement of battery pocket wound;  Surgeon: Joshua Alm RAMAN, MD;  Location: [facility] OR;  Service: Neurosurgery;  Laterality: N/A;   MAXIMUM ACCESS (MAS)POSTERIOR LUMBAR INTERBODY FUSION (PLIF) 1 LEVEL N/A 04/15/2015   Procedure: Maximum Access Surgery Posterior Lumbar Interbody Fusion Lumbar two-Lumbar three extension of hardware;  Surgeon: Alm RAMAN Joshua, MD;  Location: [facility];  Service: Neurosurgery;  Laterality: N/A;   PATCH ANGIOPLASTY Right 02/07/2020   Procedure: PATCH ANGIOPLASTY USING GEORGE BIOLOGIC PATCH;  Surgeon: Serene Gaile ORN, MD;  Location: [facility] OR;  Service: Vascular;  Laterality: Right;   ROTATOR CUFF REPAIR     right shoulder - 3 times   SPINAL CORD STIMULATOR INSERTION  08/11/2021   TONSILLECTOMY  as child   TRANSURETHRAL RESECTION OF BLADDER TUMOR N/A 02/24/2017   Procedure: TRANSURETHRAL RESECTION OF BLADDER TUMOR (TURBT);  Surgeon: Ricardo Likens, MD;  Location: [facility] ORS;  Service: Urology;  Laterality: N/A;   TRANSURETHRAL RESECTION OF PROSTATE N/A 11/20/2020   Procedure: TRANSURETHRAL RESECTION OF THE PROSTATE (TURP);  Surgeon: Likens Ricardo, MD;  Location: [facility];  Service: Urology;  Laterality: N/A;  Patient Active Problem List   Diagnosis Date Noted   Perianal abscess 04/02/2023   Hyponatremia 04/02/2023   Delirium 04/01/2023   Weakness 04/01/2023   Acute cystitis without hematuria 02/23/2023   Syncope and collapse 02/22/2023   AKI (acute kidney injury) 02/22/2023   Unilateral primary osteoarthritis, right hip 03/30/2022   Prostatic hypertrophy 11/20/2020   Internal carotid artery stenosis 02/05/2020   Essential hypertension 02/05/2020   Benign prostatic hyperplasia with urinary retention 02/05/2020   GERD (gastroesophageal reflux disease) 02/05/2020   Chronic back pain 02/05/2020   Vision loss of right eye 02/04/2020   Wound drainage 08/20/2018    Chronic pain of both shoulders 02/22/2017   S/P lumbar spinal fusion 04/15/2015   HNP (herniated nucleus pulposus), lumbar 10/26/2012    Class: Diagnosis of    PCP: Tanda Chesley Eke, MD  REFERRING PROVIDER: Bertrum LELON Gaskins, PA-C  REFERRING DIAG: [phone] (ICD-10-CM) - Heel pain, chronic, left  THERAPY DIAG:  Abnormal posture  Difficulty in walking, not elsewhere classified  Muscle weakness (generalized)  Pain in left foot  Unsteadiness on feet  Other abnormalities of gait and mobility  Rationale for Evaluation and Treatment: Rehabilitation  ONSET DATE: A least 3 months  SUBJECTIVE:   SUBJECTIVE STATEMENT: Kathleen states last night did not have the L foot pain.  They did get the incline board. PERTINENT HISTORY: OA, Rt leg DVT, 4 previous lumbar surgeries, Rt RTC repair, spinal cord stimulator   PAIN:  Are you having pain? Yes: NPRS scale: 0/10 Pain location: Bottom of the left foot Pain description: Like a hammer hitting a nail Aggravating factors: At night at least 30% affect Relieving factors: Movement  PRECAUTIONS: Back and Fall  RED FLAGS: None   WEIGHT BEARING RESTRICTIONS: No  FALLS:  Has patient fallen in last 6 months? No, but needs a walker  LIVING ENVIRONMENT: Lives with: lives with their family and lives with their spouse Lives in: House/apartment Stairs: Uses handrail, have to have it. Has following equipment at home: Vannie - 2 wheeled or 3 wheeled  OCCUPATION: Retired  PLOF: Requires assistive device for independence and Needs assistance with ADLs  PATIENT GOALS: Get rid of left foot pain and sleep without interruption  NEXT MD VISIT: 01/13/2025  OBJECTIVE:  Note: Objective measures were completed at Evaluation unless otherwise noted.  DIAGNOSTIC FINDINGS: 2 views left heel: No acute fractures. Arthrosclerosis noted. Heel spur  present. No bony abnormalities otherwise.  PATIENT SURVEYS:  PSFS: THE PATIENT SPECIFIC  FUNCTIONAL SCALE  Place score of 0-10 (0 = unable to perform activity and 10 = able to perform activity at the same level as before injury or problem)  Activity Date: 12/25/2024    Sleeping 6    2.     3.     4.      Total Score 6      Total Score = Sum of activity scores/number of activities  Minimally Detectable Change: 3 points (for single activity); 2 points (for average score)  [facility] (nd). The Patient Specific Functional Scale . Retrieved from [email or web address]   COGNITION: Overall cognitive status: History of cognitive impairments - at baseline     SENSATION: Eula has some impaired sensation due to multiple (4) back surgeries  POSTURE: rounded shoulders, forward head, decreased lumbar lordosis, and flexed trunk   PALPATION: Not tender, although Qamar noted his arch is where his pain is at night  LOWER EXTREMITY ROM:  Active ROM Left/Right 12/25/2024   Hip  flexion    Hip extension    Hip abduction    Hip adduction    Hip internal rotation    Hip external rotation    Knee flexion    Knee extension    Ankle dorsiflexion -6/-12   Ankle plantarflexion    Ankle inversion    Ankle eversion     (Blank rows = not tested)  LOWER EXTREMITY STRENGTH:  In pounds assessed with a hand-held dynamometer Left/Right 12/25/2024   Hip flexion    Hip extension    Hip abduction    Hip adduction    Hip internal rotation    Hip external rotation    Knee flexion    Knee extension    Ankle dorsiflexion    Ankle plantarflexion    Ankle inversion 6.0/5.8   Ankle eversion 4.8/15.9    (Blank rows = not tested)  GAIT: Distance walked: 50 feet Assistive device utilized: 3 wheeled walker Level of assistance: Modified independence Comments: Bay is very limited with his postural impairments and deconditioning                                                                                                                                 TREATMENT DATE:  12/31/24*** Ankle DF with TB green 2x10 SAQ 2x10 #1 Seated gastroc strap stretch 3x25 sec Seated hip abduction TB  2x10 green Seated LAQ 2x10 Standing gastroc stretch 3x25 sec with CGA     Access Code: [email or web address] URL: [email or web address] Date: 12/31/2024 Prepared by: Burnard Meth  Exercises - Long Sitting Calf Stretch with Strap  - 2 x daily - 7 x weekly - 1 sets - 3 reps - 25 hold - Seated Gastroc Stretch with Strap  - 2 x daily - 7 x weekly - 1 sets - 3 reps - 25 hold 12/25/2024  Upper heel cord box 2 x 1 minute (knees straight, slight toe in) Lower heel cord box 1 minute (knees bent, slight toe in) Heel Raises 2 sets of 10, slow eccentrics  [id number]: Showed Abhay's wife where to find the box on [employer]; discussed the importance of consistent HEP participation to improve flexibility of VERY tight heel cords and improve strength of weak ankles   PATIENT EDUCATION:  Education details: See above Person educated: Patient and Spouse Education method: Explanation, Demonstration, Tactile cues, Verbal cues, and Handouts Education comprehension: verbalized understanding, returned demonstration, verbal cues required, tactile cues required, and needs further education  HOME EXERCISE PROGRAM: Access Code: [email or web address] URL: [email or web address] Date: 12/25/2024 Prepared by: Lamar Ivory  Exercises - Slant Board Gastrocnemius Stretch  - 2-3 x daily - 7 x weekly - 1 sets - 4-5 reps - 60 seconds hold - Standing Heel Raises  - 3-5 x daily - 7 x weekly - 1 sets - 10 reps - 3 seconds hold   ASSESSMENT:  CLINICAL IMPRESSION: ***Patient is a 89 y.o. male who was seen today for physical therapy evaluation  and treatment for M79.672,G89.29 (ICD-10-CM) - Heel pain, chronic, left.  Egan notes symptoms at night wake him up between 2-3 AM and it can take hours to get back to sleep.  Objectively, he has very tight heel cords and  weak ankles.  His prognosis to meet the below listed goals are good with the recommended plan of care.  OBJECTIVE IMPAIRMENTS: Abnormal gait, cardiopulmonary status limiting activity, decreased activity tolerance, decreased endurance, decreased knowledge of condition, decreased mobility, difficulty walking, decreased ROM, decreased strength, impaired perceived functional ability, impaired flexibility, postural dysfunction, and pain.   ACTIVITY LIMITATIONS: sleeping, stairs, and locomotion level  PARTICIPATION LIMITATIONS: community activity  PERSONAL FACTORS: OA, Rt leg DVT, 4 previous lumbar surgeries, Rt RTC repair, spinal cord stimulator are also affecting patient's functional outcome.   REHAB POTENTIAL: Good  CLINICAL DECISION MAKING: Evolving/moderate complexity  EVALUATION COMPLEXITY: Moderate   GOALS: Goals reviewed with patient? Yes  SHORT TERM GOALS: Target date: 01/22/2025 Verne will be independent with his day 1 HEP Baseline: Started 12/25/2024 Goal status: INITIAL  2.  Improve bilateral heel cords flexibility to neutral (0*) Baseline: -6/-12 degrees left/right Goal status: INITIAL  3.  Improve bilateral ankle strength as assessed by hand-held dynamometer Baseline: Inversion 6.0/5.8 pounds and eversion 4.8/15.9 pounds Goal status: INITIAL  LONG TERM GOALS: Target date: 02/19/2025  Improve PSFS to 9 Baseline: 6 Goal status: INITIAL  2.  Riki will be able to sleep without being awakened by pain between 2-3 AM Baseline: Unable at evaluation Goal status: INITIAL  3.  Shean will report left foot pain consistently 0-2/10 on the NPRS Baseline: Can be 10/10 Goal status: INITIAL  4.  Improve bilateral heel cords flexibility to 10 degrees Baseline: -6/-12 Goal status: INITIAL  5.  Improve bilateral ankle strength to 30+ pounds for inversion and eversion Baseline: Inversion 6.0/5.8 pounds and eversion 4.8/15.9 pounds Goal status: INITIAL  6.  Jarquez will be  independent with his long-term HEP at DC Baseline: Started 12/25/2024 Goal status: INITIAL   PLAN:  PT FREQUENCY: 1-2x/week  PT DURATION: 8 weeks  PLANNED INTERVENTIONS: 97750- Physical Performance Testing, 97110-Therapeutic exercises, 97530- Therapeutic activity, [id number]- Neuromuscular re-education, 97535- Self Care, 02859- Manual therapy, [id number]- Gait training, [phone] (1-2 muscles), [id number] (3+ muscles)- Dry Needling, Patient/Family education, Balance training, Stair training, and Cryotherapy  PLAN FOR NEXT SESSION: ***Heel cords stretching and calf/ankle strength to reduce plantar fasciitis like symptoms.   Burnard CHRISTELLA Meth, PT, MPT 12/31/2024, 3:28 PM  "

## 2024-12-31 ENCOUNTER — Ambulatory Visit

## 2024-12-31 DIAGNOSIS — R293 Abnormal posture: Secondary | ICD-10-CM | POA: Diagnosis not present

## 2024-12-31 DIAGNOSIS — R2681 Unsteadiness on feet: Secondary | ICD-10-CM

## 2024-12-31 DIAGNOSIS — R2689 Other abnormalities of gait and mobility: Secondary | ICD-10-CM | POA: Diagnosis not present

## 2024-12-31 DIAGNOSIS — R262 Difficulty in walking, not elsewhere classified: Secondary | ICD-10-CM

## 2024-12-31 DIAGNOSIS — M6281 Muscle weakness (generalized): Secondary | ICD-10-CM | POA: Diagnosis not present

## 2024-12-31 DIAGNOSIS — M79672 Pain in left foot: Secondary | ICD-10-CM | POA: Diagnosis not present

## 2025-01-07 ENCOUNTER — Encounter: Payer: Self-pay | Admitting: Rehabilitative and Restorative Service Providers"

## 2025-01-07 ENCOUNTER — Ambulatory Visit: Admitting: Rehabilitative and Restorative Service Providers"

## 2025-01-07 DIAGNOSIS — R293 Abnormal posture: Secondary | ICD-10-CM | POA: Diagnosis not present

## 2025-01-07 DIAGNOSIS — M79672 Pain in left foot: Secondary | ICD-10-CM

## 2025-01-07 DIAGNOSIS — M6281 Muscle weakness (generalized): Secondary | ICD-10-CM

## 2025-01-07 DIAGNOSIS — R262 Difficulty in walking, not elsewhere classified: Secondary | ICD-10-CM

## 2025-01-07 NOTE — Therapy (Signed)
 " OUTPATIENT PHYSICAL THERAPY LOWER EXTREMITY TREATMENT  Patient Name: Jay Tran MRN: [id number] DOB:May 04, 1934, 89 y.o., male Today's Date: 01/07/2025  END OF SESSION:  PT End of Session - 01/07/25 1335     Visit Number 3    Number of Visits 16    Date for Recertification  02/19/25    Authorization Type Medicare    PT Start Time 1335    PT Stop Time 1423    PT Time Calculation (min) 48 min    Activity Tolerance Patient tolerated treatment well;No increased pain    Behavior During Therapy WFL for tasks assessed/performed            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 (HCC)    Hx: of after 2013 back surgery   GERD (gastroesophageal reflux disease)    Hearing aid worn    Hx: of right ear   Hyperlipemia    Hypertension    Lumbar herniated disc    Lumbar stenosis    Macular degeneration right eye dry   PONV (postoperative nausea and vomiting)    takes iv nausea meds    Seasonal allergies    Hx: of   Stroke [facility])    Past Surgical History:  Procedure Laterality Date   BACK SURGERY  2013, x 3 with dr joshua 2014, 2016 and 2019   x 4, takes back injections monthly   COLONOSCOPY     Hx: of   ENDARTERECTOMY Right 02/07/2020   Procedure: ENDARTERECTOMY CAROTID;  Surgeon: Serene Gaile ORN, MD;  Location: [facility] OR;  Service: Vascular;  Laterality: Right;   EYE SURGERY     bil catarcts 02/2016   HERNIA REPAIR  several yrs ago   inguinal   INCISION AND DRAINAGE ABSCESS N/A 04/02/2023   Procedure: INCISION AND DRAINAGE,  LEFT GLUTEAL ABSCESS;  Surgeon: Stevie Herlene Righter, MD;  Location: [facility] OR;  Service: General;  Laterality: N/A;   LUMBAR LAMINECTOMY  10/26/2012   Procedure: MICRODISCECTOMY LUMBAR LAMINECTOMY;  Surgeon: Oneil JAYSON Herald, MD;  Location: [facility] OR;  Service: Orthopedics;  Laterality: Right;  Right L4-5 Microdiscectomy    LUMBAR WOUND DEBRIDEMENT N/A 09/15/2021   Procedure: revision of thoracic incision, Irrigation and debridement of battery pocket wound;  Surgeon: Joshua Alm RAMAN, MD;  Location: [facility] OR;  Service: Neurosurgery;  Laterality: N/A;   MAXIMUM ACCESS (MAS)POSTERIOR LUMBAR INTERBODY FUSION (PLIF) 1 LEVEL N/A 04/15/2015   Procedure: Maximum Access Surgery Posterior Lumbar Interbody Fusion Lumbar two-Lumbar three extension of hardware;  Surgeon: Alm RAMAN Joshua, MD;  Location: [facility] OR[facility];  Service: Neurosurgery;  Laterality: N/A;   PATCH ANGIOPLASTY Right 02/07/2020   Procedure: PATCH ANGIOPLASTY USING GEORGE BIOLOGIC PATCH;  Surgeon: Serene Gaile ORN, MD;  Location: [facility] OR;  Service: Vascular;  Laterality: Right;   ROTATOR CUFF REPAIR     right shoulder - 3 times   SPINAL CORD STIMULATOR INSERTION  08/11/2021   TONSILLECTOMY  as child   TRANSURETHRAL RESECTION OF BLADDER TUMOR N/A 02/24/2017   Procedure: TRANSURETHRAL RESECTION OF BLADDER TUMOR (TURBT);  Surgeon: Ricardo Likens, MD;  Location: [facility] ORS;  Service: Urology;  Laterality: N/A;   TRANSURETHRAL RESECTION OF PROSTATE N/A 11/20/2020   Procedure: TRANSURETHRAL RESECTION OF THE PROSTATE (TURP);  Surgeon: Likens Ricardo, MD;  Location: [facility];  Service:  Urology;  Laterality: N/A;   Patient Active Problem List   Diagnosis Date Noted   Perianal abscess 04/02/2023   Hyponatremia 04/02/2023   Delirium 04/01/2023   Weakness 04/01/2023   Acute cystitis without hematuria 02/23/2023   Syncope and collapse 02/22/2023   AKI (acute kidney injury) 02/22/2023   Unilateral primary osteoarthritis, right hip 03/30/2022   Prostatic hypertrophy 11/20/2020   Internal carotid artery stenosis 02/05/2020   Essential hypertension 02/05/2020   Benign prostatic hyperplasia with urinary retention 02/05/2020   GERD (gastroesophageal reflux disease) 02/05/2020   Chronic back pain 02/05/2020   Vision loss of right eye 02/04/2020   Wound drainage  08/20/2018   Chronic pain of both shoulders 02/22/2017   S/P lumbar spinal fusion 04/15/2015   HNP (herniated nucleus pulposus), lumbar 10/26/2012    Class: Diagnosis of    PCP: Tanda Chesley Eke, MD  REFERRING PROVIDER: Bertrum LELON Gaskins, PA-C  REFERRING DIAG: [phone] (ICD-10-CM) - Heel pain, chronic, left  THERAPY DIAG:  Abnormal posture  Difficulty in walking, not elsewhere classified  Muscle weakness (generalized)  Pain in left foot  Rationale for Evaluation and Treatment: Rehabilitation  ONSET DATE: A least 3 months  SUBJECTIVE:   SUBJECTIVE STATEMENT: Jay Tran is now waking up closer to 6 AM with left heel pain (was 2-3 AM).  HEP compliance is 1 x a day (2 x recommended).  PERTINENT HISTORY: OA, Rt leg DVT, 4 previous lumbar surgeries, Rt RTC repair, spinal cord stimulator   PAIN:  Are you having pain? Yes: NPRS scale: 0 most of the time but can still be 10/10 Pain location: Bottom of the left foot Pain description: Like a hammer hitting a nail Aggravating factors: At night at least 30% affect Relieving factors: Movement  PRECAUTIONS: Back and Fall  RED FLAGS: None   WEIGHT BEARING RESTRICTIONS: No  FALLS:  Has patient fallen in last 6 months? No, but needs a walker  LIVING ENVIRONMENT: Lives with: lives with their family and lives with their spouse Lives in: House/apartment Stairs: Uses handrail, have to have it. Has following equipment at home: Jay Tran - 2 wheeled or 3 wheeled  OCCUPATION: Retired  PLOF: Requires assistive device for independence and Needs assistance with ADLs  PATIENT GOALS: Get rid of left foot pain and sleep without interruption  NEXT MD VISIT: 01/13/2025  OBJECTIVE:  Note: Objective measures were completed at Evaluation unless otherwise noted.  DIAGNOSTIC FINDINGS: 2 views left heel: No acute fractures. Arthrosclerosis noted. Heel spur  present. No bony abnormalities otherwise.  PATIENT SURVEYS:  PSFS: THE  PATIENT SPECIFIC FUNCTIONAL SCALE  Place score of 0-10 (0 = unable to perform activity and 10 = able to perform activity at the same level as before injury or problem)  Activity Date: 12/25/2024    Sleeping 6    2.     3.     4.      Total Score 6      Total Score = Sum of activity scores/number of activities  Minimally Detectable Change: 3 points (for single activity); 2 points (for average score)  [facility] (nd). The Patient Specific Functional Scale . Retrieved from [email or web address]   COGNITION: Overall cognitive status: History of cognitive impairments - at baseline     SENSATION: Jay Tran has some impaired sensation due to multiple (4) back surgeries  POSTURE: rounded shoulders, forward head, decreased lumbar lordosis, and flexed trunk   PALPATION: Not tender, although Jay Tran noted his arch is where his pain is  at night  LOWER EXTREMITY ROM:  Active ROM Left/Right 12/25/2024 Left/Right 01/07/2025  Hip flexion    Hip extension    Hip abduction    Hip adduction    Hip internal rotation    Hip external rotation    Knee flexion    Knee extension    Ankle dorsiflexion -6/-12 -5/-10  Ankle plantarflexion    Ankle inversion    Ankle eversion     (Blank rows = not tested)  LOWER EXTREMITY STRENGTH:  In pounds assessed with a hand-held dynamometer Left/Right 12/25/2024   Hip flexion    Hip extension    Hip abduction    Hip adduction    Hip internal rotation    Hip external rotation    Knee flexion    Knee extension    Ankle dorsiflexion    Ankle plantarflexion    Ankle inversion 6.0/5.8   Ankle eversion 4.8/15.9    (Blank rows = not tested)  GAIT: Distance walked: 50 feet Assistive device utilized: 3 wheeled walker Level of assistance: Modified independence Comments: Jay Tran is Jay limited with his postural impairments and deconditioning                                                                                                                                 TREATMENT DATE:  01/07/2025 Upper heel cord box 5 x 1 minute (knees straight, slight toe in) Lower heel cord box 5 x 1 minute (knees bent, slight toe in) Heel Raises 2 sets of 10, slow eccentrics Seated ankle dorsiflexion stretch with belt 5 x 20 seconds each side  02464: Showed Jay Tran where to find a bike seat cushion/cover on Jay Tran; discussed the importance of continuing consistent HEP participation to improve flexibility of Jay Tran and improve strength of weak ankles   12/31/24 Ankle DF with TB green 2x10 SAQ 2x10 #1 Seated gastroc strap stretch 3x25 sec Seated hip abduction TB  2x10 green Seated LAQ 2x10 Standing gastroc stretch 3x25 sec with CGA   Access Code: [email or web address] URL: [email or web address] Date: 12/31/2024 Prepared by: Burnard Meth  Exercises - Long Sitting Calf Stretch with Strap  - 2 x daily - 7 x weekly - 1 sets - 3 reps - 25 hold - Seated Gastroc Stretch with Strap  - 2 x daily - 7 x weekly - 1 sets - 3 reps - 25 hold  12/25/2024  Upper heel cord box 2 x 1 minute (knees straight, slight toe in) Lower heel cord box 1 minute (knees bent, slight toe in) Heel Raises 2 sets of 10, slow eccentrics  97535: Showed Jay Tran's Tran where to find the box on [employer]; discussed the importance of consistent HEP participation to improve flexibility of Jay Tran and improve strength of weak ankles   PATIENT EDUCATION:  Education details: See above Person educated: Patient and Spouse Education method: Explanation, Demonstration, Tactile cues, Verbal cues, and Handouts Education  comprehension: verbalized understanding, returned demonstration, verbal cues required, tactile cues required, and needs further education  HOME EXERCISE PROGRAM: Access Code: [email or web address] URL: [email or web address] Date: 12/25/2024 Prepared by: Lamar Ivory  Exercises -  Slant Board Gastrocnemius Stretch  - 2-3 x daily - 7 x weekly - 1 sets - 4-5 reps - 60 seconds hold - Standing Heel Raises  - 3-5 x daily - 7 x weekly - 1 sets - 10 reps - 3 seconds hold   ASSESSMENT:  CLINICAL IMPRESSION: Jay Tran is getting more sleep before his left heel pain wakes him up, demonstrating early functional progress.  Bilateral heel Tran remain stiff but objectively better than at evaluation.  Jay Tran will benefit from the recommended course of PT to meet long-term goals.    OBJECTIVE IMPAIRMENTS: Abnormal gait, cardiopulmonary status limiting activity, decreased activity tolerance, decreased endurance, decreased knowledge of condition, decreased mobility, difficulty walking, decreased ROM, decreased strength, impaired perceived functional ability, impaired flexibility, postural dysfunction, and pain.   ACTIVITY LIMITATIONS: sleeping, stairs, and locomotion level  PARTICIPATION LIMITATIONS: community activity  PERSONAL FACTORS: OA, Rt leg DVT, 4 previous lumbar surgeries, Rt RTC repair, spinal cord stimulator are also affecting patient's functional outcome.   REHAB POTENTIAL: Good  CLINICAL DECISION MAKING: Evolving/moderate complexity  EVALUATION COMPLEXITY: Moderate   GOALS: Goals reviewed with patient? Yes  SHORT TERM GOALS: Target date: 01/22/2025 Jay Tran will be independent with his day 1 HEP Baseline: Started 12/25/2024 Goal status: Ongoing 01/07/2025  2.  Improve bilateral heel Tran flexibility to neutral (0*) Baseline: -6/-12 degrees left/right Goal status: Ongoing 01/07/2025  3.  Improve bilateral ankle strength as assessed by hand-held dynamometer Baseline: Inversion 6.0/5.8 pounds and eversion 4.8/15.9 pounds Goal status: INITIAL  LONG TERM GOALS: Target date: 02/19/2025  Improve PSFS to 9 Baseline: 6 Goal status: INITIAL  2.  Jay Tran will be able to sleep without being awakened by pain between 2-3 AM Baseline: Unable at evaluation Goal status:  INITIAL  3.  Jay Tran will report left foot pain consistently 0-2/10 on the NPRS Baseline: Can be 10/10 Goal status: INITIAL  4.  Improve bilateral heel Tran flexibility to 10 degrees Baseline: -6/-12 Goal status: INITIAL  5.  Improve bilateral ankle strength to 30+ pounds for inversion and eversion Baseline: Inversion 6.0/5.8 pounds and eversion 4.8/15.9 pounds Goal status: INITIAL  6.  Jay Tran will be independent with his long-term HEP at DC Baseline: Started 12/25/2024 Goal status: INITIAL   PLAN:  PT FREQUENCY: 1-2x/week  PT DURATION: 8 weeks  PLANNED INTERVENTIONS: 97750- Physical Performance Testing, 97110-Therapeutic exercises, 97530- Therapeutic activity, [id number]- Neuromuscular re-education, 97535- Self Care, [id number]- Manual therapy, [id number]- Gait training, [phone] (1-2 muscles), [id number] (3+ muscles)- Dry Needling, Patient/Family education, Balance training, Stair training, and Cryotherapy  PLAN FOR NEXT SESSION: Heel Tran stretching and calf/ankle strength to reduce plantar fasciitis like symptoms.   Myer LELON Ivory, PT, MPT 01/07/2025, 2:28 PM  "

## 2025-01-09 ENCOUNTER — Ambulatory Visit

## 2025-01-09 DIAGNOSIS — R296 Repeated falls: Secondary | ICD-10-CM

## 2025-01-09 DIAGNOSIS — R2689 Other abnormalities of gait and mobility: Secondary | ICD-10-CM

## 2025-01-09 DIAGNOSIS — R2681 Unsteadiness on feet: Secondary | ICD-10-CM

## 2025-01-09 DIAGNOSIS — R293 Abnormal posture: Secondary | ICD-10-CM

## 2025-01-09 DIAGNOSIS — R262 Difficulty in walking, not elsewhere classified: Secondary | ICD-10-CM | POA: Diagnosis not present

## 2025-01-09 DIAGNOSIS — M79672 Pain in left foot: Secondary | ICD-10-CM | POA: Diagnosis not present

## 2025-01-09 DIAGNOSIS — M6281 Muscle weakness (generalized): Secondary | ICD-10-CM

## 2025-01-09 NOTE — Therapy (Signed)
 " OUTPATIENT PHYSICAL THERAPY LOWER EXTREMITY TREATMENT  Patient Name: Jay Tran MRN: [id number] DOB:07/17/1934, 89 y.o., male Today's Date: 01/09/2025  END OF SESSION:  PT End of Session - 01/09/25 1339     Visit Number 4    Number of Visits 16    Date for Recertification  02/19/25    Authorization Type Medicare    PT Start Time 1300    PT Stop Time 1331    PT Time Calculation (min) 31 min    Activity Tolerance Patient tolerated treatment well    Behavior During Therapy [facility] for tasks assessed/performed             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 (HCC)    Hx: of after 2013 back surgery   GERD (gastroesophageal reflux disease)    Hearing aid worn    Hx: of right ear   Hyperlipemia    Hypertension    Lumbar herniated disc    Lumbar stenosis    Macular degeneration right eye dry   PONV (postoperative nausea and vomiting)    takes iv nausea meds    Seasonal allergies    Hx: of   Stroke [facility])    Past Surgical History:  Procedure Laterality Date   BACK SURGERY  2013, x 3 with dr joshua 2014, 2016 and 2019   x 4, takes back injections monthly   COLONOSCOPY     Hx: of   ENDARTERECTOMY Right 02/07/2020   Procedure: ENDARTERECTOMY CAROTID;  Surgeon: Serene Gaile ORN, MD;  Location: [facility] OR;  Service: Vascular;  Laterality: Right;   EYE SURGERY     bil catarcts 02/2016   HERNIA REPAIR  several yrs ago   inguinal   INCISION AND DRAINAGE ABSCESS N/A 04/02/2023   Procedure: INCISION AND DRAINAGE,  LEFT GLUTEAL ABSCESS;  Surgeon: Stevie Herlene Righter, MD;  Location: [facility] OR;  Service: General;  Laterality: N/A;   LUMBAR LAMINECTOMY  10/26/2012   Procedure: MICRODISCECTOMY LUMBAR LAMINECTOMY;  Surgeon: Oneil JAYSON Herald, MD;  Location: [facility] OR;  Service: Orthopedics;  Laterality: Right;  Right L4-5 Microdiscectomy   LUMBAR WOUND  DEBRIDEMENT N/A 09/15/2021   Procedure: revision of thoracic incision, Irrigation and debridement of battery pocket wound;  Surgeon: Joshua Alm RAMAN, MD;  Location: [facility] OR;  Service: Neurosurgery;  Laterality: N/A;   MAXIMUM ACCESS (MAS)POSTERIOR LUMBAR INTERBODY FUSION (PLIF) 1 LEVEL N/A 04/15/2015   Procedure: Maximum Access Surgery Posterior Lumbar Interbody Fusion Lumbar two-Lumbar three extension of hardware;  Surgeon: Alm RAMAN Joshua, MD;  Location: [facility];  Service: Neurosurgery;  Laterality: N/A;   PATCH ANGIOPLASTY Right 02/07/2020   Procedure: PATCH ANGIOPLASTY USING GEORGE BIOLOGIC PATCH;  Surgeon: Serene Gaile ORN, MD;  Location: [facility] OR;  Service: Vascular;  Laterality: Right;   ROTATOR CUFF REPAIR     right shoulder - 3 times   SPINAL CORD STIMULATOR INSERTION  08/11/2021   TONSILLECTOMY  as child   TRANSURETHRAL RESECTION OF BLADDER TUMOR N/A 02/24/2017   Procedure: TRANSURETHRAL RESECTION OF BLADDER TUMOR (TURBT);  Surgeon: Ricardo Likens, MD;  Location: [facility] ORS;  Service: Urology;  Laterality: N/A;   TRANSURETHRAL RESECTION OF PROSTATE N/A 11/20/2020   Procedure: TRANSURETHRAL RESECTION OF THE PROSTATE (TURP);  Surgeon: Likens Ricardo, MD;  Location: [facility];  Service: Urology;  Laterality: N/A;   Patient Active Problem List   Diagnosis Date Noted   Perianal abscess 04/02/2023   Hyponatremia 04/02/2023   Delirium 04/01/2023   Weakness 04/01/2023   Acute cystitis without hematuria 02/23/2023   Syncope and collapse 02/22/2023   AKI (acute kidney injury) 02/22/2023   Unilateral primary osteoarthritis, right hip 03/30/2022   Prostatic hypertrophy 11/20/2020   Internal carotid artery stenosis 02/05/2020   Essential hypertension 02/05/2020   Benign prostatic hyperplasia with urinary retention 02/05/2020   GERD (gastroesophageal reflux disease) 02/05/2020   Chronic back pain 02/05/2020   Vision loss of right eye 02/04/2020   Wound drainage 08/20/2018    Chronic pain of both shoulders 02/22/2017   S/P lumbar spinal fusion 04/15/2015   HNP (herniated nucleus pulposus), lumbar 10/26/2012    Class: Diagnosis of    PCP: Tanda Chesley Eke, MD  REFERRING PROVIDER: Bertrum LELON Gaskins, PA-C  REFERRING DIAG: [phone] (ICD-10-CM) - Heel pain, chronic, left  THERAPY DIAG:  Abnormal posture  Difficulty in walking, not elsewhere classified  Muscle weakness (generalized)  Pain in left foot  Unsteadiness on feet  Other abnormalities of gait and mobility  Repeated falls  Rationale for Evaluation and Treatment: Rehabilitation  ONSET DATE: A least 3 months  SUBJECTIVE:   SUBJECTIVE STATEMENT: Kylie states he has not had the night pain in 2 nights. PERTINENT HISTORY: OA, Rt leg DVT, 4 previous lumbar surgeries, Rt RTC repair, spinal cord stimulator   PAIN:  Are you having pain? Yes: NPRS scale: 0 most of the time but can still be 10/10 Pain location: Bottom of the left foot Pain description: Like a hammer hitting a nail Aggravating factors: At night at least 30% affect Relieving factors: Movement  PRECAUTIONS: Back and Fall  RED FLAGS: None   WEIGHT BEARING RESTRICTIONS: No  FALLS:  Has patient fallen in last 6 months? No, but needs a walker  LIVING ENVIRONMENT: Lives with: lives with their family and lives with their spouse Lives in: House/apartment Stairs: Uses handrail, have to have it. Has following equipment at home: Vannie - 2 wheeled or 3 wheeled  OCCUPATION: Retired  PLOF: Requires assistive device for independence and Needs assistance with ADLs  PATIENT GOALS: Get rid of left foot pain and sleep without interruption  NEXT MD VISIT: 01/13/2025  OBJECTIVE:  Note: Objective measures were completed at Evaluation unless otherwise noted.  DIAGNOSTIC FINDINGS: 2 views left heel: No acute fractures. Arthrosclerosis noted. Heel spur  present. No bony abnormalities otherwise.  PATIENT SURVEYS:  PSFS:  THE PATIENT SPECIFIC FUNCTIONAL SCALE  Place score of 0-10 (0 = unable to perform activity and 10 = able to perform activity at the same level as before injury or problem)  Activity Date: 12/25/2024    Sleeping 6    2.     3.     4.      Total Score 6      Total Score = Sum of activity scores/number of activities  Minimally Detectable Change: 3 points (for single activity); 2 points (for average score)  [facility] (nd). The Patient Specific Functional Scale . Retrieved from [email or web address]   COGNITION: Overall cognitive status: History of cognitive impairments - at baseline     SENSATION: Wiley has some impaired sensation due to multiple (4) back surgeries  POSTURE: rounded shoulders, forward head, decreased lumbar lordosis, and flexed trunk   PALPATION: Not tender, although [place] noted his arch is where his pain is at night  LOWER  EXTREMITY ROM:  Active ROM Left/Right 12/25/2024 Left/Right 01/07/2025  Hip flexion    Hip extension    Hip abduction    Hip adduction    Hip internal rotation    Hip external rotation    Knee flexion    Knee extension    Ankle dorsiflexion -6/-12 -5/-10  Ankle plantarflexion    Ankle inversion    Ankle eversion     (Blank rows = not tested)  LOWER EXTREMITY STRENGTH:  In pounds assessed with a hand-held dynamometer Left/Right 12/25/2024   Hip flexion    Hip extension    Hip abduction    Hip adduction    Hip internal rotation    Hip external rotation    Knee flexion    Knee extension    Ankle dorsiflexion    Ankle plantarflexion    Ankle inversion 6.0/5.8   Ankle eversion 4.8/15.9    (Blank rows = not tested)  GAIT: Distance walked: 50 feet Assistive device utilized: 3 wheeled walker Level of assistance: Modified independence Comments: Prudencio is very limited with his postural impairments and deconditioning                                                                                                                                 TREATMENT DATE:  01/09/25 Upper heel cord box 5 x 1 minute (knees straight, slight toe in) Lower heel cord box 5 x 1 minute (knees bent, slight toe in) Heel Raises 2 sets of 10, slow eccentrics Seated LAQ 3x10 Seated Marching  2x20  01/07/2025 Upper heel cord box 5 x 1 minute (knees straight, slight toe in) Lower heel cord box 5 x 1 minute (knees bent, slight toe in) Heel Raises 2 sets of 10, slow eccentrics Seated ankle dorsiflexion stretch with belt 5 x 20 seconds each side  02464: Showed Jwan's wife where to find a bike seat cushion/cover on [employer]; discussed the importance of continuing consistent HEP participation to improve flexibility of VERY tight heel cords and improve strength of weak ankles   12/31/24 Ankle DF with TB green 2x10 SAQ 2x10 #1 Seated gastroc strap stretch 3x25 sec Seated hip abduction TB  2x10 green Seated LAQ 2x10 Standing gastroc stretch 3x25 sec with CGA   Access Code: [email or web address] URL: [email or web address] Date: 12/31/2024 Prepared by: Burnard Meth  Exercises - Long Sitting Calf Stretch with Strap  - 2 x daily - 7 x weekly - 1 sets - 3 reps - 25 hold - Seated Gastroc Stretch with Strap  - 2 x daily - 7 x weekly - 1 sets - 3 reps - 25 hold  12/25/2024  Upper heel cord box 2 x 1 minute (knees straight, slight toe in) Lower heel cord box 1 minute (knees bent, slight toe in) Heel Raises 2 sets of 10, slow eccentrics  97535: Showed Kaelem's wife where to find the box on [employer]; discussed the importance of consistent HEP participation  to improve flexibility of VERY tight heel cords and improve strength of weak ankles   PATIENT EDUCATION:  Education details: See above Person educated: Patient and Spouse Education method: Explanation, Demonstration, Tactile cues, Verbal cues, and Handouts Education comprehension: verbalized understanding, returned demonstration,  verbal cues required, tactile cues required, and needs further education  HOME EXERCISE PROGRAM: Access Code: [email or web address] URL: [email or web address] Date: 12/25/2024 Prepared by: Lamar Ivory  Exercises - Slant Board Gastrocnemius Stretch  - 2-3 x daily - 7 x weekly - 1 sets - 4-5 reps - 60 seconds hold - Standing Heel Raises  - 3-5 x daily - 7 x weekly - 1 sets - 10 reps - 3 seconds hold   ASSESSMENT:  CLINICAL IMPRESSION: Vir demonstrates increased flexibility compared to IE but still requires skilled PT services to address the restrictions.   OBJECTIVE IMPAIRMENTS: Abnormal gait, cardiopulmonary status limiting activity, decreased activity tolerance, decreased endurance, decreased knowledge of condition, decreased mobility, difficulty walking, decreased ROM, decreased strength, impaired perceived functional ability, impaired flexibility, postural dysfunction, and pain.   ACTIVITY LIMITATIONS: sleeping, stairs, and locomotion level  PARTICIPATION LIMITATIONS: community activity  PERSONAL FACTORS: OA, Rt leg DVT, 4 previous lumbar surgeries, Rt RTC repair, spinal cord stimulator are also affecting patient's functional outcome.   REHAB POTENTIAL: Good  CLINICAL DECISION MAKING: Evolving/moderate complexity  EVALUATION COMPLEXITY: Moderate   GOALS: Goals reviewed with patient? Yes  SHORT TERM GOALS: Target date: 01/22/2025 Vearl will be independent with his day 1 HEP Baseline: Started 12/25/2024 Goal status: Ongoing 01/07/2025  2.  Improve bilateral heel cords flexibility to neutral (0*) Baseline: -6/-12 degrees left/right Goal status: Ongoing 01/07/2025  3.  Improve bilateral ankle strength as assessed by hand-held dynamometer Baseline: Inversion 6.0/5.8 pounds and eversion 4.8/15.9 pounds Goal status: INITIAL  LONG TERM GOALS: Target date: 02/19/2025  Improve PSFS to 9 Baseline: 6 Goal status: INITIAL  2.  Jeziah will be able to sleep without being  awakened by pain between 2-3 AM Baseline: Unable at evaluation Goal status: INITIAL  3.  Shyheem will report left foot pain consistently 0-2/10 on the NPRS Baseline: Can be 10/10 Goal status: INITIAL  4.  Improve bilateral heel cords flexibility to 10 degrees Baseline: -6/-12 Goal status: INITIAL  5.  Improve bilateral ankle strength to 30+ pounds for inversion and eversion Baseline: Inversion 6.0/5.8 pounds and eversion 4.8/15.9 pounds Goal status: INITIAL  6.  Tallen will be independent with his long-term HEP at DC Baseline: Started 12/25/2024 Goal status: INITIAL   PLAN:  PT FREQUENCY: 1-2x/week  PT DURATION: 8 weeks  PLANNED INTERVENTIONS: 97750- Physical Performance Testing, 97110-Therapeutic exercises, 97530- Therapeutic activity, [id number]- Neuromuscular re-education, 97535- Self Care, [id number]- Manual therapy, [id number]- Gait training, [id number] (1-2 muscles), [id number] (3+ muscles)- Dry Needling, Patient/Family education, Balance training, Stair training, and Cryotherapy  PLAN FOR NEXT SESSION: Heel cords stretching and calf/ankle strength to reduce plantar fasciitis like symptoms.   Burnard CHRISTELLA Meth, PT, MPT 01/09/2025, 1:40 PM  "

## 2025-01-13 ENCOUNTER — Ambulatory Visit: Admitting: Physician Assistant

## 2025-01-15 ENCOUNTER — Encounter: Admitting: Rehabilitative and Restorative Service Providers"

## 2025-01-17 ENCOUNTER — Encounter: Admitting: Rehabilitative and Restorative Service Providers"

## 2025-01-20 ENCOUNTER — Encounter

## 2025-01-23 ENCOUNTER — Encounter: Admitting: Rehabilitative and Restorative Service Providers"

## 2025-01-29 ENCOUNTER — Encounter: Admitting: Rehabilitative and Restorative Service Providers"

## 2025-01-31 ENCOUNTER — Encounter: Admitting: Rehabilitative and Restorative Service Providers"

## 2025-08-28 ENCOUNTER — Encounter (INDEPENDENT_AMBULATORY_CARE_PROVIDER_SITE_OTHER): Admitting: Ophthalmology
